# Patient Record
Sex: Male | Born: 1945
Health system: Southern US, Community
[De-identification: ages and names within clinical notes are randomized; demographics above are authoritative.]

## PROBLEM LIST (undated history)

## (undated) DIAGNOSIS — H269 Unspecified cataract: Secondary | ICD-10-CM

## (undated) DIAGNOSIS — E785 Hyperlipidemia, unspecified: Secondary | ICD-10-CM

## (undated) DIAGNOSIS — I10 Essential (primary) hypertension: Secondary | ICD-10-CM

## (undated) DIAGNOSIS — K648 Other hemorrhoids: Secondary | ICD-10-CM

## (undated) DIAGNOSIS — M1990 Unspecified osteoarthritis, unspecified site: Secondary | ICD-10-CM

## (undated) DIAGNOSIS — D126 Benign neoplasm of colon, unspecified: Secondary | ICD-10-CM

## (undated) DIAGNOSIS — E119 Type 2 diabetes mellitus without complications: Secondary | ICD-10-CM

## (undated) DIAGNOSIS — E559 Vitamin D deficiency, unspecified: Secondary | ICD-10-CM

## (undated) DIAGNOSIS — K219 Gastro-esophageal reflux disease without esophagitis: Secondary | ICD-10-CM

## (undated) DIAGNOSIS — Z8673 Personal history of transient ischemic attack (TIA), and cerebral infarction without residual deficits: Secondary | ICD-10-CM

## (undated) HISTORY — PX: UPPER GASTROINTESTINAL ENDOSCOPY: SHX188

## (undated) HISTORY — DX: Other hemorrhoids: K64.8

## (undated) HISTORY — DX: Type 2 diabetes mellitus without complications: E11.9

## (undated) HISTORY — DX: Essential (primary) hypertension: I10

## (undated) HISTORY — DX: Hyperlipidemia, unspecified: E78.5

## (undated) HISTORY — DX: Personal history of transient ischemic attack (TIA), and cerebral infarction without residual deficits: Z86.73

## (undated) HISTORY — PX: CATARACT EXTRACTION, BILATERAL: SHX1313

## (undated) HISTORY — DX: Vitamin D deficiency, unspecified: E55.9

## (undated) HISTORY — DX: Unspecified osteoarthritis, unspecified site: M19.90

## (undated) HISTORY — PX: WISDOM TOOTH EXTRACTION: SHX21

## (undated) HISTORY — DX: Unspecified cataract: H26.9

## (undated) HISTORY — DX: Gastro-esophageal reflux disease without esophagitis: K21.9

## (undated) HISTORY — DX: Benign neoplasm of colon, unspecified: D12.6

## (undated) HISTORY — PX: POLYPECTOMY: SHX149

---

## 1976-08-11 HISTORY — PX: HEMORRHOID SURGERY: SHX153

## 2006-12-18 ENCOUNTER — Ambulatory Visit: Payer: Self-pay | Admitting: Critical Care Medicine

## 2007-01-05 ENCOUNTER — Ambulatory Visit: Payer: Self-pay

## 2007-08-12 DIAGNOSIS — D126 Benign neoplasm of colon, unspecified: Secondary | ICD-10-CM

## 2007-08-12 HISTORY — DX: Benign neoplasm of colon, unspecified: D12.6

## 2008-03-17 ENCOUNTER — Ambulatory Visit: Payer: Self-pay | Admitting: Gastroenterology

## 2008-03-31 ENCOUNTER — Encounter: Payer: Self-pay | Admitting: Gastroenterology

## 2008-03-31 ENCOUNTER — Ambulatory Visit: Payer: Self-pay | Admitting: Gastroenterology

## 2008-04-03 ENCOUNTER — Encounter: Payer: Self-pay | Admitting: Gastroenterology

## 2009-05-07 ENCOUNTER — Ambulatory Visit (HOSPITAL_COMMUNITY): Admission: RE | Admit: 2009-05-07 | Discharge: 2009-05-07 | Payer: Self-pay | Admitting: General Surgery

## 2009-05-07 ENCOUNTER — Encounter (INDEPENDENT_AMBULATORY_CARE_PROVIDER_SITE_OTHER): Payer: Self-pay | Admitting: General Surgery

## 2009-07-25 LAB — HM COLONOSCOPY: HM Colonoscopy: NORMAL

## 2010-02-05 ENCOUNTER — Encounter (INDEPENDENT_AMBULATORY_CARE_PROVIDER_SITE_OTHER): Payer: Self-pay | Admitting: *Deleted

## 2010-07-18 ENCOUNTER — Encounter
Admission: RE | Admit: 2010-07-18 | Discharge: 2010-07-18 | Payer: Self-pay | Source: Home / Self Care | Attending: Family Medicine | Admitting: Family Medicine

## 2010-09-01 ENCOUNTER — Encounter: Payer: Self-pay | Admitting: Critical Care Medicine

## 2010-09-10 NOTE — Letter (Signed)
Summary: Colonoscopy Letter  [facility]  [address] [place], [place] [address]   Phone: [phone]  Fax: [phone]      February 05, 2010 MRN: [id number]   GIOVONI BUNCH [address] [place], [place]  [address]   Dear Mr. Sellen,   According to your medical record, it is time for you to schedule a Colonoscopy. The American Cancer Society recommends this procedure as a method to detect early colon cancer. Patients with a family history of colon cancer, or a personal history of colon polyps or inflammatory bowel disease are at increased risk.  This letter has beeen generated based on the recommendations made at the time of your procedure. If you feel that in your particular situation this may no longer apply, please contact our office.  Please call our office at [phone] to schedule this appointment or to update your records at your earliest convenience.  Thank you for cooperating with [place] to provide you with the very best care possible.   Sincerely,  Judie Petit T. Russella Dar, M.D.  [facility] [phone]

## 2010-11-14 ENCOUNTER — Encounter: Payer: Self-pay | Admitting: Nurse Practitioner

## 2010-11-14 DIAGNOSIS — I10 Essential (primary) hypertension: Secondary | ICD-10-CM | POA: Insufficient documentation

## 2010-11-14 DIAGNOSIS — E785 Hyperlipidemia, unspecified: Secondary | ICD-10-CM | POA: Insufficient documentation

## 2010-11-14 DIAGNOSIS — E1169 Type 2 diabetes mellitus with other specified complication: Secondary | ICD-10-CM | POA: Insufficient documentation

## 2010-11-15 LAB — DIFFERENTIAL
Basophils Absolute: 0 K/uL (ref 0.0–0.1)
Basophils Relative: 1 % (ref 0–1)
Eosinophils Absolute: 0.4 K/uL (ref 0.0–0.7)
Eosinophils Relative: 4 % (ref 0–5)
Lymphocytes Relative: 22 % (ref 12–46)
Lymphs Abs: 2 K/uL (ref 0.7–4.0)
Monocytes Absolute: 0.6 K/uL (ref 0.1–1.0)
Monocytes Relative: 7 % (ref 3–12)
Neutro Abs: 6 K/uL (ref 1.7–7.7)
Neutrophils Relative %: 67 % (ref 43–77)

## 2010-11-15 LAB — COMPREHENSIVE METABOLIC PANEL
Alkaline Phosphatase: 66 U/L (ref 39–117)
BUN: 12 mg/dL (ref 6–23)
GFR calc non Af Amer: >60 mL/min (ref >60)
Glucose, Bld: 125 mg/dL — ABNORMAL HIGH (ref 70–99)
Potassium: 4.1 mEq/L (ref 3.5–5.1)
Total Protein: 7.6 g/dL (ref 6.0–8.3)

## 2010-11-15 LAB — URINALYSIS, ROUTINE W REFLEX MICROSCOPIC
Nitrite: NEGATIVE
Specific Gravity, Urine: 1.01 (ref 1.005–1.030)
Urobilinogen, UA: 0.2 mg/dL (ref 0.0–1.0)

## 2010-11-15 LAB — CBC
HCT: 48.4 % (ref 39.0–52.0)
Hemoglobin: 16.8 g/dL (ref 13.0–17.0)
MCHC: 34.6 g/dL (ref 30.0–36.0)
RDW: 14.1 % (ref 11.5–15.5)

## 2010-12-24 NOTE — Assessment & Plan Note (Signed)
[facility]                             PULMONARY OFFICE NOTE   Jorge, Garcia                        MRN:          [id number]  DATE:12/18/2006                            DOB:          08-10-1946    CHIEF COMPLAINT:  Evaluate abnormal x-ray.   HISTORY OF PRESENT ILLNESS:  This is a 65 year old male on routine  physical found to have abnormal chest x-ray with dilated central  pulmonary arteries. He complains of dyspnea when he bends over and when  he walks uphill. He has had a 15 pound weight gain. There is no cough,  no acid symptoms, and no chest pain. He has had no nasal congestion,  sneezing, or itching. He is short of breath only going uphill. He works  as a Contractor at [employer]. He denies any  lower extremity edema. He has no daytime hypersomnolence or fatigue. He  is short of breath with exertion only, not at rest. There is no cough.  There is no irregular heartbeats, acid reflux, indigestion, or loss of  appetite. The patient denies any sore throat, headaches, nasal  congestion, sneezing, itching, ear ache, anxiety, depression, hand or  foot swelling. The patient is referred for further evaluation.   PAST MEDICAL HISTORY:  History of hypertension, hypercholesterolemia,  otherwise non-contributory. No surgical history is noted.   MEDICATION ALLERGIES:  None.   CURRENT MEDICATIONS:  1. Lotrel 5/20 daily.  2. Hydrochlorothiazide 25 mg daily.  3. Niacin daily.  4. Aspirin 81 mg daily.   SOCIAL HISTORY:  He smoked a pack a day for 40 years. He quit smoking in  2007. He lives at home with himself. 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   FAMILY HISTORY:  Paternal uncles both had cancer.   REVIEW OF SYSTEMS:  Otherwise non-contributory.   PHYSICAL EXAMINATION:  GENERAL:  This is an obese male in no distress.  VITAL SIGNS:  Temperature 97.9, weight 235 pounds, blood pressure  132/84,  pulse 83, saturation 93% on room air.  CHEST:  Clear without evidence of wheeze, rale, or rhonchi.  CARDIAC:  Regular rate and rhythm without S3, normal S1, S2.  ABDOMEN:  Soft and nontender.  EXTREMITIES:  No edema, clubbing, or venous disease.  SKIN:  Clear.  NEUROLOGIC:  Intact.  HEENT:  Showed no jugular venous distension or lymphadenopathy.   LABORATORY DATA:  Spirometry was obtained and it showed normal  spirometry with a FEV1 140% predicted, FVC of 126% predicted. Chest x-  ray was reviewed from [facility] did show  prominent right central pulmonary artery, however CAT scan of the chest  with contrast obtained today with pulmonary embolism protocol revealed  no evidence of pulmonary emboli and did not reveal evidence for  significant pulmonary hypertension.   IMPRESSION:  Prominent pulmonary arteries doubt significant given  negative CAT scan of the chest.   PLAN:  Obtain echocardiogram. Once the results of this are available,  further recommendations would follow.     Charlcie Cradle Delford Field, MD, FCCP  Electronically Signed    PEW/MedQ  DD: 12/20/2006  DT: 12/21/2006  Job #: [id number]   cc:   Gennette Pac

## 2012-04-14 ENCOUNTER — Encounter: Payer: Self-pay | Admitting: Gastroenterology

## 2012-10-26 ENCOUNTER — Encounter: Payer: Self-pay | Admitting: Family Medicine

## 2012-10-26 ENCOUNTER — Telehealth: Payer: Self-pay

## 2012-10-26 NOTE — Telephone Encounter (Signed)
Jorge Garcia needs a note to go back to work

## 2012-10-26 NOTE — Telephone Encounter (Signed)
Ok to give note 

## 2012-10-26 NOTE — Telephone Encounter (Signed)
Pt called and note up front to pick up

## 2012-10-27 ENCOUNTER — Telehealth: Payer: Self-pay | Admitting: Nurse Practitioner

## 2012-10-27 NOTE — Telephone Encounter (Signed)
Needs work note to go back to work without rrestrictions Monday

## 2012-10-28 ENCOUNTER — Encounter: Payer: Self-pay | Admitting: Nurse Practitioner

## 2012-11-18 ENCOUNTER — Ambulatory Visit (INDEPENDENT_AMBULATORY_CARE_PROVIDER_SITE_OTHER): Payer: BC Managed Care – PPO | Admitting: Nurse Practitioner

## 2012-11-18 ENCOUNTER — Encounter: Payer: Self-pay | Admitting: Nurse Practitioner

## 2012-11-18 VITALS — BP 128/78 | HR 61 | Temp 97.6°F | Ht 73.0 in | Wt 230.0 lb

## 2012-11-18 DIAGNOSIS — E559 Vitamin D deficiency, unspecified: Secondary | ICD-10-CM

## 2012-11-18 DIAGNOSIS — E119 Type 2 diabetes mellitus without complications: Secondary | ICD-10-CM

## 2012-11-18 DIAGNOSIS — E785 Hyperlipidemia, unspecified: Secondary | ICD-10-CM

## 2012-11-18 LAB — BASIC METABOLIC PANEL WITH GFR
Calcium: 9.3 mg/dL (ref 8.4–10.5)
Creat: 0.9 mg/dL (ref 0.50–1.35)
GFR, Est African American: >89 mL/min
Sodium: 139 mEq/L (ref 135–145)

## 2012-11-18 LAB — HEPATIC FUNCTION PANEL
Albumin: 4.3 g/dL (ref 3.5–5.2)
Bilirubin, Direct: 0.4 mg/dL — ABNORMAL HIGH (ref 0.0–0.3)
Total Bilirubin: 1.7 mg/dL — ABNORMAL HIGH (ref 0.3–1.2)

## 2012-11-18 LAB — POCT GLYCOSYLATED HEMOGLOBIN (HGB A1C): Hemoglobin A1C: 6.1

## 2012-11-18 NOTE — Progress Notes (Signed)
Subjective:    Patient ID: Jorge Garcia, male    DOB: 1946-06-27, 67 y.o.   MRN: [id number]  Hypertension This is a chronic problem. The current episode started more than 1 year ago. The problem is unchanged. The problem is controlled. Pertinent negatives include no blurred vision, chest pain, headaches, malaise/fatigue, peripheral edema or shortness of breath. There are no associated agents to hypertension. Risk factors for coronary artery disease include diabetes mellitus, dyslipidemia and male gender. Past treatments include ACE inhibitors and calcium channel blockers. The current treatment provides significant improvement. There are no compliance problems.  There is no history of angina.  Hyperlipidemia This is a chronic problem. The current episode started more than 1 year ago. The problem is controlled. Recent lipid tests were reviewed and are high. Exacerbating diseases include diabetes. There are no known factors aggravating his hyperlipidemia. Pertinent negatives include no chest pain, focal weakness, leg pain, myalgias or shortness of breath. Current antihyperlipidemic treatment includes statins. The current treatment provides moderate improvement of lipids. Compliance problems include adherence to diet and adherence to exercise.  Risk factors for coronary artery disease include diabetes mellitus and hypertension.  Diabetes He presents for his follow-up diabetic visit. He has type 2 diabetes mellitus. No MedicAlert identification noted. The initial diagnosis of diabetes was made 1 year ago. His disease course has been stable. Pertinent negatives for hypoglycemia include no headaches. Associated symptoms include foot ulcerations. Pertinent negatives for diabetes include no blurred vision, no chest pain, no foot paresthesias, no polydipsia, no polyphagia, no polyuria and no weakness. Symptoms are stable. Pertinent negatives for diabetic complications include no nephropathy. Risk factors for  coronary artery disease include dyslipidemia, hypertension and male sex. Current diabetic treatment includes oral agent (monotherapy). He is compliant with treatment all of the time. His weight is stable. He is following a diabetic diet. When asked about meal planning, he reported none. He has not had a previous visit with a dietician. He participates in exercise intermittently. There is no change in his home blood glucose trend. His breakfast blood glucose is taken between 8-9 am. His breakfast blood glucose range is generally 110-130 mg/dl. His overall blood glucose range is 110-130 mg/dl. An ACE inhibitor/angiotensin II receptor blocker is being taken. He does not see a podiatrist.Eye exam is current (October, 2013).      Review of Systems  Constitutional: Negative for malaise/fatigue.  Eyes: Negative for blurred vision.  Respiratory: Negative for shortness of breath.   Cardiovascular: Negative for chest pain.  Endocrine: Negative for polydipsia, polyphagia and polyuria.  Musculoskeletal: Negative for myalgias.  Neurological: Negative for focal weakness, weakness and headaches.  All other systems reviewed and are negative.       Objective:   Physical Exam  Constitutional: He is oriented to person, place, and time. He appears well-developed and well-nourished.  HENT:  Head: Normocephalic.  Right Ear: External ear normal.  Left Ear: External ear normal.  Nose: Nose normal.  Mouth/Throat: Oropharynx is clear and moist.  Eyes: EOM are normal. Pupils are equal, round, and reactive to light.  Neck: Normal range of motion. Neck supple. No thyromegaly present.  Cardiovascular: Normal rate, regular rhythm, normal heart sounds and intact distal pulses.   No murmur heard. Pulmonary/Chest: Effort normal and breath sounds normal. He has no wheezes. He has no rales.  Abdominal: Soft. Bowel sounds are normal.  Musculoskeletal: Normal range of motion.  Neurological: He is alert and oriented to  person, place, and time.  (+) 4/4 monofilament  bil  Skin: Skin is warm and dry.  No callus formation bil feet  Psychiatric: He has a normal mood and affect. His behavior is normal. Judgment and thought content normal.  BP 128/78  Pulse 61  Temp(Src) 97.6 F (36.4 C) (Oral)  Ht 6\' 1"  (1.854 m)  Wt 230 lb (104.327 kg)  BMI 30.35 kg/m2 Results for orders placed in visit on 11/18/12  POCT GLYCOSYLATED HEMOGLOBIN (HGB A1C)      Result Value Range   Hemoglobin A1C 6.1             Assessment & Plan:  1.Type II or unspecified type diabetes mellitus without mention of complication, not stated as uncontrolled Coninue low carb diet - POCT glycosylated hemoglobin (Hb A1C)  2. Unspecified vitamin D deficiency  - Vitamin D 25 hydroxy  3. Other and unspecified hyperlipidemia Low fat diet and exercise - BASIC METABOLIC PANEL WITH GFR - Hepatic function panel - NMR Lipoprofile with Lipids  Continue all meds Health Maintenance reviewed Mary-Margaret Daphine Deutscher, FNP

## 2012-11-18 NOTE — Patient Instructions (Signed)
Diets for Diabetes, Food Labeling Look at food labels to help you decide how much of a product you can eat. You will want to check the amount of total carbohydrate in a serving to see how the food fits into your meal plan. In the list of ingredients, the ingredient present in the largest amount by weight must be listed first, followed by the other ingredients in descending order. STANDARD OF IDENTITY Most products have a list of ingredients. However, foods that the Food and Drug Administration (FDA) has given a standard of identity do not need a list of ingredients. A standard of identity means that a food must contain certain ingredients if it is called a particular name. Examples are mayonnaise, peanut butter, ketchup, jelly, and cheese. LABELING TERMS There are many terms found on food labels. Some of these terms have specific definitions. Some terms are regulated by the FDA, and the FDA has clearly specified how they can be used. Others are not regulated or well-defined and can be misleading and confusing. SPECIFICALLY DEFINED TERMS Nutritive Sweetener.  A sweetener that contains calories,such as table sugar or honey. Nonnutritive Sweetener.  A sweetener with few or no calories,such as saccharin, aspartame, sucralose, and cyclamate. LABELING TERMS REGULATED BY THE FDA Free.  The product contains only a tiny or small amount of fat, cholesterol, sodium, sugar, or calories. For example, a "fat-free" product will contain less than 0.5 g of fat per serving. Low.  A food described as "low" in fat, saturated fat, cholesterol, sodium, or calories could be eaten fairly often without exceeding dietary guidelines. For example, "low in fat" means no more than 3 g of fat per serving. Lean.  "Lean" and "extra lean" are U.S. Department of Agriculture (USDA) terms for use on meat and poultry products. "Lean" means the product contains less than 10 g of fat, 4 g of saturated fat, and 95 mg of cholesterol  per serving. "Lean" is not as low in fat as a product labeled "low." Extra Lean.  "Extra lean" means the product contains less than 5 g of fat, 2 g of saturated fat, and 95 mg of cholesterol per serving. While "extra lean" has less fat than "lean," it is still higher in fat than a product labeled "low." Reduced, Less, Fewer.  A diet product that contains 25% less of a nutrient or calories than the regular version. For example, hot dogs might be labeled "25% less fat than our regular hot dogs." Light/Lite.  A diet product that contains  fewer calories or  the fat of the original. For example, "light in sodium" means a product with  the usual sodium. More.  One serving contains at least 10% more of the daily value of a vitamin, mineral, or fiber than usual. Good Source Of.  One serving contains 10% to 19% of the daily value for a particular vitamin, mineral, or fiber. Excellent Source Of.  One serving contains 20% or more of the daily value for a particular nutrient. Other terms used might be "high in" or "rich in." Enriched or Fortified.  The product contains added vitamins, minerals, or protein. Nutrition labeling must be used on enriched or fortified foods. Imitation.  The product has been altered so that it is lower in protein, vitamins, or minerals than the usual food,such as imitation peanut butter. Total Fat.  The number listed is the total of all fat found in a serving of the product. Under total fat, food labels must list saturated fat and   trans fat, which are associated with raising bad cholesterol and an increased risk of heart blood vessel disease. Saturated Fat.  Mainly fats from animal-based sources. Some examples are red meat, cheese, cream, whole milk, and coconut oil. Trans Fat.  Found in some fried snack foods, packaged foods, and fried restaurant foods. It is recommended you eat as close to 0 g of trans fat as possible, since it raises bad cholesterol and lowers  good cholesterol. Polyunsaturated and Monounsaturated Fats.  More healthful fats. These fats are from plant sources. Total Carbohydrate.  The number of carbohydrate grams in a serving of the product. Under total carbohydrate are listed the other carbohydrate sources, such as dietary fiber and sugars. Dietary Fiber.  A carbohydrate from plant sources. Sugars.  Sugars listed on the label contain all naturally occurring sugars as well as added sugars. LABELING TERMS NOT REGULATED BY THE FDA Sugarless.  Table sugar (sucrose) has not been added. However, the manufacturer may use another form of sugar in place of sucrose to sweeten the product. For example, sugar alcohols are used to sweeten foods. Sugar alcohols are a form of sugar but are not table sugar. If a product contains sugar alcohols in place of sucrose, it can still be labeled "sugarless." Low Salt, Salt-Free, Unsalted, No Salt, No Salt Added, Without Added Salt.  Food that is usually processed with salt has been made without salt. However, the food may contain sodium-containing additives, such as preservatives, leavening agents, or flavorings. Natural.  This term has no legal meaning. Organic.  Foods that are certified as organic have been inspected and approved by the USDA to ensure they are produced without pesticides, fertilizers containing synthetic ingredients, bioengineering, or ionizing radiation. Document Released: 07/31/2003 Document Revised: 10/20/2011 Document Reviewed: 02/15/2009 Exit[employer] Patient Information 2013 [employer].  

## 2012-11-19 LAB — NMR LIPOPROFILE WITH LIPIDS
Cholesterol, Total: 110 mg/dL (ref <200)
HDL Size: 8.3 nm — ABNORMAL LOW (ref >=9.2)
LDL (calc): 57 mg/dL (ref <100)
LDL Particle Number: 995 nmol/L (ref <1000)
LP-IR Score: 53 — ABNORMAL HIGH (ref <=45)
Small LDL Particle Number: 683 nmol/L — ABNORMAL HIGH (ref <=527)
Triglycerides: 52 mg/dL (ref <150)
VLDL Size: 46 nm (ref <=46.6)

## 2013-02-18 ENCOUNTER — Encounter: Payer: Self-pay | Admitting: Nurse Practitioner

## 2013-02-18 ENCOUNTER — Ambulatory Visit (INDEPENDENT_AMBULATORY_CARE_PROVIDER_SITE_OTHER): Payer: Medicare Other

## 2013-02-18 ENCOUNTER — Ambulatory Visit (INDEPENDENT_AMBULATORY_CARE_PROVIDER_SITE_OTHER): Payer: Medicare Other | Admitting: Nurse Practitioner

## 2013-02-18 VITALS — BP 137/79 | HR 68 | Temp 97.1°F | Ht 73.0 in | Wt 226.0 lb

## 2013-02-18 DIAGNOSIS — M7730 Calcaneal spur, unspecified foot: Secondary | ICD-10-CM

## 2013-02-18 DIAGNOSIS — M7732 Calcaneal spur, left foot: Secondary | ICD-10-CM

## 2013-02-18 DIAGNOSIS — E119 Type 2 diabetes mellitus without complications: Secondary | ICD-10-CM

## 2013-02-18 DIAGNOSIS — M79609 Pain in unspecified limb: Secondary | ICD-10-CM

## 2013-02-18 DIAGNOSIS — E785 Hyperlipidemia, unspecified: Secondary | ICD-10-CM

## 2013-02-18 DIAGNOSIS — M79672 Pain in left foot: Secondary | ICD-10-CM

## 2013-02-18 DIAGNOSIS — I10 Essential (primary) hypertension: Secondary | ICD-10-CM

## 2013-02-18 LAB — POCT UA - MICROALBUMIN: Microalbumin Ur, POC: NEGATIVE mg/L

## 2013-02-18 LAB — COMPLETE METABOLIC PANEL WITH GFR
Alkaline Phosphatase: 58 U/L (ref 39–117)
CO2: 26 mEq/L (ref 19–32)
Creat: 1.05 mg/dL (ref 0.50–1.35)
GFR, Est African American: 86 mL/min
GFR, Est Non African American: 75 mL/min
Glucose, Bld: 87 mg/dL (ref 70–99)
Sodium: 141 mEq/L (ref 135–145)
Total Bilirubin: 2.4 mg/dL — ABNORMAL HIGH (ref 0.3–1.2)
Total Protein: 6.9 g/dL (ref 6.0–8.3)

## 2013-02-18 LAB — POCT GLYCOSYLATED HEMOGLOBIN (HGB A1C): Hemoglobin A1C: 5.8

## 2013-02-18 MED ORDER — BUPIVACAINE HCL 0.5 % IJ SOLN
50.0000 mL | Freq: Once | INTRAMUSCULAR | Status: AC
  Administered 2013-02-18: 50 mL

## 2013-02-18 MED ORDER — METFORMIN HCL 500 MG PO TABS: 500.0000 mg | ORAL_TABLET | Freq: Two times a day (BID) | ORAL | Status: DC

## 2013-02-18 MED ORDER — METHYLPREDNISOLONE ACETATE 40 MG/ML IJ SUSP
40.0000 mg | Freq: Once | INTRAMUSCULAR | Status: AC
  Administered 2013-02-18: 40 mg via INTRALESIONAL

## 2013-02-18 MED ORDER — ROSUVASTATIN CALCIUM 10 MG PO TABS: 10.0000 mg | ORAL_TABLET | Freq: Every day | ORAL | Status: DC

## 2013-02-18 MED ORDER — HYDROCHLOROTHIAZIDE 25 MG PO TABS: 25.0000 mg | ORAL_TABLET | Freq: Every day | ORAL | Status: DC

## 2013-02-18 NOTE — Patient Instructions (Signed)
Heel Spur A heel spur is a hook of bone that can form on the calcaneus (the heel bone and the largest bone of the foot). Heel spurs are often associated with plantar fasciitis and usually come in people who have had the problem for an extended period of time. The cause of the relationship is unknown. The pain associated with them is thought to be caused by an inflammation (soreness and redness) of the plantar fascia rather than the spur itself. The plantar fascia is a thick fibrous like tissue that runs from the calcaneus (heel bone) to the ball of the foot. This strong, tight tissue helps maintain the arch of your foot. It helps distribute the weight across your foot as you walk or run. Stresses placed on the plantar fascia can be tremendous. When it is inflamed normal activities become painful. Pain is worse in the morning after sleeping. After sleeping the plantar fascia is tight. The first movements stretch the fascia and this causes pain. As the tendon loosens, the pain usually gets better. It often returns with too much standing or walking.  About 70% of patients with plantar fasciitis have a heel spur. About half of people without foot pain also have heel spurs. DIAGNOSIS  The diagnosis of a heel spur is made by X-ray. The X-ray shows a hook of bone protruding from the bottom of the calcaneus at the point where the plantar fascia is attached to the heel bone.  TREATMENT  It is necessary to find out what is causing the stretching of the plantar fascia. If the cause is over-pronation (flat feet), orthotics and proper foot ware may help.  Stretching exercises, losing weight, wearing shoes that have a cushioned heel that absorbs shock, and elevating the heel with the use of a heel cradle, heel cup, or orthotics may all help. Heel cradles and heel cups provide extra comfort and cushion to the heel, and reduce the amount of shock to the sore area. AVOIDING THE PAIN OF PLANTAR FASCIITIS AND HEEL  SPURS  Consult a sports medicine professional before beginning a new exercise program.  Walking programs offer a good workout. There is a lower chance of overuse injuries common to the runners. There is less impact and less jarring of the joints.  Begin all new exercise programs slowly. If problems or pains develop, decrease the amount of time or distance until you are at a comfortable level.  Wear good shoes and replace them regularly.  Stretch your foot and the heel cords at the back of the ankle (Achilles tendons) both before and after exercise.  Run or exercise on even surfaces that are not hard. For example, asphalt is better than pavement.  Do not run barefoot on hard surfaces.  If using a treadmill, vary the incline.  Do not continue to workout if you have foot or joint problems. Seek professional help if they do not improve. HOME CARE INSTRUCTIONS   Avoid activities that cause you pain until you recover.  Use ice or cold packs to the problem or painful areas after working out.  Only take over-the-counter or prescription medicines for pain, discomfort, or fever as directed by your caregiver.  Soft shoe inserts or athletic shoes with air or gel sole cushions may be helpful.  If problems continue or become more severe, consult a sports medicine caregiver. Cortisone is a potent anti-inflammatory medication that may be injected into the painful area. You can discuss this treatment with your caregiver. MAKE SURE YOU:     Understand these instructions.  Will watch your condition.  Will get help right away if you are not doing well or get worse. Document Released: 09/03/2005 Document Revised: 10/20/2011 Document Reviewed: 11/05/2005 Exit[employer] Patient Information 2014 [employer].  

## 2013-02-18 NOTE — Progress Notes (Signed)
Subjective:    Patient ID: Jorge Garcia, male    DOB: 1945/12/25, 67 y.o.   MRN: [id number]  Hypertension This is a chronic problem. The current episode started more than 1 year ago. The problem is unchanged. The problem is controlled. Pertinent negatives include no blurred vision, chest pain, headaches, neck pain, palpitations, peripheral edema, shortness of breath or sweats. There are no associated agents to hypertension. Risk factors for coronary artery disease include diabetes mellitus, dyslipidemia and male gender. Past treatments include ACE inhibitors, calcium channel blockers and diuretics. The current treatment provides significant improvement. Compliance problems include diet and exercise.  There is no history of CAD/MI or CVA.  Hyperlipidemia This is a recurrent problem. The current episode started more than 1 year ago. The problem is controlled. Recent lipid tests were reviewed and are low. Exacerbating diseases include diabetes. He has no history of hypothyroidism, liver disease or obesity. Factors aggravating his hyperlipidemia include thiazides. Pertinent negatives include no chest pain or shortness of breath. Current antihyperlipidemic treatment includes statins. The current treatment provides moderate improvement of lipids. Compliance problems include adherence to diet and adherence to exercise.  Risk factors for coronary artery disease include diabetes mellitus, hypertension and male sex.  Diabetes He presents for his follow-up diabetic visit. He has type 2 diabetes mellitus. No MedicAlert identification noted. The initial diagnosis of diabetes was made 2 years ago. His disease course has been fluctuating. There are no hypoglycemic associated symptoms. Pertinent negatives for hypoglycemia include no headaches or sweats. Pertinent negatives for diabetes include no blurred vision and no chest pain. Symptoms are improving. Pertinent negatives for diabetic complications include no CVA. Risk  factors for coronary artery disease include dyslipidemia and hypertension. When asked about current treatments, none were reported. He is compliant with treatment most of the time. His weight is stable. When asked about meal planning, he reported none. He has not had a previous visit with a dietician. He rarely participates in exercise. There is no change in his home blood glucose trend. His breakfast blood glucose is taken between 8-9 am. His breakfast blood glucose range is generally 110-130 mg/dl. His overall blood glucose range is 110-130 mg/dl. An ACE inhibitor/angiotensin II receptor blocker is not being taken. He does not see a podiatrist.Eye exam is current.    8 C/O left heel pian- has history of heel spur right heel  Review of Systems  HENT: Negative for neck pain.   Eyes: Negative for blurred vision.  Respiratory: Negative for shortness of breath.   Cardiovascular: Negative for chest pain and palpitations.  Neurological: Negative for headaches.  All other systems reviewed and are negative.       Objective:   Physical Exam  Constitutional: He is oriented to person, place, and time. He appears well-developed and well-nourished.  HENT:  Head: Normocephalic.  Right Ear: External ear normal.  Left Ear: External ear normal.  Nose: Nose normal.  Mouth/Throat: Oropharynx is clear and moist.  Eyes: EOM are normal. Pupils are equal, round, and reactive to light.  Neck: Normal range of motion. Neck supple. No thyromegaly present.  Cardiovascular: Normal rate, regular rhythm, normal heart sounds and intact distal pulses.   No murmur heard. Pulmonary/Chest: Effort normal and breath sounds normal. He has no wheezes. He has no rales.  Abdominal: Soft. Bowel sounds are normal.  Musculoskeletal: Normal range of motion.  Neurological: He is alert and oriented to person, place, and time.  Skin: Skin is warm and dry.  Psychiatric: He has  a normal mood and affect. His behavior is normal.  Judgment and thought content normal.      BP 137/79  Pulse 68  Temp(Src) 97.1 F (36.2 C) (Oral)  Ht 6\' 1"  (1.854 m)  Wt 226 lb (102.513 kg)  BMI 29.82 kg/m2    Results for orders placed in visit on 02/18/13  POCT GLYCOSYLATED HEMOGLOBIN (HGB A1C)      Result Value Range   Hemoglobin A1C 5.8      left foot xray- left heel spur-Preliminary reading by Paulene Floor, FNP  [facility]  Procedure: Ethyl Chloride spray  Betadine prep Marcaine 0.5 % plain 1ml with depomedrol 40mg /ml-49ml injectes 20g 1 1/2 needle    Assessment & Plan:   1. Type II or unspecified type diabetes mellitus without mention of complication, not stated as uncontrolled   2. Hypertension   3. Hyperlipidemia   4. Heel pain, left   5.   Heel spur,left Orders Placed This Encounter  Procedures  . DG Foot Complete Left    Standing Status: Future     Number of Occurrences:      Standing Expiration Date: 04/20/2014    Order Specific Question:  Reason for Exam (SYMPTOM  OR DIAGNOSIS REQUIRED)    Answer:  left heel pain    Order Specific Question:  Preferred imaging location?    Answer:  Internal  . COMPLETE METABOLIC PANEL WITH GFR  . NMR Lipoprofile with Lipids  . POCT glycosylated hemoglobin (Hb A1C)   Outpatient Encounter Prescriptions as of 02/18/2013  Medication Sig Dispense Refill  . amLODipine (NORVASC) 10 MG tablet Take 10 mg by mouth daily. 1/2qd      . aspirin EC 81 MG tablet Take 81 mg by mouth daily.      . benazepril (LOTENSIN) 40 MG tablet Take 40 mg by mouth daily. Taking 1/2qd      . cholecalciferol (VITAMIN D) 1000 UNITS tablet Take 1,000 Units by mouth daily.      . fish oil-omega-3 fatty acids 1000 MG capsule Take 1 g by mouth daily.        . hydrochlorothiazide (HYDRODIURIL) 25 MG tablet Take 1 tablet (25 mg total) by mouth daily.  30 tablet  5  . metFORMIN (GLUCOPHAGE) 500 MG tablet Take 1 tablet (500 mg total) by mouth 2 (two) times daily with a meal. Taking 1/2qd  60 tablet  5  . rosuvastatin  (CRESTOR) 10 MG tablet Take 1 tablet (10 mg total) by mouth daily.  30 tablet  5  . [DISCONTINUED] CRESTOR 10 MG tablet       . [DISCONTINUED] hydrochlorothiazide 25 MG tablet Take 25 mg by mouth daily.        . [DISCONTINUED] metFORMIN (GLUCOPHAGE) 500 MG tablet Take 500 mg by mouth 2 (two) times daily with a meal. Taking 1/2qd       No facility-administered encounter medications on file as of 02/18/2013.   Continue all meds Labs pending Follow up in 3 months Heel cushion for shoes Mary-Margaret Daphine Deutscher, FNP

## 2013-02-22 LAB — NMR LIPOPROFILE WITH LIPIDS
HDL Size: 9.1 nm — ABNORMAL LOW (ref >=9.2)
HDL-C: 41 mg/dL (ref >=40)
LDL (calc): 39 mg/dL (ref <100)
LDL Size: 20 nm — ABNORMAL LOW (ref >20.5)
LP-IR Score: 53 — ABNORMAL HIGH (ref <=45)
Large HDL-P: 5.2 umol/L (ref >=4.8)
Triglycerides: 68 mg/dL (ref <150)
VLDL Size: 47.9 nm — ABNORMAL HIGH (ref <=46.6)

## 2013-03-07 ENCOUNTER — Other Ambulatory Visit: Payer: Self-pay | Admitting: Nurse Practitioner

## 2013-04-06 ENCOUNTER — Encounter: Payer: Self-pay | Admitting: *Deleted

## 2013-05-23 ENCOUNTER — Ambulatory Visit: Payer: Medicare Other | Admitting: Nurse Practitioner

## 2013-05-26 ENCOUNTER — Ambulatory Visit: Payer: Medicare Other | Admitting: Nurse Practitioner

## 2013-05-27 ENCOUNTER — Encounter: Payer: Self-pay | Admitting: Nurse Practitioner

## 2013-05-27 ENCOUNTER — Other Ambulatory Visit: Payer: Self-pay | Admitting: Nurse Practitioner

## 2013-05-27 ENCOUNTER — Encounter (INDEPENDENT_AMBULATORY_CARE_PROVIDER_SITE_OTHER): Payer: Self-pay

## 2013-05-27 ENCOUNTER — Ambulatory Visit (INDEPENDENT_AMBULATORY_CARE_PROVIDER_SITE_OTHER): Payer: Medicare Other | Admitting: Nurse Practitioner

## 2013-05-27 VITALS — BP 136/71 | HR 66 | Temp 97.9°F | Ht 73.0 in | Wt 232.0 lb

## 2013-05-27 DIAGNOSIS — Z23 Encounter for immunization: Secondary | ICD-10-CM

## 2013-05-27 DIAGNOSIS — E785 Hyperlipidemia, unspecified: Secondary | ICD-10-CM

## 2013-05-27 DIAGNOSIS — I10 Essential (primary) hypertension: Secondary | ICD-10-CM

## 2013-05-27 DIAGNOSIS — E119 Type 2 diabetes mellitus without complications: Secondary | ICD-10-CM

## 2013-05-27 DIAGNOSIS — Z125 Encounter for screening for malignant neoplasm of prostate: Secondary | ICD-10-CM

## 2013-05-27 LAB — POCT GLYCOSYLATED HEMOGLOBIN (HGB A1C): Hemoglobin A1C: 5.8

## 2013-05-27 MED ORDER — GLUCOSE BLOOD VI STRP: ORAL_STRIP | Status: DC

## 2013-05-27 NOTE — Progress Notes (Signed)
Subjective:    Patient ID: Jorge Garcia, male    DOB: 1946/07/23, 66 y.o.   MRN: [id number]  Hypertension This is a chronic problem. The current episode started more than 1 year ago. The problem has been resolved since onset. The problem is controlled. Pertinent negatives include no blurred vision, chest pain, headaches, neck pain, palpitations, peripheral edema, shortness of breath or sweats. There are no associated agents to hypertension. Risk factors for coronary artery disease include diabetes mellitus, dyslipidemia and male gender. Past treatments include ACE inhibitors, calcium channel blockers and diuretics. The current treatment provides significant improvement. Compliance problems include diet and exercise.  There is no history of CAD/MI or CVA. There is no history of sleep apnea.  Hyperlipidemia This is a recurrent problem. The current episode started more than 1 year ago. The problem is controlled. Recent lipid tests were reviewed and are low. Exacerbating diseases include diabetes. He has no history of hypothyroidism, liver disease or obesity. Factors aggravating his hyperlipidemia include thiazides. Pertinent negatives include no chest pain or shortness of breath. Current antihyperlipidemic treatment includes statins. The current treatment provides moderate improvement of lipids. Compliance problems include adherence to diet and adherence to exercise.  Risk factors for coronary artery disease include diabetes mellitus, hypertension and male sex.  Diabetes He presents for his follow-up diabetic visit. He has type 2 diabetes mellitus. No MedicAlert identification noted. The initial diagnosis of diabetes was made 2 years ago. His disease course has been fluctuating. There are no hypoglycemic associated symptoms. Pertinent negatives for hypoglycemia include no headaches or sweats. Pertinent negatives for diabetes include no blurred vision, no chest pain and no visual change. Symptoms are  improving. There are no diabetic complications. Pertinent negatives for diabetic complications include no CVA. Risk factors for coronary artery disease include dyslipidemia, hypertension, male sex and family history. Current diabetic treatment includes oral agent (monotherapy). He is compliant with treatment most of the time. His weight is stable. He is following a generally healthy diet. When asked about meal planning, he reported none. He has not had a previous visit with a dietician. He rarely participates in exercise. There is no change in his home blood glucose trend. His breakfast blood glucose is taken between 8-9 am. His breakfast blood glucose range is generally 90-110 mg/dl. His overall blood glucose range is 110-130 mg/dl. An ACE inhibitor/angiotensin II receptor blocker is being taken. He does not see a podiatrist.Eye exam is current (Sept 15, 2014).     Review of Systems  Eyes: Negative for blurred vision.  Respiratory: Negative for shortness of breath.   Cardiovascular: Negative for chest pain and palpitations.  Musculoskeletal: Negative for neck pain.  Neurological: Negative for headaches.  All other systems reviewed and are negative.       Objective:   Physical Exam  Constitutional: He is oriented to person, place, and time. He appears well-developed and well-nourished.  HENT:  Head: Normocephalic.  Right Ear: External ear normal.  Left Ear: External ear normal.  Nose: Nose normal.  Mouth/Throat: Oropharynx is clear and moist.  Eyes: EOM are normal. Pupils are equal, round, and reactive to light.  Neck: Normal range of motion. Neck supple. No thyromegaly present.  Cardiovascular: Normal rate, regular rhythm, normal heart sounds and intact distal pulses.   No murmur heard. Pulmonary/Chest: Effort normal and breath sounds normal. He has no wheezes. He has no rales.  Abdominal: Soft. Bowel sounds are normal.  Musculoskeletal: Normal range of motion.  Neurological: He is  alert  and oriented to person, place, and time.  Skin: Skin is warm and dry.  Psychiatric: He has a normal mood and affect. His behavior is normal. Judgment and thought content normal.      BP 136/71  Pulse 66  Temp(Src) 97.9 F (36.6 C) (Oral)  Ht 6\' 1"  (1.854 m)  Wt 232 lb (105.235 kg)  BMI 30.62 kg/m2    Results for orders placed in visit on 05/27/13  POCT GLYCOSYLATED HEMOGLOBIN (HGB A1C)      Result Value Range   Hemoglobin A1C 5.8%         Assessment & Plan:   1. Type II or unspecified type diabetes mellitus without mention of complication, not stated as uncontrolled   2. Hypertension   3. Hyperlipidemia    Orders Placed This Encounter  Procedures  . CMP14+EGFR  . NMR, lipoprofile  . POCT glycosylated hemoglobin (Hb A1C)   PSA level  Continue all meds Labs pending Diet and exercise encouraged Health maintenance reviewed Follow up in 3 months prevnar vaccine  Mary-Margaret Daphine Deutscher, FNP

## 2013-05-27 NOTE — Patient Instructions (Signed)
Health Maintenance, Males A healthy lifestyle and preventative care can promote health and wellness.  Maintain regular health, dental, and eye exams.  Eat a healthy diet. Foods like vegetables, fruits, whole grains, low-fat dairy products, and lean protein foods contain the nutrients you need without too many calories. Decrease your intake of foods high in solid fats, added sugars, and salt. Get information about a proper diet from your caregiver, if necessary.  Regular physical exercise is one of the most important things you can do for your health. Most adults should get at least 150 minutes of moderate-intensity exercise (any activity that increases your heart rate and causes you to sweat) each week. In addition, most adults need muscle-strengthening exercises on 2 or more days a week.   Maintain a healthy weight. The body mass index (BMI) is a screening tool to identify possible weight problems. It provides an estimate of body fat based on height and weight. Your caregiver can help determine your BMI, and can help you achieve or maintain a healthy weight. For adults 20 years and older:  A BMI below 18.5 is considered underweight.  A BMI of 18.5 to 24.9 is normal.  A BMI of 25 to 29.9 is considered overweight.  A BMI of 30 and above is considered obese.  Maintain normal blood lipids and cholesterol by exercising and minimizing your intake of saturated fat. Eat a balanced diet with plenty of fruits and vegetables. Blood tests for lipids and cholesterol should begin at age 20 and be repeated every 5 years. If your lipid or cholesterol levels are high, you are over 50, or you are a high risk for heart disease, you may need your cholesterol levels checked more frequently.Ongoing high lipid and cholesterol levels should be treated with medicines, if diet and exercise are not effective.  If you smoke, find out from your caregiver how to quit. If you do not use tobacco, do not start.  If you  choose to drink alcohol, do not exceed 2 drinks per day. One drink is considered to be 12 ounces (355 mL) of beer, 5 ounces (148 mL) of wine, or 1.5 ounces (44 mL) of liquor.  Avoid use of street drugs. Do not share needles with anyone. Ask for help if you need support or instructions about stopping the use of drugs.  High blood pressure causes heart disease and increases the risk of stroke. Blood pressure should be checked at least every 1 to 2 years. Ongoing high blood pressure should be treated with medicines if weight loss and exercise are not effective.  If you are 45 to 65 years old, ask your caregiver if you should take aspirin to prevent heart disease.  Diabetes screening involves taking a blood sample to check your fasting blood sugar level. This should be done once every 3 years, after age 45, if you are within normal weight and without risk factors for diabetes. Testing should be considered at a younger age or be carried out more frequently if you are overweight and have at least 1 risk factor for diabetes.  Colorectal cancer can be detected and often prevented. Most routine colorectal cancer screening begins at the age of 50 and continues through age 75. However, your caregiver may recommend screening at an earlier age if you have risk factors for colon cancer. On a yearly basis, your caregiver may provide home test kits to check for hidden blood in the stool. Use of a small camera at the end of a tube,   to directly examine the colon (sigmoidoscopy or colonoscopy), can detect the earliest forms of colorectal cancer. Talk to your caregiver about this at age 50, when routine screening begins. Direct examination of the colon should be repeated every 5 to 10 years through age 75, unless early forms of pre-cancerous polyps or small growths are found.  Hepatitis C blood testing is recommended for all people born from 1945 through 1965 and any individual with known risks for hepatitis C.  Healthy  men should no longer receive prostate-specific antigen (PSA) blood tests as part of routine cancer screening. Consult with your caregiver about prostate cancer screening.  Testicular cancer screening is not recommended for adolescents or adult males who have no symptoms. Screening includes self-exam, caregiver exam, and other screening tests. Consult with your caregiver about any symptoms you have or any concerns you have about testicular cancer.  Practice safe sex. Use condoms and avoid high-risk sexual practices to reduce the spread of sexually transmitted infections (STIs).  Use sunscreen with a sun protection factor (SPF) of 30 or greater. Apply sunscreen liberally and repeatedly throughout the day. You should seek shade when your shadow is shorter than you. Protect yourself by wearing long sleeves, pants, a wide-brimmed hat, and sunglasses year round, whenever you are outdoors.  Notify your caregiver of new moles or changes in moles, especially if there is a change in shape or color. Also notify your caregiver if a mole is larger than the size of a pencil eraser.  A one-time screening for abdominal aortic aneurysm (AAA) and surgical repair of large AAAs by sound wave imaging (ultrasonography) is recommended for ages 65 to 75 years who are current or former smokers.  Stay current with your immunizations. Document Released: 01/24/2008 Document Revised: 10/20/2011 Document Reviewed: 12/23/2010 [employer] Patient Information 2014 [employer].  

## 2013-05-27 NOTE — Addendum Note (Signed)
Addended by: Azalee Course on: 05/27/2013 11:32 AM   Modules accepted: Orders

## 2013-05-28 LAB — CMP14+EGFR
ALT: 29 IU/L (ref 0–44)
AST: 36 IU/L (ref 0–40)
Alkaline Phosphatase: 62 IU/L (ref 39–117)
BUN/Creatinine Ratio: 21 (ref 10–22)
CO2: 23 mmol/L (ref 18–29)
Calcium: 9.6 mg/dL (ref 8.6–10.2)
Chloride: 101 mmol/L (ref 97–108)
Creatinine, Ser: 1.01 mg/dL (ref 0.76–1.27)
Globulin, Total: 1.9 g/dL (ref 1.5–4.5)
Potassium: 4.2 mmol/L (ref 3.5–5.2)
Sodium: 142 mmol/L (ref 134–144)

## 2013-05-28 LAB — PSA, TOTAL AND FREE: PSA, Free Pct: 37.8 %

## 2013-05-28 LAB — NMR, LIPOPROFILE
HDL Cholesterol by NMR: 44 mg/dL (ref >=40)
HDL Particle Number: 36.5 umol/L (ref >=30.5)
LDLC SERPL CALC-MCNC: 56 mg/dL (ref <100)
LP-IR Score: 55 — ABNORMAL HIGH (ref <=45)

## 2013-06-15 ENCOUNTER — Encounter: Payer: Self-pay | Admitting: *Deleted

## 2013-06-15 NOTE — Progress Notes (Signed)
Quick Note:  Copy of labs sent to patient ______ 

## 2013-08-03 ENCOUNTER — Telehealth: Payer: Self-pay | Admitting: Nurse Practitioner

## 2013-08-03 NOTE — Telephone Encounter (Signed)
appt scheduled for heel pain

## 2013-08-09 ENCOUNTER — Ambulatory Visit (INDEPENDENT_AMBULATORY_CARE_PROVIDER_SITE_OTHER): Payer: Medicare Other | Admitting: Family Medicine

## 2013-08-09 ENCOUNTER — Encounter: Payer: Self-pay | Admitting: Family Medicine

## 2013-08-09 VITALS — BP 142/73 | HR 74 | Temp 98.4°F | Ht 73.0 in | Wt 241.0 lb

## 2013-08-09 DIAGNOSIS — M79672 Pain in left foot: Secondary | ICD-10-CM

## 2013-08-09 DIAGNOSIS — M79609 Pain in unspecified limb: Secondary | ICD-10-CM

## 2013-08-09 NOTE — Progress Notes (Signed)
   Subjective:    Patient ID: Chinedu Agustin, male    DOB: 09/11/1945, 67 y.o.   MRN: [id number]  HPI This 67 y.o. male presents for evaluation of left heel pain.  He has hx of Heel spurs.   Review of Systems C/o left heel spur   No chest pain, SOB, HA, dizziness, vision change, N/V, diarrhea, constipation, dysuria, urinary urgency or frequency, myalgias, arthralgias or rash.  Objective:   Physical Exam   Vital signs noted  Well developed well nourished male.  HEENT - Head atraumatic Normocephalic Respiratory - Lungs CTA bilateral Cardiac - RRR S1 and S2 without murmur MS - TTP left heel.  Procedure - Left heel medial aspect prepped with Etoh and then 2 cc's of lidocaine and one cc of kenalog injected into left heel and patient tolerates well and reports relief of pain.      Assessment & Plan:  Heel pain, left Injection of left heel accomplished and recommend if not helping refer to orthopedics.  Deatra Canter FNP

## 2013-08-09 NOTE — Patient Instructions (Signed)
Heel Spur A heel spur is a hook of bone that can form on the calcaneus (the heel bone and the largest bone of the foot). Heel spurs are often associated with plantar fasciitis and usually come in people who have had the problem for an extended period of time. The cause of the relationship is unknown. The pain associated with them is thought to be caused by an inflammation (soreness and redness) of the plantar fascia rather than the spur itself. The plantar fascia is a thick fibrous like tissue that runs from the calcaneus (heel bone) to the ball of the foot. This strong, tight tissue helps maintain the arch of your foot. It helps distribute the weight across your foot as you walk or run. Stresses placed on the plantar fascia can be tremendous. When it is inflamed normal activities become painful. Pain is worse in the morning after sleeping. After sleeping the plantar fascia is tight. The first movements stretch the fascia and this causes pain. As the tendon loosens, the pain usually gets better. It often returns with too much standing or walking.  About 70% of patients with plantar fasciitis have a heel spur. About half of people without foot pain also have heel spurs. DIAGNOSIS  The diagnosis of a heel spur is made by X-ray. The X-ray shows a hook of bone protruding from the bottom of the calcaneus at the point where the plantar fascia is attached to the heel bone.  TREATMENT  It is necessary to find out what is causing the stretching of the plantar fascia. If the cause is over-pronation (flat feet), orthotics and proper foot ware may help.  Stretching exercises, losing weight, wearing shoes that have a cushioned heel that absorbs shock, and elevating the heel with the use of a heel cradle, heel cup, or orthotics may all help. Heel cradles and heel cups provide extra comfort and cushion to the heel, and reduce the amount of shock to the sore area. AVOIDING THE PAIN OF PLANTAR FASCIITIS AND HEEL  SPURS  Consult a sports medicine professional before beginning a new exercise program.  Walking programs offer a good workout. There is a lower chance of overuse injuries common to the runners. There is less impact and less jarring of the joints.  Begin all new exercise programs slowly. If problems or pains develop, decrease the amount of time or distance until you are at a comfortable level.  Wear good shoes and replace them regularly.  Stretch your foot and the heel cords at the back of the ankle (Achilles tendons) both before and after exercise.  Run or exercise on even surfaces that are not hard. For example, asphalt is better than pavement.  Do not run barefoot on hard surfaces.  If using a treadmill, vary the incline.  Do not continue to workout if you have foot or joint problems. Seek professional help if they do not improve. HOME CARE INSTRUCTIONS   Avoid activities that cause you pain until you recover.  Use ice or cold packs to the problem or painful areas after working out.  Only take over-the-counter or prescription medicines for pain, discomfort, or fever as directed by your caregiver.  Soft shoe inserts or athletic shoes with air or gel sole cushions may be helpful.  If problems continue or become more severe, consult a sports medicine caregiver. Cortisone is a potent anti-inflammatory medication that may be injected into the painful area. You can discuss this treatment with your caregiver. MAKE SURE YOU:     Understand these instructions.  Will watch your condition.  Will get help right away if you are not doing well or get worse. Document Released: 09/03/2005 Document Revised: 10/20/2011 Document Reviewed: 11/05/2005 Exit[employer] Patient Information 2014 [employer].  

## 2013-08-29 ENCOUNTER — Encounter: Payer: Self-pay | Admitting: Nurse Practitioner

## 2013-08-29 ENCOUNTER — Telehealth: Payer: Self-pay | Admitting: Nurse Practitioner

## 2013-08-29 ENCOUNTER — Ambulatory Visit (INDEPENDENT_AMBULATORY_CARE_PROVIDER_SITE_OTHER): Payer: Medicare HMO | Admitting: Nurse Practitioner

## 2013-08-29 VITALS — BP 147/85 | HR 75 | Temp 97.8°F | Ht 73.0 in | Wt 240.0 lb

## 2013-08-29 DIAGNOSIS — E785 Hyperlipidemia, unspecified: Secondary | ICD-10-CM

## 2013-08-29 DIAGNOSIS — I10 Essential (primary) hypertension: Secondary | ICD-10-CM

## 2013-08-29 DIAGNOSIS — E119 Type 2 diabetes mellitus without complications: Secondary | ICD-10-CM

## 2013-08-29 LAB — POCT GLYCOSYLATED HEMOGLOBIN (HGB A1C): Hemoglobin A1C: 6.1

## 2013-08-29 MED ORDER — ROSUVASTATIN CALCIUM 10 MG PO TABS: 10.0000 mg | ORAL_TABLET | Freq: Every day | ORAL | Status: DC

## 2013-08-29 MED ORDER — METFORMIN HCL 500 MG PO TABS: 500.0000 mg | ORAL_TABLET | Freq: Two times a day (BID) | ORAL | Status: DC

## 2013-08-29 MED ORDER — HYDROCHLOROTHIAZIDE 25 MG PO TABS: 25.0000 mg | ORAL_TABLET | Freq: Every day | ORAL | Status: DC

## 2013-08-29 MED ORDER — AMLODIPINE BESYLATE 10 MG PO TABS: 10.0000 mg | ORAL_TABLET | Freq: Every day | ORAL | Status: DC

## 2013-08-29 MED ORDER — BENAZEPRIL HCL 40 MG PO TABS: 40.0000 mg | ORAL_TABLET | Freq: Every day | ORAL | Status: DC

## 2013-08-29 NOTE — Telephone Encounter (Signed)
Patient takes 1/2 tablet daily of 40 mg

## 2013-08-29 NOTE — Patient Instructions (Signed)
Diabetes and Foot Care Diabetes may cause you to have problems because of poor blood supply (circulation) to your feet and legs. This may cause the skin on your feet to become thinner, break easier, and heal more slowly. Your skin may become dry, and the skin may peel and crack. You may also have nerve damage in your legs and feet causing decreased feeling in them. You may not notice minor injuries to your feet that could lead to infections or more serious problems. Taking care of your feet is one of the most important things you can do for yourself.  HOME CARE INSTRUCTIONS  Wear shoes at all times, even in the house. Do not go barefoot. Bare feet are easily injured.  Check your feet daily for blisters, cuts, and redness. If you cannot see the bottom of your feet, use a mirror or ask someone for help.  Wash your feet with warm water (do not use hot water) and mild soap. Then pat your feet and the areas between your toes until they are completely dry. Do not soak your feet as this can dry your skin.  Apply a moisturizing lotion or petroleum jelly (that does not contain alcohol and is unscented) to the skin on your feet and to dry, brittle toenails. Do not apply lotion between your toes.  Trim your toenails straight across. Do not dig under them or around the cuticle. File the edges of your nails with an emery board or nail file.  Do not cut corns or calluses or try to remove them with medicine.  Wear clean socks or stockings every day. Make sure they are not too tight. Do not wear knee-high stockings since they may decrease blood flow to your legs.  Wear shoes that fit properly and have enough cushioning. To break in new shoes, wear them for just a few hours a day. This prevents you from injuring your feet. Always look in your shoes before you put them on to be sure there are no objects inside.  Do not cross your legs. This may decrease the blood flow to your feet.  If you find a minor scrape,  cut, or break in the skin on your feet, keep it and the skin around it clean and dry. These areas may be cleansed with mild soap and water. Do not cleanse the area with peroxide, alcohol, or iodine.  When you remove an adhesive bandage, be sure not to damage the skin around it.  If you have a wound, look at it several times a day to make sure it is healing.  Do not use heating pads or hot water bottles. They may burn your skin. If you have lost feeling in your feet or legs, you may not know it is happening until it is too late.  Make sure your health care provider performs a complete foot exam at least annually or more often if you have foot problems. Report any cuts, sores, or bruises to your health care provider immediately. SEEK MEDICAL CARE IF:   You have an injury that is not healing.  You have cuts or breaks in the skin.  You have an ingrown nail.  You notice redness on your legs or feet.  You feel burning or tingling in your legs or feet.  You have pain or cramps in your legs and feet.  Your legs or feet are numb.  Your feet always feel cold. SEEK IMMEDIATE MEDICAL CARE IF:   There is increasing redness,   swelling, or pain in or around a wound.  There is a red line that goes up your leg.  Pus is coming from a wound.  You develop a fever or as directed by your health care provider.  You notice a bad smell coming from an ulcer or wound. Document Released: 07/25/2000 Document Revised: 03/30/2013 Document Reviewed: 01/04/2013 [employer] Patient Information 2014 [employer].  

## 2013-08-29 NOTE — Telephone Encounter (Signed)
[facility] aware

## 2013-08-29 NOTE — Progress Notes (Signed)
Subjective:    Patient ID: Jorge Garcia, male    DOB: 20-Jul-1946, 68 y.o.   MRN: [id number]  Patient  Here today for follow-up of chronic medical problems- no changes since last visit.  Hypertension This is a chronic problem. The current episode started more than 1 year ago. The problem has been resolved since onset. The problem is controlled. Pertinent negatives include no blurred vision, chest pain, headaches, neck pain, palpitations, peripheral edema, shortness of breath or sweats. There are no associated agents to hypertension. Risk factors for coronary artery disease include diabetes mellitus, dyslipidemia and male gender. Past treatments include ACE inhibitors, calcium channel blockers and diuretics. The current treatment provides significant improvement. Compliance problems include diet and exercise.  There is no history of CAD/MI or CVA. There is no history of sleep apnea.  Hyperlipidemia This is a recurrent problem. The current episode started more than 1 year ago. The problem is controlled. Recent lipid tests were reviewed and are low. Exacerbating diseases include diabetes. He has no history of hypothyroidism, liver disease or obesity. Factors aggravating his hyperlipidemia include thiazides. Pertinent negatives include no chest pain or shortness of breath. Current antihyperlipidemic treatment includes statins. The current treatment provides moderate improvement of lipids. Compliance problems include adherence to diet and adherence to exercise.  Risk factors for coronary artery disease include diabetes mellitus, hypertension and male sex.  Diabetes He presents for his follow-up diabetic visit. He has type 2 diabetes mellitus. No MedicAlert identification noted. The initial diagnosis of diabetes was made 2 years ago. His disease course has been fluctuating. There are no hypoglycemic associated symptoms. Pertinent negatives for hypoglycemia include no headaches or sweats. Pertinent negatives for  diabetes include no blurred vision, no chest pain and no visual change. Symptoms are improving. There are no diabetic complications. Pertinent negatives for diabetic complications include no CVA. Risk factors for coronary artery disease include dyslipidemia, hypertension, male sex and family history. Current diabetic treatment includes oral agent (monotherapy). He is compliant with treatment most of the time. His weight is stable. He is following a generally healthy diet. When asked about meal planning, he reported none. He has not had a previous visit with a dietician. He rarely participates in exercise. There is no change in his home blood glucose trend. His breakfast blood glucose is taken between 8-9 am. His breakfast blood glucose range is generally 90-110 mg/dl. His overall blood glucose range is 110-130 mg/dl. An ACE inhibitor/angiotensin II receptor blocker is being taken. He does not see a podiatrist.Eye exam is current (Sept 15, 2014).     Review of Systems  Eyes: Negative for blurred vision.  Respiratory: Negative for shortness of breath.   Cardiovascular: Negative for chest pain and palpitations.  Musculoskeletal: Negative for neck pain.  Neurological: Negative for headaches.  All other systems reviewed and are negative.       Objective:   Physical Exam  Constitutional: He is oriented to person, place, and time. He appears well-developed and well-nourished.  HENT:  Head: Normocephalic.  Right Ear: External ear normal.  Left Ear: External ear normal.  Nose: Nose normal.  Mouth/Throat: Oropharynx is clear and moist.  Eyes: EOM are normal. Pupils are equal, round, and reactive to light.  Neck: Normal range of motion. Neck supple. No thyromegaly present.  Cardiovascular: Normal rate, regular rhythm, normal heart sounds and intact distal pulses.   No murmur heard. Pulmonary/Chest: Effort normal and breath sounds normal. He has no wheezes. He has no rales.  Abdominal: Soft.  Bowel  sounds are normal.  Musculoskeletal: Normal range of motion.  Neurological: He is alert and oriented to person, place, and time.  Skin: Skin is warm and dry.  Psychiatric: He has a normal mood and affect. His behavior is normal. Judgment and thought content normal.    BP 147/85  Pulse 75  Temp(Src) 97.8 F (36.6 C) (Oral)  Ht _0  (1.854 m)  Wt 240 lb (108.863 kg)  BMI 31.67 kg/m2     Results for orders placed in visit on 08/29/13  POCT GLYCOSYLATED HEMOGLOBIN (HGB A1C)      Result Value Range   Hemoglobin A1C 6.1           Assessment & Plan:   1. Hyperlipidemia   2. Hypertension   3. Type II or unspecified type diabetes mellitus without mention of complication, not stated as uncontrolled    Orders Placed This Encounter  Procedures  . CMP14+EGFR  . NMR, lipoprofile  . POCT glycosylated hemoglobin (Hb A1C)   Meds ordered this encounter  Medications  . amLODipine (NORVASC) 10 MG tablet    Sig: Take 1 tablet (10 mg total) by mouth daily. 1/2qd    Dispense:  30 tablet    Refill:  5    Order Specific Question:  Supervising Provider    Answer:  Chipper Herb [[id number]]  . rosuvastatin (CRESTOR) 10 MG tablet    Sig: Take 1 tablet (10 mg total) by mouth daily.    Dispense:  30 tablet    Refill:  5    Order Specific Question:  Supervising Provider    Answer:  Chipper Herb [[id number]]  . hydrochlorothiazide (HYDRODIURIL) 25 MG tablet    Sig: Take 1 tablet (25 mg total) by mouth daily.    Dispense:  30 tablet    Refill:  5    Order Specific Question:  Supervising Provider    Answer:  Chipper Herb [[id number]]  . benazepril (LOTENSIN) 40 MG tablet    Sig: Take 1 tablet (40 mg total) by mouth daily. Taking 1/2qd    Dispense:  30 tablet    Refill:  5    Order Specific Question:  Supervising Provider    Answer:  Chipper Herb [[id number]]  . metFORMIN (GLUCOPHAGE) 500 MG tablet    Sig: Take 1 tablet (500 mg total) by mouth 2 (two) times daily with a meal. Taking 1/2qd     Dispense:  60 tablet    Refill:  5    Order Specific Question:  Supervising Provider    Answer:  Chipper Herb [[id number]]   Hemoccult cards given Labs pending Health maintenance reviewed Diet and exercise encouraged Continue all meds Follow up  In 3 months   [place], FNP

## 2013-08-30 LAB — CMP14+EGFR
ALBUMIN: 4.8 g/dL (ref 3.6–4.8)
ALK PHOS: 64 IU/L (ref 39–117)
ALT: 19 IU/L (ref 0–44)
AST: 17 IU/L (ref 0–40)
Albumin/Globulin Ratio: 1.9 (ref 1.1–2.5)
BUN/Creatinine Ratio: 18 (ref 10–22)
BUN: 19 mg/dL (ref 8–27)
CHLORIDE: 102 mmol/L (ref 97–108)
CO2: 24 mmol/L (ref 18–29)
Calcium: 10 mg/dL (ref 8.6–10.2)
Creatinine, Ser: 1.05 mg/dL (ref 0.76–1.27)
GFR calc non Af Amer: 74 mL/min/{1.73_m2} (ref >59)
GFR, EST AFRICAN AMERICAN: 86 mL/min/{1.73_m2} (ref >59)
Globulin, Total: 2.5 g/dL (ref 1.5–4.5)
Glucose: 120 mg/dL — ABNORMAL HIGH (ref 65–99)
POTASSIUM: 5 mmol/L (ref 3.5–5.2)
SODIUM: 142 mmol/L (ref 134–144)
Total Bilirubin: 2.1 mg/dL — ABNORMAL HIGH (ref 0.0–1.2)
Total Protein: 7.3 g/dL (ref 6.0–8.5)

## 2013-08-30 LAB — NMR, LIPOPROFILE
CHOLESTEROL: 112 mg/dL (ref <200)
HDL Cholesterol by NMR: 45 mg/dL (ref >=40)
HDL Particle Number: 32.2 umol/L (ref >=30.5)
LDL PARTICLE NUMBER: 734 nmol/L (ref <1000)
LDL SIZE: 20.1 nm — AB (ref >20.5)
LDLC SERPL CALC-MCNC: 38 mg/dL (ref <100)
LP-IR SCORE: 72 — AB (ref <=45)
Small LDL Particle Number: 461 nmol/L (ref <=527)
Triglycerides by NMR: 146 mg/dL (ref <150)

## 2013-08-31 ENCOUNTER — Other Ambulatory Visit: Payer: Self-pay | Admitting: Nurse Practitioner

## 2013-08-31 ENCOUNTER — Telehealth: Payer: Self-pay | Admitting: *Deleted

## 2013-08-31 NOTE — Telephone Encounter (Signed)
Message copied by Priscille Heidelberg on Wed Aug 31, 2013  8:02 AM ------      Message from: Chevis Pretty      Created: Wed Aug 31, 2013  7:51 AM       All labs look really good- continue current meds and recheck in 3 months ------

## 2013-08-31 NOTE — Telephone Encounter (Signed)
Aware og good lab results.

## 2013-08-31 NOTE — Telephone Encounter (Signed)
Message copied by Shelbie Ammons on Wed Aug 31, 2013  9:06 AM ------      Message from: Chevis Pretty      Created: Wed Aug 31, 2013  7:51 AM       All labs look really good- continue current meds and recheck in 3 months ------

## 2013-09-02 ENCOUNTER — Other Ambulatory Visit: Payer: Medicare HMO

## 2013-09-02 NOTE — Progress Notes (Signed)
Pt came in for labs only 

## 2013-09-03 LAB — FECAL OCCULT BLOOD, IMMUNOCHEMICAL: Fecal Occult Bld: POSITIVE — AB

## 2013-09-05 ENCOUNTER — Telehealth: Payer: Self-pay | Admitting: *Deleted

## 2013-09-05 ENCOUNTER — Other Ambulatory Visit: Payer: Self-pay | Admitting: Nurse Practitioner

## 2013-09-05 DIAGNOSIS — R195 Other fecal abnormalities: Secondary | ICD-10-CM

## 2013-09-05 DIAGNOSIS — Z1211 Encounter for screening for malignant neoplasm of colon: Secondary | ICD-10-CM

## 2013-09-05 NOTE — Telephone Encounter (Signed)
Message copied by Ilean China on Mon Sep 05, 2013 10:45 AM ------      Message from: Chipper Herb      Created: Sat Sep 03, 2013 10:29 AM       The fecal occult blood test is positive for blood.      Please have patient come back and do another FOBT plus get CBC      Patient may need a colonoscopy, he is to due one this year according to the health maintenance record ------

## 2013-09-05 NOTE — Telephone Encounter (Signed)
Patient will return in the next few days for a CBC and to pickup an FOBT. He is due for a colonoscopy with [facility]. Order placed.

## 2013-09-06 ENCOUNTER — Encounter: Payer: Self-pay | Admitting: Gastroenterology

## 2013-09-06 ENCOUNTER — Telehealth: Payer: Self-pay | Admitting: *Deleted

## 2013-09-07 ENCOUNTER — Other Ambulatory Visit: Payer: Self-pay | Admitting: Nurse Practitioner

## 2013-09-07 ENCOUNTER — Other Ambulatory Visit (INDEPENDENT_AMBULATORY_CARE_PROVIDER_SITE_OTHER): Payer: Medicare HMO

## 2013-09-07 DIAGNOSIS — R195 Other fecal abnormalities: Secondary | ICD-10-CM

## 2013-09-07 LAB — POCT CBC
Granulocyte percent: 65.9 %G (ref 37–80)
HCT, POC: 49.4 % (ref 43.5–53.7)
HEMOGLOBIN: 16 g/dL (ref 14.1–18.1)
LYMPH, POC: 3 (ref 0.6–3.4)
MCH: 27.9 pg (ref 27–31.2)
MCHC: 32.3 g/dL (ref 31.8–35.4)
MCV: 86.5 fL (ref 80–97)
MPV: 7.6 fL (ref 0–99.8)
POC Granulocyte: 7.8 — AB (ref 2–6.9)
POC LYMPH PERCENT: 25.6 %L (ref 10–50)
Platelet Count, POC: 261 10*3/uL (ref 142–424)
RBC: 5.7 M/uL (ref 4.69–6.13)
RDW, POC: 13.6 %
WBC: 11.8 10*3/uL — AB (ref 4.6–10.2)

## 2013-09-07 NOTE — Progress Notes (Signed)
Patient came in for labs only.

## 2013-09-09 ENCOUNTER — Other Ambulatory Visit: Payer: Self-pay | Admitting: Nurse Practitioner

## 2013-09-12 ENCOUNTER — Other Ambulatory Visit: Payer: Medicare HMO

## 2013-09-12 DIAGNOSIS — Z1212 Encounter for screening for malignant neoplasm of rectum: Secondary | ICD-10-CM

## 2013-09-14 ENCOUNTER — Other Ambulatory Visit: Payer: Self-pay | Admitting: Nurse Practitioner

## 2013-09-14 LAB — FECAL OCCULT BLOOD, IMMUNOCHEMICAL: Fecal Occult Bld: POSITIVE — AB

## 2013-09-15 NOTE — Telephone Encounter (Signed)
No vm. Patient has second positive stool specimen. GI referral being done.

## 2013-09-15 NOTE — Telephone Encounter (Signed)
Message copied by Shelbie Ammons on Thu Sep 15, 2013  2:54 PM ------      Message from: Chevis Pretty      Created: Wed Sep 14, 2013  4:40 PM       Second positive test already made GI referral- probably needs colonoscopy ------

## 2013-09-28 ENCOUNTER — Encounter: Payer: Self-pay | Admitting: Gastroenterology

## 2013-09-28 ENCOUNTER — Ambulatory Visit (INDEPENDENT_AMBULATORY_CARE_PROVIDER_SITE_OTHER): Payer: Medicare HMO | Admitting: Gastroenterology

## 2013-09-28 VITALS — BP 112/64 | HR 76 | Ht 73.0 in | Wt 236.0 lb

## 2013-09-28 DIAGNOSIS — Z86010 Personal history of colonic polyps: Secondary | ICD-10-CM

## 2013-09-28 DIAGNOSIS — R195 Other fecal abnormalities: Secondary | ICD-10-CM

## 2013-09-28 DIAGNOSIS — R1319 Other dysphagia: Secondary | ICD-10-CM

## 2013-09-28 MED ORDER — PEG-KCL-NACL-NASULF-NA ASC-C 100 G PO SOLR: 1.0000 | Freq: Once | ORAL | Status: DC

## 2013-09-28 NOTE — Progress Notes (Signed)
    History of Present Illness: This is a 68 year old male who was recently found to have Hemoccult positive stool. He has a history of tubulovillous adenomatous colon polyps found in August 2009 and he is overdue for surveillance colonoscopy. He occasionally notes small amounts of bright red blood when wiping after a hard bowel movement. He relates a 2 year history of intermittent solid food dysphagia and occasionally he has self-induced vomiting. Denies weight loss, abdominal pain, constipation, diarrhea, change in stool caliber, melena, nausea, vomiting, reflux symptoms, chest pain.  Review of Systems: Pertinent positive and negative review of systems were noted in the above HPI section. All other review of systems were otherwise negative.  Current Medications, Allergies, Past Medical History, Past Surgical History, Family History and Social History were reviewed in [employer] record.  Physical Exam: General: Well developed , well nourished, no acute distress Head: Normocephalic and atraumatic Eyes:  sclerae anicteric, EOMI Ears: Normal auditory acuity Mouth: No deformity or lesions Neck: Supple, no masses or thyromegaly Lungs: Clear throughout to auscultation Heart: Regular rate and rhythm; no murmurs, rubs or bruits Abdomen: Soft, non tender and non distended. No masses, hepatosplenomegaly or hernias noted. Normal Bowel sounds Rectal: Deferred to colonoscopy Musculoskeletal: Symmetrical with no gross deformities  Skin: No lesions on visible extremities Pulses:  Normal pulses noted Extremities: No clubbing, cyanosis, edema or deformities noted Neurological: Alert oriented x 4, grossly nonfocal Cervical Nodes:  No significant cervical adenopathy Inguinal Nodes: No significant inguinal adenopathy Psychological:  Alert and cooperative. Normal mood and affect  Assessment and Recommendations:  1. Hemoccult positive stool. Rule out colorectal neoplasms and other  disorders. Schedule colonoscopy. The risks, benefits, and alternatives to colonoscopy with possible biopsy and possible polypectomy were discussed with the patient and they consent to proceed.   2. Personal history of TVA. Colonoscopy as above.  3. Dysphagia. R/O esophageal stricture. Schedule EGD. The risks, benefits, and alternatives to endoscopy with possible biopsy and possible dilation were discussed with the patient and they consent to proceed.

## 2013-09-28 NOTE — Patient Instructions (Signed)
You have been scheduled for an endoscopy and colonoscopy with propofol. Please follow the written instructions given to you at your visit today. Please pick up your prep at the pharmacy within the next 1-3 days. If you use inhalers (even only as needed), please bring them with you on the day of your procedure.  Thank you for choosing me and [facility].  Pricilla Riffle. Dagoberto Ligas., MD., [facility]

## 2013-10-31 ENCOUNTER — Other Ambulatory Visit: Payer: Self-pay | Admitting: Nurse Practitioner

## 2013-11-28 ENCOUNTER — Ambulatory Visit (INDEPENDENT_AMBULATORY_CARE_PROVIDER_SITE_OTHER): Payer: Medicare HMO | Admitting: Nurse Practitioner

## 2013-11-28 ENCOUNTER — Encounter: Payer: Self-pay | Admitting: Nurse Practitioner

## 2013-11-28 VITALS — BP 140/79 | HR 68 | Temp 98.1°F | Ht 73.0 in | Wt 235.0 lb

## 2013-11-28 DIAGNOSIS — Z6831 Body mass index (BMI) 31.0-31.9, adult: Secondary | ICD-10-CM

## 2013-11-28 DIAGNOSIS — I10 Essential (primary) hypertension: Secondary | ICD-10-CM

## 2013-11-28 DIAGNOSIS — E785 Hyperlipidemia, unspecified: Secondary | ICD-10-CM

## 2013-11-28 DIAGNOSIS — E119 Type 2 diabetes mellitus without complications: Secondary | ICD-10-CM

## 2013-11-28 DIAGNOSIS — Z713 Dietary counseling and surveillance: Secondary | ICD-10-CM

## 2013-11-28 LAB — POCT GLYCOSYLATED HEMOGLOBIN (HGB A1C): Hemoglobin A1C: 5.9

## 2013-11-28 MED ORDER — GLUCOSE BLOOD VI STRP: ORAL_STRIP | Status: DC

## 2013-11-28 MED ORDER — MICROLET LANCETS MISC: Status: DC

## 2013-11-28 MED ORDER — ROSUVASTATIN CALCIUM 10 MG PO TABS: ORAL_TABLET | ORAL | Status: DC

## 2013-11-28 MED ORDER — HYDROCHLOROTHIAZIDE 25 MG PO TABS: 25.0000 mg | ORAL_TABLET | Freq: Every day | ORAL | Status: DC

## 2013-11-28 NOTE — Progress Notes (Signed)
Subjective:    Patient ID: Jorge Garcia, male    DOB: [birth year], 68 y.o.   MRN: [id number]   Patient  Here today for follow-up of chronic medical problems- no changes since last visit.  Hypertension This is a chronic problem. The current episode started more than 1 year ago. The problem has been resolved since onset. The problem is controlled. Pertinent negatives include no blurred vision, chest pain, headaches, neck pain, palpitations, peripheral edema, shortness of breath or sweats. There are no associated agents to hypertension. Risk factors for coronary artery disease include diabetes mellitus, dyslipidemia and male gender. Past treatments include ACE inhibitors, calcium channel blockers and diuretics. The current treatment provides significant improvement. Compliance problems include diet and exercise.  There is no history of CAD/MI or CVA. There is no history of sleep apnea.  Hyperlipidemia This is a recurrent problem. The current episode started more than 1 year ago. The problem is controlled. Recent lipid tests were reviewed and are low. Exacerbating diseases include diabetes. He has no history of hypothyroidism, liver disease or obesity. Factors aggravating his hyperlipidemia include thiazides. Pertinent negatives include no chest pain or shortness of breath. Current antihyperlipidemic treatment includes statins. The current treatment provides moderate improvement of lipids. Compliance problems include adherence to diet and adherence to exercise.  Risk factors for coronary artery disease include diabetes mellitus, hypertension and male sex.  Diabetes He presents for his follow-up diabetic visit. He has type 2 diabetes mellitus. No MedicAlert identification noted. The initial diagnosis of diabetes was made 2 years ago. His disease course has been fluctuating. There are no hypoglycemic associated symptoms. Pertinent negatives for hypoglycemia include no headaches or sweats. Pertinent negatives  for diabetes include no blurred vision, no chest pain and no visual change. Symptoms are improving. There are no diabetic complications. Pertinent negatives for diabetic complications include no CVA. Risk factors for coronary artery disease include dyslipidemia, hypertension, male sex and family history. Current diabetic treatment includes oral agent (monotherapy). He is compliant with treatment most of the time. His weight is stable. He is following a generally healthy diet. When asked about meal planning, he reported none. He has not had a previous visit with a dietician. He rarely participates in exercise. There is no change in his home blood glucose trend. His breakfast blood glucose is taken between 8-9 am. His breakfast blood glucose range is generally 90-110 mg/dl. His overall blood glucose range is 110-130 mg/dl. An ACE inhibitor/angiotensin II receptor blocker is being taken. He does not see a podiatrist.Eye exam is current (Sept 15, 2014).     Review of Systems  Eyes: Negative for blurred vision.  Respiratory: Negative for shortness of breath.   Cardiovascular: Negative for chest pain and palpitations.  Musculoskeletal: Negative for neck pain.  Neurological: Negative for headaches.  All other systems reviewed and are negative.      Objective:   Physical Exam  Constitutional: He is oriented to person, place, and time. He appears well-developed and well-nourished.  HENT:  Head: Normocephalic.  Right Ear: External ear normal.  Left Ear: External ear normal.  Nose: Nose normal.  Mouth/Throat: Oropharynx is clear and moist.  Eyes: EOM are normal. Pupils are equal, round, and reactive to light.  Neck: Normal range of motion. Neck supple. No thyromegaly present.  Cardiovascular: Normal rate, regular rhythm, normal heart sounds and intact distal pulses.   No murmur heard. Pulmonary/Chest: Effort normal and breath sounds normal. He has no wheezes. He has no rales.  Abdominal: Soft.  Bowel  sounds are normal.  Musculoskeletal: Normal range of motion.  Neurological: He is alert and oriented to person, place, and time.  Skin: Skin is warm and dry.  Psychiatric: He has a normal mood and affect. His behavior is normal. Judgment and thought content normal.    BP 140/79  Pulse 68  Temp(Src) 98.1 F (36.7 C) (Oral)  Ht $R'6\' 1"'qv$  (1.854 m)  Wt 235 lb (106.595 kg)  BMI 31.01 kg/m2     Results for orders placed in visit on 11/28/13  POCT GLYCOSYLATED HEMOGLOBIN (HGB A1C)      Result Value Ref Range   Hemoglobin A1C 5.9           Assessment & Plan:   1. Hyperlipidemia   2. Hypertension   3. Type II or unspecified type diabetes mellitus without mention of complication, not stated as uncontrolled   4. BMI 31.0-31.9,adult   5. Weight loss counseling, encounter for    Orders Placed This Encounter  Procedures  . CMP14+EGFR  . NMR, lipoprofile  . POCT glycosylated hemoglobin (Hb A1C)   Meds ordered this encounter  Medications  . rosuvastatin (CRESTOR) 10 MG tablet    Sig: TAKE 1 TABLET (10 MG TOTAL) BY MOUTH DAILY.    Dispense:  30 tablet    Refill:  5    Order Specific Question:  Supervising Provider    Answer:  Chipper Herb [[id number]]  . glucose blood (BAYER CONTOUR TEST) test strip    Sig: Patient test 1 x per da and prn  Dx 250.02    Dispense:  100 each    Refill:  12    Order Specific Question:  Supervising Provider    Answer:  Chipper Herb [[id number]]  . MICROLET LANCETS MISC    Sig: USE AS DIRECTED TWICE DAILY    Dispense:  100 each    Refill:  5    Use to check blood sugar twice daily. Dx code 8.00    Order Specific Question:  Supervising Provider    Answer:  Chipper Herb [[id number]]  . hydrochlorothiazide (HYDRODIURIL) 25 MG tablet    Sig: Take 1 tablet (25 mg total) by mouth daily.    Dispense:  30 tablet    Refill:  5    Order Specific Question:  Supervising Provider    Answer:  Chipper Herb [[id number]]    Labs pending Health maintenance  reviewed Diet and exercise encouraged Continue all meds Follow up  In 3 months   [place], FNP

## 2013-11-28 NOTE — Patient Instructions (Signed)

## 2013-11-29 LAB — NMR, LIPOPROFILE
CHOLESTEROL: 113 mg/dL (ref <200)
HDL Cholesterol by NMR: 36 mg/dL — ABNORMAL LOW (ref >=40)
HDL Particle Number: 29.8 umol/L — ABNORMAL LOW (ref >=30.5)
LDL PARTICLE NUMBER: 765 nmol/L (ref <1000)
LDL SIZE: 20 nm (ref >20.5)
LDLC SERPL CALC-MCNC: 51 mg/dL (ref <100)
LP-IR SCORE: 56 — AB (ref <=45)
Small LDL Particle Number: 488 nmol/L (ref <=527)
Triglycerides by NMR: 128 mg/dL (ref <150)

## 2013-11-29 LAB — CMP14+EGFR
A/G RATIO: 2.3 (ref 1.1–2.5)
ALBUMIN: 4.5 g/dL (ref 3.6–4.8)
ALT: 21 IU/L (ref 0–44)
AST: 22 IU/L (ref 0–40)
Alkaline Phosphatase: 59 IU/L (ref 39–117)
BILIRUBIN TOTAL: 2.6 mg/dL — AB (ref 0.0–1.2)
BUN/Creatinine Ratio: 14 (ref 10–22)
BUN: 14 mg/dL (ref 8–27)
CO2: 22 mmol/L (ref 18–29)
Calcium: 9.3 mg/dL (ref 8.6–10.2)
Chloride: 102 mmol/L (ref 97–108)
Creatinine, Ser: 1.02 mg/dL (ref 0.76–1.27)
GFR, EST AFRICAN AMERICAN: 89 mL/min/{1.73_m2} (ref >59)
GFR, EST NON AFRICAN AMERICAN: 77 mL/min/{1.73_m2} (ref >59)
GLUCOSE: 108 mg/dL — AB (ref 65–99)
Globulin, Total: 2 g/dL (ref 1.5–4.5)
Potassium: 4.6 mmol/L (ref 3.5–5.2)
Sodium: 141 mmol/L (ref 134–144)
TOTAL PROTEIN: 6.5 g/dL (ref 6.0–8.5)

## 2013-12-01 ENCOUNTER — Encounter: Payer: Self-pay | Admitting: Gastroenterology

## 2013-12-01 ENCOUNTER — Ambulatory Visit (AMBULATORY_SURGERY_CENTER): Payer: Medicare HMO | Admitting: Gastroenterology

## 2013-12-01 VITALS — BP 135/79 | HR 48 | Temp 97.6°F | Resp 17 | Ht 73.0 in | Wt 236.0 lb

## 2013-12-01 DIAGNOSIS — K209 Esophagitis, unspecified without bleeding: Secondary | ICD-10-CM

## 2013-12-01 DIAGNOSIS — R1319 Other dysphagia: Secondary | ICD-10-CM

## 2013-12-01 DIAGNOSIS — K222 Esophageal obstruction: Secondary | ICD-10-CM

## 2013-12-01 DIAGNOSIS — D126 Benign neoplasm of colon, unspecified: Secondary | ICD-10-CM

## 2013-12-01 DIAGNOSIS — K208 Other esophagitis without bleeding: Secondary | ICD-10-CM

## 2013-12-01 DIAGNOSIS — Z86010 Personal history of colonic polyps: Secondary | ICD-10-CM

## 2013-12-01 DIAGNOSIS — R195 Other fecal abnormalities: Secondary | ICD-10-CM

## 2013-12-01 HISTORY — PX: COLONOSCOPY: SHX174

## 2013-12-01 MED ORDER — OMEPRAZOLE 40 MG PO CPDR: DELAYED_RELEASE_CAPSULE | ORAL | Status: DC

## 2013-12-01 MED ORDER — SODIUM CHLORIDE 0.9 % IV SOLN: 500.0000 mL | INTRAVENOUS | Status: DC

## 2013-12-01 NOTE — Progress Notes (Signed)
Report to pacu rn, vss, bbs=clear 

## 2013-12-01 NOTE — Op Note (Addendum)
[place]  [address] [place], [address]   ENDOSCOPY PROCEDURE REPORT  PATIENT: [name], Jorge Garcia  MR#: [id number] BIRTHDATE: 1945/11/06 , 12  yrs. old GENDER: Male ENDOSCOPIST: Ladene Artist, MD, [facility] PROCEDURE DATE:  12/01/2013 PROCEDURE:   EGD with dilatation over guidewire and EGD with biopsy  ASA CLASS:   Class II INDICATIONS:dysphagia. MEDICATIONS: There was residual sedation effect present from prior procedure, MAC sedation, administered by CRNA, and propofol (Diprivan) 150mg  IV TOPICAL ANESTHETIC:   none DESCRIPTION OF PROCEDURE:   After the risks benefits and alternatives of the procedure were thoroughly explained, informed consent was obtained.  The     endoscope was introduced through the mouth  and advanced to the descending duodenum ,      The instrument was slowly withdrawn as the mucosa was carefully examined.  ESOPHAGUS: A single superficial ulcer measuring 10 x 12 mm in size was found in the lower third of the esophagus. It was covered with exudate. Biopsies were taken at edge of the ulcer and at the center of the ulcer.   A stricture was found at the gastroesophageal junction.  The stenosis was traversable with the endoscope.   The esophagus was otherwise normal. STOMACH: The mucosa and folds of the stomach appeared normal. DUODENUM: The duodenal mucosa showed no abnormalities in the bulb and second portion of the duodenum.     Dilation was then performed at the gastroesphageal junction Dilator:Savary over guidewire Size:12, 13, 14 mm  Reststance:minimal Heme:none  COMPLICATIONS: There were no complications.  ENDOSCOPIC IMPRESSION: 1.   Ulcer in the lower third of the esophagus 2.   Stricture at the gastroesophageal junction  RECOMMENDATIONS: 1.  anti-reflux regimen 2.  await pathology results 3.  PPI qam: omeprazole 40 mg po qam, 1 year of refills 4.  post dilation instructions 5.  Call office next 2-3 days to schedule an office  appointment for 4-6 weeks  eSigned:  Ladene Artist, MD, [facility] 12/01/2013 11:25 AM

## 2013-12-01 NOTE — Op Note (Signed)
[place]  [address]. [place], [address]   COLONOSCOPY PROCEDURE REPORT  PATIENT: Jorge Garcia, Jorge Garcia  MR#: [id number] BIRTHDATE: February 26, 1946 , 32  yrs. old GENDER: Male ENDOSCOPIST: Ladene Artist, MD, [facility] PROCEDURE DATE:  12/01/2013 PROCEDURE:   Colonoscopy with biopsy and snare polypectomy First Screening Colonoscopy - Avg.  risk and is 50 yrs.  old or older - No.  Prior Negative Screening - Now for repeat screening. N/A  History of Adenoma - Now for follow-up colonoscopy & has been > or = to 3 yrs.  Yes hx of adenoma.  Has been 3 or more years since last colonoscopy.  Polyps Removed Today? Yes. ASA CLASS:   Class II INDICATIONS:Patient's personal history of adenomatous colon polyps and heme-positive stool. MEDICATIONS: MAC sedation, administered by CRNA and propofol (Diprivan) 230mg  IV DESCRIPTION OF PROCEDURE:   After the risks benefits and alternatives of the procedure were thoroughly explained, informed consent was obtained.  A digital rectal exam revealed no abnormalities of the rectum.   The [id number]  endoscope was introduced through the anus and advanced to the cecum, which was identified by both the appendix and ileocecal valve. No adverse events experienced.   The quality of the prep was good, using MoviPrep  The instrument was then slowly withdrawn as the colon was fully examined.  COLON FINDINGS: A sessile polyp measuring 6 mm in size was found at the cecum.  A polypectomy was performed with a cold snare.  The resection was complete and the polyp tissue was completely retrieved.   A sessile polyp measuring 4 mm in size was found in the rectum.  A polypectomy was performed with cold forceps.  The resection was complete and the polyp tissue was completely retrieved.   Mild diverticulosis was noted in the sigmoid colon. The colon was otherwise normal.  There was no diverticulosis, inflammation, polyps or cancers unless previously  stated. Retroflexed views revealed small internal hemorrhoids. The time to cecum=2 minutes 02 seconds.  Withdrawal time=15 minutes 32 seconds. The scope was withdrawn and the procedure completed. COMPLICATIONS: There were no complications. ENDOSCOPIC IMPRESSION: 1.   Sessile polyp measuring 6 mm at the cecum; polypectomy performed with a cold snare 2.   Sessile polyp measuring 4 mm in the rectum; polypectomy performed with cold forceps 3.   Mild diverticulosis in the sigmoid colon 4.   Small internal hemorrhoids  RECOMMENDATIONS: 1.  Await pathology results 2.  High fiber diet with liberal fluid intake. 3.  Repeat Colonoscopy in 5 years.  eSigned:  Ladene Artist, MD, [facility] 12/01/2013 11:19 AM

## 2013-12-01 NOTE — Patient Instructions (Addendum)
YOU HAD AN ENDOSCOPIC PROCEDURE TODAY AT [facility]: Refer to the procedure report that was given to you for any specific questions about what was found during the examination.  If the procedure report does not answer your questions, please call your gastroenterologist to clarify.  If you requested that your care partner not be given the details of your procedure findings, then the procedure report has been included in a sealed envelope for you to review at your convenience later.  YOU SHOULD EXPECT: Some feelings of bloating in the abdomen. Passage of more gas than usual.  Walking can help get rid of the air that was put into your GI tract during the procedure and reduce the bloating. If you had a lower endoscopy (such as a colonoscopy or flexible sigmoidoscopy) you may notice spotting of blood in your stool or on the toilet paper. If you underwent a bowel prep for your procedure, then you may not have a normal bowel movement for a few days.  DIET: FOLLOW DILATION DIET!- SEE HANDOUT  Drink plenty of fluids but you should avoid alcoholic beverages for 24 hours.  ACTIVITY: Your care partner should take you home directly after the procedure.  You should plan to take it easy, moving slowly for the rest of the day.  You can resume normal activity the day after the procedure however you should NOT DRIVE or use heavy machinery for 24 hours (because of the sedation medicines used during the test).    SYMPTOMS TO REPORT IMMEDIATELY: A gastroenterologist can be reached at any hour.  During normal business hours, 8:30 AM to 5:00 PM Monday through Friday, call [phone].  After hours and on weekends, please call the GI answering service at [phone] who will take a message and have the physician on call contact you.   Following lower endoscopy (colonoscopy or flexible sigmoidoscopy):  Excessive amounts of blood in the stool  Significant tenderness or worsening of abdominal  pains  Swelling of the abdomen that is new, acute  Fever of 100F or higher  Following upper endoscopy (EGD)  Vomiting of blood or coffee ground material  New chest pain or pain under the shoulder blades  Painful or persistently difficult swallowing  New shortness of breath  Fever of 100F or higher  Black, tarry-looking stools  FOLLOW UP: If any biopsies were taken you will be contacted by phone or by letter within the next 1-3 weeks.  Call your gastroenterologist if you have not heard about the biopsies in 3 weeks.  Our staff will call the home number listed on your records the next business day following your procedure to check on you and address any questions or concerns that you may have at that time regarding the information given to you following your procedure. This is a courtesy call and so if there is no answer at the home number and we have not heard from you through the emergency physician on call, we will assume that you have returned to your regular daily activities without incident.  SIGNATURES/CONFIDENTIALITY: You and/or your care partner have signed paperwork which will be entered into your electronic medical record.  These signatures attest to the fact that that the information above on your After Visit Summary has been reviewed and is understood.  Full responsibility of the confidentiality of this discharge information lies with you and/or your care-partner.  Your prescription was sent to your [place]  Await pathology  Please follow dilation diet today  Please read handouts about polyps, diverticulosis, hemorrhoids, high fiber diets, esophagitis and stricture  Please call office in the next 2-3 days to set up an office appointment for 4-6 weeks

## 2013-12-02 ENCOUNTER — Telehealth: Payer: Self-pay | Admitting: *Deleted

## 2013-12-02 NOTE — Telephone Encounter (Signed)
No answer, follow-up call, does not want message left

## 2013-12-08 ENCOUNTER — Encounter: Payer: Self-pay | Admitting: Gastroenterology

## 2014-03-01 ENCOUNTER — Ambulatory Visit (INDEPENDENT_AMBULATORY_CARE_PROVIDER_SITE_OTHER): Payer: Medicare HMO | Admitting: Nurse Practitioner

## 2014-03-01 ENCOUNTER — Telehealth: Payer: Self-pay | Admitting: Nurse Practitioner

## 2014-03-01 ENCOUNTER — Encounter: Payer: Self-pay | Admitting: Nurse Practitioner

## 2014-03-01 VITALS — BP 148/78 | HR 65 | Temp 97.8°F | Ht 73.0 in | Wt 238.0 lb

## 2014-03-01 DIAGNOSIS — K209 Esophagitis, unspecified without bleeding: Secondary | ICD-10-CM

## 2014-03-01 DIAGNOSIS — I10 Essential (primary) hypertension: Secondary | ICD-10-CM

## 2014-03-01 DIAGNOSIS — E119 Type 2 diabetes mellitus without complications: Secondary | ICD-10-CM

## 2014-03-01 DIAGNOSIS — E785 Hyperlipidemia, unspecified: Secondary | ICD-10-CM

## 2014-03-01 LAB — POCT GLYCOSYLATED HEMOGLOBIN (HGB A1C): Hemoglobin A1C: 6.2

## 2014-03-01 MED ORDER — AMLODIPINE BESYLATE 10 MG PO TABS: 10.0000 mg | ORAL_TABLET | Freq: Every day | ORAL | Status: DC

## 2014-03-01 MED ORDER — BENAZEPRIL HCL 40 MG PO TABS: 40.0000 mg | ORAL_TABLET | Freq: Every day | ORAL | Status: DC

## 2014-03-01 MED ORDER — ROSUVASTATIN CALCIUM 10 MG PO TABS: ORAL_TABLET | ORAL | Status: DC

## 2014-03-01 MED ORDER — OMEPRAZOLE 40 MG PO CPDR: DELAYED_RELEASE_CAPSULE | ORAL | Status: DC

## 2014-03-01 MED ORDER — METFORMIN HCL 500 MG PO TABS: 500.0000 mg | ORAL_TABLET | Freq: Two times a day (BID) | ORAL | Status: DC

## 2014-03-01 MED ORDER — VARDENAFIL HCL 20 MG PO TABS: 20.0000 mg | ORAL_TABLET | Freq: Every day | ORAL | Status: DC | PRN

## 2014-03-01 MED ORDER — HYDROCHLOROTHIAZIDE 25 MG PO TABS: 25.0000 mg | ORAL_TABLET | Freq: Every day | ORAL | Status: DC

## 2014-03-01 NOTE — Telephone Encounter (Signed)
rx fixed  

## 2014-03-01 NOTE — Progress Notes (Signed)
Subjective:    Patient ID: Jorge Garcia, male    DOB: [birth year], 68 y.o.   MRN: [id number]   Patient  Here today for follow-up of chronic medical problems- no changes since last visit.  Hypertension This is a chronic problem. The current episode started more than 1 year ago. The problem has been resolved since onset. The problem is controlled. Pertinent negatives include no blurred vision, chest pain, headaches, neck pain, palpitations, peripheral edema, shortness of breath or sweats. There are no associated agents to hypertension. Risk factors for coronary artery disease include diabetes mellitus, dyslipidemia and male gender. Past treatments include ACE inhibitors, calcium channel blockers and diuretics. The current treatment provides significant improvement. Compliance problems include diet and exercise.  There is no history of CAD/MI or CVA. There is no history of sleep apnea.  Hyperlipidemia This is a recurrent problem. The current episode started more than 1 year ago. The problem is controlled. Recent lipid tests were reviewed and are low. Exacerbating diseases include diabetes. He has no history of hypothyroidism, liver disease or obesity. Factors aggravating his hyperlipidemia include thiazides. Pertinent negatives include no chest pain or shortness of breath. Current antihyperlipidemic treatment includes statins. The current treatment provides moderate improvement of lipids. Compliance problems include adherence to diet and adherence to exercise.  Risk factors for coronary artery disease include diabetes mellitus, hypertension and male sex.  Diabetes He presents for his follow-up diabetic visit. He has type 2 diabetes mellitus. No MedicAlert identification noted. The initial diagnosis of diabetes was made 2 years ago. His disease course has been fluctuating. There are no hypoglycemic associated symptoms. Pertinent negatives for hypoglycemia include no headaches or sweats. Pertinent negatives  for diabetes include no blurred vision, no chest pain and no visual change. Symptoms are improving. There are no diabetic complications. Pertinent negatives for diabetic complications include no CVA. Risk factors for coronary artery disease include dyslipidemia, hypertension, male sex and family history. Current diabetic treatment includes oral agent (monotherapy). He is compliant with treatment most of the time. His weight is stable. He is following a generally healthy diet. When asked about meal planning, he reported none. He has not had a previous visit with a dietician. He rarely participates in exercise. There is no change in his home blood glucose trend. His breakfast blood glucose is taken between 8-9 am. His breakfast blood glucose range is generally 90-110 mg/dl. His overall blood glucose range is 110-130 mg/dl. An ACE inhibitor/angiotensin II receptor blocker is being taken. He does not see a podiatrist.Eye exam is current (Sept 15, 2014).     Review of Systems  Eyes: Negative for blurred vision.  Respiratory: Negative for shortness of breath.   Cardiovascular: Negative for chest pain and palpitations.  Musculoskeletal: Negative for neck pain.  Neurological: Negative for headaches.  All other systems reviewed and are negative.      Objective:   Physical Exam  Constitutional: He is oriented to person, place, and time. He appears well-developed and well-nourished.  HENT:  Head: Normocephalic.  Right Ear: External ear normal.  Left Ear: External ear normal.  Nose: Nose normal.  Mouth/Throat: Oropharynx is clear and moist.  Eyes: EOM are normal. Pupils are equal, round, and reactive to light.  Neck: Normal range of motion. Neck supple. No thyromegaly present.  Cardiovascular: Normal rate, regular rhythm, normal heart sounds and intact distal pulses.   No murmur heard. Pulmonary/Chest: Effort normal and breath sounds normal. He has no wheezes. He has no rales.  Abdominal: Soft.  Bowel  sounds are normal.  Musculoskeletal: Normal range of motion.  Neurological: He is alert and oriented to person, place, and time.  Skin: Skin is warm and dry.  Psychiatric: He has a normal mood and affect. His behavior is normal. Judgment and thought content normal.    BP 148/78  Pulse 65  Temp(Src) 97.8 F (36.6 C) (Oral)  Ht _0  (1.854 m)  Wt 238 lb (107.956 kg)  BMI 31.41 kg/m2     Results for orders placed in visit on 03/01/14  POCT GLYCOSYLATED HEMOGLOBIN (HGB A1C)      Result Value Ref Range   Hemoglobin A1C 6.2%           Assessment & Plan:    1. Hyperlipidemia   2. Essential hypertension   3. Type II or unspecified type diabetes mellitus without mention of complication, not stated as uncontrolled   4. Acute esophagitis    Orders Placed This Encounter  Procedures  . CMP14+EGFR  . NMR, lipoprofile  . POCT glycosylated hemoglobin (Hb A1C)   Meds ordered this encounter  Medications  . rosuvastatin (CRESTOR) 10 MG tablet    Sig: TAKE 1 TABLET (10 MG TOTAL) BY MOUTH DAILY.    Dispense:  30 tablet    Refill:  5    Order Specific Question:  Supervising Provider    Answer:  Chipper Herb [[id number]]  . omeprazole (PRILOSEC) 40 MG capsule    Sig: TAKE 1 TABLET 30 MINUTES BEFORE BREAKFAST    Dispense:  30 capsule    Refill:  11    Order Specific Question:  Supervising Provider    Answer:  Chipper Herb [[id number]]  . metFORMIN (GLUCOPHAGE) 500 MG tablet    Sig: Take 1 tablet (500 mg total) by mouth 2 (two) times daily with a meal. Taking 1/2qd    Dispense:  60 tablet    Refill:  5    Order Specific Question:  Supervising Provider    Answer:  Chipper Herb [[id number]]  . hydrochlorothiazide (HYDRODIURIL) 25 MG tablet    Sig: Take 1 tablet (25 mg total) by mouth daily.    Dispense:  30 tablet    Refill:  5    Order Specific Question:  Supervising Provider    Answer:  Chipper Herb [[id number]]  . amLODipine (NORVASC) 10 MG tablet    Sig: Take 1 tablet (10 mg total) by  mouth daily. 1/2qd    Dispense:  30 tablet    Refill:  5    Order Specific Question:  Supervising Provider    Answer:  Chipper Herb [[id number]]  . benazepril (LOTENSIN) 40 MG tablet    Sig: Take 1 tablet (40 mg total) by mouth daily. Taking 1/2qd    Dispense:  30 tablet    Refill:  5    Order Specific Question:  Supervising Provider    Answer:  Chipper Herb Sarg[place] pending Health maintenance reviewed Diet and exercise encouraged Continue all meds Follow up  In 3 months    [place], FNP

## 2014-03-01 NOTE — Addendum Note (Signed)
Addended by: Chevis Pretty on: 03/01/2014 12:54 PM   Modules accepted: Orders

## 2014-03-01 NOTE — Patient Instructions (Signed)

## 2014-03-02 ENCOUNTER — Telehealth: Payer: Self-pay | Admitting: Family Medicine

## 2014-03-02 ENCOUNTER — Encounter: Payer: Self-pay | Admitting: Nurse Practitioner

## 2014-03-02 LAB — CMP14+EGFR
A/G RATIO: 2 (ref 1.1–2.5)
ALK PHOS: 66 IU/L (ref 39–117)
ALT: 27 IU/L (ref 0–44)
AST: 23 IU/L (ref 0–40)
Albumin: 4.7 g/dL (ref 3.6–4.8)
BUN/Creatinine Ratio: 12 (ref 10–22)
BUN: 13 mg/dL (ref 8–27)
CO2: 26 mmol/L (ref 18–29)
CREATININE: 1.06 mg/dL (ref 0.76–1.27)
Calcium: 10 mg/dL (ref 8.6–10.2)
Chloride: 99 mmol/L (ref 97–108)
GFR calc Af Amer: 85 mL/min/{1.73_m2} (ref >59)
GFR, EST NON AFRICAN AMERICAN: 73 mL/min/{1.73_m2} (ref >59)
GLOBULIN, TOTAL: 2.4 g/dL (ref 1.5–4.5)
Glucose: 122 mg/dL — ABNORMAL HIGH (ref 65–99)
Potassium: 4.7 mmol/L (ref 3.5–5.2)
SODIUM: 141 mmol/L (ref 134–144)
Total Bilirubin: 2.6 mg/dL — ABNORMAL HIGH (ref 0.0–1.2)
Total Protein: 7.1 g/dL (ref 6.0–8.5)

## 2014-03-02 LAB — NMR, LIPOPROFILE
Cholesterol: 115 mg/dL (ref 100–199)
HDL Cholesterol by NMR: 37 mg/dL — ABNORMAL LOW (ref >39)
HDL PARTICLE NUMBER: 29.5 umol/L — AB (ref >=30.5)
LDL Particle Number: 702 nmol/L (ref <1000)
LDL Size: 20.1 nm (ref >20.5)
LDLC SERPL CALC-MCNC: 43 mg/dL (ref 0–99)
LP-IR SCORE: 76 — AB (ref <=45)
SMALL LDL PARTICLE NUMBER: 407 nmol/L (ref <=527)
Triglycerides by NMR: 174 mg/dL — ABNORMAL HIGH (ref 0–149)

## 2014-03-02 NOTE — Telephone Encounter (Signed)
Pt aware of lab results 

## 2014-03-02 NOTE — Telephone Encounter (Signed)
Message copied by Waverly Ferrari on Thu Mar 02, 2014  2:15 PM ------      Message from: Chevis Pretty      Created: Thu Mar 02, 2014 12:59 PM       Hgba1c discussed at appointment      Kidney and liver function stable      Cholesterol looks great      Continue current meds- low fat diet and exercise and recheck in 3 months       ------

## 2014-03-17 ENCOUNTER — Other Ambulatory Visit: Payer: Self-pay | Admitting: Nurse Practitioner

## 2014-03-17 DIAGNOSIS — N521 Erectile dysfunction due to diseases classified elsewhere: Principal | ICD-10-CM

## 2014-03-17 DIAGNOSIS — E1169 Type 2 diabetes mellitus with other specified complication: Secondary | ICD-10-CM | POA: Insufficient documentation

## 2014-03-17 MED ORDER — SILDENAFIL CITRATE 100 MG PO TABS: ORAL_TABLET | ORAL | Status: DC

## 2014-05-10 ENCOUNTER — Encounter: Payer: Self-pay | Admitting: Gastroenterology

## 2014-06-06 ENCOUNTER — Other Ambulatory Visit: Payer: Self-pay | Admitting: Nurse Practitioner

## 2014-06-12 ENCOUNTER — Encounter: Payer: Self-pay | Admitting: Nurse Practitioner

## 2014-06-12 ENCOUNTER — Ambulatory Visit (INDEPENDENT_AMBULATORY_CARE_PROVIDER_SITE_OTHER): Payer: Medicare HMO | Admitting: Nurse Practitioner

## 2014-06-12 VITALS — BP 136/83 | HR 73 | Temp 98.4°F | Ht 73.0 in | Wt 234.6 lb

## 2014-06-12 DIAGNOSIS — I10 Essential (primary) hypertension: Secondary | ICD-10-CM

## 2014-06-12 DIAGNOSIS — E785 Hyperlipidemia, unspecified: Secondary | ICD-10-CM

## 2014-06-12 DIAGNOSIS — E119 Type 2 diabetes mellitus without complications: Secondary | ICD-10-CM | POA: Insufficient documentation

## 2014-06-12 LAB — POCT GLYCOSYLATED HEMOGLOBIN (HGB A1C): Hemoglobin A1C: 6.5

## 2014-06-12 NOTE — Progress Notes (Signed)
  Subjective:    Patient ID: Jorge Garcia, male    DOB: 11/15/1945, 68 y.o.   MRN: [id number]   Patient  Here today for follow-up of chronic medical problems- no acute complaint.  Hypertension This is a chronic problem. The current episode started more than 1 year ago. Pertinent negatives include no chest pain, headaches, neck pain, palpitations or shortness of breath. Risk factors for coronary artery disease include diabetes mellitus, dyslipidemia and male gender. Past treatments include calcium channel blockers. The current treatment provides moderate improvement.  Hyperlipidemia This is a chronic problem. The problem is controlled. Exacerbating diseases include diabetes. There are no known factors aggravating his hyperlipidemia. Pertinent negatives include no chest pain or shortness of breath. The current treatment provides moderate improvement of lipids. Risk factors for coronary artery disease include dyslipidemia, diabetes mellitus, male sex and hypertension.  Diabetes He presents for his initial diabetic visit. He has type 2 diabetes mellitus. Pertinent negatives for hypoglycemia include no headaches. Pertinent negatives for diabetes include no chest pain and no visual change. Symptoms are stable. Risk factors for coronary artery disease include male sex, dyslipidemia and diabetes mellitus. Current diabetic treatment includes oral agent (monotherapy). He is compliant with treatment all of the time. His weight is stable. He is following a diabetic diet. There is no change in his home blood glucose trend. His breakfast blood glucose is taken between 5-6 am. His breakfast blood glucose range is generally 90-110 mg/dl. He does not see a podiatrist.Eye exam is current.     Review of Systems  Respiratory: Negative for shortness of breath.   Cardiovascular: Negative for chest pain and palpitations.  Musculoskeletal: Negative for neck pain.  Neurological: Negative for headaches.  All other systems  reviewed and are negative.      Objective:   Physical Exam  Constitutional: He is oriented to person, place, and time. He appears well-developed and well-nourished.  HENT:  Head: Normocephalic.  Eyes: Conjunctivae are normal. Pupils are equal, round, and reactive to light.  Neck: Normal range of motion.  Cardiovascular: Normal rate and regular rhythm.   Pulmonary/Chest: Effort normal and breath sounds normal.  Abdominal: Soft.  Musculoskeletal: Normal range of motion.  Neurological: He is alert and oriented to person, place, and time. He has normal reflexes.  Skin: Skin is warm.  Psychiatric: He has a normal mood and affect. His behavior is normal. Thought content normal.     BP 136/83 mmHg  Pulse 73  Temp(Src) 98.4 F (36.9 C) (Oral)  Ht _0  (1.854 m)  Wt 234 lb 9.6 oz (106.414 kg)  BMI 30.96 kg/m2    Results for orders placed or performed in visit on 06/12/14  POCT glycosylated hemoglobin (Hb A1C)  Result Value Ref Range   Hemoglobin A1C 6.5%          Assessment & Plan:   1. Hyperlipidemia Low fat diet - NMR, lipoprofile  2. Essential hypertension Low salt diet - CMP14+EGFR  3. Type 2 diabetes mellitus without complication Low carb diet  - POCT glycosylated hemoglobin (Hb A1C)  Health maintenance reviewed Lab pending Diet and exercise encouraged Follow up in 3 months    [place], FNP

## 2014-06-12 NOTE — Patient Instructions (Signed)

## 2014-06-13 LAB — CMP14+EGFR
A/G RATIO: 2.2 (ref 1.1–2.5)
ALT: 24 IU/L (ref 0–44)
AST: 19 IU/L (ref 0–40)
Albumin: 4.6 g/dL (ref 3.6–4.8)
Alkaline Phosphatase: 64 IU/L (ref 39–117)
BILIRUBIN TOTAL: 2 mg/dL — AB (ref 0.0–1.2)
BUN/Creatinine Ratio: 9 — ABNORMAL LOW (ref 10–22)
BUN: 10 mg/dL (ref 8–27)
CO2: 26 mmol/L (ref 18–29)
Calcium: 9.5 mg/dL (ref 8.6–10.2)
Chloride: 103 mmol/L (ref 97–108)
Creatinine, Ser: 1.07 mg/dL (ref 0.76–1.27)
GFR calc non Af Amer: 72 mL/min/{1.73_m2} (ref >59)
GFR, EST AFRICAN AMERICAN: 83 mL/min/{1.73_m2} (ref >59)
Globulin, Total: 2.1 g/dL (ref 1.5–4.5)
Glucose: 117 mg/dL — ABNORMAL HIGH (ref 65–99)
POTASSIUM: 4.6 mmol/L (ref 3.5–5.2)
Sodium: 143 mmol/L (ref 134–144)
Total Protein: 6.7 g/dL (ref 6.0–8.5)

## 2014-06-13 LAB — NMR, LIPOPROFILE
Cholesterol: 111 mg/dL (ref 100–199)
HDL Cholesterol by NMR: 39 mg/dL — ABNORMAL LOW (ref >39)
HDL Particle Number: 32.2 umol/L (ref >=30.5)
LDL Particle Number: 694 nmol/L (ref <1000)
LDL SIZE: 20.2 nm (ref >20.5)
LDL-C: 45 mg/dL (ref 0–99)
LP-IR Score: 64 — ABNORMAL HIGH (ref <=45)
SMALL LDL PARTICLE NUMBER: 380 nmol/L (ref <=527)
Triglycerides by NMR: 133 mg/dL (ref 0–149)

## 2014-06-14 ENCOUNTER — Telehealth: Payer: Self-pay | Admitting: *Deleted

## 2014-06-14 NOTE — Telephone Encounter (Signed)
-----   Message from [facility], [place] sent at 06/13/2014 11:26 AM EST ----- Hgba1c discussed at appointment Kidney and liver function stable Cholesterol looks great Continue current meds- low fat diet and exercise and recheck in 3 months

## 2014-07-03 ENCOUNTER — Other Ambulatory Visit: Payer: Self-pay | Admitting: Nurse Practitioner

## 2014-09-09 ENCOUNTER — Other Ambulatory Visit: Payer: Self-pay | Admitting: Nurse Practitioner

## 2014-09-18 ENCOUNTER — Ambulatory Visit (INDEPENDENT_AMBULATORY_CARE_PROVIDER_SITE_OTHER): Payer: Medicare HMO | Admitting: Nurse Practitioner

## 2014-09-18 ENCOUNTER — Encounter: Payer: Self-pay | Admitting: Nurse Practitioner

## 2014-09-18 VITALS — BP 135/77 | HR 69 | Temp 97.0°F | Ht 73.0 in | Wt 236.0 lb

## 2014-09-18 DIAGNOSIS — I10 Essential (primary) hypertension: Secondary | ICD-10-CM | POA: Diagnosis not present

## 2014-09-18 DIAGNOSIS — E785 Hyperlipidemia, unspecified: Secondary | ICD-10-CM | POA: Diagnosis not present

## 2014-09-18 DIAGNOSIS — E119 Type 2 diabetes mellitus without complications: Secondary | ICD-10-CM

## 2014-09-18 LAB — POCT GLYCOSYLATED HEMOGLOBIN (HGB A1C): HEMOGLOBIN A1C: 6.4

## 2014-09-18 MED ORDER — AMLODIPINE BESYLATE 10 MG PO TABS: 10.0000 mg | ORAL_TABLET | Freq: Every day | ORAL | Status: DC

## 2014-09-18 MED ORDER — METFORMIN HCL 500 MG PO TABS: 500.0000 mg | ORAL_TABLET | Freq: Two times a day (BID) | ORAL | Status: DC

## 2014-09-18 MED ORDER — BENAZEPRIL HCL 40 MG PO TABS: 40.0000 mg | ORAL_TABLET | Freq: Every day | ORAL | Status: DC

## 2014-09-18 MED ORDER — ROSUVASTATIN CALCIUM 10 MG PO TABS: ORAL_TABLET | ORAL | Status: DC

## 2014-09-18 NOTE — Patient Instructions (Signed)
Diabetes and Exercise Exercising regularly is important. It is not just about losing weight. It has many health benefits, such as:  Improving your overall fitness, flexibility, and endurance.  Increasing your bone density.  Helping with weight control.  Decreasing your body fat.  Increasing your muscle strength.  Reducing stress and tension.  Improving your overall health. People with diabetes who exercise gain additional benefits because exercise:  Reduces appetite.  Improves the body's use of blood sugar (glucose).  Helps lower or control blood glucose.  Decreases blood pressure.  Helps control blood lipids (such as cholesterol and triglycerides).  Improves the body's use of the hormone insulin by:  Increasing the body's insulin sensitivity.  Reducing the body's insulin needs.  Decreases the risk for heart disease because exercising:  Lowers cholesterol and triglycerides levels.  Increases the levels of good cholesterol (such as high-density lipoproteins [HDL]) in the body.  Lowers blood glucose levels. YOUR ACTIVITY PLAN  Choose an activity that you enjoy and set realistic goals. Your health care provider or diabetes educator can help you make an activity plan that works for you. Exercise regularly as directed by your health care provider. This includes:  Performing resistance training twice a week such as push-ups, sit-ups, lifting weights, or using resistance bands.  Performing 150 minutes of cardio exercises each week such as walking, running, or playing sports.  Staying active and spending no more than 90 minutes at one time being inactive. Even short bursts of exercise are good for you. Three 10-minute sessions spread throughout the day are just as beneficial as a single 30-minute session. Some exercise ideas include:  Taking the dog for a walk.  Taking the stairs instead of the elevator.  Dancing to your favorite song.  Doing an exercise  video.  Doing your favorite exercise with a friend. RECOMMENDATIONS FOR EXERCISING WITH TYPE 1 OR TYPE 2 DIABETES   Check your blood glucose before exercising. If blood glucose levels are greater than 240 mg/dL, check for urine ketones. Do not exercise if ketones are present.  Avoid injecting insulin into areas of the body that are going to be exercised. For example, avoid injecting insulin into:  The arms when playing tennis.  The legs when jogging.  Keep a record of:  Food intake before and after you exercise.  Expected peak times of insulin action.  Blood glucose levels before and after you exercise.  The type and amount of exercise you have done.  Review your records with your health care provider. Your health care provider will help you to develop guidelines for adjusting food intake and insulin amounts before and after exercising.  If you take insulin or oral hypoglycemic agents, watch for signs and symptoms of hypoglycemia. They include:  Dizziness.  Shaking.  Sweating.  Chills.  Confusion.  Drink plenty of water while you exercise to prevent dehydration or heat stroke. Body water is lost during exercise and must be replaced.  Talk to your health care provider before starting an exercise program to make sure it is safe for you. Remember, almost any type of activity is better than none. Document Released: 10/18/2003 Document Revised: 12/12/2013 Document Reviewed: 01/04/2013 ExitCare Patient Information 2015 [employer]. This information is not intended to replace advice given to you by your health care provider. Make sure you discuss any questions you have with your health care provider.  

## 2014-09-18 NOTE — Progress Notes (Signed)
Subjective:    Patient ID: Jorge Garcia, male    DOB: 10-Mar-1946, 69 y.o.   MRN: [id number]   Patient  Here today for follow-up of chronic medical problems- no acute complaint.  Hypertension This is a chronic problem. The current episode started more than 1 year ago. Pertinent negatives include no chest pain, headaches, neck pain, palpitations or shortness of breath. Risk factors for coronary artery disease include diabetes mellitus, dyslipidemia and male gender. Past treatments include calcium channel blockers. The current treatment provides moderate improvement.  Hyperlipidemia This is a chronic problem. The problem is controlled. Exacerbating diseases include diabetes. There are no known factors aggravating his hyperlipidemia. Pertinent negatives include no chest pain or shortness of breath. The current treatment provides moderate improvement of lipids. Risk factors for coronary artery disease include dyslipidemia, diabetes mellitus, male sex and hypertension.  Diabetes He presents for his initial diabetic visit. He has type 2 diabetes mellitus. Pertinent negatives for hypoglycemia include no headaches. Pertinent negatives for diabetes include no chest pain and no visual change. Symptoms are stable. Risk factors for coronary artery disease include male sex, dyslipidemia and diabetes mellitus. Current diabetic treatment includes oral agent (monotherapy). He is compliant with treatment all of the time. His weight is stable. He is following a diabetic diet. There is no change in his home blood glucose trend. His breakfast blood glucose is taken between 5-6 am. His breakfast blood glucose range is generally 90-110 mg/dl. He does not see a podiatrist.Eye exam is current.     Review of Systems  Constitutional: Negative.   HENT: Negative.   Respiratory: Negative for shortness of breath.   Cardiovascular: Negative for chest pain and palpitations.  Genitourinary: Negative.   Musculoskeletal: Negative  for neck pain.  Neurological: Negative for headaches.  Psychiatric/Behavioral: Negative.   All other systems reviewed and are negative.      Objective:   Physical Exam  Constitutional: He is oriented to person, place, and time. He appears well-developed and well-nourished.  HENT:  Head: Normocephalic.  Eyes: Conjunctivae are normal. Pupils are equal, round, and reactive to light.  Neck: Normal range of motion.  Cardiovascular: Normal rate and regular rhythm.   Pulmonary/Chest: Effort normal and breath sounds normal.  Abdominal: Soft.  Musculoskeletal: Normal range of motion.  Neurological: He is alert and oriented to person, place, and time. He has normal reflexes.  Skin: Skin is warm.  Psychiatric: He has a normal mood and affect. His behavior is normal. Thought content normal.    BP 135/77 mmHg  Pulse 69  Temp(Src) 97 F (36.1 C) (Oral)  Ht '6\' 1"'  (1.854 m)  Wt 236 lb (107.049 kg)  BMI 31.14 kg/m2  Results for orders placed or performed in visit on 09/18/14  POCT glycosylated hemoglobin (Hb A1C)  Result Value Ref Range   Hemoglobin A1C 6.4%              Assessment & Plan:   1. Hyperlipidemia Low fat diet - NMR, lipoprofile - rosuvastatin (CRESTOR) 10 MG tablet; TAKE 1 TABLET (10 MG TOTAL) BY MOUTH DAILY.  Dispense: 30 tablet; Refill: 5  2. Essential hypertension Low salt diet - CMP14+EGFR - amLODipine (NORVASC) 10 MG tablet; Take 1 tablet (10 mg total) by mouth daily. 1/2qd  Dispense: 30 tablet; Refill: 5 - benazepril (LOTENSIN) 40 MG tablet; Take 1 tablet (40 mg total) by mouth daily. Taking 1/2qd  Dispense: 30 tablet; Refill: 5  3. Type 2 diabetes mellitus without complication Low carb diet,  Continue to exercise - POCT glycosylated hemoglobin (Hb A1C) - metFORMIN (GLUCOPHAGE) 500 MG tablet; Take 1 tablet (500 mg total) by mouth 2 (two) times daily with a meal.  Dispense: 60 tablet; Refill: 5    Labs pending Health maintenance reviewed Diet and  exercise encouraged Continue all meds Follow up  In 3 month   [place], FNP

## 2014-09-19 LAB — CMP14+EGFR
ALT: 28 IU/L (ref 0–44)
AST: 23 IU/L (ref 0–40)
Albumin/Globulin Ratio: 1.9 (ref 1.1–2.5)
Albumin: 4.5 g/dL (ref 3.6–4.8)
Alkaline Phosphatase: 59 IU/L (ref 39–117)
BUN/Creatinine Ratio: 13 (ref 10–22)
BUN: 15 mg/dL (ref 8–27)
Bilirubin Total: 1.9 mg/dL — ABNORMAL HIGH (ref 0.0–1.2)
CO2: 25 mmol/L (ref 18–29)
Calcium: 9.7 mg/dL (ref 8.6–10.2)
Chloride: 102 mmol/L (ref 97–108)
Creatinine, Ser: 1.15 mg/dL (ref 0.76–1.27)
GFR calc Af Amer: 76 mL/min/1.73
GFR calc non Af Amer: 66 mL/min/1.73
Globulin, Total: 2.4 g/dL (ref 1.5–4.5)
Glucose: 127 mg/dL — ABNORMAL HIGH (ref 65–99)
Potassium: 4.7 mmol/L (ref 3.5–5.2)
Sodium: 142 mmol/L (ref 134–144)
Total Protein: 6.9 g/dL (ref 6.0–8.5)

## 2014-09-19 LAB — NMR, LIPOPROFILE
Cholesterol: 125 mg/dL (ref 100–199)
HDL Cholesterol by NMR: 40 mg/dL
HDL Particle Number: 33 umol/L
LDL Particle Number: 755 nmol/L
LDL Size: 19.7 nm
LDL-C: 57 mg/dL (ref 0–99)
LP-IR Score: 60 — ABNORMAL HIGH
Small LDL Particle Number: 616 nmol/L — ABNORMAL HIGH
Triglycerides by NMR: 138 mg/dL (ref 0–149)

## 2014-09-20 ENCOUNTER — Other Ambulatory Visit: Payer: Self-pay | Admitting: Nurse Practitioner

## 2014-09-20 ENCOUNTER — Telehealth: Payer: Self-pay | Admitting: *Deleted

## 2014-09-20 NOTE — Telephone Encounter (Signed)
-----   Message from [facility], [place] sent at 09/19/2014 11:45 AM EST ----- Hgba1c discussed at appointment Kidney and liver function stable Cholesterol looks great Continue current meds- low fat diet and exercise and recheck in 3 months

## 2014-09-20 NOTE — Telephone Encounter (Signed)
Patient notified of lab results

## 2014-10-19 ENCOUNTER — Other Ambulatory Visit: Payer: Self-pay | Admitting: Nurse Practitioner

## 2014-11-28 ENCOUNTER — Ambulatory Visit (INDEPENDENT_AMBULATORY_CARE_PROVIDER_SITE_OTHER): Payer: Commercial Managed Care - HMO | Admitting: Nurse Practitioner

## 2014-11-28 ENCOUNTER — Encounter: Payer: Self-pay | Admitting: Nurse Practitioner

## 2014-11-28 VITALS — BP 147/80 | HR 75 | Temp 97.2°F | Ht 73.0 in | Wt 240.0 lb

## 2014-11-28 DIAGNOSIS — L0100 Impetigo, unspecified: Secondary | ICD-10-CM | POA: Diagnosis not present

## 2014-11-28 MED ORDER — CEPHALEXIN 500 MG PO CAPS: 500.0000 mg | ORAL_CAPSULE | Freq: Three times a day (TID) | ORAL | Status: DC

## 2014-11-28 MED ORDER — MUPIROCIN 2 % EX OINT: 1.0000 "application " | TOPICAL_OINTMENT | Freq: Two times a day (BID) | CUTANEOUS | Status: DC

## 2014-11-28 NOTE — Progress Notes (Signed)
  Subjective:     Jorge Garcia is a 69 y.o. male who presents for evaluation of a rash involving the leg and Bilaterally and on belly button. . Rash started 1 month ago. Lesions are erythematous with with blisters, and blistering in texture. Rash has not changed over time. Rash is pruritic. Associated symptoms: none. Patient denies: arthralgia, fever, headache, irritability and myalgia. Patient has not had contacts with similar rash. Patient has not had new exposures (soaps, lotions, laundry detergents, foods, medications, plants, insects or animals). He has applied hydrocortisone cream and Vaseline to the rash without relief.    The following portions of the patient's history were reviewed and updated as appropriate: allergies, current medications, past family history, past medical history, past social history, past surgical history and problem list.  Review of Systems Pertinent items are noted in HPI.    Objective:    BP 147/80 mmHg  Pulse 75  Temp(Src) 97.2 F (36.2 C) (Oral)  Ht 6\' 1"  (1.854 m)  Wt 240 lb (108.863 kg)  BMI 31.67 kg/m2 General:  alert, cooperative and appears stated age  Skin:  erythematous maculopapurlar moist rash with honey comb pustules on left lower leg. SiImilar rash that appers to have dried on mid abdominal wall and posterior right shin.      Assessment:    impetigo    Plan:  1. Impetigo Avoid scratching or picking RTO prn - mupirocin ointment (BACTROBAN) 2 %; Place 1 application into the nose 2 (two) times daily.  Dispense: 22 g; Refill: 0 - cephALEXin (KEFLEX) 500 MG capsule; Take 1 capsule (500 mg total) by mouth 3 (three) times daily.  Dispense: 30 capsule; Refill: 0  Mary-Margaret Hassell Done, FNP

## 2014-12-22 ENCOUNTER — Ambulatory Visit (INDEPENDENT_AMBULATORY_CARE_PROVIDER_SITE_OTHER): Payer: Commercial Managed Care - HMO | Admitting: Nurse Practitioner

## 2014-12-22 ENCOUNTER — Encounter: Payer: Self-pay | Admitting: Nurse Practitioner

## 2014-12-22 VITALS — BP 135/83 | HR 65 | Temp 97.6°F | Ht 73.0 in | Wt 238.0 lb

## 2014-12-22 DIAGNOSIS — Z23 Encounter for immunization: Secondary | ICD-10-CM | POA: Diagnosis not present

## 2014-12-22 DIAGNOSIS — K219 Gastro-esophageal reflux disease without esophagitis: Secondary | ICD-10-CM | POA: Diagnosis not present

## 2014-12-22 DIAGNOSIS — E1169 Type 2 diabetes mellitus with other specified complication: Secondary | ICD-10-CM

## 2014-12-22 DIAGNOSIS — I10 Essential (primary) hypertension: Secondary | ICD-10-CM | POA: Diagnosis not present

## 2014-12-22 DIAGNOSIS — N521 Erectile dysfunction due to diseases classified elsewhere: Secondary | ICD-10-CM

## 2014-12-22 DIAGNOSIS — E119 Type 2 diabetes mellitus without complications: Secondary | ICD-10-CM

## 2014-12-22 DIAGNOSIS — R3915 Urgency of urination: Secondary | ICD-10-CM

## 2014-12-22 DIAGNOSIS — E785 Hyperlipidemia, unspecified: Secondary | ICD-10-CM | POA: Diagnosis not present

## 2014-12-22 LAB — POCT GLYCOSYLATED HEMOGLOBIN (HGB A1C): Hemoglobin A1C: 6.4

## 2014-12-22 MED ORDER — HYDROCHLOROTHIAZIDE 25 MG PO TABS: 25.0000 mg | ORAL_TABLET | Freq: Every day | ORAL | Status: DC

## 2014-12-22 MED ORDER — ROSUVASTATIN CALCIUM 10 MG PO TABS: ORAL_TABLET | ORAL | Status: DC

## 2014-12-22 MED ORDER — METFORMIN HCL 500 MG PO TABS: 500.0000 mg | ORAL_TABLET | Freq: Two times a day (BID) | ORAL | Status: DC

## 2014-12-22 MED ORDER — AMLODIPINE BESYLATE 10 MG PO TABS: 10.0000 mg | ORAL_TABLET | Freq: Every day | ORAL | Status: DC

## 2014-12-22 MED ORDER — BENAZEPRIL HCL 40 MG PO TABS: 40.0000 mg | ORAL_TABLET | Freq: Every day | ORAL | Status: DC

## 2014-12-22 NOTE — Patient Instructions (Signed)
Exercise to Stay Healthy Exercise helps you become and stay healthy. EXERCISE IDEAS AND TIPS Choose exercises that:  You enjoy.  Fit into your day. You do not need to exercise really hard to be healthy. You can do exercises at a slow or medium level and stay healthy. You can:  Stretch before and after working out.  Try yoga, Pilates, or tai chi.  Lift weights.  Walk fast, swim, jog, run, climb stairs, bicycle, dance, or rollerskate.  Take aerobic classes. Exercises that burn about 150 calories:  Running 1  miles in 15 minutes.  Playing volleyball for 45 to 60 minutes.  Washing and waxing a car for 45 to 60 minutes.  Playing touch football for 45 minutes.  Walking 1  miles in 35 minutes.  Pushing a stroller 1  miles in 30 minutes.  Playing basketball for 30 minutes.  Raking leaves for 30 minutes.  Bicycling 5 miles in 30 minutes.  Walking 2 miles in 30 minutes.  Dancing for 30 minutes.  Shoveling snow for 15 minutes.  Swimming laps for 20 minutes.  Walking up stairs for 15 minutes.  Bicycling 4 miles in 15 minutes.  Gardening for 30 to 45 minutes.  Jumping rope for 15 minutes.  Washing windows or floors for 45 to 60 minutes. Document Released: 08/30/2010 Document Revised: 10/20/2011 Document Reviewed: 08/30/2010 ExitCare Patient Information 2015 [employer]. This information is not intended to replace advice given to you by your health care provider. Make sure you discuss any questions you have with your health care provider.  

## 2014-12-22 NOTE — Addendum Note (Signed)
Addended by: Rolena Infante on: 12/22/2014 11:24 AM   Modules accepted: Orders

## 2014-12-22 NOTE — Progress Notes (Signed)
Subjective:    Patient ID: Jorge Garcia, male    DOB: [birth year], 69 y.o.   MRN: [id number]   Patient  Here today for follow-up of chronic medical problems- only complaint today is urinary frequency and urgency- nocturia- denies dysuria  Hypertension This is a chronic problem. The current episode started more than 1 year ago. Pertinent negatives include no chest pain, headaches, neck pain, palpitations or shortness of breath. Risk factors for coronary artery disease include diabetes mellitus, dyslipidemia and male gender. Past treatments include calcium channel blockers. The current treatment provides moderate improvement. There is no history of CAD/MI, CVA or retinopathy.  Hyperlipidemia This is a chronic problem. The problem is controlled. Exacerbating diseases include diabetes. There are no known factors aggravating his hyperlipidemia. Pertinent negatives include no chest pain or shortness of breath. Current antihyperlipidemic treatment includes statins. The current treatment provides moderate improvement of lipids. Risk factors for coronary artery disease include dyslipidemia, diabetes mellitus, male sex and hypertension.  Diabetes He presents for his initial diabetic visit. He has type 2 diabetes mellitus. Pertinent negatives for hypoglycemia include no headaches. Pertinent negatives for diabetes include no chest pain and no visual change. Symptoms are stable. Pertinent negatives for diabetic complications include no CVA or retinopathy. Risk factors for coronary artery disease include male sex, dyslipidemia and diabetes mellitus. Current diabetic treatment includes oral agent (monotherapy). He is compliant with treatment all of the time. His weight is stable. He is following a diabetic diet. There is no change in his home blood glucose trend. His breakfast blood glucose is taken between 5-6 am. His breakfast blood glucose range is generally 90-110 mg/dl. He does not see a podiatrist.Eye exam is  current.  Erectile dysfunction secondary to diabetes viagra works well when he takes GERD Omeprazole works well to keep symptoms under control   Review of Systems  Constitutional: Negative.   HENT: Negative.   Respiratory: Negative for shortness of breath.   Cardiovascular: Negative for chest pain and palpitations.  Genitourinary: Negative.   Musculoskeletal: Negative for neck pain.  Neurological: Negative for headaches.  Psychiatric/Behavioral: Negative.   All other systems reviewed and are negative.      Objective:   Physical Exam  Constitutional: He is oriented to person, place, and time. He appears well-developed and well-nourished.  HENT:  Head: Normocephalic.  Eyes: Conjunctivae are normal. Pupils are equal, round, and reactive to light.  Neck: Normal range of motion.  Cardiovascular: Normal rate and regular rhythm.   Pulmonary/Chest: Effort normal and breath sounds normal.  Abdominal: Soft.  Musculoskeletal: Normal range of motion.  Neurological: He is alert and oriented to person, place, and time. He has normal reflexes.  Skin: Skin is warm.  Psychiatric: He has a normal mood and affect. His behavior is normal. Thought content normal.    BP 135/83 mmHg  Pulse 65  Temp(Src) 97.6 F (36.4 C) (Oral)  Ht '6\' 1"'  (1.854 m)  Wt 238 lb (107.956 kg)  BMI 31.41 kg/m2  Results for orders placed or performed in visit on 12/22/14  POCT glycosylated hemoglobin (Hb A1C)  Result Value Ref Range   Hemoglobin A1C 6.4              Assessment & Plan:    1. Hyperlipidemia Low fta diet - NMR, lipoprofile - rosuvastatin (CRESTOR) 10 MG tablet; TAKE 1 TABLET (10 MG TOTAL) BY MOUTH DAILY.  Dispense: 30 tablet; Refill: 5  2. Essential hypertension Do not add salt to diet - CMP14+EGFR - benazepril (  LOTENSIN) 40 MG tablet; Take 1 tablet (40 mg total) by mouth daily. Taking 1/2qd  Dispense: 30 tablet; Refill: 5 - amLODipine (NORVASC) 10 MG tablet; Take 1 tablet (10 mg  total) by mouth daily. 1/2qd  Dispense: 30 tablet; Refill: 5 - hydrochlorothiazide (HYDRODIURIL) 25 MG tablet; Take 1 tablet (25 mg total) by mouth daily.  Dispense: 90 tablet; Refill: 1  3. Type 2 diabetes mellitus without complication Watch carbs in diet - POCT glycosylated hemoglobin (Hb A1C) - metFORMIN (GLUCOPHAGE) 500 MG tablet; Take 1 tablet (500 mg total) by mouth 2 (two) times daily with a meal.  Dispense: 60 tablet; Refill: 5  4. Erectile dysfunction associated with type 2 diabetes mellitus  5. Gastroesophageal reflux disease without esophagitis Avoid spicy foods Do not eat 2 hours prior to bedtime   6. Urinary urgency - Ambulatory referral to Urology   Pneumonia vaccine 23 today Labs pending Health maintenance reviewed Diet and exercise encouraged Continue all meds Follow up  In 3 months   [place], FNP

## 2014-12-23 LAB — CMP14+EGFR
ALBUMIN: 4.5 g/dL (ref 3.6–4.8)
ALK PHOS: 60 IU/L (ref 39–117)
ALT: 19 IU/L (ref 0–44)
AST: 16 IU/L (ref 0–40)
Albumin/Globulin Ratio: 1.8 (ref 1.1–2.5)
BUN/Creatinine Ratio: 10 (ref 10–22)
BUN: 10 mg/dL (ref 8–27)
Bilirubin Total: 1.7 mg/dL — ABNORMAL HIGH (ref 0.0–1.2)
CALCIUM: 9.8 mg/dL (ref 8.6–10.2)
CO2: 26 mmol/L (ref 18–29)
CREATININE: 1.05 mg/dL (ref 0.76–1.27)
Chloride: 102 mmol/L (ref 97–108)
GFR calc non Af Amer: 74 mL/min/{1.73_m2} (ref >59)
GFR, EST AFRICAN AMERICAN: 85 mL/min/{1.73_m2} (ref >59)
Globulin, Total: 2.5 g/dL (ref 1.5–4.5)
Glucose: 126 mg/dL — ABNORMAL HIGH (ref 65–99)
Potassium: 4.7 mmol/L (ref 3.5–5.2)
SODIUM: 144 mmol/L (ref 134–144)
TOTAL PROTEIN: 7 g/dL (ref 6.0–8.5)

## 2014-12-23 LAB — NMR, LIPOPROFILE
Cholesterol: 119 mg/dL (ref 100–199)
HDL CHOLESTEROL BY NMR: 36 mg/dL — AB (ref >39)
HDL PARTICLE NUMBER: 30.9 umol/L (ref >=30.5)
LDL PARTICLE NUMBER: 871 nmol/L (ref <1000)
LDL Size: 20.1 nm (ref >20.5)
LDL-C: 49 mg/dL (ref 0–99)
LP-IR SCORE: 71 — AB (ref <=45)
Small LDL Particle Number: 521 nmol/L (ref <=527)
TRIGLYCERIDES BY NMR: 168 mg/dL — AB (ref 0–149)

## 2015-03-06 ENCOUNTER — Ambulatory Visit (INDEPENDENT_AMBULATORY_CARE_PROVIDER_SITE_OTHER): Payer: Commercial Managed Care - HMO | Admitting: Urology

## 2015-03-06 ENCOUNTER — Other Ambulatory Visit: Payer: Self-pay | Admitting: Nurse Practitioner

## 2015-03-06 DIAGNOSIS — N529 Male erectile dysfunction, unspecified: Secondary | ICD-10-CM | POA: Diagnosis not present

## 2015-03-06 DIAGNOSIS — R350 Frequency of micturition: Secondary | ICD-10-CM | POA: Diagnosis not present

## 2015-03-06 DIAGNOSIS — R3915 Urgency of urination: Secondary | ICD-10-CM

## 2015-03-15 ENCOUNTER — Other Ambulatory Visit: Payer: Self-pay | Admitting: Nurse Practitioner

## 2015-04-02 ENCOUNTER — Ambulatory Visit (INDEPENDENT_AMBULATORY_CARE_PROVIDER_SITE_OTHER): Payer: Commercial Managed Care - HMO | Admitting: Nurse Practitioner

## 2015-04-02 ENCOUNTER — Ambulatory Visit (INDEPENDENT_AMBULATORY_CARE_PROVIDER_SITE_OTHER): Payer: Commercial Managed Care - HMO

## 2015-04-02 ENCOUNTER — Encounter: Payer: Self-pay | Admitting: Nurse Practitioner

## 2015-04-02 VITALS — BP 133/74 | HR 65 | Temp 97.4°F | Ht 73.0 in | Wt 237.4 lb

## 2015-04-02 DIAGNOSIS — K219 Gastro-esophageal reflux disease without esophagitis: Secondary | ICD-10-CM | POA: Diagnosis not present

## 2015-04-02 DIAGNOSIS — Z720 Tobacco use: Secondary | ICD-10-CM | POA: Diagnosis not present

## 2015-04-02 DIAGNOSIS — I10 Essential (primary) hypertension: Secondary | ICD-10-CM | POA: Diagnosis not present

## 2015-04-02 DIAGNOSIS — Z87891 Personal history of nicotine dependence: Secondary | ICD-10-CM

## 2015-04-02 DIAGNOSIS — E119 Type 2 diabetes mellitus without complications: Secondary | ICD-10-CM

## 2015-04-02 DIAGNOSIS — E785 Hyperlipidemia, unspecified: Secondary | ICD-10-CM | POA: Diagnosis not present

## 2015-04-02 LAB — POCT GLYCOSYLATED HEMOGLOBIN (HGB A1C): Hemoglobin A1C: 6.8

## 2015-04-02 MED ORDER — AMLODIPINE BESYLATE 10 MG PO TABS: 10.0000 mg | ORAL_TABLET | Freq: Every day | ORAL | Status: DC

## 2015-04-02 MED ORDER — HYDROCHLOROTHIAZIDE 25 MG PO TABS: 25.0000 mg | ORAL_TABLET | Freq: Every day | ORAL | Status: DC

## 2015-04-02 MED ORDER — METFORMIN HCL 500 MG PO TABS: 500.0000 mg | ORAL_TABLET | Freq: Two times a day (BID) | ORAL | Status: DC

## 2015-04-02 MED ORDER — BENAZEPRIL HCL 40 MG PO TABS: 40.0000 mg | ORAL_TABLET | Freq: Every day | ORAL | Status: DC

## 2015-04-02 MED ORDER — OMEPRAZOLE 40 MG PO CPDR: DELAYED_RELEASE_CAPSULE | ORAL | Status: DC

## 2015-04-02 MED ORDER — ROSUVASTATIN CALCIUM 10 MG PO TABS: ORAL_TABLET | ORAL | Status: DC

## 2015-04-02 NOTE — Patient Instructions (Signed)

## 2015-04-02 NOTE — Progress Notes (Signed)
Subjective:    Patient ID: Jorge Garcia, male    DOB: 07/18/1946, 69 y.o.   MRN: [id number]   Patient  Here today for follow-up of chronic medical problems- only complaint today is urinary frequency and urgency- nocturia- denies dysuria  Hypertension This is a chronic problem. The current episode started more than 1 year ago. Pertinent negatives include no chest pain, headaches, neck pain, palpitations or shortness of breath. Risk factors for coronary artery disease include diabetes mellitus, dyslipidemia and male gender. Past treatments include calcium channel blockers. The current treatment provides moderate improvement. There is no history of CAD/MI, CVA or retinopathy.  Hyperlipidemia This is a chronic problem. The problem is controlled. Exacerbating diseases include diabetes. There are no known factors aggravating his hyperlipidemia. Pertinent negatives include no chest pain or shortness of breath. Current antihyperlipidemic treatment includes statins. The current treatment provides moderate improvement of lipids. Risk factors for coronary artery disease include dyslipidemia, diabetes mellitus, male sex and hypertension.  Diabetes He presents for his initial diabetic visit. He has type 2 diabetes mellitus. Pertinent negatives for hypoglycemia include no headaches. Pertinent negatives for diabetes include no chest pain and no visual change. Symptoms are stable. Pertinent negatives for diabetic complications include no CVA or retinopathy. Risk factors for coronary artery disease include male sex, dyslipidemia and diabetes mellitus. Current diabetic treatment includes oral agent (monotherapy). He is compliant with treatment all of the time. His weight is stable. He is following a diabetic diet. There is no change in his home blood glucose trend. His breakfast blood glucose is taken between 5-6 am. His breakfast blood glucose range is generally 90-110 mg/dl. He does not see a podiatrist.Eye exam is  current.  Erectile dysfunction secondary to diabetes viagra works well when he takes GERD Omeprazole works well to keep symptoms under control   Review of Systems  Constitutional: Negative.   HENT: Negative.   Respiratory: Negative for shortness of breath.   Cardiovascular: Negative for chest pain and palpitations.  Genitourinary: Negative.   Musculoskeletal: Negative for neck pain.  Neurological: Negative for headaches.  Psychiatric/Behavioral: Negative.   All other systems reviewed and are negative.      Objective:   Physical Exam  Constitutional: He is oriented to person, place, and time. He appears well-developed and well-nourished.  HENT:  Head: Normocephalic.  Eyes: Conjunctivae are normal. Pupils are equal, round, and reactive to light.  Neck: Normal range of motion.  Cardiovascular: Normal rate and regular rhythm.   Pulmonary/Chest: Effort normal and breath sounds normal.  Abdominal: Soft.  Musculoskeletal: Normal range of motion.  Neurological: He is alert and oriented to person, place, and time. He has normal reflexes.  Skin: Skin is warm.  Psychiatric: He has a normal mood and affect. His behavior is normal. Thought content normal.    BP 133/74 mmHg  Pulse 65  Temp(Src) 97.4 F (36.3 C) (Oral)  Ht '6\' 1"'  (1.854 m)  Wt 237 lb 6.4 oz (107.684 kg)  BMI 31.33 kg/m2  Results for orders placed or performed in visit on 04/02/15  POCT glycosylated hemoglobin (Hb A1C)  Result Value Ref Range   Hemoglobin A1C 6.8    Chest x ray- mild bronchitic changes-Preliminary reading by Ronnald Collum, FNP  [facility]   EKG-sinus bradycardia-Mary-Margaret Hassell Done, FNP      Assessment & Plan:    1. Hyperlipidemia Low fat diet - Lipid panel - rosuvastatin (CRESTOR) 10 MG tablet; TAKE 1 TABLET (10 MG TOTAL) BY MOUTH DAILY.  Dispense: 30 tablet;  Refill: 5  2. Essential hypertension Do not add salt to diet - CMP14+EGFR - benazepril (LOTENSIN) 40 MG tablet; Take 1 tablet (40 mg  total) by mouth daily. Taking 1/2qd  Dispense: 30 tablet; Refill: 5 - amLODipine (NORVASC) 10 MG tablet; Take 1 tablet (10 mg total) by mouth daily. 1/2qd  Dispense: 30 tablet; Refill: 5 - hydrochlorothiazide (HYDRODIURIL) 25 MG tablet; Take 1 tablet (25 mg total) by mouth daily.  Dispense: 90 tablet; Refill: 1 - EKG 12-Lead  3. Type 2 diabetes mellitus without complication Continue to watch carbe - POCT glycosylated hemoglobin (Hb A1C) - metFORMIN (GLUCOPHAGE) 500 MG tablet; Take 1 tablet (500 mg total) by mouth 2 (two) times daily with a meal.  Dispense: 60 tablet; Refill: 5  4. Gastroesophageal reflux disease without esophagitis Avoid spicy foods Do not eat 2 hours prior to bedtime - omeprazole (PRILOSEC) 40 MG capsule; TAKE ONE CAPSULE BY MOUTH ONCE DAILY BEFORE BREAKFAST  Dispense: 30 capsule; Refill: 2  5. Smoking hx - DG Chest 2 View; Future    Labs pending Health maintenance reviewed Diet and exercise encouraged Continue all meds Follow up  In 3 months   [place], FNP

## 2015-04-03 LAB — LIPID PANEL
Chol/HDL Ratio: 2.8 ratio units (ref 0.0–5.0)
Cholesterol, Total: 105 mg/dL (ref 100–199)
HDL: 38 mg/dL — ABNORMAL LOW (ref >39)
LDL Calculated: 36 mg/dL (ref 0–99)
Triglycerides: 156 mg/dL — ABNORMAL HIGH (ref 0–149)
VLDL Cholesterol Cal: 31 mg/dL (ref 5–40)

## 2015-04-03 LAB — CMP14+EGFR
ALBUMIN: 4.4 g/dL (ref 3.6–4.8)
ALK PHOS: 58 IU/L (ref 39–117)
ALT: 23 IU/L (ref 0–44)
AST: 18 IU/L (ref 0–40)
Albumin/Globulin Ratio: 2 (ref 1.1–2.5)
BUN / CREAT RATIO: 13 (ref 10–22)
BUN: 13 mg/dL (ref 8–27)
Bilirubin Total: 2.3 mg/dL — ABNORMAL HIGH (ref 0.0–1.2)
CHLORIDE: 99 mmol/L (ref 97–108)
CO2: 27 mmol/L (ref 18–29)
Calcium: 9.5 mg/dL (ref 8.6–10.2)
Creatinine, Ser: 1 mg/dL (ref 0.76–1.27)
GFR calc Af Amer: 90 mL/min/{1.73_m2} (ref >59)
GFR calc non Af Amer: 78 mL/min/{1.73_m2} (ref >59)
GLOBULIN, TOTAL: 2.2 g/dL (ref 1.5–4.5)
GLUCOSE: 121 mg/dL — AB (ref 65–99)
Potassium: 4.1 mmol/L (ref 3.5–5.2)
SODIUM: 142 mmol/L (ref 134–144)
Total Protein: 6.6 g/dL (ref 6.0–8.5)

## 2015-04-13 ENCOUNTER — Telehealth: Payer: Self-pay | Admitting: Nurse Practitioner

## 2015-04-13 ENCOUNTER — Encounter: Payer: Self-pay | Admitting: Family Medicine

## 2015-04-13 ENCOUNTER — Ambulatory Visit (INDEPENDENT_AMBULATORY_CARE_PROVIDER_SITE_OTHER): Payer: Commercial Managed Care - HMO | Admitting: Family Medicine

## 2015-04-13 DIAGNOSIS — S81811A Laceration without foreign body, right lower leg, initial encounter: Secondary | ICD-10-CM

## 2015-04-13 DIAGNOSIS — L089 Local infection of the skin and subcutaneous tissue, unspecified: Secondary | ICD-10-CM | POA: Diagnosis not present

## 2015-04-13 MED ORDER — MUPIROCIN 2 % EX OINT: TOPICAL_OINTMENT | CUTANEOUS | Status: DC

## 2015-04-13 NOTE — Progress Notes (Signed)
Subjective:  Patient ID: Jorge Garcia, male    DOB: [birth year]  Age: 69 y.o. MRN: [id number]  CC: Open Wound   HPI Jorge Garcia presents for lesion seems to have gotten worse. He had a sore there that seemed to be healing he doesn't know how it happened. But several days ago it seemed to spread and now it will not heal. He denies redness swelling pain and pus.  History Jorge Garcia has a past medical history of Diabetes mellitus without complication; Hypertension; Hyperlipidemia; History of CVA (cerebrovascular accident); Vitamin D deficiency; Internal hemorrhoids; and Tubulovillous adenoma of colon (2009).   He has past surgical history that includes Hemorrhoid surgery (1978).   His family history includes Diabetes in his father and mother; Hypertension in his father and mother.He reports that he quit smoking about 9 years ago. He has never used smokeless tobacco. He reports that he does not drink alcohol or use illicit drugs.  Outpatient Prescriptions Prior to Visit  Medication Sig Dispense Refill  . amLODipine (NORVASC) 10 MG tablet Take 1 tablet (10 mg total) by mouth daily. 1/2qd 30 tablet 5  . aspirin EC 81 MG tablet Take 81 mg by mouth daily.    . benazepril (LOTENSIN) 40 MG tablet Take 1 tablet (40 mg total) by mouth daily. Taking 1/2qd 30 tablet 5  . cholecalciferol (VITAMIN D) 1000 UNITS tablet Take 1,000 Units by mouth daily.    . fish oil-omega-3 fatty acids 1000 MG capsule Take 1 g by mouth daily.      Marland Kitchen glucose blood (BAYER CONTOUR TEST) test strip Use to check blood sugar qd. Dx E11.9 100 each 4  . hydrochlorothiazide (HYDRODIURIL) 25 MG tablet Take 1 tablet (25 mg total) by mouth daily. 90 tablet 1  . metFORMIN (GLUCOPHAGE) 500 MG tablet Take 1 tablet (500 mg total) by mouth 2 (two) times daily with a meal. 60 tablet 5  . MICROLET LANCETS MISC USE TO CHECK BLOOD SUGAR TWICE A DAY AS INSTRUCTED 100 each 1  . omeprazole (PRILOSEC) 40 MG capsule TAKE ONE CAPSULE BY MOUTH ONCE  DAILY BEFORE BREAKFAST 30 capsule 2  . rosuvastatin (CRESTOR) 10 MG tablet TAKE 1 TABLET (10 MG TOTAL) BY MOUTH DAILY. 30 tablet 5  . sildenafil (VIAGRA) 100 MG tablet 1/2-1 po Prn (Patient not taking: Reported on 04/13/2015) 9 tablet 11   No facility-administered medications prior to visit.    ROS Review of Systems  Constitutional: Negative for fever, chills, activity change and appetite change.  HENT: Negative for congestion, dental problem, ear pain, hearing loss and nosebleeds.   Respiratory: Negative for chest tightness and shortness of breath.   Cardiovascular: Negative for chest pain and palpitations.  Skin: Positive for wound. Negative for color change, pallor and rash.    Objective:  BP 142/78 mmHg  Pulse 86  Temp(Src) 97.4 F (36.3 C) (Oral)  Wt 232 lb (105.235 kg)  BP Readings from Last 3 Encounters:  04/13/15 142/78  04/02/15 133/74  12/22/14 135/83    Wt Readings from Last 3 Encounters:  04/13/15 232 lb (105.235 kg)  04/02/15 237 lb 6.4 oz (107.684 kg)  12/22/14 238 lb (107.956 kg)     Physical Exam  Constitutional: He appears well-developed and well-nourished. No distress.  HENT:  Head: Atraumatic.  Eyes: Pupils are equal, round, and reactive to light.  Cardiovascular: Normal rate, regular rhythm and intact distal pulses.   No murmur heard. Pulmonary/Chest: Breath sounds normal. No respiratory distress.  Neurological: He is alert.  Skin:  There is a 1 cm area of denuded epithelium just below a 2 mm papule located on the anterior lower right leg. It is not swollen. It is not erythematous. It is mildly tender to touch. There is no exudate. It is weeping slight amounts of serous fluid.    Lab Results  Component Value Date   HGBA1C 6.8 04/02/2015   HGBA1C 6.4 12/22/2014   HGBA1C 6.4 09/18/2014    Lab Results  Component Value Date   WBC 11.8* 09/07/2013   HGB 16.0 09/07/2013   HCT 49.4 09/07/2013   PLT 257 05/01/2009   GLUCOSE 121* 04/02/2015    CHOL 105 04/02/2015   TRIG 156* 04/02/2015   HDL 38* 04/02/2015   LDLCALC 36 04/02/2015   ALT 23 04/02/2015   AST 18 04/02/2015   NA 142 04/02/2015   K 4.1 04/02/2015   CL 99 04/02/2015   CREATININE 1.00 04/02/2015   BUN 13 04/02/2015   CO2 27 04/02/2015   PSA 1.8 05/27/2013   HGBA1C 6.8 04/02/2015    Ct Chest W Contrast  07/18/2010   Clinical Data: Pulmonary nodules seen on chest x-ray   CT CHEST WITH CONTRAST   Technique:  Multidetector CT imaging of the chest was performed following the standard protocol during bolus administration of intravenous contrast.   Contrast: 75 ml Omnipaque 300 IV.   Comparison: Report from [place] study 07/02/2010   Findings: No suspicious pulmonary nodules within the lungs. Minimal vague ground-glass densities noted in the lingula, likely scarring.  This could be related to the density seen on prior chest x-ray.  No pleural effusions.   There is tortuosity of the thoracic aorta which is normal caliber. Heart is upper limits normal in size. No mediastinal, hilar, or axillary adenopathy.  Visualized thyroid and chest wall soft tissues unremarkable. Imaging into the upper abdomen shows no acute findings.  Small gallstones suspected within the gallbladder.   Degenerative changes are seen in the thoracic spine.  No acute bony abnormality.   IMPRESSION: Minimal ground-glass density noted in the lingula, most likely scarring.  This may be related to the density seen on prior chest x- ray.  No suspicious pulmonary nodules.  Provider: Cleda Mccreedy   Assessment & Plan:   Jorge Garcia was seen today for open wound.  Diagnoses and all orders for this visit:  Skin tear of lower leg without complication, right, initial encounter -     mupirocin ointment (BACTROBAN) 2 %; Apply BID to affected area -     Aerobic culture   I have discontinued Jorge Garcia sildenafil. I am also having him start on mupirocin ointment. Additionally, I am having him maintain his fish  oil-omega-3 fatty acids, aspirin EC, cholecalciferol, glucose blood, MICROLET LANCETS, benazepril, metFORMIN, amLODipine, rosuvastatin, omeprazole, and hydrochlorothiazide.  Meds ordered this encounter  Medications  . mupirocin ointment (BACTROBAN) 2 %    Sig: Apply BID to affected area    Dispense:  30 g    Refill:  0     Follow-up: No Follow-up on file.  Claretta Fraise, M.D.

## 2015-04-17 ENCOUNTER — Other Ambulatory Visit: Payer: Self-pay | Admitting: Nurse Practitioner

## 2015-04-17 DIAGNOSIS — I10 Essential (primary) hypertension: Secondary | ICD-10-CM

## 2015-04-19 LAB — AEROBIC CULTURE

## 2015-05-04 ENCOUNTER — Ambulatory Visit (INDEPENDENT_AMBULATORY_CARE_PROVIDER_SITE_OTHER): Payer: Commercial Managed Care - HMO | Admitting: Nurse Practitioner

## 2015-05-04 ENCOUNTER — Encounter: Payer: Self-pay | Admitting: Nurse Practitioner

## 2015-05-04 VITALS — BP 138/72 | HR 92 | Temp 96.9°F | Ht 73.0 in | Wt 234.0 lb

## 2015-05-04 DIAGNOSIS — R238 Other skin changes: Secondary | ICD-10-CM | POA: Diagnosis not present

## 2015-05-04 MED ORDER — SULFAMETHOXAZOLE-TRIMETHOPRIM 800-160 MG PO TABS: 1.0000 | ORAL_TABLET | Freq: Two times a day (BID) | ORAL | Status: DC

## 2015-05-04 NOTE — Patient Instructions (Signed)
    Blisters Blisters are fluid-filled sacs that form within the skin. Common causes of blistering are friction, burns, and exposure to irritating chemicals. The fluid in the blister protects the underlying damaged skin. Most of the time it is not recommended that you open blisters. When a blister is opened, there is an increased chance for infection. Usually, a blister will open on its own. They then dry up and peel off within 10 days. If the blister is tense and uncomfortable (painful) the fluid may be drained. If it is drained the roof of the blister should be left intact. The draining should only be done by a medical professional under aseptic conditions. Poorly fitting shoes and boots can cause blisters by being too tight or too loose. Wearing extra socks or using tape, bandages, or pads over the blister-prone area helps prevent the problem by reducing friction. Blisters heal more slowly if you have diabetes or if you have problems with your circulation. You need to be careful about medical follow-up to prevent infection. HOME CARE INSTRUCTIONS  Protect areas where blisters have formed until the skin is healed. Use a special bandage with a hole cut in the middle around the blister. This reduces pressure and friction. When the blister breaks, trim off the loose skin and keep the area clean by washing it with soap daily. Soaking the blister or broken-open blister with diluted vinegar twice daily for 15 minutes will dry it up and speed the healing. Use 3 tablespoons of white vinegar per quart of water (45 mL white vinegar per liter of water). An antibiotic ointment and a bandage can be used to cover the area after soaking.  SEEK MEDICAL CARE IF:   You develop increased redness, pain, swelling, or drainage in the blistered area.  You develop a pus-like discharge from the blistered area, chills, or a fever. MAKE SURE YOU:   Understand these instructions.  Will watch your condition.  Will get help  right away if you are not doing well or get worse. Document Released: 09/04/2004 Document Revised: 10/20/2011 Document Reviewed: 08/02/2008 ExitCare Patient Information 2015 [employer]. This information is not intended to replace advice given to you by your health care Jorge Garcia. Make sure you discuss any questions you have with your health care Jorge Garcia.  

## 2015-05-04 NOTE — Progress Notes (Signed)
   Subjective:    Patient ID: Sharvil Hoey, male    DOB: 09-12-1945, 69 y.o.   MRN: [id number]  HPI Patient comes in today c/ovesicular lesion left lower leg- noticed it about 3 days ago- had similar thing appearing in same place several months ago- denies getting bitten by anything.    Review of Systems  Constitutional: Negative.   HENT: Negative.   Respiratory: Negative.   Cardiovascular: Negative.   Genitourinary: Negative.   Neurological: Negative.   Psychiatric/Behavioral: Negative.   All other systems reviewed and are negative.      Objective:   Physical Exam  Constitutional: He is oriented to person, place, and time. He appears well-developed and well-nourished.  Cardiovascular: Normal rate, regular rhythm and normal heart sounds.   Pulmonary/Chest: Effort normal and breath sounds normal.  Neurological: He is alert and oriented to person, place, and time.  Skin: Skin is warm.  3cm vesicular lesion left lower leg- vesicle popped and dead skin removed.    BP 138/72 mmHg  Pulse 92  Temp(Src) 96.9 F (36.1 C) (Oral)  Ht 6\' 1"  (1.854 m)  Wt 234 lb (106.142 kg)  BMI 30.88 kg/m2       Assessment & Plan:     ICD-9-CM ICD-10-CM   1. Vesicular lesion 709.8 R23.8    Meds ordered this encounter  Medications  . sulfamethoxazole-trimethoprim (BACTRIM DS) 800-160 MG per tablet    Sig: Take 1 tablet by mouth 2 (two) times daily.    Dispense:  14 tablet    Refill:  0    Order Specific Question:  Supervising Provider    Answer:  Joycelyn Man   Clean with antibacterial soap BID Keep clean and dry RTO prn  Mary-Margaret Hassell Done, FNP

## 2015-07-19 ENCOUNTER — Ambulatory Visit (INDEPENDENT_AMBULATORY_CARE_PROVIDER_SITE_OTHER): Payer: Commercial Managed Care - HMO | Admitting: Nurse Practitioner

## 2015-07-19 ENCOUNTER — Encounter: Payer: Self-pay | Admitting: Nurse Practitioner

## 2015-07-19 VITALS — BP 128/82 | HR 65 | Temp 97.1°F | Ht 73.0 in | Wt 236.0 lb

## 2015-07-19 DIAGNOSIS — I10 Essential (primary) hypertension: Secondary | ICD-10-CM

## 2015-07-19 DIAGNOSIS — Z1212 Encounter for screening for malignant neoplasm of rectum: Secondary | ICD-10-CM | POA: Diagnosis not present

## 2015-07-19 DIAGNOSIS — K219 Gastro-esophageal reflux disease without esophagitis: Secondary | ICD-10-CM | POA: Diagnosis not present

## 2015-07-19 DIAGNOSIS — E785 Hyperlipidemia, unspecified: Secondary | ICD-10-CM | POA: Diagnosis not present

## 2015-07-19 DIAGNOSIS — Z6829 Body mass index (BMI) 29.0-29.9, adult: Secondary | ICD-10-CM | POA: Insufficient documentation

## 2015-07-19 DIAGNOSIS — Z6831 Body mass index (BMI) 31.0-31.9, adult: Secondary | ICD-10-CM | POA: Diagnosis not present

## 2015-07-19 DIAGNOSIS — E119 Type 2 diabetes mellitus without complications: Secondary | ICD-10-CM | POA: Diagnosis not present

## 2015-07-19 LAB — POCT GLYCOSYLATED HEMOGLOBIN (HGB A1C): Hemoglobin A1C: 6.5

## 2015-07-19 MED ORDER — AMLODIPINE BESYLATE 10 MG PO TABS: 10.0000 mg | ORAL_TABLET | Freq: Every day | ORAL | Status: DC

## 2015-07-19 MED ORDER — BENAZEPRIL HCL 40 MG PO TABS: 40.0000 mg | ORAL_TABLET | Freq: Every day | ORAL | Status: DC

## 2015-07-19 MED ORDER — ROSUVASTATIN CALCIUM 10 MG PO TABS: ORAL_TABLET | ORAL | Status: DC

## 2015-07-19 MED ORDER — OMEPRAZOLE 40 MG PO CPDR: DELAYED_RELEASE_CAPSULE | ORAL | Status: DC

## 2015-07-19 MED ORDER — HYDROCHLOROTHIAZIDE 25 MG PO TABS: 25.0000 mg | ORAL_TABLET | Freq: Every day | ORAL | Status: DC

## 2015-07-19 NOTE — Patient Instructions (Signed)
Diabetes and Foot Care Diabetes may cause you to have problems because of poor blood supply (circulation) to your feet and legs. This may cause the skin on your feet to become thinner, break easier, and heal more slowly. Your skin may become dry, and the skin may peel and crack. You may also have nerve damage in your legs and feet causing decreased feeling in them. You may not notice minor injuries to your feet that could lead to infections or more serious problems. Taking care of your feet is one of the most important things you can do for yourself.  HOME CARE INSTRUCTIONS  Wear shoes at all times, even in the house. Do not go barefoot. Bare feet are easily injured.  Check your feet daily for blisters, cuts, and redness. If you cannot see the bottom of your feet, use a mirror or ask someone for help.  Wash your feet with warm water (do not use hot water) and mild soap. Then pat your feet and the areas between your toes until they are completely dry. Do not soak your feet as this can dry your skin.  Apply a moisturizing lotion or petroleum jelly (that does not contain alcohol and is unscented) to the skin on your feet and to dry, brittle toenails. Do not apply lotion between your toes.  Trim your toenails straight across. Do not dig under them or around the cuticle. File the edges of your nails with an emery board or nail file.  Do not cut corns or calluses or try to remove them with medicine.  Wear clean socks or stockings every day. Make sure they are not too tight. Do not wear knee-high stockings since they may decrease blood flow to your legs.  Wear shoes that fit properly and have enough cushioning. To break in new shoes, wear them for just a few hours a day. This prevents you from injuring your feet. Always look in your shoes before you put them on to be sure there are no objects inside.  Do not cross your legs. This may decrease the blood flow to your feet.  If you find a minor scrape,  cut, or break in the skin on your feet, keep it and the skin around it clean and dry. These areas may be cleansed with mild soap and water. Do not cleanse the area with peroxide, alcohol, or iodine.  When you remove an adhesive bandage, be sure not to damage the skin around it.  If you have a wound, look at it several times a day to make sure it is healing.  Do not use heating pads or hot water bottles. They may burn your skin. If you have lost feeling in your feet or legs, you may not know it is happening until it is too late.  Make sure your health care provider performs a complete foot exam at least annually or more often if you have foot problems. Report any cuts, sores, or bruises to your health care provider immediately. SEEK MEDICAL CARE IF:   You have an injury that is not healing.  You have cuts or breaks in the skin.  You have an ingrown nail.  You notice redness on your legs or feet.  You feel burning or tingling in your legs or feet.  You have pain or cramps in your legs and feet.  Your legs or feet are numb.  Your feet always feel cold. SEEK IMMEDIATE MEDICAL CARE IF:   There is increasing redness,   swelling, or pain in or around a wound.  There is a red line that goes up your leg.  Pus is coming from a wound.  You develop a fever or as directed by your health care provider.  You notice a bad smell coming from an ulcer or wound.   This information is not intended to replace advice given to you by your health care provider. Make sure you discuss any questions you have with your health care provider.   Document Released: 07/25/2000 Document Revised: 03/30/2013 Document Reviewed: 01/04/2013 Elsevier Interactive Patient Education 2016 [employer]  

## 2015-07-19 NOTE — Addendum Note (Signed)
Addended by: Chevis Pretty on: 07/19/2015 02:01 PM   Modules accepted: Orders

## 2015-07-19 NOTE — Progress Notes (Signed)
Subjective:    Patient ID: Jorge Garcia, male    DOB: Aug 07, 1946, 69 y.o.   MRN: [id number]   Patient  Here today for follow-up of chronic medical problems-   Hypertension This is a chronic problem. The current episode started more than 1 year ago. Pertinent negatives include no chest pain, headaches, neck pain, palpitations or shortness of breath. Risk factors for coronary artery disease include diabetes mellitus, dyslipidemia and male gender. Past treatments include calcium channel blockers. The current treatment provides moderate improvement. There is no history of CAD/MI, CVA or retinopathy.  Hyperlipidemia This is a chronic problem. The problem is controlled. Exacerbating diseases include diabetes. There are no known factors aggravating his hyperlipidemia. Pertinent negatives include no chest pain or shortness of breath. Current antihyperlipidemic treatment includes statins. The current treatment provides moderate improvement of lipids. Risk factors for coronary artery disease include dyslipidemia, diabetes mellitus, male sex and hypertension.  Diabetes He presents for his initial diabetic visit. He has type 2 diabetes mellitus. Pertinent negatives for hypoglycemia include no headaches. Pertinent negatives for diabetes include no chest pain and no visual change. Symptoms are stable. Pertinent negatives for diabetic complications include no CVA or retinopathy. Risk factors for coronary artery disease include male sex, dyslipidemia and diabetes mellitus. Current diabetic treatment includes oral agent (monotherapy). He is compliant with treatment all of the time. His weight is stable. He is following a diabetic diet. There is no change in his home blood glucose trend. His breakfast blood glucose is taken between 5-6 am. His breakfast blood glucose range is generally 90-110 mg/dl. He does not see a podiatrist.Eye exam is current.  Erectile dysfunction secondary to diabetes viagra works well when he  takes GERD Omeprazole works well to keep symptoms under control   Review of Systems  Constitutional: Negative.   HENT: Negative.   Respiratory: Negative for shortness of breath.   Cardiovascular: Negative for chest pain and palpitations.  Genitourinary: Negative.   Musculoskeletal: Negative for neck pain.  Neurological: Negative for headaches.  Psychiatric/Behavioral: Negative.   All other systems reviewed and are negative.      Objective:   Physical Exam  Constitutional: He is oriented to person, place, and time. He appears well-developed and well-nourished.  HENT:  Head: Normocephalic.  Eyes: Conjunctivae are normal. Pupils are equal, round, and reactive to light.  Neck: Normal range of motion.  Cardiovascular: Normal rate and regular rhythm.   Pulmonary/Chest: Effort normal and breath sounds normal.  Abdominal: Soft.  Musculoskeletal: Normal range of motion.  Neurological: He is alert and oriented to person, place, and time. He has normal reflexes.  Skin: Skin is warm.  Psychiatric: He has a normal mood and affect. His behavior is normal. Thought content normal.   BP 128/82 mmHg  Pulse 65  Temp(Src) 97.1 F (36.2 C) (Oral)  Ht '6\' 1"'  (1.854 m)  Wt 236 lb (107.049 kg)  BMI 31.14 kg/m2   Results for orders placed or performed in visit on 07/19/15  POCT glycosylated hemoglobin (Hb A1C)  Result Value Ref Range   Hemoglobin A1C 6.5           Assessment & Plan:   1. Hyperlipidemia Low fat diet - Lipid panel - rosuvastatin (CRESTOR) 10 MG tablet; TAKE 1 TABLET (10 MG TOTAL) BY MOUTH DAILY.  Dispense: 30 tablet; Refill: 5  2. Essential hypertension Do  Not add salt to diet - CMP14+EGFR - hydrochlorothiazide (HYDRODIURIL) 25 MG tablet; Take 1 tablet (25 mg total) by mouth daily.  Dispense: 90 tablet; Refill: 1 - benazepril (LOTENSIN) 40 MG tablet; Take 1 tablet (40 mg total) by mouth daily. Taking 1/2qd  Dispense: 30 tablet; Refill: 5 - amLODipine (NORVASC) 10  MG tablet; Take 1 tablet (10 mg total) by mouth daily. 1/2qd  Dispense: 30 tablet; Refill: 5  3. Type 2 diabetes mellitus without complication, without long-term current use of insulin (HCC) Continue carb counting - POCT glycosylated hemoglobin (Hb A1C) - Microalbumin / creatinine urine ratio  4. BMI 31.0-31.9,adult Discussed diet and exercise for person with BMI >25 Will recheck weight in 3-6 months   5. Gastroesophageal reflux disease without esophagitis Avoid spicy foods Do not eat 2 hours prior to bedtime - omeprazole (PRILOSEC) 40 MG capsule; TAKE ONE CAPSULE BY MOUTH ONCE DAILY BEFORE BREAKFAST  Dispense: 30 capsule; Refill: 2    Labs pending Health maintenance reviewed Diet and exercise encouraged Continue all meds Follow up  In 3 months   [place], FNP

## 2015-07-19 NOTE — Addendum Note (Signed)
Addended by: Earlene Plater on: 07/19/2015 02:40 PM   Modules accepted: Orders

## 2015-07-20 LAB — CMP14+EGFR
A/G RATIO: 2.1 (ref 1.1–2.5)
ALT: 22 IU/L (ref 0–44)
AST: 19 IU/L (ref 0–40)
Albumin: 4.7 g/dL (ref 3.6–4.8)
Alkaline Phosphatase: 57 IU/L (ref 39–117)
BILIRUBIN TOTAL: 1.5 mg/dL — AB (ref 0.0–1.2)
BUN/Creatinine Ratio: 15 (ref 10–22)
BUN: 14 mg/dL (ref 8–27)
CALCIUM: 9.6 mg/dL (ref 8.6–10.2)
CHLORIDE: 100 mmol/L (ref 97–106)
CO2: 26 mmol/L (ref 18–29)
Creatinine, Ser: 0.93 mg/dL (ref 0.76–1.27)
GFR, EST AFRICAN AMERICAN: 98 mL/min/{1.73_m2} (ref >59)
GFR, EST NON AFRICAN AMERICAN: 85 mL/min/{1.73_m2} (ref >59)
GLOBULIN, TOTAL: 2.2 g/dL (ref 1.5–4.5)
Glucose: 123 mg/dL — ABNORMAL HIGH (ref 65–99)
POTASSIUM: 4 mmol/L (ref 3.5–5.2)
SODIUM: 142 mmol/L (ref 136–144)
TOTAL PROTEIN: 6.9 g/dL (ref 6.0–8.5)

## 2015-07-20 LAB — LIPID PANEL
CHOLESTEROL TOTAL: 114 mg/dL (ref 100–199)
Chol/HDL Ratio: 2.9 ratio units (ref 0.0–5.0)
HDL: 39 mg/dL — ABNORMAL LOW (ref >39)
LDL CALC: 53 mg/dL (ref 0–99)
Triglycerides: 108 mg/dL (ref 0–149)
VLDL Cholesterol Cal: 22 mg/dL (ref 5–40)

## 2015-07-20 LAB — MICROALBUMIN / CREATININE URINE RATIO
Creatinine, Urine: 297.9 mg/dL
MICROALB/CREAT RATIO: 7.3 mg/g{creat} (ref 0.0–30.0)
Microalbumin, Urine: 21.8 ug/mL

## 2015-07-21 LAB — FECAL OCCULT BLOOD, IMMUNOCHEMICAL: Fecal Occult Bld: NEGATIVE

## 2015-07-24 ENCOUNTER — Other Ambulatory Visit: Payer: Self-pay | Admitting: *Deleted

## 2015-07-24 DIAGNOSIS — I10 Essential (primary) hypertension: Secondary | ICD-10-CM

## 2015-07-24 MED ORDER — AMLODIPINE BESYLATE 10 MG PO TABS: 10.0000 mg | ORAL_TABLET | Freq: Every day | ORAL | Status: DC

## 2015-07-24 MED ORDER — BENAZEPRIL HCL 40 MG PO TABS: 40.0000 mg | ORAL_TABLET | Freq: Every day | ORAL | Status: DC

## 2015-09-06 ENCOUNTER — Other Ambulatory Visit: Payer: Self-pay | Admitting: Nurse Practitioner

## 2015-09-06 DIAGNOSIS — L989 Disorder of the skin and subcutaneous tissue, unspecified: Secondary | ICD-10-CM

## 2015-09-07 ENCOUNTER — Other Ambulatory Visit: Payer: Self-pay | Admitting: Nurse Practitioner

## 2015-09-21 ENCOUNTER — Other Ambulatory Visit: Payer: Self-pay | Admitting: Nurse Practitioner

## 2015-09-21 MED ORDER — GLUCOSE BLOOD VI STRP: ORAL_STRIP | Status: DC

## 2015-09-21 NOTE — Telephone Encounter (Signed)
done

## 2015-10-08 DIAGNOSIS — L259 Unspecified contact dermatitis, unspecified cause: Secondary | ICD-10-CM | POA: Diagnosis not present

## 2015-10-08 DIAGNOSIS — L309 Dermatitis, unspecified: Secondary | ICD-10-CM | POA: Diagnosis not present

## 2015-10-18 ENCOUNTER — Other Ambulatory Visit: Payer: Self-pay

## 2015-10-18 DIAGNOSIS — E119 Type 2 diabetes mellitus without complications: Secondary | ICD-10-CM

## 2015-10-26 ENCOUNTER — Other Ambulatory Visit: Payer: Self-pay

## 2015-10-26 MED ORDER — BLOOD GLUCOSE MONITOR KIT: PACK | Status: DC

## 2015-10-30 ENCOUNTER — Encounter: Payer: Self-pay | Admitting: Nurse Practitioner

## 2015-10-30 ENCOUNTER — Ambulatory Visit (INDEPENDENT_AMBULATORY_CARE_PROVIDER_SITE_OTHER): Payer: Commercial Managed Care - HMO | Admitting: Nurse Practitioner

## 2015-10-30 VITALS — BP 131/79 | HR 69 | Temp 97.2°F | Ht 73.0 in | Wt 237.0 lb

## 2015-10-30 DIAGNOSIS — E119 Type 2 diabetes mellitus without complications: Secondary | ICD-10-CM | POA: Diagnosis not present

## 2015-10-30 DIAGNOSIS — K219 Gastro-esophageal reflux disease without esophagitis: Secondary | ICD-10-CM | POA: Diagnosis not present

## 2015-10-30 DIAGNOSIS — E785 Hyperlipidemia, unspecified: Secondary | ICD-10-CM | POA: Diagnosis not present

## 2015-10-30 DIAGNOSIS — I10 Essential (primary) hypertension: Secondary | ICD-10-CM | POA: Diagnosis not present

## 2015-10-30 DIAGNOSIS — Z1159 Encounter for screening for other viral diseases: Secondary | ICD-10-CM

## 2015-10-30 LAB — BAYER DCA HB A1C WAIVED: HB A1C: 6.6 % (ref <7.0)

## 2015-10-30 MED ORDER — METFORMIN HCL 500 MG PO TABS: 500.0000 mg | ORAL_TABLET | Freq: Two times a day (BID) | ORAL | Status: DC

## 2015-10-30 MED ORDER — OMEPRAZOLE 40 MG PO CPDR: DELAYED_RELEASE_CAPSULE | ORAL | Status: DC

## 2015-10-30 NOTE — Progress Notes (Signed)
Subjective:    Patient ID: Jorge Garcia, male    DOB: 1946-04-21, 70 y.o.   MRN: [id number]    Patient here today for follow up of chronic medical problems.  Outpatient Encounter Prescriptions as of 10/30/2015  Medication Sig  . amLODipine (NORVASC) 10 MG tablet Take 1 tablet (10 mg total) by mouth daily.  Marland Kitchen aspirin EC 81 MG tablet Take 81 mg by mouth daily.  . benazepril (LOTENSIN) 40 MG tablet Take 1 tablet (40 mg total) by mouth daily.  . blood glucose meter kit and supplies KIT Use up to four times daily as directed. (FOR ICD-10 E11.9)  . cholecalciferol (VITAMIN D) 1000 UNITS tablet Take 1,000 Units by mouth daily.  . fish oil-omega-3 fatty acids 1000 MG capsule Take 1 g by mouth daily.    Marland Kitchen glucose blood (BAYER CONTOUR TEST) test strip Use to check blood sugar qd. Dx E11.9  . hydrochlorothiazide (HYDRODIURIL) 25 MG tablet Take 1 tablet (25 mg total) by mouth daily.  . metFORMIN (GLUCOPHAGE) 500 MG tablet Take 1 tablet (500 mg total) by mouth 2 (two) times daily with a meal.  . MICROLET LANCETS MISC USE TO CHECK BLOOD SUGAR TWICE A DAY AS INSTRUCTED  . mupirocin ointment (BACTROBAN) 2 % Apply BID to affected area  . omeprazole (PRILOSEC) 40 MG capsule TAKE ONE CAPSULE BY MOUTH ONCE DAILY BEFORE BREAKFAST  . rosuvastatin (CRESTOR) 10 MG tablet TAKE 1 TABLET (10 MG TOTAL) BY MOUTH DAILY.   No facility-administered encounter medications on file as of 10/30/2015.      Hypertension This is a chronic problem. The current episode started more than 1 year ago. Pertinent negatives include no chest pain, headaches, neck pain, palpitations or shortness of breath. Risk factors for coronary artery disease include diabetes mellitus, dyslipidemia and male gender. Past treatments include calcium channel blockers. The current treatment provides moderate improvement. There is no history of CAD/MI, CVA or retinopathy.  Hyperlipidemia This is a chronic problem. The problem is controlled. Exacerbating  diseases include diabetes. There are no known factors aggravating his hyperlipidemia. Pertinent negatives include no chest pain or shortness of breath. Current antihyperlipidemic treatment includes statins. The current treatment provides moderate improvement of lipids. Risk factors for coronary artery disease include dyslipidemia, diabetes mellitus, male sex and hypertension.  Diabetes He presents for his initial diabetic visit. He has type 2 diabetes mellitus. Pertinent negatives for hypoglycemia include no headaches. Pertinent negatives for diabetes include no chest pain and no visual change. Symptoms are stable. Pertinent negatives for diabetic complications include no CVA or retinopathy. Risk factors for coronary artery disease include male sex, dyslipidemia and diabetes mellitus. Current diabetic treatment includes oral agent (monotherapy). He is compliant with treatment all of the time. His weight is stable. He is following a diabetic diet. There is no change in his home blood glucose trend. His breakfast blood glucose is taken between 5-6 am. His breakfast blood glucose range is generally 90-110 mg/dl. He does not see a podiatrist.Eye exam is current.  Erectile dysfunction secondary to diabetes viagra works well when he takes GERD Omeprazole works well to keep symptoms under control   Review of Systems  Constitutional: Negative.   HENT: Negative.   Respiratory: Negative for shortness of breath.   Cardiovascular: Negative for chest pain and palpitations.  Genitourinary: Negative.   Musculoskeletal: Negative for neck pain.  Neurological: Negative for headaches.  Psychiatric/Behavioral: Negative.   All other systems reviewed and are negative.      Objective:  Physical Exam  Constitutional: He is oriented to person, place, and time. He appears well-developed and well-nourished.  HENT:  Head: Normocephalic.  Eyes: Conjunctivae are normal. Pupils are equal, round, and reactive to light.   Neck: Normal range of motion.  Cardiovascular: Normal rate and regular rhythm.   Pulmonary/Chest: Effort normal and breath sounds normal.  Abdominal: Soft.  Musculoskeletal: Normal range of motion.  Neurological: He is alert and oriented to person, place, and time. He has normal reflexes.  Skin: Skin is warm.  Psychiatric: He has a normal mood and affect. His behavior is normal. Thought content normal.   BP 131/79 mmHg  Pulse 69  Temp(Src) 97.2 F (36.2 C) (Oral)  Ht 6' 1" (1.854 m)  Wt 237 lb (107.502 kg)  BMI 31.27 kg/m2  HGBA!C 6.6%     Assessment & Plan:   1. Hyperlipidemia Low fat diet - Lipid panel  2. Essential hypertension Do not add salt to diet - CMP14+EGFR  3. Type 2 diabetes mellitus without complication, without long-term current use of insulin (HCC) Continue carb counting - Bayer DCA Hb A1c Waived - metFORMIN (GLUCOPHAGE) 500 MG tablet; Take 1 tablet (500 mg total) by mouth 2 (two) times daily with a meal.  Dispense: 60 tablet; Refill: 5  4. Gastroesophageal reflux disease without esophagitis Avoid spicy foods Do not eat 2 hours prior to bedtime - omeprazole (PRILOSEC) 40 MG capsule; TAKE ONE CAPSULE BY MOUTH ONCE DAILY BEFORE BREAKFAST  Dispense: 30 capsule; Refill: 2  5. Need for hepatitis C screening test - Hepatitis C antibody    Labs pending Health maintenance reviewed Diet and exercise encouraged Continue all meds Follow up  In 3 months   [place], FNP

## 2015-10-30 NOTE — Patient Instructions (Signed)

## 2015-10-31 LAB — CMP14+EGFR
A/G RATIO: 1.9 (ref 1.2–2.2)
ALK PHOS: 58 IU/L (ref 39–117)
ALT: 25 IU/L (ref 0–44)
AST: 21 IU/L (ref 0–40)
Albumin: 4.5 g/dL (ref 3.6–4.8)
BUN / CREAT RATIO: 13 (ref 10–22)
BUN: 13 mg/dL (ref 8–27)
Bilirubin Total: 1.7 mg/dL — ABNORMAL HIGH (ref 0.0–1.2)
CALCIUM: 9.4 mg/dL (ref 8.6–10.2)
CO2: 26 mmol/L (ref 18–29)
Chloride: 101 mmol/L (ref 96–106)
Creatinine, Ser: 1.02 mg/dL (ref 0.76–1.27)
GFR calc Af Amer: 88 mL/min/{1.73_m2} (ref >59)
GFR, EST NON AFRICAN AMERICAN: 76 mL/min/{1.73_m2} (ref >59)
GLOBULIN, TOTAL: 2.4 g/dL (ref 1.5–4.5)
Glucose: 143 mg/dL — ABNORMAL HIGH (ref 65–99)
POTASSIUM: 5 mmol/L (ref 3.5–5.2)
SODIUM: 145 mmol/L — AB (ref 134–144)
Total Protein: 6.9 g/dL (ref 6.0–8.5)

## 2015-10-31 LAB — LIPID PANEL
Chol/HDL Ratio: 2.9 ratio units (ref 0.0–5.0)
Cholesterol, Total: 116 mg/dL (ref 100–199)
HDL: 40 mg/dL (ref >39)
LDL Calculated: 52 mg/dL (ref 0–99)
Triglycerides: 120 mg/dL (ref 0–149)
VLDL Cholesterol Cal: 24 mg/dL (ref 5–40)

## 2015-10-31 LAB — HEPATITIS C ANTIBODY: HCV Ab: <0.1 s/co ratio (ref 0.0–0.9)

## 2015-11-05 DIAGNOSIS — L309 Dermatitis, unspecified: Secondary | ICD-10-CM | POA: Diagnosis not present

## 2015-12-18 ENCOUNTER — Telehealth: Payer: Self-pay | Admitting: Nurse Practitioner

## 2015-12-18 NOTE — Telephone Encounter (Signed)
Spoke with patient.

## 2016-01-14 ENCOUNTER — Other Ambulatory Visit: Payer: Self-pay | Admitting: Nurse Practitioner

## 2016-01-15 IMAGING — CR DG CHEST 2V
2 series · 2 of 2 positions shown · non-contrast
Comparison: CT scan of the chest July 18, 2010

CLINICAL DATA: Routine physical examination, former smoker, known
hypertension, diabetes, and hyperlipidemia.

EXAM:
CHEST  2 VIEW

[view not recorded (1 of 2)]
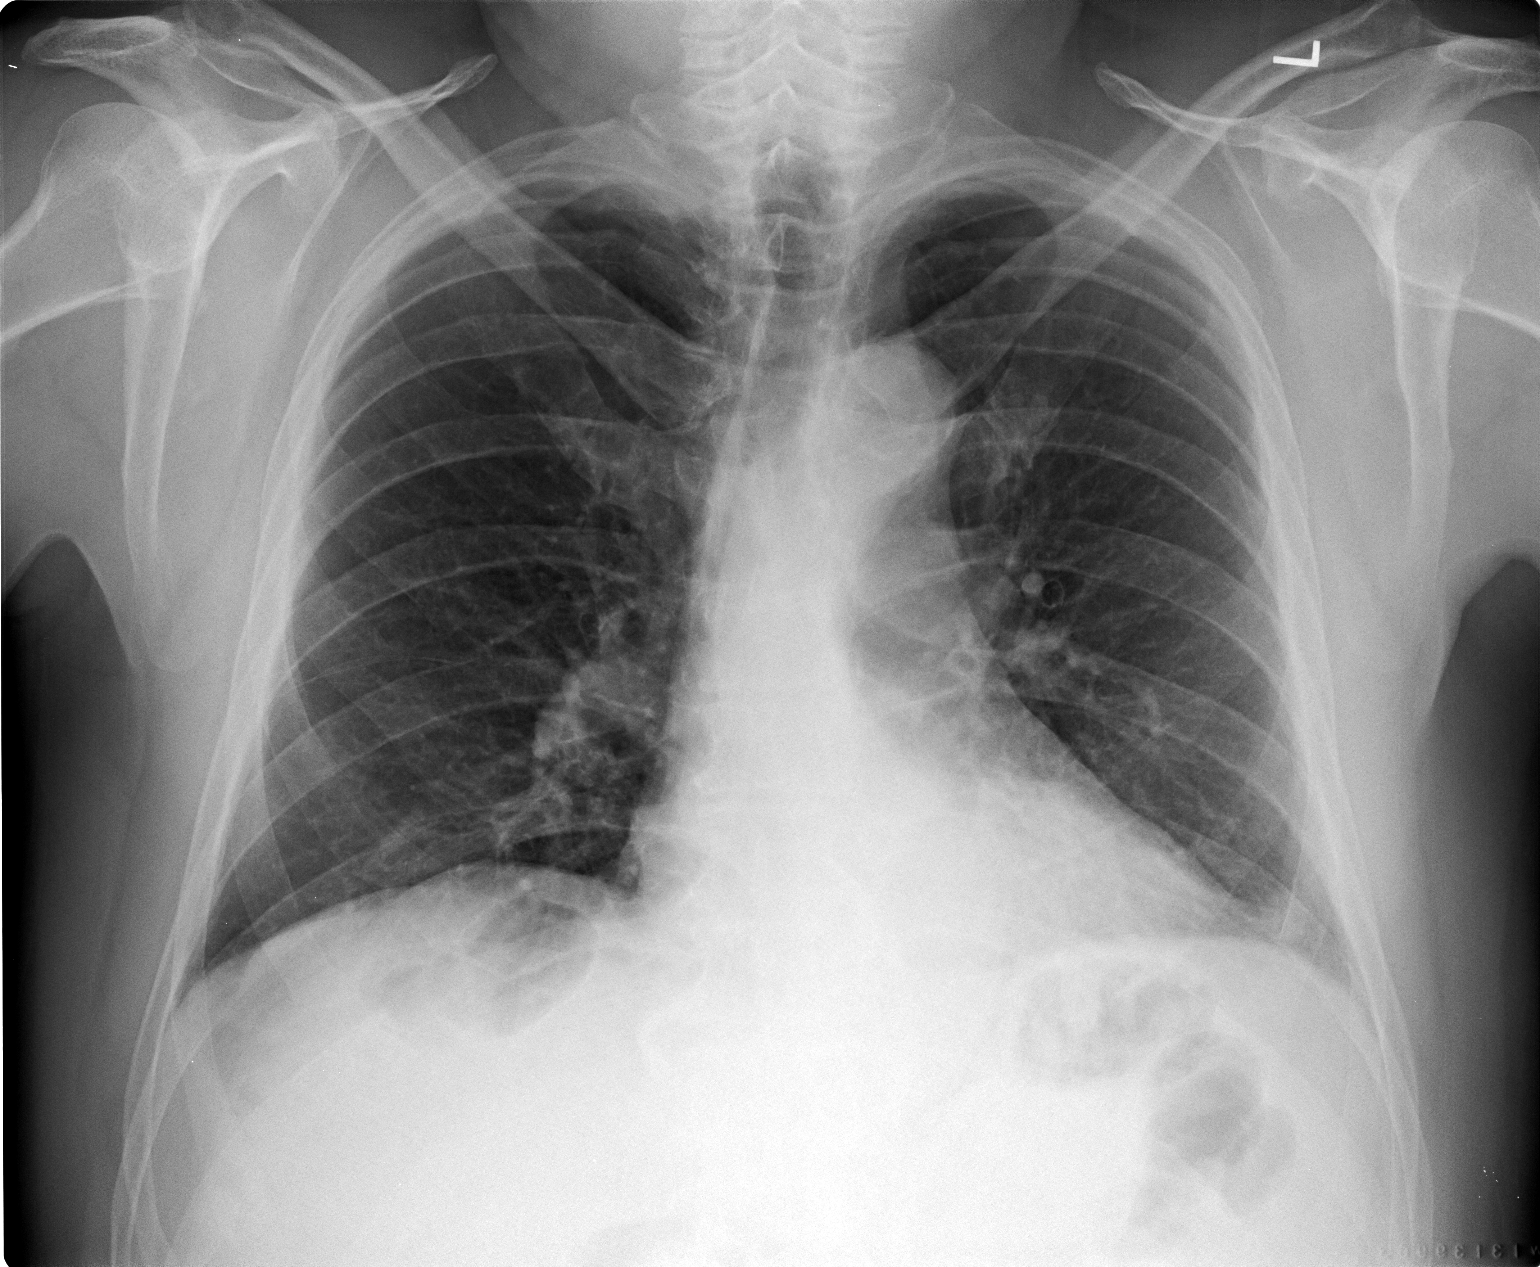

[view not recorded (2 of 2)]
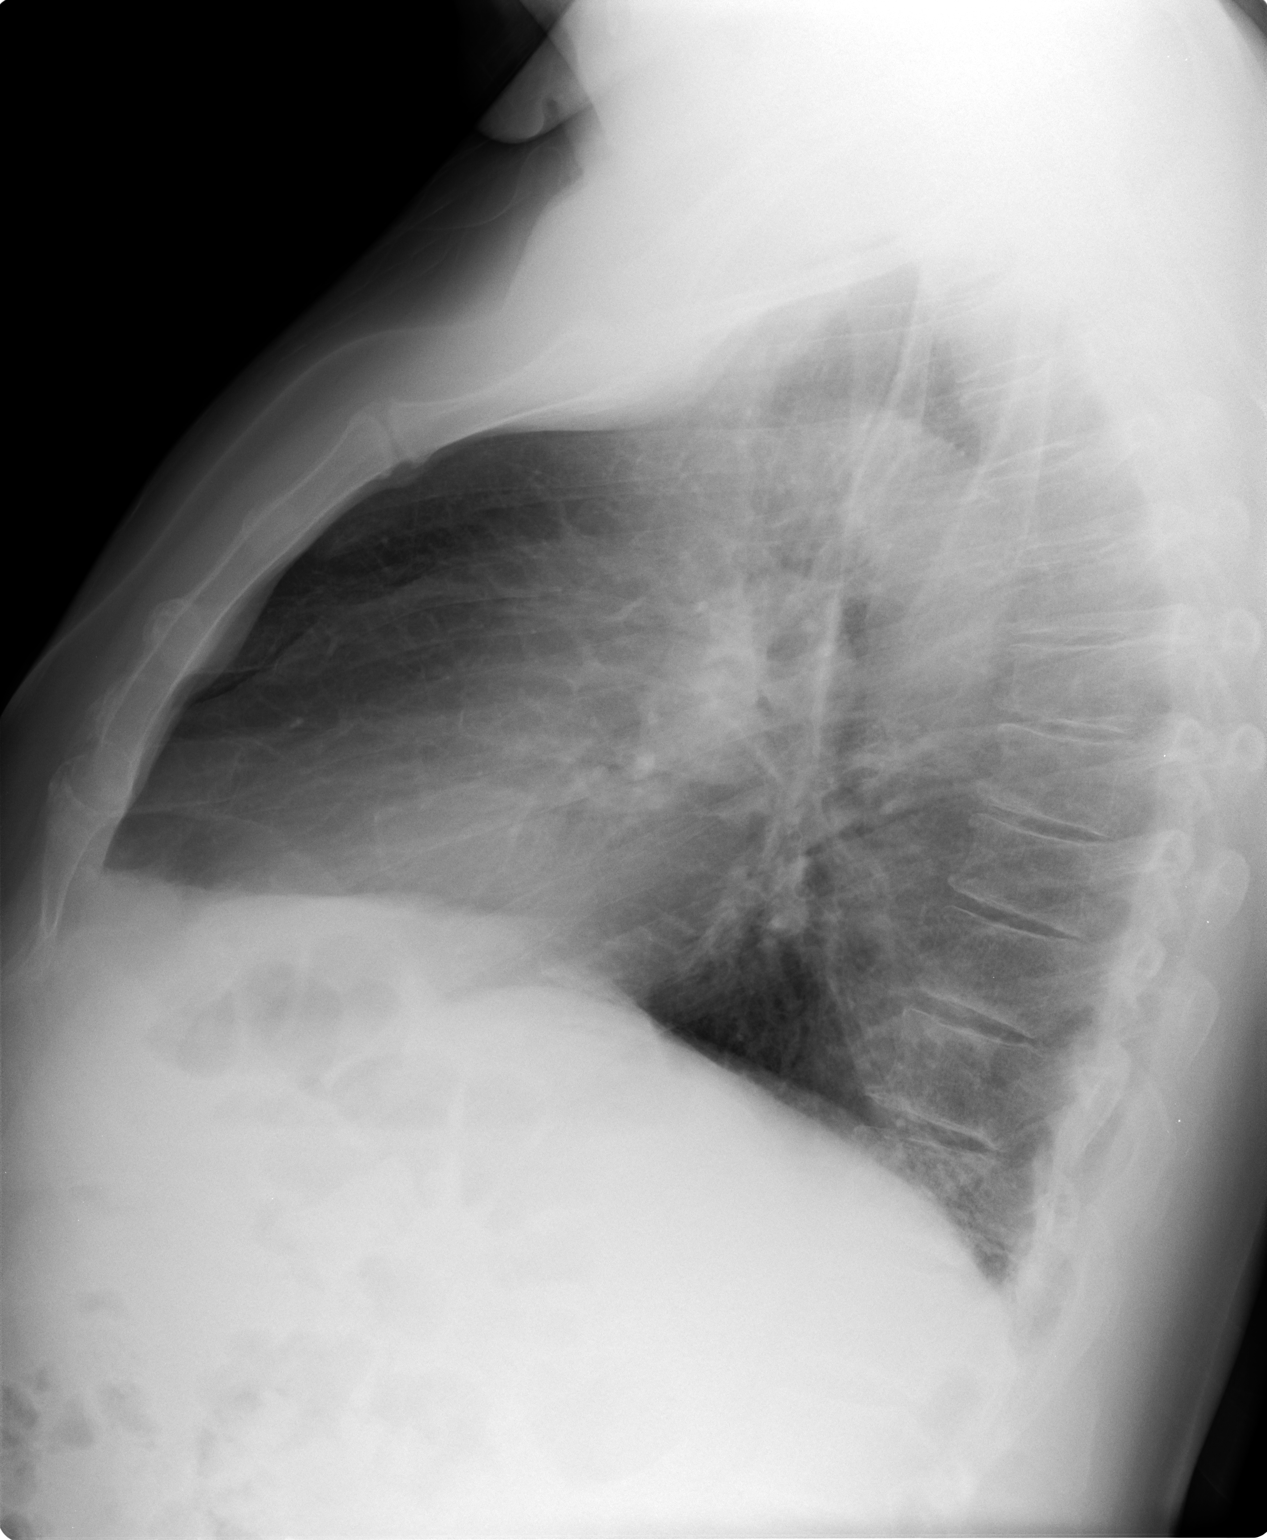

[2 of 2 positions shown; findings below may reference images not displayed]

FINDINGS: The lungs are adequately inflated. There is no focal infiltrate.
There is subtle density that projects just lateral to the cardiac
apex on the frontal view but not visualized on the lateral view. The
heart and pulmonary vascularity are normal. The mediastinum is
normal in width. There is hazy increased density in the retrocardiac
region overlying the lower thoracic spine which likely lies in the
left lower lobe. The bony thorax exhibits no acute abnormalities.
IMPRESSION: 1. Subtle nodularity lateral to the cardiac apex may reflect a
nipple shadow or may reflect an area of atelectasis -fibrosis in the
lingula mentioned on the previous CT scan. Repeat PA and lateral
chest radiographs with deep inspiration and using nipple markers
would be useful.
2. Patchy density likely in the left lower lobe posteriorly which
may reflect fibrosis or infiltrate.

## 2016-02-08 ENCOUNTER — Ambulatory Visit (INDEPENDENT_AMBULATORY_CARE_PROVIDER_SITE_OTHER): Payer: Commercial Managed Care - HMO | Admitting: Nurse Practitioner

## 2016-02-08 ENCOUNTER — Encounter: Payer: Self-pay | Admitting: Nurse Practitioner

## 2016-02-08 VITALS — BP 141/89 | HR 67 | Temp 97.1°F | Ht 73.0 in | Wt 239.8 lb

## 2016-02-08 DIAGNOSIS — Z6831 Body mass index (BMI) 31.0-31.9, adult: Secondary | ICD-10-CM | POA: Diagnosis not present

## 2016-02-08 DIAGNOSIS — E785 Hyperlipidemia, unspecified: Secondary | ICD-10-CM

## 2016-02-08 DIAGNOSIS — E119 Type 2 diabetes mellitus without complications: Secondary | ICD-10-CM | POA: Diagnosis not present

## 2016-02-08 DIAGNOSIS — N521 Erectile dysfunction due to diseases classified elsewhere: Secondary | ICD-10-CM

## 2016-02-08 DIAGNOSIS — I10 Essential (primary) hypertension: Secondary | ICD-10-CM | POA: Diagnosis not present

## 2016-02-08 DIAGNOSIS — E1169 Type 2 diabetes mellitus with other specified complication: Secondary | ICD-10-CM | POA: Diagnosis not present

## 2016-02-08 DIAGNOSIS — K219 Gastro-esophageal reflux disease without esophagitis: Secondary | ICD-10-CM

## 2016-02-08 LAB — BAYER DCA HB A1C WAIVED: HB A1C (BAYER DCA - WAIVED): 6.5 % (ref <7.0)

## 2016-02-08 MED ORDER — ROSUVASTATIN CALCIUM 10 MG PO TABS: ORAL_TABLET | ORAL | Status: DC

## 2016-02-08 MED ORDER — OMEPRAZOLE 40 MG PO CPDR: DELAYED_RELEASE_CAPSULE | ORAL | Status: DC

## 2016-02-08 MED ORDER — METFORMIN HCL 500 MG PO TABS: 500.0000 mg | ORAL_TABLET | Freq: Two times a day (BID) | ORAL | Status: DC

## 2016-02-08 MED ORDER — HYDROCHLOROTHIAZIDE 25 MG PO TABS: 25.0000 mg | ORAL_TABLET | Freq: Every day | ORAL | Status: DC

## 2016-02-08 MED ORDER — BENAZEPRIL HCL 40 MG PO TABS: 40.0000 mg | ORAL_TABLET | Freq: Every day | ORAL | Status: DC

## 2016-02-08 MED ORDER — AMLODIPINE BESYLATE 10 MG PO TABS: 10.0000 mg | ORAL_TABLET | Freq: Every day | ORAL | Status: DC

## 2016-02-08 NOTE — Progress Notes (Signed)
Subjective:    Patient ID: Jorge Garcia, male    DOB: 01-Feb-1946, 70 y.o.   MRN: [id number]    Patient here today for follow up of chronic medical problems.  Outpatient Encounter Prescriptions as of 02/08/2016  Medication Sig  . ACCU-CHEK AVIVA PLUS test strip USE ONE STRIP TO CHECK GLUCOSE 4 TIMES DAILY AS DIRECTED  . ACCU-CHEK SOFTCLIX LANCETS lancets USE ONE TO CHECK GLUCOSE 4 TIMES DAILY AS DIRECTED  . amLODipine (NORVASC) 10 MG tablet Take 1 tablet (10 mg total) by mouth daily.  Marland Kitchen aspirin EC 81 MG tablet Take 81 mg by mouth daily.  . benazepril (LOTENSIN) 40 MG tablet Take 1 tablet (40 mg total) by mouth daily.  . blood glucose meter kit and supplies KIT Use up to four times daily as directed. (FOR ICD-10 E11.9)  . cholecalciferol (VITAMIN D) 1000 UNITS tablet Take 1,000 Units by mouth daily.  . fish oil-omega-3 fatty acids 1000 MG capsule Take 1 g by mouth daily.    . hydrochlorothiazide (HYDRODIURIL) 25 MG tablet Take 1 tablet (25 mg total) by mouth daily.  . metFORMIN (GLUCOPHAGE) 500 MG tablet Take 1 tablet (500 mg total) by mouth 2 (two) times daily with a meal.  . mupirocin ointment (BACTROBAN) 2 % Apply BID to affected area  . omeprazole (PRILOSEC) 40 MG capsule TAKE ONE CAPSULE BY MOUTH ONCE DAILY BEFORE BREAKFAST  . rosuvastatin (CRESTOR) 10 MG tablet TAKE 1 TABLET (10 MG TOTAL) BY MOUTH DAILY.   No facility-administered encounter medications on file as of 02/08/2016.      Diabetes He presents for his initial diabetic visit. He has type 2 diabetes mellitus. Pertinent negatives for hypoglycemia include no headaches. Pertinent negatives for diabetes include no chest pain and no visual change. Symptoms are stable. Pertinent negatives for diabetic complications include no CVA or retinopathy. Risk factors for coronary artery disease include male sex, dyslipidemia and diabetes mellitus. Current diabetic treatment includes oral agent (monotherapy). He is compliant with treatment all  of the time. His weight is stable. He is following a diabetic diet. There is no change in his home blood glucose trend. His breakfast blood glucose is taken between 5-6 am. His breakfast blood glucose range is generally 90-110 mg/dl. He does not see a podiatrist.Eye exam is current.  Hyperlipidemia This is a chronic problem. The problem is controlled. Exacerbating diseases include diabetes. There are no known factors aggravating his hyperlipidemia. Pertinent negatives include no chest pain or shortness of breath. Current antihyperlipidemic treatment includes statins. The current treatment provides moderate improvement of lipids. Risk factors for coronary artery disease include dyslipidemia, diabetes mellitus, male sex and hypertension.  Hypertension This is a chronic problem. The current episode started more than 1 year ago. Pertinent negatives include no chest pain, headaches, neck pain, palpitations or shortness of breath. Risk factors for coronary artery disease include diabetes mellitus, dyslipidemia and male gender. Past treatments include calcium channel blockers. The current treatment provides moderate improvement. There is no history of CAD/MI, CVA or retinopathy.  Erectile dysfunction secondary to diabetes viagra works well when he takes GERD Omeprazole works well to keep symptoms under control   Review of Systems  Constitutional: Negative.   HENT: Negative.   Respiratory: Negative for shortness of breath.   Cardiovascular: Negative for chest pain and palpitations.  Genitourinary: Negative.   Musculoskeletal: Negative for neck pain.  Neurological: Negative for headaches.  Psychiatric/Behavioral: Negative.   All other systems reviewed and are negative.  Objective:   Physical Exam  Constitutional: He is oriented to person, place, and time. He appears well-developed and well-nourished.  HENT:  Head: Normocephalic.  Eyes: Conjunctivae are normal. Pupils are equal, round, and  reactive to light.  Neck: Normal range of motion.  Cardiovascular: Normal rate and regular rhythm.   Pulmonary/Chest: Effort normal and breath sounds normal.  Abdominal: Soft.  Musculoskeletal: Normal range of motion.  Neurological: He is alert and oriented to person, place, and time. He has normal reflexes.  Skin: Skin is warm.  Psychiatric: He has a normal mood and affect. His behavior is normal. Thought content normal.   BP 141/89 mmHg  Pulse 67  Temp(Src) 97.1 F (36.2 C) (Oral)  Ht _0  (1.854 m)  Wt 239 lb 12.8 oz (108.773 kg)  BMI 31.64 kg/m2  HGBA!C 6.5%     Assessment & Plan:   1. Type 2 diabetes mellitus without complication, without long-term current use of insulin (HCC) Continue to watch carbs in diet - Bayer DCA Hb A1c Waived - metFORMIN (GLUCOPHAGE) 500 MG tablet; Take 1 tablet (500 mg total) by mouth 2 (two) times daily with a meal.  Dispense: 180 tablet; Refill: 1  2. Hyperlipidemia Low fat diet - Lipid panel - rosuvastatin (CRESTOR) 10 MG tablet; TAKE 1 TABLET (10 MG TOTAL) BY MOUTH DAILY.  Dispense: 90 tablet; Refill: 1  3. Essential hypertension Do not add salt o diet - CMP14+EGFR - amLODipine (NORVASC) 10 MG tablet; Take 1 tablet (10 mg total) by mouth daily.  Dispense: 90 tablet; Refill: 1 - hydrochlorothiazide (HYDRODIURIL) 25 MG tablet; Take 1 tablet (25 mg total) by mouth daily.  Dispense: 90 tablet; Refill: 1 - benazepril (LOTENSIN) 40 MG tablet; Take 1 tablet (40 mg total) by mouth daily.  Dispense: 90 tablet; Refill: 1  4. Gastroesophageal reflux disease without esophagitis Avoid spicy foods Do not eat 2 hours prior to bedtime - omeprazole (PRILOSEC) 40 MG capsule; TAKE ONE CAPSULE BY MOUTH ONCE DAILY BEFORE BREAKFAST  Dispense: 90 capsule; Refill: 1  5. Erectile dysfunction associated with type 2 diabetes mellitus ([facility])  6. BMI 31.0-31.9,adult Discussed diet and exercise for person with BMI >25 Will recheck weight in 3-6  months     Labs pending Health maintenance reviewed Diet and exercise encouraged Continue all meds Follow up  In 3 month   [place], FNP

## 2016-02-09 LAB — CMP14+EGFR
ALBUMIN: 4.8 g/dL (ref 3.6–4.8)
ALK PHOS: 72 IU/L (ref 39–117)
ALT: 26 IU/L (ref 0–44)
AST: 27 IU/L (ref 0–40)
Albumin/Globulin Ratio: 1.9 (ref 1.2–2.2)
BUN / CREAT RATIO: 13 (ref 10–24)
BUN: 13 mg/dL (ref 8–27)
Bilirubin Total: 3.2 mg/dL — ABNORMAL HIGH (ref 0.0–1.2)
CO2: 26 mmol/L (ref 18–29)
Calcium: 9.8 mg/dL (ref 8.6–10.2)
Chloride: 101 mmol/L (ref 96–106)
Creatinine, Ser: 1.04 mg/dL (ref 0.76–1.27)
GFR calc non Af Amer: 74 mL/min/{1.73_m2} (ref >59)
GFR, EST AFRICAN AMERICAN: 85 mL/min/{1.73_m2} (ref >59)
GLUCOSE: 123 mg/dL — AB (ref 65–99)
Globulin, Total: 2.5 g/dL (ref 1.5–4.5)
Potassium: 4.2 mmol/L (ref 3.5–5.2)
Sodium: 145 mmol/L — ABNORMAL HIGH (ref 134–144)
Total Protein: 7.3 g/dL (ref 6.0–8.5)

## 2016-02-09 LAB — LIPID PANEL
CHOLESTEROL TOTAL: 111 mg/dL (ref 100–199)
Chol/HDL Ratio: 3.4 ratio units (ref 0.0–5.0)
HDL: 33 mg/dL — AB (ref >39)
LDL CALC: 40 mg/dL (ref 0–99)
Triglycerides: 191 mg/dL — ABNORMAL HIGH (ref 0–149)
VLDL CHOLESTEROL CAL: 38 mg/dL (ref 5–40)

## 2016-02-12 LAB — HEPATITIS PANEL, ACUTE
HEP B C IGM: NEGATIVE
Hep A IgM: NEGATIVE
Hep C Virus Ab: <0.1 s/co ratio (ref 0.0–0.9)
Hepatitis B Surface Ag: NEGATIVE

## 2016-02-12 LAB — SPECIMEN STATUS REPORT

## 2016-02-27 DIAGNOSIS — S62634A Displaced fracture of distal phalanx of right ring finger, initial encounter for closed fracture: Secondary | ICD-10-CM | POA: Diagnosis not present

## 2016-02-27 DIAGNOSIS — S6991XA Unspecified injury of right wrist, hand and finger(s), initial encounter: Secondary | ICD-10-CM | POA: Diagnosis not present

## 2016-02-28 ENCOUNTER — Telehealth: Payer: Self-pay | Admitting: Nurse Practitioner

## 2016-02-28 ENCOUNTER — Other Ambulatory Visit: Payer: Self-pay | Admitting: Nurse Practitioner

## 2016-02-28 DIAGNOSIS — S62634A Displaced fracture of distal phalanx of right ring finger, initial encounter for closed fracture: Secondary | ICD-10-CM

## 2016-03-03 DIAGNOSIS — M79644 Pain in right finger(s): Secondary | ICD-10-CM | POA: Diagnosis not present

## 2016-03-03 DIAGNOSIS — S62634A Displaced fracture of distal phalanx of right ring finger, initial encounter for closed fracture: Secondary | ICD-10-CM | POA: Diagnosis not present

## 2016-03-18 DIAGNOSIS — M79644 Pain in right finger(s): Secondary | ICD-10-CM | POA: Diagnosis not present

## 2016-03-18 DIAGNOSIS — S62634A Displaced fracture of distal phalanx of right ring finger, initial encounter for closed fracture: Secondary | ICD-10-CM | POA: Diagnosis not present

## 2016-03-25 DIAGNOSIS — S62634D Displaced fracture of distal phalanx of right ring finger, subsequent encounter for fracture with routine healing: Secondary | ICD-10-CM | POA: Diagnosis not present

## 2016-04-09 DIAGNOSIS — S62634D Displaced fracture of distal phalanx of right ring finger, subsequent encounter for fracture with routine healing: Secondary | ICD-10-CM | POA: Diagnosis not present

## 2016-04-30 DIAGNOSIS — M79644 Pain in right finger(s): Secondary | ICD-10-CM | POA: Diagnosis not present

## 2016-04-30 DIAGNOSIS — S62634D Displaced fracture of distal phalanx of right ring finger, subsequent encounter for fracture with routine healing: Secondary | ICD-10-CM | POA: Diagnosis not present

## 2016-05-12 LAB — HM DIABETES EYE EXAM

## 2016-05-16 ENCOUNTER — Ambulatory Visit (INDEPENDENT_AMBULATORY_CARE_PROVIDER_SITE_OTHER): Payer: Commercial Managed Care - HMO | Admitting: Nurse Practitioner

## 2016-05-16 ENCOUNTER — Encounter: Payer: Self-pay | Admitting: Nurse Practitioner

## 2016-05-16 VITALS — BP 130/79 | HR 68 | Temp 97.5°F | Ht 73.0 in | Wt 235.0 lb

## 2016-05-16 DIAGNOSIS — I10 Essential (primary) hypertension: Secondary | ICD-10-CM

## 2016-05-16 DIAGNOSIS — E782 Mixed hyperlipidemia: Secondary | ICD-10-CM

## 2016-05-16 DIAGNOSIS — E119 Type 2 diabetes mellitus without complications: Secondary | ICD-10-CM | POA: Diagnosis not present

## 2016-05-16 DIAGNOSIS — K219 Gastro-esophageal reflux disease without esophagitis: Secondary | ICD-10-CM | POA: Diagnosis not present

## 2016-05-16 DIAGNOSIS — Z23 Encounter for immunization: Secondary | ICD-10-CM | POA: Diagnosis not present

## 2016-05-16 DIAGNOSIS — E785 Hyperlipidemia, unspecified: Secondary | ICD-10-CM

## 2016-05-16 LAB — BAYER DCA HB A1C WAIVED: HB A1C (BAYER DCA - WAIVED): 6.8 % (ref <7.0)

## 2016-05-16 MED ORDER — OMEPRAZOLE 40 MG PO CPDR: DELAYED_RELEASE_CAPSULE | ORAL | 1 refills | Status: DC

## 2016-05-16 MED ORDER — METFORMIN HCL 500 MG PO TABS: 500.0000 mg | ORAL_TABLET | Freq: Two times a day (BID) | ORAL | 1 refills | Status: DC

## 2016-05-16 MED ORDER — BENAZEPRIL HCL 40 MG PO TABS: 40.0000 mg | ORAL_TABLET | Freq: Every day | ORAL | 1 refills | Status: DC

## 2016-05-16 MED ORDER — AMLODIPINE BESYLATE 10 MG PO TABS: 10.0000 mg | ORAL_TABLET | Freq: Every day | ORAL | 1 refills | Status: DC

## 2016-05-16 MED ORDER — HYDROCHLOROTHIAZIDE 25 MG PO TABS: 25.0000 mg | ORAL_TABLET | Freq: Every day | ORAL | 1 refills | Status: DC

## 2016-05-16 MED ORDER — ROSUVASTATIN CALCIUM 10 MG PO TABS: ORAL_TABLET | ORAL | 1 refills | Status: DC

## 2016-05-16 NOTE — Patient Instructions (Signed)
Diabetes and Foot Care Diabetes may cause you to have problems because of poor blood supply (circulation) to your feet and legs. This may cause the skin on your feet to become thinner, break easier, and heal more slowly. Your skin may become dry, and the skin may peel and crack. You may also have nerve damage in your legs and feet causing decreased feeling in them. You may not notice minor injuries to your feet that could lead to infections or more serious problems. Taking care of your feet is one of the most important things you can do for yourself.  HOME CARE INSTRUCTIONS  Wear shoes at all times, even in the house. Do not go barefoot. Bare feet are easily injured.  Check your feet daily for blisters, cuts, and redness. If you cannot see the bottom of your feet, use a mirror or ask someone for help.  Wash your feet with warm water (do not use hot water) and mild soap. Then pat your feet and the areas between your toes until they are completely dry. Do not soak your feet as this can dry your skin.  Apply a moisturizing lotion or petroleum jelly (that does not contain alcohol and is unscented) to the skin on your feet and to dry, brittle toenails. Do not apply lotion between your toes.  Trim your toenails straight across. Do not dig under them or around the cuticle. File the edges of your nails with an emery board or nail file.  Do not cut corns or calluses or try to remove them with medicine.  Wear clean socks or stockings every day. Make sure they are not too tight. Do not wear knee-high stockings since they may decrease blood flow to your legs.  Wear shoes that fit properly and have enough cushioning. To break in new shoes, wear them for just a few hours a day. This prevents you from injuring your feet. Always look in your shoes before you put them on to be sure there are no objects inside.  Do not cross your legs. This may decrease the blood flow to your feet.  If you find a minor scrape,  cut, or break in the skin on your feet, keep it and the skin around it clean and dry. These areas may be cleansed with mild soap and water. Do not cleanse the area with peroxide, alcohol, or iodine.  When you remove an adhesive bandage, be sure not to damage the skin around it.  If you have a wound, look at it several times a day to make sure it is healing.  Do not use heating pads or hot water bottles. They may burn your skin. If you have lost feeling in your feet or legs, you may not know it is happening until it is too late.  Make sure your health care provider performs a complete foot exam at least annually or more often if you have foot problems. Report any cuts, sores, or bruises to your health care provider immediately. SEEK MEDICAL CARE IF:   You have an injury that is not healing.  You have cuts or breaks in the skin.  You have an ingrown nail.  You notice redness on your legs or feet.  You feel burning or tingling in your legs or feet.  You have pain or cramps in your legs and feet.  Your legs or feet are numb.  Your feet always feel cold. SEEK IMMEDIATE MEDICAL CARE IF:   There is increasing redness,   swelling, or pain in or around a wound.  There is a red line that goes up your leg.  Pus is coming from a wound.  You develop a fever or as directed by your health care provider.  You notice a bad smell coming from an ulcer or wound.   This information is not intended to replace advice given to you by your health care provider. Make sure you discuss any questions you have with your health care provider.   Document Released: 07/25/2000 Document Revised: 03/30/2013 Document Reviewed: 01/04/2013 Elsevier Interactive Patient Education 2016 [employer]  

## 2016-05-16 NOTE — Progress Notes (Signed)
Subjective:    Patient ID: Jorge Garcia, male    DOB: [birth year], 70 y.o.   MRN: [id number]    Patient here today for follow up of chronic medical problems. Denies any changes since last visit. No complaints today.  Outpatient Encounter Prescriptions as of 05/16/2016  Medication Sig  . ACCU-CHEK AVIVA PLUS test strip USE ONE STRIP TO CHECK GLUCOSE 4 TIMES DAILY AS DIRECTED  . ACCU-CHEK SOFTCLIX LANCETS lancets USE ONE TO CHECK GLUCOSE 4 TIMES DAILY AS DIRECTED  . amLODipine (NORVASC) 10 MG tablet Take 1 tablet (10 mg total) by mouth daily.  Marland Kitchen aspirin EC 81 MG tablet Take 81 mg by mouth daily.  . benazepril (LOTENSIN) 40 MG tablet Take 1 tablet (40 mg total) by mouth daily.  . blood glucose meter kit and supplies KIT Use up to four times daily as directed. (FOR ICD-10 E11.9)  . cholecalciferol (VITAMIN D) 1000 UNITS tablet Take 1,000 Units by mouth daily.  . fish oil-omega-3 fatty acids 1000 MG capsule Take 1 g by mouth daily.    . hydrochlorothiazide (HYDRODIURIL) 25 MG tablet Take 1 tablet (25 mg total) by mouth daily.  . metFORMIN (GLUCOPHAGE) 500 MG tablet Take 1 tablet (500 mg total) by mouth 2 (two) times daily with a meal.  . mupirocin ointment (BACTROBAN) 2 % Apply BID to affected area  . omeprazole (PRILOSEC) 40 MG capsule TAKE ONE CAPSULE BY MOUTH ONCE DAILY BEFORE BREAKFAST  . rosuvastatin (CRESTOR) 10 MG tablet TAKE 1 TABLET (10 MG TOTAL) BY MOUTH DAILY.   No facility-administered encounter medications on file as of 05/16/2016.       Diabetes  He presents for his initial diabetic visit. He has type 2 diabetes mellitus. Pertinent negatives for hypoglycemia include no headaches. Pertinent negatives for diabetes include no chest pain and no visual change. Symptoms are stable. Pertinent negatives for diabetic complications include no CVA or retinopathy. Risk factors for coronary artery disease include male sex, dyslipidemia and diabetes mellitus. Current diabetic treatment includes  oral agent (monotherapy). He is compliant with treatment all of the time. His weight is stable. He is following a diabetic diet. There is no change in his home blood glucose trend. His breakfast blood glucose is taken between 5-6 am. His breakfast blood glucose range is generally 90-110 mg/dl. He does not see a podiatrist.Eye exam is current.  Hyperlipidemia  This is a chronic problem. The problem is controlled. Exacerbating diseases include diabetes. There are no known factors aggravating his hyperlipidemia. Pertinent negatives include no chest pain or shortness of breath. Current antihyperlipidemic treatment includes statins. The current treatment provides moderate improvement of lipids. Risk factors for coronary artery disease include dyslipidemia, diabetes mellitus, male sex and hypertension.  Hypertension  This is a chronic problem. The current episode started more than 1 year ago. Pertinent negatives include no chest pain, headaches, neck pain, palpitations or shortness of breath. Risk factors for coronary artery disease include diabetes mellitus, dyslipidemia and male gender. Past treatments include calcium channel blockers. The current treatment provides moderate improvement. There is no history of CAD/MI, CVA or retinopathy.  Erectile dysfunction secondary to diabetes viagra works well when he takes GERD Omeprazole works well to keep symptoms under control   Review of Systems  Constitutional: Negative.   HENT: Negative.   Respiratory: Negative for shortness of breath.   Cardiovascular: Negative for chest pain and palpitations.  Genitourinary: Negative.   Musculoskeletal: Negative for neck pain.  Neurological: Negative for headaches.  Psychiatric/Behavioral: Negative.  All other systems reviewed and are negative.      Objective:   Physical Exam  Constitutional: He is oriented to person, place, and time. He appears well-developed and well-nourished.  HENT:  Head: Normocephalic.   Eyes: Conjunctivae are normal. Pupils are equal, round, and reactive to light.  Neck: Normal range of motion.  Cardiovascular: Normal rate and regular rhythm.   Pulmonary/Chest: Effort normal and breath sounds normal.  Abdominal: Soft.  Musculoskeletal: Normal range of motion.  Neurological: He is alert and oriented to person, place, and time. He has normal reflexes.  Skin: Skin is warm.  Psychiatric: He has a normal mood and affect. His behavior is normal. Thought content normal.   BP 130/79   Pulse 68   Temp 97.5 F (36.4 C) (Oral)   Ht _0  (1.854 m)   Wt 235 lb (106.6 kg)   BMI 31.00 kg/m   HGBA!C 6.8% up from 6.5% at last visit     Assessment & Plan:   1. Type 2 diabetes mellitus without complication, without long-term current use of insulin (HCC) Continue to watch carbs in diet - Bayer DCA Hb A1c Waived - metFORMIN (GLUCOPHAGE) 500 MG tablet; Take 1 tablet (500 mg total) by mouth 2 (two) times daily with a meal.  Dispense: 180 tablet; Refill: 1  2. Hyperlipidemia, unspecified hyperlipidemia type Low fat diet - Lipid panel- rosuvastatin (CRESTOR) 10 MG tablet; TAKE 1 TABLET (10 MG TOTAL) BY MOUTH DAILY.  Dispense: 90 tablet; Refill: 1  3. Essential hypertension Do not add salt to diet - CMP14+EGFR - benazepril (LOTENSIN) 40 MG tablet; Take 1 tablet (40 mg total) by mouth daily.  Dispense: 90 tablet; Refill: 1 - hydrochlorothiazide (HYDRODIURIL) 25 MG tablet; Take 1 tablet (25 mg total) by mouth daily.  Dispense: 90 tablet; Refill: 1 - amLODipine (NORVASC) 10 MG tablet; Take 1 tablet (10 mg total) by mouth daily.  Dispense: 90 tablet; Refill: 1  4. Gastroesophageal reflux disease without esophagitis Avoid spicy foods Do not eat 2 hours prior to bedtime - omeprazole (PRILOSEC) 40 MG capsule; TAKE ONE CAPSULE BY MOUTH ONCE DAILY BEFORE BREAKFAST  Dispense: 90 capsule; Refill: 1    Labs pending Health maintenance reviewed Diet and exercise encouraged Continue all  meds Follow up  In 3 month   [place], FNP

## 2016-05-17 LAB — CMP14+EGFR
A/G RATIO: 2 (ref 1.2–2.2)
ALBUMIN: 4.8 g/dL (ref 3.6–4.8)
ALK PHOS: 65 IU/L (ref 39–117)
ALT: 24 IU/L (ref 0–44)
AST: 20 IU/L (ref 0–40)
BUN / CREAT RATIO: 13 (ref 10–24)
BUN: 14 mg/dL (ref 8–27)
Bilirubin Total: 1.9 mg/dL — ABNORMAL HIGH (ref 0.0–1.2)
CO2: 27 mmol/L (ref 18–29)
CREATININE: 1.06 mg/dL (ref 0.76–1.27)
Calcium: 10 mg/dL (ref 8.6–10.2)
Chloride: 99 mmol/L (ref 96–106)
GFR calc Af Amer: 83 mL/min/{1.73_m2} (ref >59)
GFR, EST NON AFRICAN AMERICAN: 72 mL/min/{1.73_m2} (ref >59)
GLOBULIN, TOTAL: 2.4 g/dL (ref 1.5–4.5)
Glucose: 131 mg/dL — ABNORMAL HIGH (ref 65–99)
POTASSIUM: 4.9 mmol/L (ref 3.5–5.2)
SODIUM: 141 mmol/L (ref 134–144)
Total Protein: 7.2 g/dL (ref 6.0–8.5)

## 2016-05-17 LAB — LIPID PANEL
CHOLESTEROL TOTAL: 123 mg/dL (ref 100–199)
Chol/HDL Ratio: 3.3 ratio units (ref 0.0–5.0)
HDL: 37 mg/dL — AB (ref >39)
LDL Calculated: 58 mg/dL (ref 0–99)
Triglycerides: 142 mg/dL (ref 0–149)
VLDL CHOLESTEROL CAL: 28 mg/dL (ref 5–40)

## 2016-05-19 DIAGNOSIS — E119 Type 2 diabetes mellitus without complications: Secondary | ICD-10-CM | POA: Diagnosis not present

## 2016-05-19 DIAGNOSIS — Z23 Encounter for immunization: Secondary | ICD-10-CM | POA: Diagnosis not present

## 2016-05-19 DIAGNOSIS — K219 Gastro-esophageal reflux disease without esophagitis: Secondary | ICD-10-CM | POA: Diagnosis not present

## 2016-05-19 DIAGNOSIS — E782 Mixed hyperlipidemia: Secondary | ICD-10-CM | POA: Diagnosis not present

## 2016-05-19 DIAGNOSIS — E785 Hyperlipidemia, unspecified: Secondary | ICD-10-CM | POA: Diagnosis not present

## 2016-05-19 DIAGNOSIS — I10 Essential (primary) hypertension: Secondary | ICD-10-CM | POA: Diagnosis not present

## 2016-07-15 ENCOUNTER — Other Ambulatory Visit: Payer: Self-pay | Admitting: Nurse Practitioner

## 2016-08-19 ENCOUNTER — Encounter: Payer: Self-pay | Admitting: Nurse Practitioner

## 2016-08-19 ENCOUNTER — Ambulatory Visit (INDEPENDENT_AMBULATORY_CARE_PROVIDER_SITE_OTHER): Payer: Medicare HMO | Admitting: Nurse Practitioner

## 2016-08-19 VITALS — BP 139/85 | HR 72 | Temp 97.3°F | Ht 73.0 in | Wt 237.0 lb

## 2016-08-19 DIAGNOSIS — Z1211 Encounter for screening for malignant neoplasm of colon: Secondary | ICD-10-CM

## 2016-08-19 DIAGNOSIS — E1169 Type 2 diabetes mellitus with other specified complication: Secondary | ICD-10-CM

## 2016-08-19 DIAGNOSIS — E785 Hyperlipidemia, unspecified: Secondary | ICD-10-CM | POA: Diagnosis not present

## 2016-08-19 DIAGNOSIS — E119 Type 2 diabetes mellitus without complications: Secondary | ICD-10-CM | POA: Diagnosis not present

## 2016-08-19 DIAGNOSIS — K219 Gastro-esophageal reflux disease without esophagitis: Secondary | ICD-10-CM

## 2016-08-19 DIAGNOSIS — Z6831 Body mass index (BMI) 31.0-31.9, adult: Secondary | ICD-10-CM

## 2016-08-19 DIAGNOSIS — I10 Essential (primary) hypertension: Secondary | ICD-10-CM | POA: Diagnosis not present

## 2016-08-19 DIAGNOSIS — Z1212 Encounter for screening for malignant neoplasm of rectum: Secondary | ICD-10-CM | POA: Diagnosis not present

## 2016-08-19 DIAGNOSIS — N521 Erectile dysfunction due to diseases classified elsewhere: Secondary | ICD-10-CM | POA: Diagnosis not present

## 2016-08-19 DIAGNOSIS — Z125 Encounter for screening for malignant neoplasm of prostate: Secondary | ICD-10-CM | POA: Diagnosis not present

## 2016-08-19 LAB — BAYER DCA HB A1C WAIVED: HB A1C (BAYER DCA - WAIVED): 6.7 % (ref <7.0)

## 2016-08-19 NOTE — Patient Instructions (Signed)
DASH Eating Plan DASH stands for "Dietary Approaches to Stop Hypertension." The DASH eating plan is a healthy eating plan that has been shown to reduce high blood pressure (hypertension). Additional health benefits may include reducing the risk of type 2 diabetes mellitus, heart disease, and stroke. The DASH eating plan may also help with weight loss. What do I need to know about the DASH eating plan? For the DASH eating plan, you will follow these general guidelines:  Choose foods with less than 150 milligrams of sodium per serving (as listed on the food label).  Use salt-free seasonings or herbs instead of table salt or sea salt.  Check with your health care provider or pharmacist before using salt substitutes.  Eat lower-sodium products. These are often labeled as "low-sodium" or "no salt added."  Eat fresh foods. Avoid eating a lot of canned foods.  Eat more vegetables, fruits, and low-fat dairy products.  Choose whole grains. Look for the word "whole" as the first word in the ingredient list.  Choose fish and skinless chicken or turkey more often than red meat. Limit fish, poultry, and meat to 6 oz (170 g) each day.  Limit sweets, desserts, sugars, and sugary drinks.  Choose heart-healthy fats.  Eat more home-cooked food and less restaurant, buffet, and fast food.  Limit fried foods.  Do not fry foods. Cook foods using methods such as baking, boiling, grilling, and broiling instead.  When eating at a restaurant, ask that your food be prepared with less salt, or no salt if possible. What foods can I eat? Seek help from a dietitian for individual calorie needs. Grains  Whole grain or whole wheat bread. Brown rice. Whole grain or whole wheat pasta. Quinoa, bulgur, and whole grain cereals. Low-sodium cereals. Corn or whole wheat flour tortillas. Whole grain cornbread. Whole grain crackers. Low-sodium crackers. Vegetables  Fresh or frozen vegetables (raw, steamed, roasted, or  grilled). Low-sodium or reduced-sodium tomato and vegetable juices. Low-sodium or reduced-sodium tomato sauce and paste. Low-sodium or reduced-sodium canned vegetables. Fruits  All fresh, canned (in natural juice), or frozen fruits. Meat and Other Protein Products  Ground beef (85% or leaner), grass-fed beef, or beef trimmed of fat. Skinless chicken or turkey. Ground chicken or turkey. Pork trimmed of fat. All fish and seafood. Eggs. Dried beans, peas, or lentils. Unsalted nuts and seeds. Unsalted canned beans. Dairy  Low-fat dairy products, such as skim or 1% milk, 2% or reduced-fat cheeses, low-fat ricotta or cottage cheese, or plain low-fat yogurt. Low-sodium or reduced-sodium cheeses. Fats and Oils  Tub margarines without trans fats. Light or reduced-fat mayonnaise and salad dressings (reduced sodium). Avocado. Safflower, olive, or canola oils. Natural peanut or almond butter. Other  Unsalted popcorn and pretzels. The items listed above may not be a complete list of recommended foods or beverages. Contact your dietitian for more options.  What foods are not recommended? Grains  White bread. White pasta. White rice. Refined cornbread. Bagels and croissants. Crackers that contain trans fat. Vegetables  Creamed or fried vegetables. Vegetables in a cheese sauce. Regular canned vegetables. Regular canned tomato sauce and paste. Regular tomato and vegetable juices. Fruits  Canned fruit in light or heavy syrup. Fruit juice. Meat and Other Protein Products  Fatty cuts of meat. Ribs, chicken wings, bacon, sausage, bologna, salami, chitterlings, fatback, hot dogs, bratwurst, and packaged luncheon meats. Salted nuts and seeds. Canned beans with salt. Dairy  Whole or 2% milk, cream, half-and-half, and cream cheese. Whole-fat or sweetened yogurt. Full-fat cheeses   or blue cheese. Nondairy creamers and whipped toppings. Processed cheese, cheese spreads, or cheese curds. Condiments  Onion and garlic  salt, seasoned salt, table salt, and sea salt. Canned and packaged gravies. Worcestershire sauce. Tartar sauce. Barbecue sauce. Teriyaki sauce. Soy sauce, including reduced sodium. Steak sauce. Fish sauce. Oyster sauce. Cocktail sauce. Horseradish. Ketchup and mustard. Meat flavorings and tenderizers. Bouillon cubes. Hot sauce. Tabasco sauce. Marinades. Taco seasonings. Relishes. Fats and Oils  Butter, stick margarine, lard, shortening, ghee, and bacon fat. Coconut, palm kernel, or palm oils. Regular salad dressings. Other  Pickles and olives. Salted popcorn and pretzels. The items listed above may not be a complete list of foods and beverages to avoid. Contact your dietitian for more information.  Where can I find more information? National Heart, Lung, and Blood Institute: www.nhlbi.nih.gov/health/health-topics/topics/dash/ This information is not intended to replace advice given to you by your health care provider. Make sure you discuss any questions you have with your health care provider. Document Released: 07/17/2011 Document Revised: 01/03/2016 Document Reviewed: 06/01/2013 Elsevier Interactive Patient Education  2017 Elsevier Inc.  

## 2016-08-19 NOTE — Progress Notes (Signed)
Subjective:    Patient ID: Jorge Garcia, male    DOB: May 03, 1946, 71 y.o.   MRN: [id number]    Patient here today for follow up of chronic medical problems. Denies any changes since last visit. No complaints today.  Outpatient Encounter Prescriptions as of 08/19/2016  Medication Sig  . ACCU-CHEK AVIVA PLUS test strip USE ONE STRIP TO CHECK GLUCOSE 4 TIMES DAILY  . ACCU-CHEK SOFTCLIX LANCETS lancets USE TO CHECK GLUCOSE 4 TIMES DAILY  . amLODipine (NORVASC) 10 MG tablet Take 1 tablet (10 mg total) by mouth daily.  Marland Kitchen aspirin EC 81 MG tablet Take 81 mg by mouth daily.  . benazepril (LOTENSIN) 40 MG tablet Take 1 tablet (40 mg total) by mouth daily.  . blood glucose meter kit and supplies KIT Use up to four times daily as directed. (FOR ICD-10 E11.9)  . cholecalciferol (VITAMIN D) 1000 UNITS tablet Take 1,000 Units by mouth daily.  . fish oil-omega-3 fatty acids 1000 MG capsule Take 1 g by mouth daily.    . hydrochlorothiazide (HYDRODIURIL) 25 MG tablet Take 1 tablet (25 mg total) by mouth daily.  . metFORMIN (GLUCOPHAGE) 500 MG tablet Take 1 tablet (500 mg total) by mouth 2 (two) times daily with a meal.  . mupirocin ointment (BACTROBAN) 2 % Apply BID to affected area  . omeprazole (PRILOSEC) 40 MG capsule TAKE ONE CAPSULE BY MOUTH ONCE DAILY BEFORE BREAKFAST  . rosuvastatin (CRESTOR) 10 MG tablet TAKE 1 TABLET (10 MG TOTAL) BY MOUTH DAILY.   No facility-administered encounter medications on file as of 08/19/2016.       Diabetes  He presents for his initial diabetic visit. He has type 2 diabetes mellitus. Pertinent negatives for hypoglycemia include no headaches. Pertinent negatives for diabetes include no chest pain and no visual change. Symptoms are stable. Pertinent negatives for diabetic complications include no CVA or retinopathy. Risk factors for coronary artery disease include male sex, dyslipidemia and diabetes mellitus. Current diabetic treatment includes oral agent (monotherapy). He  is compliant with treatment all of the time. His weight is stable. He is following a diabetic diet. There is no change in his home blood glucose trend. His breakfast blood glucose is taken between 5-6 am. His breakfast blood glucose range is generally 130-140 mg/dl. His highest blood glucose is 180-200 mg/dl. His overall blood glucose range is 130-140 mg/dl. He does not see a podiatrist.Eye exam is current.  Hyperlipidemia  This is a chronic problem. The problem is controlled. Exacerbating diseases include diabetes. There are no known factors aggravating his hyperlipidemia. Pertinent negatives include no chest pain or shortness of breath. Current antihyperlipidemic treatment includes statins. The current treatment provides moderate improvement of lipids. Risk factors for coronary artery disease include dyslipidemia, diabetes mellitus, male sex and hypertension.  Hypertension  This is a chronic problem. The current episode started more than 1 year ago. Pertinent negatives include no chest pain, headaches, neck pain, palpitations or shortness of breath. Risk factors for coronary artery disease include diabetes mellitus, dyslipidemia and male gender. Past treatments include calcium channel blockers. The current treatment provides moderate improvement. There is no history of CAD/MI, CVA or retinopathy.  Erectile dysfunction secondary to diabetes viagra works well when he takes GERD Omeprazole works well to keep symptoms under control   Review of Systems  Constitutional: Negative.   HENT: Negative.   Respiratory: Negative for shortness of breath.   Cardiovascular: Negative for chest pain and palpitations.  Genitourinary: Negative.   Musculoskeletal: Negative for neck  pain.  Neurological: Negative for headaches.  Psychiatric/Behavioral: Negative.   All other systems reviewed and are negative.      Objective:   Physical Exam  Constitutional: He is oriented to person, place, and time. He appears  well-developed and well-nourished.  HENT:  Head: Normocephalic.  Eyes: Conjunctivae are normal. Pupils are equal, round, and reactive to light.  Neck: Normal range of motion.  Cardiovascular: Normal rate and regular rhythm.   Pulmonary/Chest: Effort normal and breath sounds normal.  Abdominal: Soft.  Musculoskeletal: Normal range of motion.  Neurological: He is alert and oriented to person, place, and time. He has normal reflexes.  Skin: Skin is warm.  Psychiatric: He has a normal mood and affect. His behavior is normal. Thought content normal.   BP 139/85   Pulse 72   Temp 97.3 F (36.3 C) (Oral)   Ht '6\' 1"'  (1.854 m)   Wt 237 lb (107.5 kg)   BMI 31.27 kg/m    HGBA1C- 6.7% down from 6.8%     Assessment & Plan:   1. Type 2 diabetes mellitus without complication, without long-term current use of insulin (HCC) Continue to watch carbs in diet - Bayer DCA Hb A1c Waived - Microalbumin / creatinine urine ratio  2. Hyperlipidemia, unspecified hyperlipidemia type Low fat diet - Lipid panel  3. Essential hypertension Low sodium diet - CMP14+EGFR  4. Encounter for colorectal cancer screening  - Fecal occult blood, imunochemical; Future  5. Prostate cancer screening - PSA, total and free  6. Gastroesophageal reflux disease without esophagitis Avoid spicy foods Do not eat 2 hours prior to bedtime   7. BMI 31.0-31.9,adult Discussed diet and exercise for person with BMI >25 Will recheck weight in 3-6 months  8. Erectile dysfunction associated with type 2 diabetes mellitus [facility]) Patient doesn't want anything right now    Labs pending Health maintenance reviewed Diet and exercise encouraged Continue all meds Follow up  In 3 months   [place], FNP

## 2016-08-20 ENCOUNTER — Other Ambulatory Visit: Payer: Medicare HMO

## 2016-08-20 DIAGNOSIS — Z1212 Encounter for screening for malignant neoplasm of rectum: Principal | ICD-10-CM

## 2016-08-20 DIAGNOSIS — Z1211 Encounter for screening for malignant neoplasm of colon: Secondary | ICD-10-CM

## 2016-08-20 LAB — CMP14+EGFR
ALBUMIN: 4.2 g/dL (ref 3.6–4.8)
ALK PHOS: 60 IU/L (ref 39–117)
ALT: 25 IU/L (ref 0–44)
AST: 21 IU/L (ref 0–40)
Albumin/Globulin Ratio: 1.6 (ref 1.2–2.2)
BILIRUBIN TOTAL: 2.3 mg/dL — AB (ref 0.0–1.2)
BUN / CREAT RATIO: 11 (ref 10–24)
BUN: 11 mg/dL (ref 8–27)
CHLORIDE: 101 mmol/L (ref 96–106)
CO2: 26 mmol/L (ref 18–29)
Calcium: 9.6 mg/dL (ref 8.6–10.2)
Creatinine, Ser: 1.04 mg/dL (ref 0.76–1.27)
GFR calc Af Amer: 85 mL/min/{1.73_m2} (ref >59)
GFR calc non Af Amer: 73 mL/min/{1.73_m2} (ref >59)
GLOBULIN, TOTAL: 2.6 g/dL (ref 1.5–4.5)
GLUCOSE: 125 mg/dL — AB (ref 65–99)
Potassium: 4.4 mmol/L (ref 3.5–5.2)
SODIUM: 142 mmol/L (ref 134–144)
Total Protein: 6.8 g/dL (ref 6.0–8.5)

## 2016-08-20 LAB — LIPID PANEL
CHOLESTEROL TOTAL: 117 mg/dL (ref 100–199)
Chol/HDL Ratio: 3.4 ratio units (ref 0.0–5.0)
HDL: 34 mg/dL — ABNORMAL LOW (ref >39)
LDL Calculated: 47 mg/dL (ref 0–99)
TRIGLYCERIDES: 178 mg/dL — AB (ref 0–149)
VLDL Cholesterol Cal: 36 mg/dL (ref 5–40)

## 2016-08-20 LAB — MICROALBUMIN / CREATININE URINE RATIO
Creatinine, Urine: 200.8 mg/dL
MICROALBUM., U, RANDOM: 13.6 ug/mL
Microalb/Creat Ratio: 6.8 mg/g creat (ref 0.0–30.0)

## 2016-08-20 LAB — PSA, TOTAL AND FREE
PSA FREE: 0.57 ng/mL
PSA, Free Pct: 40.7 %
Prostate Specific Ag, Serum: 1.4 ng/mL (ref 0.0–4.0)

## 2016-08-21 LAB — FECAL OCCULT BLOOD, IMMUNOCHEMICAL: FECAL OCCULT BLD: NEGATIVE

## 2016-12-03 ENCOUNTER — Encounter: Payer: Self-pay | Admitting: Nurse Practitioner

## 2016-12-03 ENCOUNTER — Ambulatory Visit (INDEPENDENT_AMBULATORY_CARE_PROVIDER_SITE_OTHER): Payer: Medicare HMO | Admitting: Nurse Practitioner

## 2016-12-03 VITALS — BP 121/72 | HR 80 | Temp 97.3°F | Ht 73.0 in | Wt 234.0 lb

## 2016-12-03 DIAGNOSIS — Z6831 Body mass index (BMI) 31.0-31.9, adult: Secondary | ICD-10-CM | POA: Diagnosis not present

## 2016-12-03 DIAGNOSIS — E782 Mixed hyperlipidemia: Secondary | ICD-10-CM

## 2016-12-03 DIAGNOSIS — E119 Type 2 diabetes mellitus without complications: Secondary | ICD-10-CM | POA: Diagnosis not present

## 2016-12-03 DIAGNOSIS — N521 Erectile dysfunction due to diseases classified elsewhere: Secondary | ICD-10-CM | POA: Diagnosis not present

## 2016-12-03 DIAGNOSIS — K219 Gastro-esophageal reflux disease without esophagitis: Secondary | ICD-10-CM

## 2016-12-03 DIAGNOSIS — E1169 Type 2 diabetes mellitus with other specified complication: Secondary | ICD-10-CM

## 2016-12-03 DIAGNOSIS — I10 Essential (primary) hypertension: Secondary | ICD-10-CM

## 2016-12-03 LAB — BAYER DCA HB A1C WAIVED: HB A1C: 7 % — AB (ref <7.0)

## 2016-12-03 MED ORDER — HYDROCHLOROTHIAZIDE 25 MG PO TABS: 25.0000 mg | ORAL_TABLET | Freq: Every day | ORAL | 1 refills | Status: DC

## 2016-12-03 MED ORDER — METFORMIN HCL 500 MG PO TABS: 500.0000 mg | ORAL_TABLET | Freq: Two times a day (BID) | ORAL | 1 refills | Status: DC

## 2016-12-03 MED ORDER — OMEPRAZOLE 40 MG PO CPDR: DELAYED_RELEASE_CAPSULE | ORAL | 1 refills | Status: DC

## 2016-12-03 MED ORDER — ROSUVASTATIN CALCIUM 10 MG PO TABS: ORAL_TABLET | ORAL | 1 refills | Status: DC

## 2016-12-03 MED ORDER — AMLODIPINE BESYLATE 10 MG PO TABS: 10.0000 mg | ORAL_TABLET | Freq: Every day | ORAL | 1 refills | Status: DC

## 2016-12-03 MED ORDER — BENAZEPRIL HCL 40 MG PO TABS: 40.0000 mg | ORAL_TABLET | Freq: Every day | ORAL | 1 refills | Status: DC

## 2016-12-03 NOTE — Patient Instructions (Signed)
Bone Health Bones protect organs, store calcium, and anchor muscles. Good health habits, such as eating nutritious foods and exercising regularly, are important for maintaining healthy bones. They can also help to prevent a condition that causes bones to lose density and become weak and brittle (osteoporosis). Why is bone mass important? Bone mass refers to the amount of bone tissue that you have. The higher your bone mass, the stronger your bones. An important step toward having healthy bones throughout life is to have strong and dense bones during childhood. A young adult who has a high bone mass is more likely to have a high bone mass later in life. Bone mass at its greatest it is called peak bone mass. A large decline in bone mass occurs in older adults. In women, it occurs about the time of menopause. During this time, it is important to practice good health habits, because if more bone is lost than what is replaced, the bones will become less healthy and more likely to break (fracture). If you find that you have a low bone mass, you may be able to prevent osteoporosis or further bone loss by changing your diet and lifestyle. How can I find out if my bone mass is low? Bone mass can be measured with an X-ray test that is called a bone mineral density (BMD) test. This test is recommended for all women who are age 65 or older. It may also be recommended for men who are age 70 or older, or for people who are more likely to develop osteoporosis due to:  Having bones that break easily.  Having a long-term disease that weakens bones, such as kidney disease or rheumatoid arthritis.  Having menopause earlier than normal.  Taking medicine that weakens bones, such as steroids, thyroid hormones, or hormone treatment for breast cancer or prostate cancer.  Smoking.  Drinking three or more alcoholic drinks each day. What are the nutritional recommendations for healthy bones? To have healthy bones, you need  to get enough of the right minerals and vitamins. Most nutrition experts recommend getting these nutrients from the foods that you eat. Nutritional recommendations vary from person to person. Ask your health care provider what is healthy for you. Here are some general guidelines. Calcium Recommendations  Calcium is the most important (essential) mineral for bone health. Most people can get enough calcium from their diet, but supplements may be recommended for people who are at risk for osteoporosis. Good sources of calcium include:  Dairy products, such as low-fat or nonfat milk, cheese, and yogurt.  Dark green leafy vegetables, such as bok choy and broccoli.  Calcium-fortified foods, such as orange juice, cereal, bread, soy beverages, and tofu products.  Nuts, such as almonds. Follow these recommended amounts for daily calcium intake:  Children, age 1?3: 700 mg.  Children, age 4?8: 1,000 mg.  Children, age 9?13: 1,300 mg.  Teens, age 14?18: 1,300 mg.  Adults, age 19?50: 1,000 mg.  Adults, age 51?70:  Men: 1,000 mg.  Women: 1,200 mg.  Adults, age 71 or older: 1,200 mg.  Pregnant and breastfeeding females:  Teens: 1,300 mg.  Adults: 1,000 mg. Vitamin D Recommendations  Vitamin D is the most essential vitamin for bone health. It helps the body to absorb calcium. Sunlight stimulates the skin to make vitamin D, so be sure to get enough sunlight. If you live in a cold climate or you do not get outside often, your health care provider may recommend that you take vitamin D   supplements. Good sources of vitamin D in your diet include:  Egg yolks.  Saltwater fish.  Milk and cereal fortified with vitamin D. Follow these recommended amounts for daily vitamin D intake:  Children and teens, age 1?18: 600 international units.  Adults, age 50 or younger: 400-800 international units.  Adults, age 51 or older: 800-1,000 international units. Other Nutrients  Other nutrients for bone  health include:  Phosphorus. This mineral is found in meat, poultry, dairy foods, nuts, and legumes. The recommended daily intake for adult men and adult women is 700 mg.  Magnesium. This mineral is found in seeds, nuts, dark green vegetables, and legumes. The recommended daily intake for adult men is 400?420 mg. For adult women, it is 310?320 mg.  Vitamin K. This vitamin is found in green leafy vegetables. The recommended daily intake is 120 mg for adult men and 90 mg for adult women. What type of physical activity is best for building and maintaining healthy bones? Weight-bearing and strength-building activities are important for building and maintaining peak bone mass. Weight-bearing activities cause muscles and bones to work against gravity. Strength-building activities increases muscle strength that supports bones. Weight-bearing and muscle-building activities include:  Walking and hiking.  Jogging and running.  Dancing.  Gym exercises.  Lifting weights.  Tennis and racquetball.  Climbing stairs.  Aerobics. Adults should get at least 30 minutes of moderate physical activity on most days. Children should get at least 60 minutes of moderate physical activity on most days. Ask your health care provide what type of exercise is best for you. Where can I find more information? For more information, check out the following websites:  National Osteoporosis Foundation: http://nof.org/learn/basics  National Institutes of Health: http://www.niams.nih.gov/Health_Info/Bone/Bone_Health/bone_health_for_life.asp This information is not intended to replace advice given to you by your health care provider. Make sure you discuss any questions you have with your health care provider. Document Released: 10/18/2003 Document Revised: 02/15/2016 Document Reviewed: 08/02/2014 Elsevier Interactive Patient Education  2017 [employer]vier Inc.  

## 2016-12-03 NOTE — Progress Notes (Signed)
Subjective:    Patient ID: Jorge Garcia, male    DOB: [birth year], 71 y.o.   MRN: [id number]  HPI   Jorge Garcia is here today for follow up of chronic medical problem.  Outpatient Encounter Prescriptions as of 12/03/2016  Medication Sig  . ACCU-CHEK AVIVA PLUS test strip USE ONE STRIP TO CHECK GLUCOSE 4 TIMES DAILY  . ACCU-CHEK SOFTCLIX LANCETS lancets USE TO CHECK GLUCOSE 4 TIMES DAILY  . amLODipine (NORVASC) 10 MG tablet Take 1 tablet (10 mg total) by mouth daily.  Marland Kitchen aspirin EC 81 MG tablet Take 81 mg by mouth daily.  . benazepril (LOTENSIN) 40 MG tablet Take 1 tablet (40 mg total) by mouth daily.  . blood glucose meter kit and supplies KIT Use up to four times daily as directed. (FOR ICD-10 E11.9)  . cholecalciferol (VITAMIN D) 1000 UNITS tablet Take 1,000 Units by mouth daily.  . fish oil-omega-3 fatty acids 1000 MG capsule Take 1 g by mouth daily.    . hydrochlorothiazide (HYDRODIURIL) 25 MG tablet Take 1 tablet (25 mg total) by mouth daily.  . metFORMIN (GLUCOPHAGE) 500 MG tablet Take 1 tablet (500 mg total) by mouth 2 (two) times daily with a meal.  . mupirocin ointment (BACTROBAN) 2 % Apply BID to affected area  . omeprazole (PRILOSEC) 40 MG capsule TAKE ONE CAPSULE BY MOUTH ONCE DAILY BEFORE BREAKFAST  . rosuvastatin (CRESTOR) 10 MG tablet TAKE 1 TABLET (10 MG TOTAL) BY MOUTH DAILY.   No facility-administered encounter medications on file as of 12/03/2016.     1. Essential hypertension  No c/o chest pain, SOB or Headache- checks blood pressures on occasio which says  Is usually ok.  2. Gastroesophageal reflux disease without esophagitis  Omeprazole keeps symptoms under control  3. Type 2 diabetes mellitus without complication, without long-term current use of insulin (HCC) last HGBA1C was 6.7. Does not check fasting blood sugars everyday but when she does check it is usually 120's. Denies in low blood sugars  4. BMI 31.0-31.9,adult  No recent weight gain or weight loss  5.  MIxed Hyperlipidemia Tries to watch diet  6. Erectile dysfunction associated with type 2 diabetes mellitus ([place])  Currently not taking anything       New complaints: None today    Review of Systems  Constitutional: Negative for diaphoresis.  Eyes: Negative for pain.  Respiratory: Negative for shortness of breath.   Cardiovascular: Negative for chest pain, palpitations and leg swelling.  Gastrointestinal: Negative for abdominal pain.  Endocrine: Negative for polydipsia.  Skin: Negative for rash.  Neurological: Negative for dizziness, weakness and headaches.  Hematological: Does not bruise/bleed easily.       Objective:   Physical Exam  Constitutional: He is oriented to person, place, and time. He appears well-developed and well-nourished.  HENT:  Head: Normocephalic.  Right Ear: External ear normal.  Left Ear: External ear normal.  Nose: Nose normal.  Mouth/Throat: Oropharynx is clear and moist.  Eyes: EOM are normal. Pupils are equal, round, and reactive to light.  Neck: Normal range of motion. Neck supple. No JVD present. No thyromegaly present.  Cardiovascular: Normal rate, regular rhythm, normal heart sounds and intact distal pulses.  Exam reveals no gallop and no friction rub.   No murmur heard. Pulmonary/Chest: Effort normal and breath sounds normal. No respiratory distress. He has no wheezes. He has no rales. He exhibits no tenderness.  Abdominal: Soft. Bowel sounds are normal. He exhibits no mass. There is no tenderness.  Musculoskeletal: Normal range of motion. He exhibits no edema.  Lymphadenopathy:    He has no cervical adenopathy.  Neurological: He is alert and oriented to person, place, and time. No cranial nerve deficit.  Skin: Skin is warm and dry.  Psychiatric: He has a normal mood and affect. His behavior is normal. Judgment and thought content normal.    BP 121/72   Pulse 80   Temp 97.3 F (36.3 C) (Oral)   Ht 6' 1" (1.854 m)   Wt 234 lb (106.1  kg)   BMI 30.87 kg/m   HGBA1c 7.0% today      Assessment & Plan:  1. Essential hypertension Low sodium diet - benazepril (LOTENSIN) 40 MG tablet; Take 1 tablet (40 mg total) by mouth daily.  Dispense: 90 tablet; Refill: 1 - hydrochlorothiazide (HYDRODIURIL) 25 MG tablet; Take 1 tablet (25 mg total) by mouth daily.  Dispense: 90 tablet; Refill: 1 - amLODipine (NORVASC) 10 MG tablet; Take 1 tablet (10 mg total) by mouth daily.  Dispense: 90 tablet; Refill: 1 - CMP14+EGFR - CMP14+EGFR  2. Gastroesophageal reflux disease without esophagitis Avoid spicy foods Do not eat 2 hours prior to bedtime - omeprazole (PRILOSEC) 40 MG capsule; TAKE ONE CAPSULE BY MOUTH ONCE DAILY BEFORE BREAKFAST  Dispense: 90 capsule; Refill: 1  3. Type 2 diabetes mellitus without complication, without long-term current use of insulin (HCC) Continue  To watch carbs in diet - metFORMIN (GLUCOPHAGE) 500 MG tablet; Take 1 tablet (500 mg total) by mouth 2 (two) times daily with a meal.  Dispense: 180 tablet; Refill: 1 - Bayer DCA Hb A1c Waived - Bayer DCA Hb A1c Waived  4. BMI 31.0-31.9,adult Discussed diet and exercise for person with BMI >25 Will recheck weight in 3-6 months  5. Erectile dysfunction associated with type 2 diabetes mellitus ([place]) Patient will let me know if he needs anything  6. Mixed hyperlipidemia Continue to stay active - rosuvastatin (CRESTOR) 10 MG tablet; TAKE 1 TABLET (10 MG TOTAL) BY MOUTH DAILY.  Dispense: 90 tablet; Refill: 1 - Lipid panel - Lipid panel    Labs pending Health maintenance reviewed Diet and exercise encouraged Continue all meds Follow up  In 3 months   [place], FNP

## 2016-12-04 LAB — CMP14+EGFR
ALK PHOS: 58 IU/L (ref 39–117)
ALT: 24 IU/L (ref 0–44)
AST: 21 IU/L (ref 0–40)
Albumin/Globulin Ratio: 1.8 (ref 1.2–2.2)
Albumin: 4.5 g/dL (ref 3.6–4.8)
BILIRUBIN TOTAL: 2.7 mg/dL — AB (ref 0.0–1.2)
BUN / CREAT RATIO: 16 (ref 10–24)
BUN: 16 mg/dL (ref 8–27)
CO2: 25 mmol/L (ref 18–29)
Calcium: 9.7 mg/dL (ref 8.6–10.2)
Chloride: 99 mmol/L (ref 96–106)
Creatinine, Ser: 1 mg/dL (ref 0.76–1.27)
GFR calc non Af Amer: 77 mL/min/{1.73_m2} (ref >59)
GFR, EST AFRICAN AMERICAN: 89 mL/min/{1.73_m2} (ref >59)
GLUCOSE: 146 mg/dL — AB (ref 65–99)
Globulin, Total: 2.5 g/dL (ref 1.5–4.5)
Potassium: 4 mmol/L (ref 3.5–5.2)
Sodium: 141 mmol/L (ref 134–144)
Total Protein: 7 g/dL (ref 6.0–8.5)

## 2016-12-04 LAB — LIPID PANEL
CHOL/HDL RATIO: 3.3 ratio (ref 0.0–5.0)
Cholesterol, Total: 102 mg/dL (ref 100–199)
HDL: 31 mg/dL — ABNORMAL LOW (ref >39)
LDL CALC: 29 mg/dL (ref 0–99)
Triglycerides: 209 mg/dL — ABNORMAL HIGH (ref 0–149)
VLDL CHOLESTEROL CAL: 42 mg/dL — AB (ref 5–40)

## 2017-01-14 ENCOUNTER — Telehealth: Payer: Self-pay | Admitting: Nurse Practitioner

## 2017-01-14 NOTE — Telephone Encounter (Signed)
SPOKE TO PT

## 2017-01-26 ENCOUNTER — Encounter (INDEPENDENT_AMBULATORY_CARE_PROVIDER_SITE_OTHER): Payer: Self-pay

## 2017-01-26 ENCOUNTER — Ambulatory Visit (INDEPENDENT_AMBULATORY_CARE_PROVIDER_SITE_OTHER): Payer: Medicare HMO | Admitting: *Deleted

## 2017-01-26 DIAGNOSIS — Z0000 Encounter for general adult medical examination without abnormal findings: Secondary | ICD-10-CM | POA: Diagnosis not present

## 2017-01-26 NOTE — Patient Instructions (Addendum)
Thank you for seeing Jorge Garcia for your Annual Wellness Visit today!  Please keep your follow up appointment with Chevis Pretty, FNP on 03/05/17 at 8 am  Continue with walking 3 times per week for 30-45 minutes for exercise.  This summer make sure to walk early in the morning or later in the evenings when it is cooler  Please include mostly lean proteins, whole grains, vegetables, and fruits in your diet  Diabetes Mellitus and Food It is important for you to manage your blood sugar (glucose) level. Your blood glucose level can be greatly affected by what you eat. Eating healthier foods in the appropriate amounts throughout the day at about the same time each day will help you control your blood glucose level. It can also help slow or prevent worsening of your diabetes mellitus. Healthy eating may even help you improve the level of your blood pressure and reach or maintain a healthy weight. General recommendations for healthful eating and cooking habits include:  Eating meals and snacks regularly. Avoid going long periods of time without eating to lose weight.  Eating a diet that consists mainly of plant-based foods, such as fruits, vegetables, nuts, legumes, and whole grains.  Using low-heat cooking methods, such as baking, instead of high-heat cooking methods, such as deep frying.  Work with your dietitian to make sure you understand how to use the Nutrition Facts information on food labels. How can food affect me? Carbohydrates Carbohydrates affect your blood glucose level more than any other type of food. Your dietitian will help you determine how many carbohydrates to eat at each meal and teach you how to count carbohydrates. Counting carbohydrates is important to keep your blood glucose at a healthy level, especially if you are using insulin or taking certain medicines for diabetes mellitus. Alcohol Alcohol can cause sudden decreases in blood glucose (hypoglycemia), especially if you use  insulin or take certain medicines for diabetes mellitus. Hypoglycemia can be a life-threatening condition. Symptoms of hypoglycemia (sleepiness, dizziness, and disorientation) are similar to symptoms of having too much alcohol. If your health care provider has given you approval to drink alcohol, do so in moderation and use the following guidelines:  Women should not have more than one drink per day, and men should not have more than two drinks per day. One drink is equal to: ? 12 oz of beer. ? 5 oz of wine. ? 1 oz of hard liquor.  Do not drink on an empty stomach.  Keep yourself hydrated. Have water, diet soda, or unsweetened iced tea.  Regular soda, juice, and other mixers might contain a lot of carbohydrates and should be counted.  What foods are not recommended? As you make food choices, it is important to remember that all foods are not the same. Some foods have fewer nutrients per serving than other foods, even though they might have the same number of calories or carbohydrates. It is difficult to get your body what it needs when you eat foods with fewer nutrients. Examples of foods that you should avoid that are high in calories and carbohydrates but low in nutrients include:  Trans fats (most processed foods list trans fats on the Nutrition Facts label).  Regular soda.  Juice.  Candy.  Sweets, such as cake, pie, doughnuts, and cookies.  Fried foods.  What foods can I eat? Eat nutrient-rich foods, which will nourish your body and keep you healthy. The food you should eat also will depend on several factors, including:  The calories you need.  The medicines you take.  Your weight.  Your blood glucose level.  Your blood pressure level.  Your cholesterol level.  You should eat a variety of foods, including:  Protein. ? Lean cuts of meat. ? Proteins low in saturated fats, such as fish, egg whites, and beans. Avoid processed meats.  Fruits and  vegetables. ? Fruits and vegetables that may help control blood glucose levels, such as apples, mangoes, and yams.  Dairy products. ? Choose fat-free or low-fat dairy products, such as milk, yogurt, and cheese.  Grains, bread, pasta, and rice. ? Choose whole grain products, such as multigrain bread, whole oats, and brown rice. These foods may help control blood pressure.  Fats. ? Foods containing healthful fats, such as nuts, avocado, olive oil, canola oil, and fish.  Does everyone with diabetes mellitus have the same meal plan? Because every person with diabetes mellitus is different, there is not one meal plan that works for everyone. It is very important that you meet with a dietitian who will help you create a meal plan that is just right for you. This information is not intended to replace advice given to you by your health care provider. Make sure you discuss any questions you have with your health care provider. Document Released: 04/24/2005 Document Revised: 01/03/2016 Document Reviewed: 06/24/2013 Elsevier Interactive Patient Education  2017 [place] 69 Years and Older, Male Preventive care refers to lifestyle choices and visits with your health care provider that can promote health and wellness. What does preventive care include?  A yearly physical exam. This is also called an annual well check.  Dental exams once or twice a year.  Routine eye exams. Ask your health care provider how often you should have your eyes checked.  Personal lifestyle choices, including: ? Daily care of your teeth and gums. ? Regular physical activity. ? Eating a healthy diet. ? Avoiding tobacco and drug use. ? Limiting alcohol use. ? Practicing safe sex. ? Taking low doses of aspirin every day. ? Taking vitamin and mineral supplements as recommended by your health care provider. What happens during an annual well check? The services and screenings done by your  health care provider during your annual well check will depend on your age, overall health, lifestyle risk factors, and family history of disease. Counseling Your health care provider may ask you questions about your:  Alcohol use.  Tobacco use.  Drug use.  Emotional well-being.  Home and relationship well-being.  Sexual activity.  Eating habits.  History of falls.  Memory and ability to understand (cognition).  Work and work Statistician.  Screening You may have the following tests or measurements:  Height, weight, and BMI.  Blood pressure.  Lipid and cholesterol levels. These may be checked every 5 years, or more frequently if you are over 31 years old.  Skin check.  Lung cancer screening. You may have this screening every year starting at age 72 if you have a 30-pack-year history of smoking and currently smoke or have quit within the past 15 years.  Fecal occult blood test (FOBT) of the stool. You may have this test every year starting at age 63.  Flexible sigmoidoscopy or colonoscopy. You may have a sigmoidoscopy every 5 years or a colonoscopy every 10 years starting at age 71.  Prostate cancer screening. Recommendations will vary depending on your family history and other risks.  Hepatitis C blood test.  Hepatitis B  blood test.  Sexually transmitted disease (STD) testing.  Diabetes screening. This is done by checking your blood sugar (glucose) after you have not eaten for a while (fasting). You may have this done every 1-3 years.  Abdominal aortic aneurysm (AAA) screening. You may need this if you are a current or former smoker.  Osteoporosis. You may be screened starting at age 63 if you are at high risk.  Talk with your health care provider about your test results, treatment options, and if necessary, the need for more tests. Vaccines Your health care provider may recommend certain vaccines, such as:  Influenza vaccine. This is recommended every  year.  Tetanus, diphtheria, and acellular pertussis (Tdap, Td) vaccine. You may need a Td booster every 10 years.  Varicella vaccine. You may need this if you have not been vaccinated.  Zoster vaccine. You may need this after age 52.  Measles, mumps, and rubella (MMR) vaccine. You may need at least one dose of MMR if you were born in 1957 or later. You may also need a second dose.  Pneumococcal 13-valent conjugate (PCV13) vaccine. One dose is recommended after age 38.  Pneumococcal polysaccharide (PPSV23) vaccine. One dose is recommended after age 18.  Meningococcal vaccine. You may need this if you have certain conditions.  Hepatitis A vaccine. You may need this if you have certain conditions or if you travel or work in places where you may be exposed to hepatitis A.  Hepatitis B vaccine. You may need this if you have certain conditions or if you travel or work in places where you may be exposed to hepatitis B.  Haemophilus influenzae type b (Hib) vaccine. You may need this if you have certain risk factors.  Talk to your health care provider about which screenings and vaccines you need and how often you need them. This information is not intended to replace advice given to you by your health care provider. Make sure you discuss any questions you have with your health care provider. Document Released: 08/24/2015 Document Revised: 04/16/2016 Document Reviewed: 05/29/2015 Elsevier Interactive Patient Education  2017 [employer].

## 2017-01-26 NOTE — Progress Notes (Signed)
Subjective:   Jorge Garcia is a 71 y.o. male who presents for an Initial Medicare Annual Wellness Visit.  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  Most recently he worked at [employer], and retired in 2014.  Jorge Garcia enjoys hunting quail, and watching TV.  He attends [employer].  He lives alone, and has one nephew who lives locally.  He has 2 outdoor dogs.  Jorge Garcia feels his health is better than it was last year, states he is feeling better in general now.  He has not had any hospitalizations or ER visits.  His only surgery this year was having bilateral cataract removal.    Review of Systems  Patient has no complaints today.  Review of Systems negative       Objective:    Today's Vitals   01/26/17 1019  BP: 132/78  Pulse: 71  Weight: 229 lb (103.9 kg)  Height: 6' (1.829 m)   Body mass index is 31.06 kg/m.  Current Medications (verified) Outpatient Encounter Prescriptions as of 01/26/2017  Medication Sig  . ACCU-CHEK AVIVA PLUS test strip USE ONE STRIP TO CHECK GLUCOSE 4 TIMES DAILY  . ACCU-CHEK SOFTCLIX LANCETS lancets USE TO CHECK GLUCOSE 4 TIMES DAILY  . amLODipine (NORVASC) 10 MG tablet Take 1 tablet (10 mg total) by mouth daily. (Patient taking differently: Take 5 mg by mouth daily. )  . aspirin EC 81 MG tablet Take 81 mg by mouth daily.  . benazepril (LOTENSIN) 40 MG tablet Take 1 tablet (40 mg total) by mouth daily. (Patient taking differently: Take 20 mg by mouth daily. )  . blood glucose meter kit and supplies KIT Use up to four times daily as directed. (FOR ICD-10 E11.9)  . cholecalciferol (VITAMIN D) 1000 UNITS tablet Take 2,000 Units by mouth daily.   . fish oil-omega-3 fatty acids 1000 MG capsule Take 1 g by mouth daily.    . hydrochlorothiazide (HYDRODIURIL) 25 MG tablet Take 1 tablet (25 mg total) by mouth daily.  . metFORMIN (GLUCOPHAGE) 500 MG tablet Take 1 tablet (500 mg  total) by mouth 2 (two) times daily with a meal.  . mupirocin ointment (BACTROBAN) 2 % Apply BID to affected area  . omeprazole (PRILOSEC) 40 MG capsule TAKE ONE CAPSULE BY MOUTH ONCE DAILY BEFORE BREAKFAST  . rosuvastatin (CRESTOR) 10 MG tablet TAKE 1 TABLET (10 MG TOTAL) BY MOUTH DAILY.   No facility-administered encounter medications on file as of 01/26/2017.     Allergies (verified) Patient has no known allergies.   History: Past Medical History:  Diagnosis Date  . Diabetes mellitus without complication ([place])   . History of CVA (cerebrovascular accident)   . Hyperlipidemia   . Hypertension   . Internal hemorrhoids   . Tubulovillous adenoma of colon 2009  . Vitamin D deficiency    Past Surgical History:  Procedure Laterality Date  . HEMORRHOID SURGERY  1978   Family History  Problem Relation Age of Onset  . Diabetes Mother   . Hypertension Mother   . Diabetes Father   . Hypertension Father    Social History   Occupational History  . Retired Brewing technologist   Social History Main Topics  . Smoking status: Former Smoker    Quit date: 11/09/2005  . Smokeless tobacco: Never Used  . Alcohol use No  . Drug use: No  . Sexual activity: Not  on file     Activities of Daily Living In your present state of health, do you have any difficulty performing the following activities: 01/26/2017 12/03/2016  Hearing? Y N  Vision? N N  Difficulty concentrating or making decisions? Y N  Walking or climbing stairs? N N  Dressing or bathing? N N  Doing errands, shopping? N N  Some recent data might be hidden    Immunizations and Health Maintenance Immunization History  Administered Date(s) Administered  . Influenza, High Dose Seasonal PF 05/19/2016  . Influenza-Unspecified 05/11/2013, 05/20/2014, 05/26/2015  . Pneumococcal Conjugate-13 05/27/2013  . Pneumococcal Polysaccharide-23 12/22/2014  . Td 05/12/2003   There are no preventive care reminders to display for this  patient.  Patient Care Team: Chevis Pretty, FNP as PCP - General (Nurse Practitioner)  Indicate any recent Medical Services you may have received from other than [facility] providers in the past year (date may be approximate). Bilateral cataract removal - February and March 2018 Dr. Talbert Forest      Assessment:   This is a routine wellness examination for Jorge Garcia.   Hearing/Vision screen Wears glasses for reading, vision much improved since having cataracts removed earlier this year Wears hearing aid in left ear    Dietary issues and exercise activities discussed: Current Exercise Habits: Home exercise routine, Type of exercise: walking, Time (Minutes): 45, Frequency (Times/Week): 3, Weekly Exercise (Minutes/Week): 135, Intensity: Moderate  Goals    . Eat more fruits and vegetables          Increase vegetables servings to 3-5 per day        Depression Screen PHQ 2/9 Scores 01/26/2017 12/03/2016 08/19/2016 05/16/2016  PHQ - 2 Score 0 0 0 0    Fall Risk Fall Risk  01/26/2017 12/03/2016 08/19/2016 05/16/2016 10/30/2015  Falls in the past year? No No No No No    Cognitive Function: MMSE - Mini Mental State Exam 01/26/2017  Orientation to time 5  Orientation to Place 5  Registration 3  Attention/ Calculation 5  Recall 2  Language- name 2 objects 2  Language- repeat 0  Language- follow 3 step command 3  Language- read & follow direction 1  Write a sentence 1  Copy design 1  Total score 28        Screening Tests Health Maintenance  Topic Date Due  . INFLUENZA VACCINE  03/11/2017  . OPHTHALMOLOGY EXAM  05/12/2017  . HEMOGLOBIN A1C  06/04/2017  . COLON CANCER SCREENING ANNUAL FOBT  08/20/2017  . FOOT EXAM  12/03/2017  . COLONOSCOPY  12/02/2018  . TETANUS/TDAP  12/25/2022  . Hepatitis C Screening  Completed  . PNA vac Low Risk Adult  Completed   All up to date Sh[facility]rix recommended- patient states he will think about getting it at his next visit with Chevis Pretty,  FNP    Plan:     Increase servings of vegetables daily to 3-5 servings Continue walking 30-45 minutes at least 3 days per week   I have personally reviewed and noted the following in the patient's chart:   . Medical and social history . Use of alcohol, tobacco or illicit drugs  . Current medications and supplements . Functional ability and status . Nutritional status . Physical activity . Advanced directives . List of other physicians . Hospitalizations, surgeries, and ER visits in previous 12 months . Vitals . Screenings to include cognitive, depression, and falls . Referrals and appointments  In addition, I have reviewed and discussed with patient  certain preventive protocols, quality metrics, and best practice recommendations. A written personalized care plan for preventive services as well as general preventive health recommendations were provided to patient.     Felicidad Sugarman M, RN   01/26/2017   I have reviewed and agree with the above AWV documentation.   Assunta Found, MD [place]

## 2017-03-05 ENCOUNTER — Ambulatory Visit (INDEPENDENT_AMBULATORY_CARE_PROVIDER_SITE_OTHER): Payer: Medicare HMO | Admitting: Nurse Practitioner

## 2017-03-05 ENCOUNTER — Encounter: Payer: Self-pay | Admitting: Nurse Practitioner

## 2017-03-05 VITALS — BP 122/72 | HR 66 | Temp 97.1°F | Ht 72.0 in | Wt 230.0 lb

## 2017-03-05 DIAGNOSIS — Z6831 Body mass index (BMI) 31.0-31.9, adult: Secondary | ICD-10-CM | POA: Diagnosis not present

## 2017-03-05 DIAGNOSIS — N521 Erectile dysfunction due to diseases classified elsewhere: Secondary | ICD-10-CM

## 2017-03-05 DIAGNOSIS — E1169 Type 2 diabetes mellitus with other specified complication: Secondary | ICD-10-CM | POA: Diagnosis not present

## 2017-03-05 DIAGNOSIS — E119 Type 2 diabetes mellitus without complications: Secondary | ICD-10-CM

## 2017-03-05 DIAGNOSIS — E782 Mixed hyperlipidemia: Secondary | ICD-10-CM

## 2017-03-05 DIAGNOSIS — I10 Essential (primary) hypertension: Secondary | ICD-10-CM

## 2017-03-05 DIAGNOSIS — K219 Gastro-esophageal reflux disease without esophagitis: Secondary | ICD-10-CM | POA: Diagnosis not present

## 2017-03-05 DIAGNOSIS — E785 Hyperlipidemia, unspecified: Secondary | ICD-10-CM | POA: Diagnosis not present

## 2017-03-05 LAB — LIPID PANEL
CHOL/HDL RATIO: 3.2 ratio (ref 0.0–5.0)
CHOLESTEROL TOTAL: 103 mg/dL (ref 100–199)
HDL: 32 mg/dL — ABNORMAL LOW (ref >39)
LDL Calculated: 36 mg/dL (ref 0–99)
TRIGLYCERIDES: 174 mg/dL — AB (ref 0–149)
VLDL Cholesterol Cal: 35 mg/dL (ref 5–40)

## 2017-03-05 LAB — CMP14+EGFR
A/G RATIO: 1.9 (ref 1.2–2.2)
ALBUMIN: 4.5 g/dL (ref 3.6–4.8)
ALT: 20 IU/L (ref 0–44)
AST: 20 IU/L (ref 0–40)
Alkaline Phosphatase: 59 IU/L (ref 39–117)
BUN / CREAT RATIO: 13 (ref 10–24)
BUN: 15 mg/dL (ref 8–27)
Bilirubin Total: 2.1 mg/dL — ABNORMAL HIGH (ref 0.0–1.2)
CALCIUM: 9.9 mg/dL (ref 8.6–10.2)
CO2: 26 mmol/L (ref 20–29)
Chloride: 102 mmol/L (ref 96–106)
Creatinine, Ser: 1.13 mg/dL (ref 0.76–1.27)
GFR, EST AFRICAN AMERICAN: 76 mL/min/{1.73_m2} (ref >59)
GFR, EST NON AFRICAN AMERICAN: 66 mL/min/{1.73_m2} (ref >59)
GLOBULIN, TOTAL: 2.4 g/dL (ref 1.5–4.5)
GLUCOSE: 135 mg/dL — AB (ref 65–99)
POTASSIUM: 4.5 mmol/L (ref 3.5–5.2)
SODIUM: 142 mmol/L (ref 134–144)
Total Protein: 6.9 g/dL (ref 6.0–8.5)

## 2017-03-05 MED ORDER — METFORMIN HCL 500 MG PO TABS: 500.0000 mg | ORAL_TABLET | Freq: Two times a day (BID) | ORAL | 1 refills | Status: DC

## 2017-03-05 MED ORDER — AMLODIPINE BESYLATE 10 MG PO TABS: 10.0000 mg | ORAL_TABLET | Freq: Every day | ORAL | 1 refills | Status: DC

## 2017-03-05 MED ORDER — HYDROCHLOROTHIAZIDE 25 MG PO TABS: 25.0000 mg | ORAL_TABLET | Freq: Every day | ORAL | 1 refills | Status: DC

## 2017-03-05 MED ORDER — OMEPRAZOLE 40 MG PO CPDR: DELAYED_RELEASE_CAPSULE | ORAL | 1 refills | Status: DC

## 2017-03-05 MED ORDER — ROSUVASTATIN CALCIUM 10 MG PO TABS: ORAL_TABLET | ORAL | 1 refills | Status: DC

## 2017-03-05 NOTE — Patient Instructions (Signed)

## 2017-03-05 NOTE — Progress Notes (Signed)
Subjective:    Patient ID: Jorge Garcia, male    DOB: February 13, 1946, 71 y.o.   MRN: [id number]  HPI Jorge Garcia is here today for follow up of chronic medical problem.  Outpatient Encounter Prescriptions as of 03/05/2017  Medication Sig  . ACCU-CHEK AVIVA PLUS test strip USE ONE STRIP TO CHECK GLUCOSE 4 TIMES DAILY  . ACCU-CHEK SOFTCLIX LANCETS lancets USE TO CHECK GLUCOSE 4 TIMES DAILY  . amLODipine (NORVASC) 10 MG tablet Take 1 tablet (10 mg total) by mouth daily. (Patient taking differently: Take 5 mg by mouth daily. )  . aspirin EC 81 MG tablet Take 81 mg by mouth daily.  . benazepril (LOTENSIN) 40 MG tablet Take 1 tablet (40 mg total) by mouth daily. (Patient taking differently: Take 20 mg by mouth daily. )  . blood glucose meter kit and supplies KIT Use up to four times daily as directed. (FOR ICD-10 E11.9)  . cholecalciferol (VITAMIN D) 1000 UNITS tablet Take 2,000 Units by mouth daily.   . fish oil-omega-3 fatty acids 1000 MG capsule Take 1 g by mouth daily.    . hydrochlorothiazide (HYDRODIURIL) 25 MG tablet Take 1 tablet (25 mg total) by mouth daily.  . metFORMIN (GLUCOPHAGE) 500 MG tablet Take 1 tablet (500 mg total) by mouth 2 (two) times daily with a meal.  . mupirocin ointment (BACTROBAN) 2 % Apply BID to affected area  . omeprazole (PRILOSEC) 40 MG capsule TAKE ONE CAPSULE BY MOUTH ONCE DAILY BEFORE BREAKFAST  . rosuvastatin (CRESTOR) 10 MG tablet TAKE 1 TABLET (10 MG TOTAL) BY MOUTH DAILY.   No facility-administered encounter medications on file as of 03/05/2017.     1. Essential hypertension  Patient takes amlodipine, HCTZ, and benazepril.  Patient does not check blood pressure at home.  2. Gastroesophageal reflux disease without esophagitis  Patient manages symptoms with omeprazole.  No concerns at this time.  3. Erectile dysfunction associated with type 2 diabetes mellitus ([place])  No current concerns.  4. Type 2 diabetes mellitus without complication, without  long-term current use of insulin ([place])  Diabetes managed with metformin.  Patient checks blood glucose daily and it is usually 120s fasting.  Previous A1C 7.0 on 12/03/16.  5. Hyperlipidemia, unspecified hyperlipidemia type  Managed with rosuvastatin and low fat diet.  6. BMI 31.0-31.9,adult  No recent weight gain or loss.    New complaints: None today.  Social history: Lives alone- has a lady friend that he spends time with.   Review of Systems  Constitutional: Negative for activity change, appetite change and fatigue.  Respiratory: Negative for shortness of breath and wheezing.   Cardiovascular: Negative for chest pain and palpitations.  Gastrointestinal: Negative for abdominal distention and abdominal pain.  Neurological: Negative for dizziness and headaches.  All other systems reviewed and are negative.      Objective:   Physical Exam  Constitutional: He is oriented to person, place, and time. He appears well-developed and well-nourished. No distress.  HENT:  Head: Normocephalic.  Right Ear: External ear normal.  Left Ear: External ear normal.  Mouth/Throat: Oropharynx is clear and moist.  Eyes: Pupils are equal, round, and reactive to light.  Neck: Normal range of motion. Neck supple. No JVD present. No thyromegaly present.  Cardiovascular: Normal rate, regular rhythm, normal heart sounds and intact distal pulses.   No murmur heard. Pulmonary/Chest: Effort normal and breath sounds normal. No respiratory distress.  Abdominal: Soft. Bowel sounds are normal. He exhibits no distension. There is no  tenderness.  Musculoskeletal: Normal range of motion.  Lymphadenopathy:    He has no cervical adenopathy.  Neurological: He is alert and oriented to person, place, and time.  Skin: Skin is warm and dry.  Psychiatric: He has a normal mood and affect. His behavior is normal. Judgment and thought content normal.   BP 122/72   Pulse 66   Temp (!) 97.1 F (36.2 C) (Oral)   Ht 6'  (1.829 m)   Wt 230 lb (104.3 kg)   BMI 31.19 kg/m  A1C: 6.8%     Assessment & Plan:  1. Essential hypertension Low sodium diet - CMP14+EGFR - hydrochlorothiazide (HYDRODIURIL) 25 MG tablet; Take 1 tablet (25 mg total) by mouth daily.  Dispense: 90 tablet; Refill: 1 - amLODipine (NORVASC) 10 MG tablet; Take 1 tablet (10 mg total) by mouth daily.  Dispense: 90 tablet; Refill: 1  2. Gastroesophageal reflux disease without esophagitis Avoid spicy foods Do not eat 2 hours prior to bedtime - omeprazole (PRILOSEC) 40 MG capsule; TAKE ONE CAPSULE BY MOUTH ONCE DAILY BEFORE BREAKFAST  Dispense: 90 capsule; Refill: 1  3. Erectile dysfunction associated with type 2 diabetes mellitus ([place])  4. Type 2 diabetes mellitus without complication, without long-term current use of insulin (HCC) Continue  To watch carbsin diet - Bayer DCA Hb A1c Waived - metFORMIN (GLUCOPHAGE) 500 MG tablet; Take 1 tablet (500 mg total) by mouth 2 (two) times daily with a meal.  Dispense: 180 tablet; Refill: 1  5. Hyperlipidemia, unspecified hyperlipidemia type Low fat diet - Lipid panel- rosuvastatin (CRESTOR) 10 MG tablet; TAKE 1 TABLET (10 MG TOTAL) BY MOUTH DAILY.  Dispense: 90 tablet; Refill: 1   6. BMI 31.0-31.9,adult Discussed diet and exercise for person with BMI >25 Will recheck weight in 3-6 months      Labs pending Health maintenance reviewed Diet and exercise encouraged Continue all meds Follow up  In 3 months   [place], FNP

## 2017-03-06 LAB — BAYER DCA HB A1C WAIVED: HB A1C: 6.9 % (ref <7.0)

## 2017-05-01 ENCOUNTER — Encounter: Payer: Self-pay | Admitting: Nurse Practitioner

## 2017-05-01 ENCOUNTER — Ambulatory Visit (INDEPENDENT_AMBULATORY_CARE_PROVIDER_SITE_OTHER): Payer: Medicare HMO | Admitting: Nurse Practitioner

## 2017-05-01 VITALS — BP 117/70 | HR 66 | Temp 97.1°F | Ht 72.0 in | Wt 229.0 lb

## 2017-05-01 DIAGNOSIS — S8012XA Contusion of left lower leg, initial encounter: Secondary | ICD-10-CM | POA: Diagnosis not present

## 2017-05-01 NOTE — Progress Notes (Signed)
   Subjective:    Patient ID: Jorge Garcia, male    DOB: 10-May-1946, 71 y.o.   MRN: [id number]  HPI Lawnmower fail over on his left leg 1 week ago. He is concerned because it is still swollen. denies any pain.    Review of Systems  Constitutional: Negative.   Respiratory: Negative.   Cardiovascular: Negative.   Gastrointestinal: Negative.   Neurological: Negative.   Psychiatric/Behavioral: Negative.   All other systems reviewed and are negative.      Objective:   Physical Exam  Constitutional: He appears well-developed and well-nourished. No distress.  Cardiovascular: Normal rate and regular rhythm.   Pulmonary/Chest: Effort normal and breath sounds normal.  Skin: Skin is warm.  10cm non tender contusion on left upper thigh.  Psychiatric: He has a normal mood and affect. His behavior is normal. Judgment and thought content normal.   BP 117/70   Pulse 66   Temp (!) 97.1 F (36.2 C) (Oral)   Ht 6' (1.829 m)   Wt 229 lb (103.9 kg)   BMI 31.06 kg/m       Assessment & Plan:   1. Contusion of left lower extremity, initial encounter    Can take weeks to resolve Ice bid RTO if worsens  Mary-Margaret Hassell Done, FNP

## 2017-05-01 NOTE — Patient Instructions (Signed)
Contusion A contusion is a deep bruise. Contusions happen when an injury causes bleeding under the skin. Symptoms of bruising include pain, swelling, and discolored skin. The skin may turn blue, purple, or yellow. Follow these instructions at home:  Rest the injured area.  If told, put ice on the injured area. ? Put ice in a plastic bag. ? Place a towel between your skin and the bag. ? Leave the ice on for 20 minutes, 2-3 times per day.  If told, put light pressure (compression) on the injured area using an elastic bandage. Make sure the bandage is not too tight. Remove it and put it back on as told by your doctor.  If possible, raise (elevate) the injured area above the level of your heart while you are sitting or lying down.  Take over-the-counter and prescription medicines only as told by your doctor. Contact a doctor if:  Your symptoms do not get better after several days of treatment.  Your symptoms get worse.  You have trouble moving the injured area. Get help right away if:  You have very bad pain.  You have a loss of feeling (numbness) in a hand or foot.  Your hand or foot turns pale or cold. This information is not intended to replace advice given to you by your health care provider. Make sure you discuss any questions you have with your health care provider. Document Released: 01/14/2008 Document Revised: 01/03/2016 Document Reviewed: 12/13/2014 Elsevier Interactive Patient Education  2018 [employer] Inc[employer]  

## 2017-05-26 ENCOUNTER — Ambulatory Visit (INDEPENDENT_AMBULATORY_CARE_PROVIDER_SITE_OTHER): Payer: Medicare HMO

## 2017-05-26 DIAGNOSIS — Z23 Encounter for immunization: Secondary | ICD-10-CM

## 2017-06-15 ENCOUNTER — Encounter: Payer: Self-pay | Admitting: Nurse Practitioner

## 2017-06-15 ENCOUNTER — Ambulatory Visit: Payer: Medicare HMO | Admitting: Nurse Practitioner

## 2017-06-15 VITALS — BP 114/72 | HR 73 | Temp 98.2°F | Ht 72.0 in | Wt 230.0 lb

## 2017-06-15 DIAGNOSIS — Z6831 Body mass index (BMI) 31.0-31.9, adult: Secondary | ICD-10-CM | POA: Diagnosis not present

## 2017-06-15 DIAGNOSIS — K219 Gastro-esophageal reflux disease without esophagitis: Secondary | ICD-10-CM

## 2017-06-15 DIAGNOSIS — E782 Mixed hyperlipidemia: Secondary | ICD-10-CM

## 2017-06-15 DIAGNOSIS — E119 Type 2 diabetes mellitus without complications: Secondary | ICD-10-CM

## 2017-06-15 DIAGNOSIS — I10 Essential (primary) hypertension: Secondary | ICD-10-CM

## 2017-06-15 DIAGNOSIS — E785 Hyperlipidemia, unspecified: Secondary | ICD-10-CM

## 2017-06-15 LAB — BAYER DCA HB A1C WAIVED: HB A1C (BAYER DCA - WAIVED): 6.9 % (ref <7.0)

## 2017-06-15 MED ORDER — AMLODIPINE BESYLATE 10 MG PO TABS: 10.0000 mg | ORAL_TABLET | Freq: Every day | ORAL | 1 refills | Status: DC

## 2017-06-15 MED ORDER — ROSUVASTATIN CALCIUM 10 MG PO TABS: ORAL_TABLET | ORAL | 1 refills | Status: DC

## 2017-06-15 MED ORDER — BENAZEPRIL HCL 40 MG PO TABS: 40.0000 mg | ORAL_TABLET | Freq: Every day | ORAL | 1 refills | Status: DC

## 2017-06-15 MED ORDER — HYDROCHLOROTHIAZIDE 25 MG PO TABS: 25.0000 mg | ORAL_TABLET | Freq: Every day | ORAL | 1 refills | Status: DC

## 2017-06-15 MED ORDER — METFORMIN HCL 500 MG PO TABS: 500.0000 mg | ORAL_TABLET | Freq: Two times a day (BID) | ORAL | 1 refills | Status: DC

## 2017-06-15 MED ORDER — OMEPRAZOLE 40 MG PO CPDR: DELAYED_RELEASE_CAPSULE | ORAL | 1 refills | Status: DC

## 2017-06-15 NOTE — Patient Instructions (Signed)

## 2017-06-15 NOTE — Progress Notes (Signed)
Subjective:    Patient ID: Jorge Garcia, male    DOB: [birth year], 71 y.o.   MRN: [id number]  HPI  Jorge Garcia is here today for follow up of chronic medical problem.  Outpatient Encounter Medications as of 06/15/2017  Medication Sig  . ACCU-CHEK AVIVA PLUS test strip USE ONE STRIP TO CHECK GLUCOSE 4 TIMES DAILY  . ACCU-CHEK SOFTCLIX LANCETS lancets USE TO CHECK GLUCOSE 4 TIMES DAILY  . amLODipine (NORVASC) 10 MG tablet Take 1 tablet (10 mg total) by mouth daily.  [place] Kitchen aspirin EC 81 MG tablet Take 81 mg by mouth daily.  . benazepril (LOTENSIN) 40 MG tablet Take 1 tablet (40 mg total) by mouth daily. (Patient taking differently: Take 20 mg by mouth daily. )  . blood glucose meter kit and supplies KIT Use up to four times daily as directed. (FOR ICD-10 E11.9)  . cholecalciferol (VITAMIN D) 1000 UNITS tablet Take 2,000 Units by mouth daily.   . fish oil-omega-3 fatty acids 1000 MG capsule Take 1 g by mouth daily.    . hydrochlorothiazide (HYDRODIURIL) 25 MG tablet Take 1 tablet (25 mg total) by mouth daily.  . metFORMIN (GLUCOPHAGE) 500 MG tablet Take 1 tablet (500 mg total) by mouth 2 (two) times daily with a meal.  . mupirocin ointment (BACTROBAN) 2 % Apply BID to affected area  . omeprazole (PRILOSEC) 40 MG capsule TAKE ONE CAPSULE BY MOUTH ONCE DAILY BEFORE BREAKFAST  . rosuvastatin (CRESTOR) 10 MG tablet TAKE 1 TABLET (10 MG TOTAL) BY MOUTH DAILY.   No facility-administered encounter medications on file as of 06/15/2017.     1. Essential hypertension  No c/o chest pain ,sob or headache. Does not check blood pressures at home. BP Readings from Last 3 Encounters:  05/01/17 117/70  03/05/17 122/72  01/26/17 132/78     2. Gastroesophageal reflux disease without esophagitis  Uses omeprazole daily- has symptome if he does not take.  3. Type 2 diabetes mellitus without complication, without long-term current use of insulin (HCC) HGBA1c 6.9%. He does not check his blood sugars every day.   4. Erectile dysfunction associated with type 2 diabetes mellitus ([place])  No complaints  5. Hyperlipidemia, unspecified hyperlipidemia type  Does not watch diet  6. BMI 31.0-31.9,adult  No recent weight chnages    New complaints: None today  Social history: Lives alone- is retired      Review of Systems  Constitutional: Negative for activity change and appetite change.  HENT: Negative.   Eyes: Negative for pain.  Respiratory: Negative for shortness of breath.   Cardiovascular: Negative for chest pain, palpitations and leg swelling.  Gastrointestinal: Negative for abdominal pain.  Endocrine: Negative for polydipsia.  Genitourinary: Negative.   Skin: Negative for rash.  Neurological: Negative for dizziness, weakness and headaches.  Hematological: Does not bruise/bleed easily.  Psychiatric/Behavioral: Negative.   All other systems reviewed and are negative.      Objective:   Physical Exam  Constitutional: He is oriented to person, place, and time. He appears well-developed and well-nourished.  HENT:  Head: Normocephalic.  Right Ear: External ear normal.  Left Ear: External ear normal.  Nose: Nose normal.  Mouth/Throat: Oropharynx is clear and moist.  Eyes: EOM are normal. Pupils are equal, round, and reactive to light.  Neck: Normal range of motion. Neck supple. No JVD present. No thyromegaly present.  Cardiovascular: Normal rate, regular rhythm, normal heart sounds and intact distal pulses. Exam reveals no gallop and no friction rub.  No murmur heard. Pulmonary/Chest: Effort normal and breath sounds normal. No respiratory distress. He has no wheezes. He has no rales. He exhibits no tenderness.  Abdominal: Soft. Bowel sounds are normal. He exhibits no mass. There is no tenderness.  Genitourinary: Prostate normal and penis normal.  Musculoskeletal: Normal range of motion. He exhibits no edema.  Lymphadenopathy:    He has no cervical adenopathy.  Neurological: He is  alert and oriented to person, place, and time. No cranial nerve deficit.  Skin: Skin is warm and dry.  Psychiatric: He has a normal mood and affect. His behavior is normal. Judgment and thought content normal.    BP 114/72   Pulse 73   Temp 98.2 F (36.8 C) (Oral)   Ht 6' (1.829 m)   Wt 230 lb (104.3 kg)   BMI 31.19 kg/m        Assessment & Plan:  1. Essential hypertension Low sodium diet - benazepril (LOTENSIN) 40 MG tablet; Take 1 tablet (40 mg total) daily by mouth. (Patient taking differently: Take 40 mg daily by mouth. )  Dispense: 90 tablet; Refill: 1 - hydrochlorothiazide (HYDRODIURIL) 25 MG tablet; Take 1 tablet (25 mg total) daily by mouth.  Dispense: 90 tablet; Refill: 1 - amLODipine (NORVASC) 10 MG tablet; Take 1 tablet (10 mg total) daily by mouth. (Patient taking differently: Take 10 mg daily by mouth. )  Dispense: 90 tablet; Refill: 1 - CMP14+EGFR  2. Gastroesophageal reflux disease without esophagitis Avoid spicy foods Do not eat 2 hours prior to bedtime - omeprazole (PRILOSEC) 40 MG capsule; TAKE ONE CAPSULE BY MOUTH ONCE DAILY BEFORE BREAKFAST  Dispense: 90 capsule; Refill: 1  3. Type 2 diabetes mellitus without complication, without long-term current use of insulin (HCC) Continue to watch carbs in diet - Bayer DCA Hb A1c Waived - metFORMIN (GLUCOPHAGE) 500 MG tablet; Take 1 tablet (500 mg total) 2 (two) times daily with a meal by mouth.  Dispense: 180 tablet; Refill: 1  4. Erectile dysfunction associated with type 2 diabetes mellitus ([place])  5.  Mixed hyperlipidemia - rosuvastatin (CRESTOR) 10 MG tablet; TAKE 1 TABLET (10 MG TOTAL) BY MOUTH DAILY.  Dispense: 90 tablet; Refill: 1 Low fat diet - Lipid panel  6. BMI 31.0-31.9,adult Discussed diet and exercise for person with BMI >25 Will recheck weight in 3-6 months   Continue all meds Labs pending Health Maintenance reviewed Diet and exercise encouraged RTO 3 months  Mary-Margaret Hassell Done, FNP

## 2017-06-16 LAB — CMP14+EGFR
ALBUMIN: 4.7 g/dL (ref 3.6–4.8)
ALT: 16 IU/L (ref 0–44)
AST: 17 IU/L (ref 0–40)
Albumin/Globulin Ratio: 1.8 (ref 1.2–2.2)
Alkaline Phosphatase: 60 IU/L (ref 39–117)
BUN / CREAT RATIO: 10 (ref 10–24)
BUN: 12 mg/dL (ref 8–27)
Bilirubin Total: 2.5 mg/dL — ABNORMAL HIGH (ref 0.0–1.2)
CALCIUM: 9.8 mg/dL (ref 8.6–10.2)
CO2: 24 mmol/L (ref 20–29)
CREATININE: 1.16 mg/dL (ref 0.76–1.27)
Chloride: 102 mmol/L (ref 96–106)
GFR calc non Af Amer: 64 mL/min/{1.73_m2} (ref >59)
GFR, EST AFRICAN AMERICAN: 74 mL/min/{1.73_m2} (ref >59)
GLUCOSE: 149 mg/dL — AB (ref 65–99)
Globulin, Total: 2.6 g/dL (ref 1.5–4.5)
Potassium: 4.2 mmol/L (ref 3.5–5.2)
Sodium: 145 mmol/L — ABNORMAL HIGH (ref 134–144)
TOTAL PROTEIN: 7.3 g/dL (ref 6.0–8.5)

## 2017-06-16 LAB — LIPID PANEL
Chol/HDL Ratio: 2.9 ratio (ref 0.0–5.0)
Cholesterol, Total: 107 mg/dL (ref 100–199)
HDL: 37 mg/dL — AB (ref >39)
LDL CALC: 40 mg/dL (ref 0–99)
Triglycerides: 150 mg/dL — ABNORMAL HIGH (ref 0–149)
VLDL CHOLESTEROL CAL: 30 mg/dL (ref 5–40)

## 2017-09-07 ENCOUNTER — Other Ambulatory Visit: Payer: Self-pay | Admitting: Nurse Practitioner

## 2017-09-17 ENCOUNTER — Encounter: Payer: Self-pay | Admitting: Nurse Practitioner

## 2017-09-17 ENCOUNTER — Ambulatory Visit (INDEPENDENT_AMBULATORY_CARE_PROVIDER_SITE_OTHER): Payer: Medicare HMO

## 2017-09-17 ENCOUNTER — Ambulatory Visit (INDEPENDENT_AMBULATORY_CARE_PROVIDER_SITE_OTHER): Payer: Medicare HMO | Admitting: Nurse Practitioner

## 2017-09-17 VITALS — BP 111/66 | HR 74 | Temp 97.6°F | Ht 72.0 in | Wt 230.0 lb

## 2017-09-17 DIAGNOSIS — Z0000 Encounter for general adult medical examination without abnormal findings: Secondary | ICD-10-CM | POA: Diagnosis not present

## 2017-09-17 DIAGNOSIS — E119 Type 2 diabetes mellitus without complications: Secondary | ICD-10-CM

## 2017-09-17 DIAGNOSIS — Z6831 Body mass index (BMI) 31.0-31.9, adult: Secondary | ICD-10-CM

## 2017-09-17 DIAGNOSIS — I10 Essential (primary) hypertension: Secondary | ICD-10-CM | POA: Diagnosis not present

## 2017-09-17 DIAGNOSIS — K219 Gastro-esophageal reflux disease without esophagitis: Secondary | ICD-10-CM | POA: Diagnosis not present

## 2017-09-17 DIAGNOSIS — E785 Hyperlipidemia, unspecified: Secondary | ICD-10-CM | POA: Diagnosis not present

## 2017-09-17 LAB — BAYER DCA HB A1C WAIVED: HB A1C (BAYER DCA - WAIVED): 6.7 % (ref <7.0)

## 2017-09-17 NOTE — Progress Notes (Signed)
Subjective:    Patient ID: Jorge Garcia, male    DOB: March 06, 1946, 72 y.o.   MRN: [id number]  HPI   Jorge Garcia is here today for follow up of chronic medical problem.  Outpatient Encounter Medications as of 09/17/2017  Medication Sig  . ACCU-CHEK AVIVA PLUS test strip USE ONE STRIP TO CHECK GLUCOSE 4 TIMES DAILY  . ACCU-CHEK SOFTCLIX LANCETS lancets USE ONE TO CHECK GLUCOSE 4 TIMES DAILY  . amLODipine (NORVASC) 10 MG tablet Take 1 tablet (10 mg total) daily by mouth. (Patient taking differently: Take 10 mg daily by mouth. )  . aspirin EC 81 MG tablet Take 81 mg by mouth daily.  . benazepril (LOTENSIN) 40 MG tablet Take 1 tablet (40 mg total) daily by mouth. (Patient taking differently: Take 40 mg daily by mouth. )  . blood glucose meter kit and supplies KIT Use up to four times daily as directed. (FOR ICD-10 E11.9)  . cholecalciferol (VITAMIN D) 1000 UNITS tablet Take 2,000 Units by mouth daily.   . fish oil-omega-3 fatty acids 1000 MG capsule Take 1 g by mouth daily.    . hydrochlorothiazide (HYDRODIURIL) 25 MG tablet Take 1 tablet (25 mg total) daily by mouth.  . metFORMIN (GLUCOPHAGE) 500 MG tablet Take 1 tablet (500 mg total) 2 (two) times daily with a meal by mouth.  . mupirocin ointment (BACTROBAN) 2 % Apply BID to affected area  . omeprazole (PRILOSEC) 40 MG capsule TAKE ONE CAPSULE BY MOUTH ONCE DAILY BEFORE BREAKFAST  . rosuvastatin (CRESTOR) 10 MG tablet TAKE 1 TABLET (10 MG TOTAL) BY MOUTH DAILY.     1. Essential hypertension  No c/o chest pain, sob or headache. Does not check blood pressure at home. BP Readings from Last 3 Encounters:  09/17/17 111/66  06/15/17 114/72  05/01/17 117/70     2. Type 2 diabetes mellitus without complication, without long-term current use of insulin ([place]) last hgba1c was 6.9%. Does not check blood sugars everyday. No hypoglycemic episodes that he is aware of.  3. Hyperlipidemia, unspecified hyperlipidemia type  Says he tries to avoid  fried foods  4. Gastroesophageal reflux disease without esophagitis  Takes omeprazole daily- has symptoms if does not take.  5. BMI 31.0-31.9,adult   no recent weight changes.    New complaints: None today  Social history:  Retired- lives alone- is in a relationship.  Review of Systems  Constitutional: Negative for activity change and appetite change.  HENT: Negative.   Eyes: Negative for pain.  Respiratory: Negative for shortness of breath.   Cardiovascular: Negative for chest pain, palpitations and leg swelling.  Gastrointestinal: Negative for abdominal pain.  Endocrine: Negative for polydipsia.  Genitourinary: Negative.   Skin: Negative for rash.  Neurological: Negative for dizziness, weakness and headaches.  Hematological: Does not bruise/bleed easily.  Psychiatric/Behavioral: Negative.   All other systems reviewed and are negative.      Objective:   Physical Exam  Constitutional: He is oriented to person, place, and time. He appears well-developed and well-nourished.  HENT:  Head: Normocephalic.  Right Ear: External ear normal.  Left Ear: External ear normal.  Nose: Nose normal.  Mouth/Throat: Oropharynx is clear and moist.  Eyes: EOM are normal. Pupils are equal, round, and reactive to light.  Neck: Normal range of motion. Neck supple. No JVD present. No thyromegaly present.  Cardiovascular: Normal rate, regular rhythm, normal heart sounds and intact distal pulses. Exam reveals no gallop and no friction rub.  No murmur heard.  Pulmonary/Chest: Effort normal and breath sounds normal. No respiratory distress. He has no wheezes. He has no rales. He exhibits no tenderness.  Abdominal: Soft. Bowel sounds are normal. He exhibits no mass. There is no tenderness.  Musculoskeletal: Normal range of motion. He exhibits no edema.  Lymphadenopathy:    He has no cervical adenopathy.  Neurological: He is alert and oriented to person, place, and time. No cranial nerve deficit.    Skin: Skin is warm and dry.  Psychiatric: He has a normal mood and affect. His behavior is normal. Judgment and thought content normal.    BP 111/66   Pulse 74   Temp 97.6 F (36.4 C) (Oral)   Ht 6' (1.829 m)   Wt 230 lb (104.3 kg)   BMI 31.19 kg/m   EKG- NSR-Mary-Margaret Hassell Done, FNP  Chest x ray- no cardiopulmonary disease noted-Preliminary reading by Ronnald Collum, FNP  [facility]  hgba1c 6.7% today     Assessment & Plan:  1. Essential hypertension Low sodium diet - CMP14+EGFR - DG Chest 2 View; Future - EKG 12-Lead  2. Type 2 diabetes mellitus without complication, without long-term current use of insulin (HCC) Continue to watch carbs in diet - Bayer DCA Hb A1c Waived - Microalbumin / creatinine urine ratio  3. Hyperlipidemia, unspecified hyperlipidemia type Low fat diet - Lipid panel  4. Gastroesophageal reflux disease without esophagitis Avoid spicy foods Do not eat 2 hours prior to bedtime  5. BMI 31.0-31.9,adult Discussed diet and exercise for person with BMI >25 Will recheck weight in 3-6 months    Labs pending Health maintenance reviewed Diet and exercise encouraged Continue all meds Follow up  In 3 months   [place], FNP

## 2017-09-17 NOTE — Patient Instructions (Signed)

## 2017-09-18 LAB — CMP14+EGFR
ALBUMIN: 4.7 g/dL (ref 3.6–4.8)
ALT: 24 IU/L (ref 0–44)
AST: 26 IU/L (ref 0–40)
Albumin/Globulin Ratio: 1.8 (ref 1.2–2.2)
Alkaline Phosphatase: 60 IU/L (ref 39–117)
BUN / CREAT RATIO: 13 (ref 10–24)
BUN: 15 mg/dL (ref 8–27)
Bilirubin Total: 3 mg/dL — ABNORMAL HIGH (ref 0.0–1.2)
CALCIUM: 9.9 mg/dL (ref 8.6–10.2)
CO2: 25 mmol/L (ref 20–29)
CREATININE: 1.18 mg/dL (ref 0.76–1.27)
Chloride: 99 mmol/L (ref 96–106)
GFR, EST AFRICAN AMERICAN: 72 mL/min/{1.73_m2} (ref >59)
GFR, EST NON AFRICAN AMERICAN: 63 mL/min/{1.73_m2} (ref >59)
GLOBULIN, TOTAL: 2.6 g/dL (ref 1.5–4.5)
Glucose: 141 mg/dL — ABNORMAL HIGH (ref 65–99)
Potassium: 4.2 mmol/L (ref 3.5–5.2)
SODIUM: 139 mmol/L (ref 134–144)
Total Protein: 7.3 g/dL (ref 6.0–8.5)

## 2017-09-18 LAB — LIPID PANEL
CHOL/HDL RATIO: 2.8 ratio (ref 0.0–5.0)
CHOLESTEROL TOTAL: 105 mg/dL (ref 100–199)
HDL: 38 mg/dL — ABNORMAL LOW (ref >39)
LDL Calculated: 46 mg/dL (ref 0–99)
TRIGLYCERIDES: 104 mg/dL (ref 0–149)
VLDL Cholesterol Cal: 21 mg/dL (ref 5–40)

## 2017-12-15 ENCOUNTER — Encounter: Payer: Self-pay | Admitting: Nurse Practitioner

## 2017-12-15 ENCOUNTER — Ambulatory Visit (INDEPENDENT_AMBULATORY_CARE_PROVIDER_SITE_OTHER): Payer: Medicare HMO | Admitting: Nurse Practitioner

## 2017-12-15 VITALS — BP 108/68 | HR 71 | Temp 97.1°F | Ht 72.0 in | Wt 226.0 lb

## 2017-12-15 DIAGNOSIS — Z6831 Body mass index (BMI) 31.0-31.9, adult: Secondary | ICD-10-CM | POA: Diagnosis not present

## 2017-12-15 DIAGNOSIS — E7800 Pure hypercholesterolemia, unspecified: Secondary | ICD-10-CM | POA: Diagnosis not present

## 2017-12-15 DIAGNOSIS — E782 Mixed hyperlipidemia: Secondary | ICD-10-CM

## 2017-12-15 DIAGNOSIS — K219 Gastro-esophageal reflux disease without esophagitis: Secondary | ICD-10-CM

## 2017-12-15 DIAGNOSIS — I10 Essential (primary) hypertension: Secondary | ICD-10-CM | POA: Diagnosis not present

## 2017-12-15 DIAGNOSIS — E119 Type 2 diabetes mellitus without complications: Secondary | ICD-10-CM | POA: Diagnosis not present

## 2017-12-15 DIAGNOSIS — E785 Hyperlipidemia, unspecified: Secondary | ICD-10-CM | POA: Diagnosis not present

## 2017-12-15 LAB — BAYER DCA HB A1C WAIVED: HB A1C (BAYER DCA - WAIVED): 6.9 % (ref <7.0)

## 2017-12-15 MED ORDER — BENAZEPRIL HCL 40 MG PO TABS: 40.0000 mg | ORAL_TABLET | Freq: Every day | ORAL | 1 refills | Status: DC

## 2017-12-15 MED ORDER — OMEPRAZOLE 40 MG PO CPDR: DELAYED_RELEASE_CAPSULE | ORAL | 1 refills | Status: DC

## 2017-12-15 MED ORDER — AMLODIPINE BESYLATE 10 MG PO TABS: 10.0000 mg | ORAL_TABLET | Freq: Every day | ORAL | 1 refills | Status: DC

## 2017-12-15 MED ORDER — METFORMIN HCL 500 MG PO TABS: 500.0000 mg | ORAL_TABLET | Freq: Two times a day (BID) | ORAL | 1 refills | Status: DC

## 2017-12-15 MED ORDER — ROSUVASTATIN CALCIUM 10 MG PO TABS: ORAL_TABLET | ORAL | 1 refills | Status: DC

## 2017-12-15 MED ORDER — HYDROCHLOROTHIAZIDE 25 MG PO TABS: 25.0000 mg | ORAL_TABLET | Freq: Every day | ORAL | 1 refills | Status: DC

## 2017-12-15 NOTE — Patient Instructions (Signed)

## 2017-12-15 NOTE — Progress Notes (Addendum)
Subjective:    Patient ID: Jorge Garcia, male    DOB: [birth year], 72 y.o.   MRN: [id number]   Chief Complaint: Medical Management of Chronic Issues   HPI:  1. Type 2 diabetes mellitus without complication, without long-term current use of insulin (HCC) last HGBA1c was 6.7%  His blood sugars have been running below 140 consistently. No signs of hypoglycemia.  2. Hyperlipidemia, unspecified hyperlipidemia type  He does watch fats in his diet.  3. Essential hypertension  No c/o chest pain, sob or headache. Does not check blood pressure at home.  4. Gastroesophageal reflux disease without esophagitis  takes omeprazole daily.  5. BMI 31.0-31.9,adult  No recent weight changes    Outpatient Encounter Medications as of 12/15/2017  Medication Sig  . ACCU-CHEK AVIVA PLUS test strip USE ONE STRIP TO CHECK GLUCOSE 4 TIMES DAILY  . ACCU-CHEK SOFTCLIX LANCETS lancets USE ONE TO CHECK GLUCOSE 4 TIMES DAILY  . amLODipine (NORVASC) 10 MG tablet Take 1 tablet (10 mg total) daily by mouth. (Patient taking differently: Take 10 mg daily by mouth. )  . aspirin EC 81 MG tablet Take 81 mg by mouth daily.  . benazepril (LOTENSIN) 40 MG tablet Take 1 tablet (40 mg total) daily by mouth. (Patient taking differently: Take 40 mg daily by mouth. )  . blood glucose meter kit and supplies KIT Use up to four times daily as directed. (FOR ICD-10 E11.9)  . cholecalciferol (VITAMIN D) 1000 UNITS tablet Take 2,000 Units by mouth daily.   . fish oil-omega-3 fatty acids 1000 MG capsule Take 1 g by mouth daily.    . hydrochlorothiazide (HYDRODIURIL) 25 MG tablet Take 1 tablet (25 mg total) daily by mouth.  . metFORMIN (GLUCOPHAGE) 500 MG tablet Take 1 tablet (500 mg total) 2 (two) times daily with a meal by mouth.  . mupirocin ointment (BACTROBAN) 2 % Apply BID to affected area  . omeprazole (PRILOSEC) 40 MG capsule TAKE ONE CAPSULE BY MOUTH ONCE DAILY BEFORE BREAKFAST  . rosuvastatin (CRESTOR) 10 MG tablet TAKE 1  TABLET (10 MG TOTAL) BY MOUTH DAILY.      New complaints: None today  Social history: Lives alone but has someone he sees on a regular basis.  Review of Systems  Constitutional: Negative for activity change and appetite change.  HENT: Negative.   Eyes: Negative for pain.  Respiratory: Negative for shortness of breath.   Cardiovascular: Negative for chest pain, palpitations and leg swelling.  Gastrointestinal: Negative for abdominal pain.  Endocrine: Negative for polydipsia.  Genitourinary: Negative.   Skin: Negative for rash.  Neurological: Negative for dizziness, weakness and headaches.  Hematological: Does not bruise/bleed easily.  Psychiatric/Behavioral: Negative.   All other systems reviewed and are negative.      Objective:   Physical Exam  Constitutional: He is oriented to person, place, and time.  HENT:  Head: Normocephalic.  Nose: Nose normal.  Mouth/Throat: Oropharynx is clear and moist.  Eyes: Pupils are equal, round, and reactive to light. EOM are normal.  Neck: Normal range of motion and phonation normal. Neck supple. No JVD present. Carotid bruit is not present. No thyroid mass and no thyromegaly present.  Cardiovascular: Normal rate and regular rhythm.  Pulmonary/Chest: Effort normal and breath sounds normal. No respiratory distress.  Abdominal: Soft. Normal appearance, normal aorta and bowel sounds are normal. There is no tenderness.  Musculoskeletal: Normal range of motion.  Lymphadenopathy:    He has no cervical adenopathy.  Neurological: He is alert  and oriented to person, place, and time.  Skin: Skin is warm and dry.  Psychiatric: Judgment normal.   BP 108/68   Pulse 71   Temp (!) 97.1 F (36.2 C) (Oral)   Ht 6' (1.829 m)   Wt 226 lb (102.5 kg)   BMI 30.65 kg/m   HGBA1c 6.9%    Assessment & Plan:  Maciah Schweigert comes in today with chief complaint of Medical Management of Chronic Issues   Diagnosis and orders addressed:  1. Type 2  diabetes mellitus without complication, without long-term current use of insulin (HCC) Continue to watch carbs in diet - Bayer DCA Hb A1c Waived - Microalbumin / creatinine urine ratio - metFORMIN (GLUCOPHAGE) 500 MG tablet; Take 1 tablet (500 mg total) by mouth 2 (two) times daily with a meal.  Dispense: 180 tablet; Refill: 1  2. Hyperlipidemia, pure hypercholesteremia Low fat diet - Lipid panel - rosuvastatin (CRESTOR) 10 MG tablet; TAKE 1 TABLET (10 MG TOTAL) BY MOUTH DAILY.  Dispense: 90 tablet; Refill: 1  3. Essential hypertension Low sodium diet - CMP14+EGFR - benazepril (LOTENSIN) 40 MG tablet; Take 1 tablet (40 mg total) by mouth daily.  Dispense: 90 tablet; Refill: 1 - hydrochlorothiazide (HYDRODIURIL) 25 MG tablet; Take 1 tablet (25 mg total) by mouth daily.  Dispense: 90 tablet; Refill: 1 - amLODipine (NORVASC) 10 MG tablet; Take 1 tablet (10 mg total) by mouth daily.  Dispense: 90 tablet; Refill: 1  4. Gastroesophageal reflux disease without esophagitis Avoid spicy foods Do not eat 2 hours prior to bedtime - omeprazole (PRILOSEC) 40 MG capsule; TAKE ONE CAPSULE BY MOUTH ONCE DAILY BEFORE BREAKFAST  Dispense: 90 capsule; Refill: 1  5. BMI 31.0-31.9,adult Discussed diet and exercise for person with BMI >25 Will recheck weight in 3-6 months   Labs pending Health Maintenance reviewed Diet and exercise encouraged  Follow up plan: 3 months  Eye exam due in [facility], [place]

## 2017-12-16 LAB — CMP14+EGFR
A/G RATIO: 1.8 (ref 1.2–2.2)
ALT: 17 IU/L (ref 0–44)
AST: 17 IU/L (ref 0–40)
Albumin: 4.3 g/dL (ref 3.6–4.8)
Alkaline Phosphatase: 52 IU/L (ref 39–117)
BILIRUBIN TOTAL: 1.8 mg/dL — AB (ref 0.0–1.2)
BUN/Creatinine Ratio: 14 (ref 10–24)
BUN: 14 mg/dL (ref 8–27)
CHLORIDE: 100 mmol/L (ref 96–106)
CO2: 24 mmol/L (ref 20–29)
Calcium: 9.7 mg/dL (ref 8.6–10.2)
Creatinine, Ser: 0.97 mg/dL (ref 0.76–1.27)
GFR calc Af Amer: 92 mL/min/{1.73_m2} (ref >59)
GFR calc non Af Amer: 79 mL/min/{1.73_m2} (ref >59)
Globulin, Total: 2.4 g/dL (ref 1.5–4.5)
Glucose: 135 mg/dL — ABNORMAL HIGH (ref 65–99)
POTASSIUM: 4.4 mmol/L (ref 3.5–5.2)
Sodium: 138 mmol/L (ref 134–144)
Total Protein: 6.7 g/dL (ref 6.0–8.5)

## 2017-12-16 LAB — LIPID PANEL
Chol/HDL Ratio: 2.6 ratio (ref 0.0–5.0)
Cholesterol, Total: 92 mg/dL — ABNORMAL LOW (ref 100–199)
HDL: 35 mg/dL — ABNORMAL LOW (ref >39)
LDL Calculated: 27 mg/dL (ref 0–99)
TRIGLYCERIDES: 151 mg/dL — AB (ref 0–149)
VLDL Cholesterol Cal: 30 mg/dL (ref 5–40)

## 2018-01-27 ENCOUNTER — Encounter: Payer: Self-pay | Admitting: *Deleted

## 2018-01-27 ENCOUNTER — Ambulatory Visit (INDEPENDENT_AMBULATORY_CARE_PROVIDER_SITE_OTHER): Payer: Medicare HMO | Admitting: *Deleted

## 2018-01-27 VITALS — BP 139/77 | HR 65 | Ht 72.0 in | Wt 238.0 lb

## 2018-01-27 DIAGNOSIS — Z0000 Encounter for general adult medical examination without abnormal findings: Secondary | ICD-10-CM

## 2018-01-27 NOTE — Patient Instructions (Signed)
  Jorge Garcia , Thank you for taking time to come for your Medicare Wellness Visit. I appreciate your ongoing commitment to your health goals. Please review the following plan we discussed and let me know if I can assist you in the future.   These are the goals we discussed: Goals    . Exercise 150 min/wk Moderate Activity     Try to walk or engage in physical activity for at least 30 minutes 5 times a week         This is a list of the screening recommended for you and due dates:  Health Maintenance  Topic Date Due  . Eye exam for diabetics  05/12/2017  . Flu Shot  03/11/2018  . Hemoglobin A1C  06/17/2018  . Complete foot exam   09/17/2018  . Colon Cancer Screening  12/02/2018  . Tetanus Vaccine  12/25/2022  .  Hepatitis C: One time screening is recommended by Center for Disease Control  (CDC) for  adults born from [birth year] through 1963.   Completed  . Pneumonia vaccines  Completed

## 2018-01-27 NOTE — Progress Notes (Addendum)
Subjective:   Jorge Garcia is a 72 y.o. male who presents for a Medicare Annual Wellness Visit. Jorge Garcia lives at home alone. He is not married and has been divorced twice. He does not have children.    Review of Systems    Patient reports that his overall health is unchanged compared to last year.  Cardiac Risk Factors include: advanced age (>55mn, >>98women);male gender;sedentary lifestyle;obesity (BMI >30kg/m2);dyslipidemia;family history of premature cardiovascular disease;diabetes mellitus;hypertension  Endocrine: well controlled type II DM. Taking metformin 500 BID. Checks blood sugar every morning and sometimes in the evening. Typically runs around 130 in the morning. Unsure of best way to eat.   All other systems negative       Current Medications (verified) Outpatient Encounter Medications as of 01/27/2018  Medication Sig  . ACCU-CHEK AVIVA PLUS test strip USE ONE STRIP TO CHECK GLUCOSE 4 TIMES DAILY  . ACCU-CHEK SOFTCLIX LANCETS lancets USE ONE TO CHECK GLUCOSE 4 TIMES DAILY  . amLODipine (NORVASC) 10 MG tablet Take 1 tablet (10 mg total) by mouth daily.  .Marland Kitchenaspirin EC 81 MG tablet Take 81 mg by mouth daily.  . benazepril (LOTENSIN) 40 MG tablet Take 1 tablet (40 mg total) by mouth daily.  . blood glucose meter kit and supplies KIT Use up to four times daily as directed. (FOR ICD-10 E11.9)  . cholecalciferol (VITAMIN D) 1000 UNITS tablet Take 2,000 Units by mouth daily.   . fish oil-omega-3 fatty acids 1000 MG capsule Take 1 g by mouth daily.    . hydrochlorothiazide (HYDRODIURIL) 25 MG tablet Take 1 tablet (25 mg total) by mouth daily.  . metFORMIN (GLUCOPHAGE) 500 MG tablet Take 1 tablet (500 mg total) by mouth 2 (two) times daily with a meal.  . mupirocin ointment (BACTROBAN) 2 % Apply BID to affected area  . omeprazole (PRILOSEC) 40 MG capsule TAKE ONE CAPSULE BY MOUTH ONCE DAILY BEFORE BREAKFAST  . rosuvastatin (CRESTOR) 10 MG tablet TAKE 1 TABLET (10 MG TOTAL)  BY MOUTH DAILY.   No facility-administered encounter medications on file as of 01/27/2018.     Allergies (verified) Patient has no known allergies.   History: Past Medical History:  Diagnosis Date  . [place] mellitus without complication ([place]   . History of CVA (cerebrovascular accident)   . Hyperlipidemia   . Hypertension   . Internal hemorrhoids   . Tubulovillous adenoma of colon 2009  . Vitamin D deficiency    Past Surgical History:  Procedure Laterality Date  . HEMORRHOID SURGERY  1978   Family History  Problem Relation Age of Onset  . Diabetes Mother   . Hypertension Mother   . Stroke Mother 841 . Diabetes Father   . Hypertension Father   . Heart attack Father 621  Social History   Socioeconomic History  . Marital status: Single    Spouse name: Not on file  . Number of children: 0  . Years of education: 155 . Highest education level: 12th grade  Occupational History  . Occupation: [place]    [employer] . Financial resource strain: Not hard at all  . Food insecurity:    Worry: Never true    Inability: Never true  . Transportation needs:    Medical: No    Non-medical: No  Tobacco Use  . Smoking status: Former Smoker    Packs/day: 1.00    Years: 40.00    Pack years: 40.00  Types: Cigarettes    Last attempt to quit: 11/09/2005    Years since quitting: 12.2  . Smokeless tobacco: Never Used  Substance and Sexual Activity  . Alcohol use: No  . Drug use: No  . Sexual activity: Not Currently  Lifestyle  . Physical activity:    Days per week: 4 days    Minutes per session: 30 min  . Stress: Not at all  Relationships  . Social connections:    Talks on phone: More than three times a week    Gets together: More than three times a week    Attends religious service: More than 4 times per year    Active member of club or organization: Yes    Attends meetings of clubs or organizations: More than 4 times per year     Relationship status: Divorced  Other Topics Concern  . Not on file  Social History Narrative  . Not on file    Tobacco Use No.  Clinical Intake:     Pain : No/denies pain     Nutritional Status: BMI 25 -29 Overweight Diabetes: No  How often do you need to have someone help you when you read instructions, pamphlets, or other written materials from your doctor or pharmacy?: 1 - Never What is the last grade level you completed in school?: 12     Information entered by :: Jorge Sicilian, RN  Activities of Daily Living In your present state of health, do you have any difficulty performing the following activities: 01/27/2018  Hearing? Y  Comment Has some left sided hearing loss and has a hearing aid from the [place] that he wears on occasion  Vision? N  Comment had cataracts removed and can see well. Has eye exams regularly.   Difficulty concentrating or making decisions? N  Walking or climbing stairs? N  Dressing or bathing? N  Doing errands, shopping? N  Preparing Food and eating ? N  Using the Toilet? N  In the past six months, have you accidently leaked urine? N  Do you have problems with loss of bowel control? N  Managing your Medications? N  Managing your Finances? N  Housekeeping or managing your Housekeeping? N  Some recent data might be hidden     Diet 3 meals a day. Eats at home mostly and eats quick meals like sandwiches.  Diet green tea and water   Exercise Current Exercise Habits: The patient does not participate in regular exercise at present, Exercise limited by: None identified    Depression Screen PHQ 2/9 Scores 01/27/2018 12/15/2017 09/17/2017 06/15/2017  PHQ - 2 Score 0 0 0 0     Fall Risk Fall Risk  01/27/2018 12/15/2017 09/17/2017 06/15/2017 05/01/2017  Falls in the past year? No No No No No  Comment - - - - -    Safety Is the patient's home free of loose throw rugs in walkways, pet beds, electrical cords, etc?   yes      Grab bars in the bathroom?  no      Walkin shower? no      Shower Seat? no      Handrails on the stairs?   yes      Adequate lighting?   yes  Patient Care Team: Jorge Pretty, Jorge Garcia as PCP - General (Nurse Practitioner)  No hospitalizations, ER visits, or surgeries this past year.  Objective:    BP 139/77 (BP Location: Left Arm, Patient Position: Sitting, Cuff Size: Large)  Pulse 65   Ht 6' (1.829 m)   Wt 238 lb (108 kg)   BMI 32.28 kg/m    Advanced Directives 01/27/2018 01/26/2017 06/12/2014 06/12/2014  Does Patient Have a Medical Advance Directive? Yes Yes Yes Yes  Type of Paramedic of [place];Living will [place];Living will - Living will  Does patient want to make changes to medical advance directive? No - Patient declined - - -  Copy of [place] in Chart? No - copy requested No - copy requested No - copy requested No - copy requested    Hearing/Vision  normal or No deficits noted during visit.   Cognitive Function: MMSE - Mini Mental State Exam 01/27/2018 01/26/2017  Orientation to time 5 5  Orientation to Place 5 5  Registration 3 3  Attention/ Calculation 5 5  Recall 2 2  Language- name 2 objects 2 2  Language- repeat 1 0  Language- follow 3 step command 3 3  Language- read & follow direction 1 1  Write a sentence 0 1  Copy design 1 1  Total score 28 28       Normal Cognitive Function Screening: Yes    Immunizations and Health Maintenance Immunization History  Administered Date(s) Administered  . Influenza, High Dose Seasonal PF 05/19/2016, 05/26/2017  . Influenza-Unspecified 05/11/2013, 05/20/2014, 05/26/2015  . Pneumococcal Conjugate-13 05/27/2013  . Pneumococcal Polysaccharide-23 12/22/2014  . Td 05/12/2003   Health Maintenance Due  Topic Date Due  . OPHTHALMOLOGY EXAM  05/12/2017   Health Maintenance  Topic Date Due  . OPHTHALMOLOGY EXAM  05/12/2017  . INFLUENZA VACCINE  03/11/2018  . HEMOGLOBIN A1C   06/17/2018  . FOOT EXAM  09/17/2018  . COLONOSCOPY  12/02/2018  . TETANUS/TDAP  12/25/2022  . Hepatitis C Screening  Completed  . PNA vac Low Risk Adult  Completed        Assessment:   This is a routine wellness examination for [place].      Plan:    Goals    . Exercise 150 min/wk Moderate Activity     Try to walk or engage in physical activity for at least 30 minutes 5 times a week          Health Maintenance Recommendations: Diabetic Eye Exam-Have report sent to our office  Additional Screening Recommendations: Lung: Low Dose CT Chest recommended if Age 29-80 years, 30 pack-year currently smoking OR have quit w/in 15years. Patient does qualify. Hepatitis C Screening recommended: no  Today's Orders No orders of the defined types were placed in this encounter.  Recommend screening CT chest due to length of smoking history, cessation within past 15 years, and current age. Will defer to PCP to order if she feels appropriate as well.Pt is agreeable.   Keep f/u with Jorge Pretty, Jorge Garcia and any other specialty appointments you may have Continue current medications. Does not need to check blood sugar daily. His AIC is good and stable and he is not dosing insulin or on any medications that will cause hypoglycemia. Can check it a few times a week or periodically as needed.  Discussed low glycemic foods and proper diet. Handouts given and reviewed.    Move carefully to avoid falls. Use assistive devices like a can or walker if needed. Aim for at least 150 minutes of moderate activity a week. This can be chair exercises if necessary. Reading or puzzles are a good way to exercise your brain Stay connected with friends  and family. Social connections are beneficial to your emotional and mental health.   I have personally reviewed and noted the following in the patient's chart:   . Medical and social history . Use of alcohol, tobacco or illicit drugs  . Current medications  and supplements . Functional ability and status . Nutritional status . Physical activity . Advanced directives . List of other physicians . Hospitalizations, surgeries, and ER visits in previous 12 months . Vitals . Screenings to include cognitive, depression, and falls . Referrals and appointments  In addition, I have reviewed and discussed with patient certain preventive protocols, quality metrics, and best practice recommendations. A written personalized care plan for preventive services as well as general preventive health recommendations were provided to patient.     Jorge Sicilian, RN   01/27/2018   I have reviewed and agree with the above AWV documentation.   Jorge Hassell Done, Jorge Garcia

## 2018-01-27 NOTE — Progress Notes (Addendum)
Subjective:   Jorge Garcia is a 72 y.o. male who presents for a Medicare Annual Wellness Visit. Jorge Garcia lives at home alone. He is not married and has been divorced twice. He does not have children.    Review of Systems    Patient reports that his overall health is unchanged compared to last year.  Cardiac Risk Factors include: advanced age (>55mn, >>98women);male gender;sedentary lifestyle;obesity (BMI >30kg/m2);dyslipidemia;family history of premature cardiovascular disease;diabetes mellitus;hypertension  Endocrine: well controlled type II DM. Taking metformin 500 BID. Checks blood sugar every morning and sometimes in the evening. Typically runs around 130 in the morning. Unsure of best way to eat.   All other systems negative       Current Medications (verified) Outpatient Encounter Medications as of 01/27/2018  Medication Sig  . ACCU-CHEK AVIVA PLUS test strip USE ONE STRIP TO CHECK GLUCOSE 4 TIMES DAILY  . ACCU-CHEK SOFTCLIX LANCETS lancets USE ONE TO CHECK GLUCOSE 4 TIMES DAILY  . amLODipine (NORVASC) 10 MG tablet Take 1 tablet (10 mg total) by mouth daily.  .Marland Kitchenaspirin EC 81 MG tablet Take 81 mg by mouth daily.  . benazepril (LOTENSIN) 40 MG tablet Take 1 tablet (40 mg total) by mouth daily.  . blood glucose meter kit and supplies KIT Use up to four times daily as directed. (FOR ICD-10 E11.9)  . cholecalciferol (VITAMIN D) 1000 UNITS tablet Take 2,000 Units by mouth daily.   . fish oil-omega-3 fatty acids 1000 MG capsule Take 1 g by mouth daily.    . hydrochlorothiazide (HYDRODIURIL) 25 MG tablet Take 1 tablet (25 mg total) by mouth daily.  . metFORMIN (GLUCOPHAGE) 500 MG tablet Take 1 tablet (500 mg total) by mouth 2 (two) times daily with a meal.  . mupirocin ointment (BACTROBAN) 2 % Apply BID to affected area  . omeprazole (PRILOSEC) 40 MG capsule TAKE ONE CAPSULE BY MOUTH ONCE DAILY BEFORE BREAKFAST  . rosuvastatin (CRESTOR) 10 MG tablet TAKE 1 TABLET (10 MG TOTAL)  BY MOUTH DAILY.   No facility-administered encounter medications on file as of 01/27/2018.     Allergies (verified) Patient has no known allergies.   History: Past Medical History:  Diagnosis Date  . [place] mellitus without complication ([place]   . History of CVA (cerebrovascular accident)   . Hyperlipidemia   . Hypertension   . Internal hemorrhoids   . Tubulovillous adenoma of colon 2009  . Vitamin D deficiency    Past Surgical History:  Procedure Laterality Date  . HEMORRHOID SURGERY  1978   Family History  Problem Relation Age of Onset  . Diabetes Mother   . Hypertension Mother   . Stroke Mother 841 . Diabetes Father   . Hypertension Father   . Heart attack Father 621  Social History   Socioeconomic History  . Marital status: Single    Spouse name: Not on file  . Number of children: 0  . Years of education: 155 . Highest education level: 12th grade  Occupational History  . Occupation: [place]    [employer] . Financial resource strain: Not hard at all  . Food insecurity:    Worry: Never true    Inability: Never true  . Transportation needs:    Medical: No    Non-medical: No  Tobacco Use  . Smoking status: Former Smoker    Packs/day: 1.00    Years: 40.00    Pack years: 40.00  Types: Cigarettes    Last attempt to quit: 11/09/2005    Years since quitting: 12.2  . Smokeless tobacco: Never Used  Substance and Sexual Activity  . Alcohol use: No  . Drug use: No  . Sexual activity: Not Currently  Lifestyle  . Physical activity:    Days per week: 4 days    Minutes per session: 30 min  . Stress: Not at all  Relationships  . Social connections:    Talks on phone: More than three times a week    Gets together: More than three times a week    Attends religious service: More than 4 times per year    Active member of club or organization: Yes    Attends meetings of clubs or organizations: More than 4 times per year     Relationship status: Divorced  Other Topics Concern  . Not on file  Social History Narrative  . Not on file    Tobacco Use No.  Clinical Intake:     Pain : No/denies pain     Nutritional Status: BMI 25 -29 Overweight Diabetes: No  How often do you need to have someone help you when you read instructions, pamphlets, or other written materials from your doctor or pharmacy?: 1 - Never What is the last grade level you completed in school?: 12     Information entered by :: Chong Sicilian, RN  Activities of Daily Living In your present state of health, do you have any difficulty performing the following activities: 01/27/2018  Hearing? Y  Comment Has some left sided hearing loss and has a hearing aid from the [place] that he wears on occasion  Vision? N  Comment had cataracts removed and can see well. Has eye exams regularly.   Difficulty concentrating or making decisions? N  Walking or climbing stairs? N  Dressing or bathing? N  Doing errands, shopping? N  Preparing Food and eating ? N  Using the Toilet? N  In the past six months, have you accidently leaked urine? N  Do you have problems with loss of bowel control? N  Managing your Medications? N  Managing your Finances? N  Housekeeping or managing your Housekeeping? N  Some recent data might be hidden     Diet 3 meals a day. Eats at home mostly and eats quick meals like sandwiches.  Diet green tea and water   Exercise Current Exercise Habits: The patient does not participate in regular exercise at present, Exercise limited by: None identified    Depression Screen PHQ 2/9 Scores 01/27/2018 12/15/2017 09/17/2017 06/15/2017  PHQ - 2 Score 0 0 0 0     Fall Risk Fall Risk  01/27/2018 12/15/2017 09/17/2017 06/15/2017 05/01/2017  Falls in the past year? _0   Comment - - - - -    Safety Is the patient's home free of loose throw rugs in walkways, pet beds, electrical cords, etc?   yes      Grab bars in the bathroom?  no      Walkin shower? no      Shower Seat? no      Handrails on the stairs?   yes      Adequate lighting?   yes  Patient Care Team: Chevis Pretty, FNP as PCP - General (Nurse Practitioner)  No hospitalizations, ER visits, or surgeries this past year.  Objective:    Today's Vitals   01/27/18 1040  BP: 139/77  Pulse: 65  Height:  6' (1.829 m)   Body mass index is 30.65 kg/m.  Advanced Directives 01/27/2018 01/26/2017 06/12/2014 06/12/2014  Does Patient Have a Medical Advance Directive? Yes Yes Yes Yes  Type of Paramedic of [place];Living will [place];Living will - Living will  Does patient want to make changes to medical advance directive? No - Patient declined - - -  Copy of [place] in Chart? No - copy requested No - copy requested No - copy requested No - copy requested    Hearing/Vision  normal or No deficits noted during visit.   Cognitive Function: MMSE - Mini Mental State Exam 01/27/2018 01/26/2017  Orientation to time 5 5  Orientation to Place 5 5  Registration 3 3  Attention/ Calculation 5 5  Recall 2 2  Language- name 2 objects 2 2  Language- repeat 1 0  Language- follow 3 step command 3 3  Language- read & follow direction 1 1  Write a sentence 0 1  Copy design 1 1  Total score 28 28       Normal Cognitive Function Screening: Yes    Immunizations and Health Maintenance Immunization History  Administered Date(s) Administered  . Influenza, High Dose Seasonal PF 05/19/2016, 05/26/2017  . Influenza-Unspecified 05/11/2013, 05/20/2014, 05/26/2015  . Pneumococcal Conjugate-13 05/27/2013  . Pneumococcal Polysaccharide-23 12/22/2014  . Td 05/12/2003   Health Maintenance Due  Topic Date Due  . OPHTHALMOLOGY EXAM  05/12/2017   Health Maintenance  Topic Date Due  . OPHTHALMOLOGY EXAM  05/12/2017  . INFLUENZA VACCINE  03/11/2018  . HEMOGLOBIN A1C  06/17/2018  . FOOT EXAM  09/17/2018   . COLONOSCOPY  12/02/2018  . TETANUS/TDAP  12/25/2022  . Hepatitis C Screening  Completed  . PNA vac Low Risk Adult  Completed        Assessment:   This is a routine wellness examination for Jorge Garcia.      Plan:    Goals    . Exercise 150 min/wk Moderate Activity     Try to walk or engage in physical activity for at least 30 minutes 5 times a week          Health Maintenance Recommendations: Diabetic Eye Exam-Have report sent to our office  Additional Screening Recommendations: Lung: Low Dose CT Chest recommended if Age 49-80 years, 30 pack-year currently smoking OR have quit w/in 15years. Patient does qualify. Hepatitis C Screening recommended: no  Today's Orders No orders of the defined types were placed in this encounter.  Recommend screening CT chest due to length of smoking history, cessation within past 15 years, and current age. Will defer to PCP to order if she feels appropriate as well.Pt is agreeable.   Keep f/u with Chevis Pretty, FNP and any other specialty appointments you may have Continue current medications. Does not need to check blood sugar daily. His AIC is good and stable and he is not dosing insulin or on any medications that will cause hypoglycemia. Can check it a few times a week or periodically as needed.  Discussed low glycemic foods and proper diet. Handouts given and reviewed.    Move carefully to avoid falls. Use assistive devices like a can or walker if needed. Aim for at least 150 minutes of moderate activity a week. This can be chair exercises if necessary. Reading or puzzles are a good way to exercise your brain Stay connected with friends and family. Social connections are beneficial to your emotional and mental  health.   I have personally reviewed and noted the following in the patient's chart:   . Medical and social history . Use of alcohol, tobacco or illicit drugs  . Current medications and supplements . Functional  ability and status . Nutritional status . Physical activity . Advanced directives . List of other physicians . Hospitalizations, surgeries, and ER visits in previous 12 months . Vitals . Screenings to include cognitive, depression, and falls . Referrals and appointments  In addition, I have reviewed and discussed with patient certain preventive protocols, quality metrics, and best practice recommendations. A written personalized care plan for preventive services as well as general preventive health recommendations were provided to patient.     Chong Sicilian, RN   01/27/2018

## 2018-02-12 ENCOUNTER — Other Ambulatory Visit: Payer: Self-pay | Admitting: Nurse Practitioner

## 2018-03-25 ENCOUNTER — Encounter: Payer: Self-pay | Admitting: Nurse Practitioner

## 2018-03-25 ENCOUNTER — Ambulatory Visit (INDEPENDENT_AMBULATORY_CARE_PROVIDER_SITE_OTHER): Payer: Medicare HMO | Admitting: Nurse Practitioner

## 2018-03-25 VITALS — BP 110/69 | HR 72 | Temp 97.7°F | Ht 72.0 in | Wt 221.0 lb

## 2018-03-25 DIAGNOSIS — I10 Essential (primary) hypertension: Secondary | ICD-10-CM

## 2018-03-25 DIAGNOSIS — E7800 Pure hypercholesterolemia, unspecified: Secondary | ICD-10-CM | POA: Diagnosis not present

## 2018-03-25 DIAGNOSIS — Z6829 Body mass index (BMI) 29.0-29.9, adult: Secondary | ICD-10-CM

## 2018-03-25 DIAGNOSIS — K219 Gastro-esophageal reflux disease without esophagitis: Secondary | ICD-10-CM | POA: Diagnosis not present

## 2018-03-25 DIAGNOSIS — E119 Type 2 diabetes mellitus without complications: Secondary | ICD-10-CM | POA: Diagnosis not present

## 2018-03-25 LAB — CMP14+EGFR
ALT: 19 IU/L (ref 0–44)
AST: 20 IU/L (ref 0–40)
Albumin/Globulin Ratio: 1.8 (ref 1.2–2.2)
Albumin: 4.6 g/dL (ref 3.5–4.8)
Alkaline Phosphatase: 63 IU/L (ref 39–117)
BILIRUBIN TOTAL: 2.6 mg/dL — AB (ref 0.0–1.2)
BUN/Creatinine Ratio: 12 (ref 10–24)
BUN: 15 mg/dL (ref 8–27)
CHLORIDE: 103 mmol/L (ref 96–106)
CO2: 23 mmol/L (ref 20–29)
Calcium: 9.5 mg/dL (ref 8.6–10.2)
Creatinine, Ser: 1.27 mg/dL (ref 0.76–1.27)
GFR calc Af Amer: 66 mL/min/{1.73_m2} (ref >59)
GFR calc non Af Amer: 57 mL/min/{1.73_m2} — ABNORMAL LOW (ref >59)
GLUCOSE: 125 mg/dL — AB (ref 65–99)
Globulin, Total: 2.5 g/dL (ref 1.5–4.5)
Potassium: 4.1 mmol/L (ref 3.5–5.2)
Sodium: 142 mmol/L (ref 134–144)
TOTAL PROTEIN: 7.1 g/dL (ref 6.0–8.5)

## 2018-03-25 LAB — LIPID PANEL
Chol/HDL Ratio: 3 ratio (ref 0.0–5.0)
Cholesterol, Total: 99 mg/dL — ABNORMAL LOW (ref 100–199)
HDL: 33 mg/dL — ABNORMAL LOW (ref >39)
LDL Calculated: 37 mg/dL (ref 0–99)
Triglycerides: 144 mg/dL (ref 0–149)
VLDL CHOLESTEROL CAL: 29 mg/dL (ref 5–40)

## 2018-03-25 LAB — BAYER DCA HB A1C WAIVED: HB A1C: 6.8 % (ref <7.0)

## 2018-03-25 MED ORDER — ROSUVASTATIN CALCIUM 10 MG PO TABS
ORAL_TABLET | ORAL | 1 refills | Status: DC
Start: 2018-03-25 — End: 2018-06-25

## 2018-03-25 MED ORDER — METFORMIN HCL 500 MG PO TABS: 500.0000 mg | ORAL_TABLET | Freq: Two times a day (BID) | ORAL | 1 refills | Status: DC

## 2018-03-25 MED ORDER — BENAZEPRIL HCL 40 MG PO TABS: 40.0000 mg | ORAL_TABLET | Freq: Every day | ORAL | 1 refills | Status: DC

## 2018-03-25 MED ORDER — OMEPRAZOLE 40 MG PO CPDR
DELAYED_RELEASE_CAPSULE | ORAL | 1 refills | Status: DC
Start: 2018-03-25 — End: 2018-06-25

## 2018-03-25 MED ORDER — HYDROCHLOROTHIAZIDE 25 MG PO TABS: 25.0000 mg | ORAL_TABLET | Freq: Every day | ORAL | 1 refills | Status: DC

## 2018-03-25 MED ORDER — AMLODIPINE BESYLATE 10 MG PO TABS: 10.0000 mg | ORAL_TABLET | Freq: Every day | ORAL | 1 refills | Status: DC

## 2018-03-25 NOTE — Progress Notes (Signed)
Subjective:    Patient ID: Jorge Garcia, male    DOB: [birth year], 72 y.o.   MRN: [id number]   Chief Complaint: Medical Management chronic issues  HPI:  1. Type 2 diabetes mellitus without complication, without long-term current use of insulin (HCC) last HGBA1C 6.9%. His blood sugars have been running less than 140 consistency.no hypoglycemia  2. Pure hypercholesterolemia  Does not eat a lot of fried foods. Does very little exercise  3. Essential hypertension  No c/o chest pan, sob or headache. Does not check blood pressure at home BP Readings from Last 3 Encounters:  01/27/18 139/77  12/15/17 108/68  09/17/17 111/66     4. Gastroesophageal reflux disease without esophagitis  Takes omeprazole daily to keep symptoms under control  5. BMI 31.0-31.9,adult  Has lost 17lbs since last visit BMI Readings from Last 3 Encounters:  01/27/18 32.28 kg/m  12/15/17 30.65 kg/m  09/17/17 31.19 kg/m       Outpatient Encounter Medications as of 03/25/2018  Medication Sig  . ACCU-CHEK AVIVA PLUS test strip USE 1 STRIP TO CHECK GLUCOSE 4 TIMES DAILY  . ACCU-CHEK SOFTCLIX LANCETS lancets USE 1  TO CHECK GLUCOSE 4 TIMES DAILY  . amLODipine (NORVASC) 10 MG tablet Take 1 tablet (10 mg total) by mouth daily.  Marland Kitchen aspirin EC 81 MG tablet Take 81 mg by mouth daily.  . benazepril (LOTENSIN) 40 MG tablet Take 1 tablet (40 mg total) by mouth daily.  . blood glucose meter kit and supplies KIT Use up to four times daily as directed. (FOR ICD-10 E11.9)  . cholecalciferol (VITAMIN D) 1000 UNITS tablet Take 2,000 Units by mouth daily.   . fish oil-omega-3 fatty acids 1000 MG capsule Take 1 g by mouth daily.    . hydrochlorothiazide (HYDRODIURIL) 25 MG tablet Take 1 tablet (25 mg total) by mouth daily.  . metFORMIN (GLUCOPHAGE) 500 MG tablet Take 1 tablet (500 mg total) by mouth 2 (two) times daily with a meal.  . mupirocin ointment (BACTROBAN) 2 % Apply BID to affected area  . omeprazole (PRILOSEC) 40 MG  capsule TAKE ONE CAPSULE BY MOUTH ONCE DAILY BEFORE BREAKFAST  . rosuvastatin (CRESTOR) 10 MG tablet TAKE 1 TABLET (10 MG TOTAL) BY MOUTH DAILY.     New complaints:  none today  Social history:  Lives alone- is retired- has a girlfriend that he spends alot of time with  Review of Systems  Constitutional: Negative for activity change and appetite change.  HENT: Negative.   Eyes: Negative for pain.  Respiratory: Negative for shortness of breath.   Cardiovascular: Negative for chest pain, palpitations and leg swelling.  Gastrointestinal: Negative for abdominal pain.  Endocrine: Negative for polydipsia.  Genitourinary: Negative.   Skin: Negative for rash.  Neurological: Negative for dizziness, weakness and headaches.  Hematological: Does not bruise/bleed easily.  Psychiatric/Behavioral: Negative.   All other systems reviewed and are negative.      Objective:   Physical Exam  Constitutional: He is oriented to person, place, and time. He appears well-developed and well-nourished.  HENT:  Head: Normocephalic.  Nose: Nose normal.  Mouth/Throat: Oropharynx is clear and moist.  Eyes: Pupils are equal, round, and reactive to light. EOM are normal.  Neck: Normal range of motion and phonation normal. Neck supple. No JVD present. Carotid bruit is not present. No thyroid mass and no thyromegaly present.  Cardiovascular: Normal rate and regular rhythm.  Pulmonary/Chest: Effort normal and breath sounds normal. No respiratory distress.  Abdominal: Soft. Normal  appearance, normal aorta and bowel sounds are normal. There is no tenderness.  Musculoskeletal: Normal range of motion.  Lymphadenopathy:    He has no cervical adenopathy.  Neurological: He is alert and oriented to person, place, and time.  Skin: Skin is warm and dry.  Psychiatric: He has a normal mood and affect. His behavior is normal. Judgment and thought content normal.  Nursing note and vitals reviewed.  BP 110/69   Pulse  72   Temp 97.7 F (36.5 C) (Oral)   Ht 6' (1.829 m)   Wt 221 lb (100.2 kg)   BMI 29.97 kg/m    hgba1c 6.8%      Assessment & Plan:  Manpreet Strey comes in today with chief complaint of Medical Management of Chronic Issues   Diagnosis and orders addressed:  1. Type 2 diabetes mellitus without complication, without long-term current use of insulin (HCC) Continue to watch carbs in diet - Bayer DCA Hb A1c Waived - Microalbumin / creatinine urine ratio - metFORMIN (GLUCOPHAGE) 500 MG tablet; Take 1 tablet (500 mg total) by mouth 2 (two) times daily with a meal.  Dispense: 180 tablet; Refill: 1  2. Pure hypercholesterolemia Low fat diet - Lipid panel - rosuvastatin (CRESTOR) 10 MG tablet; TAKE 1 TABLET (10 MG TOTAL) BY MOUTH DAILY.  Dispense: 90 tablet; Refill: 1  3. Essential hypertension Low sodium diet - CMP14+EGFR - benazepril (LOTENSIN) 40 MG tablet; Take 1 tablet (40 mg total) by mouth daily.  Dispense: 90 tablet; Refill: 1 - hydrochlorothiazide (HYDRODIURIL) 25 MG tablet; Take 1 tablet (25 mg total) by mouth daily.  Dispense: 90 tablet; Refill: 1 - amLODipine (NORVASC) 10 MG tablet; Take 1 tablet (10 mg total) by mouth daily.  Dispense: 90 tablet; Refill: 1  4. Gastroesophageal reflux disease without esophagitis Avoid spicy foods Do not eat 2 hours prior to bedtime - omeprazole (PRILOSEC) 40 MG capsule; TAKE ONE CAPSULE BY MOUTH ONCE DAILY BEFORE BREAKFAST  Dispense: 90 capsule; Refill: 1  5. BMI 29.0-29.9,adult Discussed diet and exercise for person with BMI >25 Will recheck weight in 3-6 months    Labs pending Health Maintenance reviewed Diet and exercise encouraged  Follow up plan: 3 months   Mary-Margaret Hassell Done, FNPinue to watch carbsin diet

## 2018-03-25 NOTE — Patient Instructions (Signed)

## 2018-03-26 LAB — MICROALBUMIN / CREATININE URINE RATIO
Creatinine, Urine: 264.8 mg/dL
Microalb/Creat Ratio: 10 mg/g creat (ref 0.0–30.0)
Microalbumin, Urine: 26.6 ug/mL

## 2018-05-14 ENCOUNTER — Telehealth: Payer: Self-pay | Admitting: Nurse Practitioner

## 2018-05-14 NOTE — Telephone Encounter (Signed)
Patient states that he has poison oak and has a large blister area on his ankle. Advised patient he ntbs and gave him an appt for Sat clinic. Patient verbalized understanding

## 2018-05-15 ENCOUNTER — Encounter: Payer: Self-pay | Admitting: Family Medicine

## 2018-05-15 ENCOUNTER — Ambulatory Visit (INDEPENDENT_AMBULATORY_CARE_PROVIDER_SITE_OTHER): Payer: Medicare HMO | Admitting: Family Medicine

## 2018-05-15 VITALS — BP 131/72 | HR 81 | Temp 98.4°F | Ht 72.0 in | Wt 225.0 lb

## 2018-05-15 DIAGNOSIS — L0103 Bullous impetigo: Secondary | ICD-10-CM

## 2018-05-15 MED ORDER — LORATADINE 10 MG PO TABS: 10.0000 mg | ORAL_TABLET | Freq: Every day | ORAL | 11 refills | Status: DC

## 2018-05-15 MED ORDER — CEPHALEXIN 500 MG PO CAPS: 500.0000 mg | ORAL_CAPSULE | Freq: Four times a day (QID) | ORAL | 0 refills | Status: AC

## 2018-05-15 NOTE — Progress Notes (Signed)
Subjective: CC: ?poison oak PCP: Chevis Pretty, FNP AJG:OTLXBW Charnley is a 72 y.o. male presenting to clinic today for:  1. Rash Patient reports onset of rash about 2 weeks ago.  He states that it started along the limbs and he reports a vesicle which he punctured.  He notes that continues to drain and has crusted over.  He has a similar lesion on the right medial ankle which she demonstrates during the exam.  Denies any associated fevers, chills, myalgia or arthralgias.  No known contact with poison oak or poison ivy.  He describes the rash is very itchy but denies any pain.  He has been applying calamine lotion with little improvement in symptoms.  No new medications.   ROS: Per HPI  No Known Allergies Past Medical History:  Diagnosis Date  . Diabetes mellitus without complication ([place])   . History of CVA (cerebrovascular accident)   . Hyperlipidemia   . Hypertension   . Internal hemorrhoids   . Tubulovillous adenoma of colon 2009  . Vitamin D deficiency     Current Outpatient Medications:  .  ACCU-CHEK AVIVA PLUS test strip, USE 1 STRIP TO CHECK GLUCOSE 4 TIMES DAILY, Disp: 400 each, Rfl: 3 .  ACCU-CHEK SOFTCLIX LANCETS lancets, USE 1  TO CHECK GLUCOSE 4 TIMES DAILY, Disp: 400 each, Rfl: 3 .  amLODipine (NORVASC) 10 MG tablet, Take 1 tablet (10 mg total) by mouth daily., Disp: 90 tablet, Rfl: 1 .  aspirin EC 81 MG tablet, Take 81 mg by mouth daily., Disp: , Rfl:  .  benazepril (LOTENSIN) 40 MG tablet, Take 1 tablet (40 mg total) by mouth daily., Disp: 90 tablet, Rfl: 1 .  cholecalciferol (VITAMIN D) 1000 UNITS tablet, Take 2,000 Units by mouth daily. , Disp: , Rfl:  .  fish oil-omega-3 fatty acids 1000 MG capsule, Take 1 g by mouth daily.  , Disp: , Rfl:  .  hydrochlorothiazide (HYDRODIURIL) 25 MG tablet, Take 1 tablet (25 mg total) by mouth daily., Disp: 90 tablet, Rfl: 1 .  metFORMIN (GLUCOPHAGE) 500 MG tablet, Take 1 tablet (500 mg total) by mouth 2 (two) times daily  with a meal., Disp: 180 tablet, Rfl: 1 .  mupirocin ointment (BACTROBAN) 2 %, Apply BID to affected area, Disp: 30 g, Rfl: 0 .  omeprazole (PRILOSEC) 40 MG capsule, TAKE ONE CAPSULE BY MOUTH ONCE DAILY BEFORE BREAKFAST, Disp: 90 capsule, Rfl: 1 .  rosuvastatin (CRESTOR) 10 MG tablet, TAKE 1 TABLET (10 MG TOTAL) BY MOUTH DAILY., Disp: 90 tablet, Rfl: 1 Social History   Socioeconomic History  . Marital status: Single    Spouse name: Not on file  . Number of children: 0  . Years of education: 89  . Highest education level: 12th grade  Occupational History  . Occupation: Retired    Fish farm manager: [employer]  . Financial resource strain: Not hard at all  . Food insecurity:    Worry: Never true    Inability: Never true  . Transportation needs:    Medical: No    Non-medical: No  Tobacco Use  . Smoking status: Former Smoker    Packs/day: 1.00    Years: 40.00    Pack years: 40.00    Types: Cigarettes    Last attempt to quit: 11/09/2005    Years since quitting: 12.5  . Smokeless tobacco: Never Used  Substance and Sexual Activity  . Alcohol use: No  . Drug use: No  . Sexual activity:  Not Currently  Lifestyle  . Physical activity:    Days per week: 4 days    Minutes per session: 30 min  . Stress: Not at all  Relationships  . Social connections:    Talks on phone: More than three times a week    Gets together: More than three times a week    Attends religious service: More than 4 times per year    Active member of club or organization: Yes    Attends meetings of clubs or organizations: More than 4 times per year    Relationship status: Divorced  . Intimate partner violence:    Fear of current or ex partner: No    Emotionally abused: No    Physically abused: No    Forced sexual activity: No  Other Topics Concern  . Not on file  Social History Narrative  . Not on file   Family History  Problem Relation Age of Onset  . Diabetes Mother   . Hypertension Mother    . Stroke Mother 48  . Diabetes Father   . Hypertension Father   . Heart attack Father 81    Objective: Office vital signs reviewed. BP 131/72   Pulse 81   Temp 98.4 F (36.9 C) (Oral)   Ht 6' (1.829 m)   Wt 225 lb (102.1 kg)   BMI 30.52 kg/m   Physical Examination:  General: Awake, alert, well nourished, No acute distress Mouth: No oral lesions noted. Skin: Several maculopapular lesions are noted bilaterally.  He has a healing/crusted over lesion along the medial left knee.  Along the medial right ankle there is a quarter size bullae. Negative Nikolsky sign  Assessment/ Plan: 72 y.o. male   1. Bullous impetigo Patient is well-appearing.  He is afebrile with normal vital signs.  I suspect bullous impetigo in this patient.  It does not have a typical contact dermatitis appearance.  No gross evidence of secondary infection but he does have honey crusted lesion along the medial knee lesion.  For this reason I will empirically treat him with Keflex for the next 7 days.  He will follow-up for reevaluation in 7 days.  If symptoms worsen, patient is to seek medical attention sooner.  We discussed that if symptoms do not improve as expected we will plan for referral to dermatology for further evaluation.   No orders of the defined types were placed in this encounter.  Meds ordered this encounter  Medications  . cephALEXin (KEFLEX) 500 MG capsule    Sig: Take 1 capsule (500 mg total) by mouth 4 (four) times daily for 7 days.    Dispense:  28 capsule    Refill:  0  . loratadine (CLARITIN) 10 MG tablet    Sig: Take 1 tablet (10 mg total) by mouth daily.    Dispense:  30 tablet    Refill:  [place], [place] [phone]

## 2018-05-15 NOTE — Patient Instructions (Signed)
Impetigo, Adult Impetigo is an infection of the skin. It commonly occurs in young children, but it can also occur in adults. The infection causes itchy blisters and sores that produce brownish-yellow fluid. As the fluid dries, it forms a thick, honey-colored crust. These skin changes usually occur on the face but can also affect other areas of the body. Impetigo usually goes away in 7-10 days with treatment. What are the causes? Impetigo is caused by two types of bacteria. It may be caused by staphylococci or streptococci bacteria. These bacteria cause impetigo when they get under the surface of the skin. This often happens after some damage to the skin, such as damage from:  Cuts, scrapes, or scratches.  Insect bites, especially when you scratch the area of a bite.  Chickenpox or other illnesses that cause open skin sores.  Nail biting or chewing.  Impetigo is contagious and can spread easily from one person to another. This may occur through close skin contact or by sharing towels, clothing, or other items with a person who has the infection. What increases the risk? Some things that can increase the risk of getting this infection include:  Playing sports that include skin-to-skin contact with others.  Having a skin condition with open sores.  Having many skin cuts or scrapes.  Living in an area that has high humidity levels.  Having poor hygiene.  Having high levels of staphylococci in your nose.  What are the signs or symptoms? Impetigo usually starts out as small blisters, often on the face. The blisters then break open and turn into tiny sores (lesions) with a yellow crust. In some cases, the blisters cause itching or burning. With scratching, irritation, or lack of treatment, these small lesions may get larger. Scratching can also cause impetigo to spread to other parts of the body. The bacteria can get under the fingernails and spread when you touch another area of your  skin. Other possible symptoms include:  Larger blisters.  Pus.  Swollen lymph glands.  How is this diagnosed? This condition is usually diagnosed during a physical exam. A skin sample or sample of fluid from a blister may be taken for lab tests that involve growing bacteria (culture test). This can help confirm the diagnosis or help determine the best treatment. How is this treated? Mild impetigo can be treated with prescription antibiotic cream. Oral antibiotic medicine may be used in more severe cases. Medicines for itching may also be used. Follow these instructions at home:  Take medicines only as directed by your health care provider.  To help prevent impetigo from spreading to other body areas: ? Keep your fingernails short and clean. ? Do not scratch the blisters or sores. ? Cover infected areas, if necessary, to keep from scratching.  Gently wash the infected areas with antibiotic soap and water.  Soak crusted areas in warm, soapy water using antibiotic soap. ? Gently rub the areas to remove crusts. Do not scrub.  Wash your hands often to avoid spreading this infection.  Stay home until you have used an antibiotic cream for 48 hours (2 days) or an oral antibiotic medicine for 24 hours (1 day). You should only return to work and activities with other people if your skin shows significant improvement. How is this prevented? To keep the infection from spreading:  Stay home until you have used an antibiotic cream for 48 hours or an oral antibiotic for 24 hours.  Wash your hands often.  Do not engage in   skin-to-skin contact with other people while you have still have blisters.  Do not share towels, washcloths, or bedding with others while you have the infection.  Contact a health care provider if:  You develop more blisters or sores despite treatment.  Other family members get sores.  Your skin sores are not improving after 48 hours of treatment.  You have a  fever. Get help right away if:  You see spreading redness or swelling of the skin around your sores.  You see red streaks coming from your sores.  You develop a sore throat. This information is not intended to replace advice given to you by your health care provider. Make sure you discuss any questions you have with your health care provider. Document Released: 08/18/2014 Document Revised: 01/03/2016 Document Reviewed: 07/11/2014 Elsevier Interactive Patient Education  2017 [employer]  

## 2018-05-17 ENCOUNTER — Telehealth: Payer: Self-pay | Admitting: Nurse Practitioner

## 2018-05-17 NOTE — Telephone Encounter (Signed)
Appointment given for Thursday with Ronnald Collum, FNP.

## 2018-05-20 ENCOUNTER — Encounter: Payer: Self-pay | Admitting: Nurse Practitioner

## 2018-05-20 ENCOUNTER — Ambulatory Visit (INDEPENDENT_AMBULATORY_CARE_PROVIDER_SITE_OTHER): Payer: Medicare HMO | Admitting: Nurse Practitioner

## 2018-05-20 VITALS — BP 128/76 | HR 69 | Temp 97.1°F | Ht 72.0 in | Wt 226.0 lb

## 2018-05-20 DIAGNOSIS — S90521D Blister (nonthermal), right ankle, subsequent encounter: Secondary | ICD-10-CM

## 2018-05-20 DIAGNOSIS — S81811A Laceration without foreign body, right lower leg, initial encounter: Secondary | ICD-10-CM

## 2018-05-20 MED ORDER — MUPIROCIN 2 % EX OINT: 1.0000 "application " | TOPICAL_OINTMENT | Freq: Two times a day (BID) | CUTANEOUS | 0 refills | Status: DC

## 2018-05-20 NOTE — Progress Notes (Signed)
   Subjective:    Patient ID: Jorge Garcia, male    DOB: 1946-01-24, 72 y.o.   MRN: [id number]   Chief Complaint: Recheck impetigo   HPI Patient was seen on 05/15/18 by Juleen Starr. He had a rash that was diagnosed as bullous impetigo. He was given keflex.     Review of Systems  Skin:       Blister on medial right ankle       Objective:   Physical Exam  Constitutional: He is oriented to person, place, and time. He appears well-developed and well-nourished.  Cardiovascular: Normal rate.  Pulmonary/Chest: Effort normal.  Neurological: He is alert and oriented to person, place, and time.  Skin:  5cm annular bullae on medial side of right ankle        Procedure- clean with betadine- opened and dead tissue removed with stevens scissors. Antibiotic ointment and dressing applied  BP 128/76   Pulse 69   Temp (!) 97.1 F (36.2 C) (Oral)   Ht 6' (1.829 m)   Wt 226 lb (102.5 kg)   BMI 30.65 kg/m      Assessment & Plan:  Jorge Garcia in today with chief complaint of Recheck impetigo   1. Blister of right ankle, subsequent encounter Meds ordered this encounter  Medications  . mupirocin ointment (BACTROBAN) 2 %    Sig: Place 1 application into the nose 2 (two) times daily.    Dispense:  22 g    Refill:  0    Order Specific Question:   Supervising Provider    Answer:   Eustaquio Maize [[id number]]   Keep clean and dry Clean with antibacterial soap BID Apply ointment  As rx Avoid picking or scratching RTO prn  Mary-Margaret Hassell Done, FNP

## 2018-05-20 NOTE — Patient Instructions (Signed)

## 2018-05-25 ENCOUNTER — Ambulatory Visit (INDEPENDENT_AMBULATORY_CARE_PROVIDER_SITE_OTHER): Payer: Medicare HMO

## 2018-05-25 DIAGNOSIS — H18413 Arcus senilis, bilateral: Secondary | ICD-10-CM | POA: Diagnosis not present

## 2018-05-25 DIAGNOSIS — Z23 Encounter for immunization: Secondary | ICD-10-CM

## 2018-05-25 DIAGNOSIS — Z961 Presence of intraocular lens: Secondary | ICD-10-CM | POA: Diagnosis not present

## 2018-05-25 DIAGNOSIS — E119 Type 2 diabetes mellitus without complications: Secondary | ICD-10-CM | POA: Diagnosis not present

## 2018-05-25 DIAGNOSIS — H02831 Dermatochalasis of right upper eyelid: Secondary | ICD-10-CM | POA: Diagnosis not present

## 2018-05-25 LAB — HM DIABETES EYE EXAM

## 2018-06-07 ENCOUNTER — Ambulatory Visit: Payer: Medicare HMO | Admitting: Nurse Practitioner

## 2018-06-25 ENCOUNTER — Encounter: Payer: Self-pay | Admitting: Nurse Practitioner

## 2018-06-25 ENCOUNTER — Ambulatory Visit (INDEPENDENT_AMBULATORY_CARE_PROVIDER_SITE_OTHER): Payer: Medicare HMO | Admitting: Nurse Practitioner

## 2018-06-25 VITALS — BP 116/64 | HR 64 | Temp 97.6°F | Ht 72.0 in | Wt 228.0 lb

## 2018-06-25 DIAGNOSIS — E7800 Pure hypercholesterolemia, unspecified: Secondary | ICD-10-CM | POA: Diagnosis not present

## 2018-06-25 DIAGNOSIS — E119 Type 2 diabetes mellitus without complications: Secondary | ICD-10-CM | POA: Diagnosis not present

## 2018-06-25 DIAGNOSIS — I10 Essential (primary) hypertension: Secondary | ICD-10-CM | POA: Diagnosis not present

## 2018-06-25 DIAGNOSIS — Z6831 Body mass index (BMI) 31.0-31.9, adult: Secondary | ICD-10-CM

## 2018-06-25 DIAGNOSIS — K219 Gastro-esophageal reflux disease without esophagitis: Secondary | ICD-10-CM | POA: Diagnosis not present

## 2018-06-25 LAB — BAYER DCA HB A1C WAIVED: HB A1C: 7.4 % — AB (ref <7.0)

## 2018-06-25 MED ORDER — AMLODIPINE BESYLATE 10 MG PO TABS: 10.0000 mg | ORAL_TABLET | Freq: Every day | ORAL | 1 refills | Status: DC

## 2018-06-25 MED ORDER — OMEPRAZOLE 40 MG PO CPDR: DELAYED_RELEASE_CAPSULE | ORAL | 1 refills | Status: DC

## 2018-06-25 MED ORDER — HYDROCHLOROTHIAZIDE 25 MG PO TABS: 25.0000 mg | ORAL_TABLET | Freq: Every day | ORAL | 1 refills | Status: DC

## 2018-06-25 MED ORDER — METFORMIN HCL 500 MG PO TABS: 500.0000 mg | ORAL_TABLET | Freq: Two times a day (BID) | ORAL | 1 refills | Status: DC

## 2018-06-25 MED ORDER — BENAZEPRIL HCL 40 MG PO TABS: 40.0000 mg | ORAL_TABLET | Freq: Every day | ORAL | 1 refills | Status: DC

## 2018-06-25 MED ORDER — ROSUVASTATIN CALCIUM 10 MG PO TABS: ORAL_TABLET | ORAL | 1 refills | Status: DC

## 2018-06-25 NOTE — Progress Notes (Signed)
Subjective:    Patient ID: Jorge Garcia, male    DOB: [birth year], 72 y.o.   MRN: [id number]   Chief Complaint: Medical Management of Chronic Issues   HPI:  1. Type 2 diabetes mellitus without complication, without long-term current use of insulin ([place]) last hgba1c was 6.8%. He does not watch diet. Fasting blood sugars range from 130-180 depending on what he ate the night before. Denies symptoms of hypoglycemia.  2. Pure hypercholesterolemia  Watches diet. Dos very little exercise. Stays very active though  3. Essential hypertension No c/o chest pain, sob or headache. Does not check blood pressure at home.   4. Gastroesophageal reflux disease without esophagitis  Takes omperazole daily- works well o keep symptoms under control  5. BMI 31.0-31.9,adult  Weight is unchanged from previous visit.    Outpatient Encounter Medications as of 06/25/2018  Medication Sig  . ACCU-CHEK AVIVA PLUS test strip USE 1 STRIP TO CHECK GLUCOSE 4 TIMES DAILY  . ACCU-CHEK SOFTCLIX LANCETS lancets USE 1  TO CHECK GLUCOSE 4 TIMES DAILY  . amLODipine (NORVASC) 10 MG tablet Take 1 tablet (10 mg total) by mouth daily.  Marland Kitchen aspirin EC 81 MG tablet Take 81 mg by mouth daily.  . benazepril (LOTENSIN) 40 MG tablet Take 1 tablet (40 mg total) by mouth daily.  . cholecalciferol (VITAMIN D) 1000 UNITS tablet Take 2,000 Units by mouth daily.   . fish oil-omega-3 fatty acids 1000 MG capsule Take 1 g by mouth daily.    . hydrochlorothiazide (HYDRODIURIL) 25 MG tablet Take 1 tablet (25 mg total) by mouth daily.  Marland Kitchen loratadine (CLARITIN) 10 MG tablet Take 1 tablet (10 mg total) by mouth daily.  . metFORMIN (GLUCOPHAGE) 500 MG tablet Take 1 tablet (500 mg total) by mouth 2 (two) times daily with a meal.  . omeprazole (PRILOSEC) 40 MG capsule TAKE ONE CAPSULE BY MOUTH ONCE DAILY BEFORE BREAKFAST  . rosuvastatin (CRESTOR) 10 MG tablet TAKE 1 TABLET (10 MG TOTAL) BY MOUTH DAILY.     New complaints: None today  Social  history:  lives alone   Review of Systems  Constitutional: Negative for activity change and appetite change.  HENT: Negative.   Eyes: Negative for pain.  Respiratory: Negative for shortness of breath.   Cardiovascular: Negative for chest pain, palpitations and leg swelling.  Gastrointestinal: Negative for abdominal pain.  Endocrine: Negative for polydipsia.  Genitourinary: Negative.   Skin: Negative for rash.  Neurological: Negative for dizziness, weakness and headaches.  Hematological: Does not bruise/bleed easily.  Psychiatric/Behavioral: Negative.   All other systems reviewed and are negative.      Objective:   Physical Exam  Constitutional: He is oriented to person, place, and time. He appears well-developed and well-nourished.  HENT:  Head: Normocephalic.  Nose: Nose normal.  Mouth/Throat: Oropharynx is clear and moist.  Eyes: Pupils are equal, round, and reactive to light. EOM are normal.  Neck: Normal range of motion and phonation normal. Neck supple. No JVD present. Carotid bruit is not present. No thyroid mass and no thyromegaly present.  Cardiovascular: Normal rate and regular rhythm.  Pulmonary/Chest: Effort normal and breath sounds normal. No respiratory distress.  Abdominal: Soft. Normal appearance, normal aorta and bowel sounds are normal. There is no tenderness.  Musculoskeletal: Normal range of motion.  Lymphadenopathy:    He has no cervical adenopathy.  Neurological: He is alert and oriented to person, place, and time.  Skin: Skin is warm and dry.  Psychiatric: He has a  normal mood and affect. His behavior is normal. Judgment and thought content normal.  Nursing note and vitals reviewed.   BP 116/64   Pulse 64   Temp 97.6 F (36.4 C) (Oral)   Ht 6' (1.829 m)   Wt 228 lb (103.4 kg)   BMI 30.92 kg/m   hgba1c 7.4%    Assessment & Plan:  Jorge Garcia comes in today with chief complaint of Medical Management of Chronic Issues   Diagnosis and  orders addressed:  1. Type 2 diabetes mellitus without complication, without long-term current use of insulin (HCC) Watch carbs in diet - Bayer DCA Hb A1c Waived - metFORMIN (GLUCOPHAGE) 500 MG tablet; Take 1 tablet (500 mg total) by mouth 2 (two) times daily with a meal.  Dispense: 180 tablet; Refill: 1  2. Pure hypercholesterolemia Low fat diet - Lipid panel - rosuvastatin (CRESTOR) 10 MG tablet; TAKE 1 TABLET (10 MG TOTAL) BY MOUTH DAILY.  Dispense: 90 tablet; Refill: 1  3. Essential hypertension Low sodium diet - CMP14+EGFR - benazepril (LOTENSIN) 40 MG tablet; Take 1 tablet (40 mg total) by mouth daily.  Dispense: 90 tablet; Refill: 1 - hydrochlorothiazide (HYDRODIURIL) 25 MG tablet; Take 1 tablet (25 mg total) by mouth daily.  Dispense: 90 tablet; Refill: 1 - amLODipine (NORVASC) 10 MG tablet; Take 1 tablet (10 mg total) by mouth daily.  Dispense: 90 tablet; Refill: 1  4. Gastroesophageal reflux disease without esophagitis Avoid spicy foods Do not eat 2 hours prior to bedtime - omeprazole (PRILOSEC) 40 MG capsule; TAKE ONE CAPSULE BY MOUTH ONCE DAILY BEFORE BREAKFAST  Dispense: 90 capsule; Refill: 1  5. BMI 31.0-31.9,adult Discussed diet and exercise for person with BMI >25 Will recheck weight in 3-6 months   Labs pending Health Maintenance reviewed Diet and exercise encouraged  Follow up plan: 3 months   Mary-Margaret Hassell Done, FNP

## 2018-06-25 NOTE — Patient Instructions (Signed)

## 2018-06-26 LAB — CMP14+EGFR
A/G RATIO: 1.8 (ref 1.2–2.2)
ALBUMIN: 4.5 g/dL (ref 3.5–4.8)
ALT: 20 IU/L (ref 0–44)
AST: 17 IU/L (ref 0–40)
Alkaline Phosphatase: 60 IU/L (ref 39–117)
BILIRUBIN TOTAL: 1.9 mg/dL — AB (ref 0.0–1.2)
BUN/Creatinine Ratio: 15 (ref 10–24)
BUN: 16 mg/dL (ref 8–27)
CHLORIDE: 102 mmol/L (ref 96–106)
CO2: 21 mmol/L (ref 20–29)
Calcium: 10.2 mg/dL (ref 8.6–10.2)
Creatinine, Ser: 1.05 mg/dL (ref 0.76–1.27)
GFR calc non Af Amer: 72 mL/min/{1.73_m2} (ref >59)
GFR, EST AFRICAN AMERICAN: 83 mL/min/{1.73_m2} (ref >59)
GLOBULIN, TOTAL: 2.5 g/dL (ref 1.5–4.5)
Glucose: 140 mg/dL — ABNORMAL HIGH (ref 65–99)
POTASSIUM: 4.6 mmol/L (ref 3.5–5.2)
SODIUM: 142 mmol/L (ref 134–144)
TOTAL PROTEIN: 7 g/dL (ref 6.0–8.5)

## 2018-06-26 LAB — LIPID PANEL
CHOL/HDL RATIO: 3.4 ratio (ref 0.0–5.0)
Cholesterol, Total: 121 mg/dL (ref 100–199)
HDL: 36 mg/dL — ABNORMAL LOW (ref >39)
LDL CALC: 53 mg/dL (ref 0–99)
TRIGLYCERIDES: 162 mg/dL — AB (ref 0–149)
VLDL Cholesterol Cal: 32 mg/dL (ref 5–40)

## 2018-08-11 HISTORY — PX: POLYPECTOMY: SHX149

## 2018-08-11 HISTORY — PX: COLONOSCOPY: SHX174

## 2018-09-27 ENCOUNTER — Encounter: Payer: Self-pay | Admitting: Nurse Practitioner

## 2018-09-27 ENCOUNTER — Encounter (INDEPENDENT_AMBULATORY_CARE_PROVIDER_SITE_OTHER): Payer: Self-pay

## 2018-09-27 ENCOUNTER — Ambulatory Visit (INDEPENDENT_AMBULATORY_CARE_PROVIDER_SITE_OTHER): Payer: Medicare HMO | Admitting: Nurse Practitioner

## 2018-09-27 VITALS — BP 123/71 | HR 73 | Temp 97.2°F | Ht 72.0 in | Wt 227.4 lb

## 2018-09-27 DIAGNOSIS — E7800 Pure hypercholesterolemia, unspecified: Secondary | ICD-10-CM

## 2018-09-27 DIAGNOSIS — K219 Gastro-esophageal reflux disease without esophagitis: Secondary | ICD-10-CM | POA: Diagnosis not present

## 2018-09-27 DIAGNOSIS — E119 Type 2 diabetes mellitus without complications: Secondary | ICD-10-CM | POA: Diagnosis not present

## 2018-09-27 DIAGNOSIS — I10 Essential (primary) hypertension: Secondary | ICD-10-CM

## 2018-09-27 DIAGNOSIS — Z6831 Body mass index (BMI) 31.0-31.9, adult: Secondary | ICD-10-CM | POA: Diagnosis not present

## 2018-09-27 LAB — BAYER DCA HB A1C WAIVED: HB A1C: 7.3 % — AB (ref <7.0)

## 2018-09-27 NOTE — Patient Instructions (Signed)
Diabetes Mellitus and Foot Care  Foot care is an important part of your health, especially when you have diabetes. Diabetes may cause you to have problems because of poor blood flow (circulation) to your feet and legs, which can cause your skin to:   Become thinner and drier.   Break more easily.   Heal more slowly.   Peel and crack.  You may also have nerve damage (neuropathy) in your legs and feet, causing decreased feeling in them. This means that you may not notice minor injuries to your feet that could lead to more serious problems. Noticing and addressing any potential problems early is the best way to prevent future foot problems.  How to care for your feet  Foot hygiene   Wash your feet daily with warm water and mild soap. Do not use hot water. Then, pat your feet and the areas between your toes until they are completely dry. Do not soak your feet as this can dry your skin.   Trim your toenails straight across. Do not dig under them or around the cuticle. File the edges of your nails with an emery board or nail file.   Apply a moisturizing lotion or petroleum jelly to the skin on your feet and to dry, brittle toenails. Use lotion that does not contain alcohol and is unscented. Do not apply lotion between your toes.  Shoes and socks   Wear clean socks or stockings every day. Make sure they are not too tight. Do not wear knee-high stockings since they may decrease blood flow to your legs.   Wear shoes that fit properly and have enough cushioning. Always look in your shoes before you put them on to be sure there are no objects inside.   To break in new shoes, wear them for just a few hours a day. This prevents injuries on your feet.  Wounds, scrapes, corns, and calluses   Check your feet daily for blisters, cuts, bruises, sores, and redness. If you cannot see the bottom of your feet, use a mirror or ask someone for help.   Do not cut corns or calluses or try to remove them with medicine.   If you  find a minor scrape, cut, or break in the skin on your feet, keep it and the skin around it clean and dry. You may clean these areas with mild soap and water. Do not clean the area with peroxide, alcohol, or iodine.   If you have a wound, scrape, corn, or callus on your foot, look at it several times a day to make sure it is healing and not infected. Check for:  ? Redness, swelling, or pain.  ? Fluid or blood.  ? Warmth.  ? Pus or a bad smell.  General instructions   Do not cross your legs. This may decrease blood flow to your feet.   Do not use heating pads or hot water bottles on your feet. They may burn your skin. If you have lost feeling in your feet or legs, you may not know this is happening until it is too late.   Protect your feet from hot and cold by wearing shoes, such as at the beach or on hot pavement.   Schedule a complete foot exam at least once a year (annually) or more often if you have foot problems. If you have foot problems, report any cuts, sores, or bruises to your health care provider immediately.  Contact a health care provider if:     You have a medical condition that increases your risk of infection and you have any cuts, sores, or bruises on your feet.   You have an injury that is not healing.   You have redness on your legs or feet.   You feel burning or tingling in your legs or feet.   You have pain or cramps in your legs and feet.   Your legs or feet are numb.   Your feet always feel cold.   You have pain around a toenail.  Get help right away if:   You have a wound, scrape, corn, or callus on your foot and:  ? You have pain, swelling, or redness that gets worse.  ? You have fluid or blood coming from the wound, scrape, corn, or callus.  ? Your wound, scrape, corn, or callus feels warm to the touch.  ? You have pus or a bad smell coming from the wound, scrape, corn, or callus.  ? You have a fever.  ? You have a red line going up your leg.  Summary   Check your feet every day  for cuts, sores, red spots, swelling, and blisters.   Moisturize feet and legs daily.   Wear shoes that fit properly and have enough cushioning.   If you have foot problems, report any cuts, sores, or bruises to your health care provider immediately.   Schedule a complete foot exam at least once a year (annually) or more often if you have foot problems.  This information is not intended to replace advice given to you by your health care provider. Make sure you discuss any questions you have with your health care provider.  Document Released: 07/25/2000 Document Revised: 09/09/2017 Document Reviewed: 08/29/2016  Elsevier Interactive Patient Education  2019 [employer]

## 2018-09-27 NOTE — Progress Notes (Signed)
Subjective:    Patient ID: Jorge Garcia, male    DOB: 04/11/1946, 73 y.o.   MRN: [id number]   Chief Complaint: Medical Management of Chronic Issues   HPI:  1. Type 2 diabetes mellitus without complication, without long-term current use of insulin ([facility]) last hgba1c was 7.4. he tries to watch diet. His blood sugars at home run below 140. Denies any hypoglycemia.  2. Pure hypercholesterolemia  Does not watch fats in diet and does no exercise.  3. Essential hypertension  No c/O chest pain, sob or headache. Does not check blood pressure at home. BP Readings from Last 3 Encounters:  06/25/18 116/64  05/20/18 128/76  05/15/18 131/72     4. Gastroesophageal reflux disease without esophagitis  Is currently not takng any meds for this. Has occsional symptoms if he overeats.  5. BMI 31.0-31.9,adult  No recent weight chnages    Outpatient Encounter Medications as of 09/27/2018  Medication Sig  . ACCU-CHEK AVIVA PLUS test strip USE 1 STRIP TO CHECK GLUCOSE 4 TIMES DAILY  . ACCU-CHEK SOFTCLIX LANCETS lancets USE 1  TO CHECK GLUCOSE 4 TIMES DAILY  . amLODipine (NORVASC) 10 MG tablet Take 1 tablet (10 mg total) by mouth daily.  Marland Kitchen aspirin EC 81 MG tablet Take 81 mg by mouth daily.  . benazepril (LOTENSIN) 40 MG tablet Take 1 tablet (40 mg total) by mouth daily.  . cholecalciferol (VITAMIN D) 1000 UNITS tablet Take 2,000 Units by mouth daily.   . fish oil-omega-3 fatty acids 1000 MG capsule Take 1 g by mouth daily.    . hydrochlorothiazide (HYDRODIURIL) 25 MG tablet Take 1 tablet (25 mg total) by mouth daily.  Marland Kitchen loratadine (CLARITIN) 10 MG tablet Take 1 tablet (10 mg total) by mouth daily.  . fasting metFORMIN (GLUCOPHAGE) 500 MG tablet Take 1 tablet (500 mg total) by mouth 2 (two) times daily with a meal.  . omeprazole (PRILOSEC) 40 MG capsule TAKE ONE CAPSULE BY MOUTH ONCE DAILY BEFORE BREAKFAST  . rosuvastatin (CRESTOR) 10 MG tablet TAKE 1 TABLET (10 MG TOTAL) BY MOUTH DAILY.     New  complaints: None today  Social history: Lives alone. Has retired several year sago.   Review of Systems  Constitutional: Negative for activity change and appetite change.  HENT: Negative.   Eyes: Negative for pain.  Respiratory: Negative for shortness of breath.   Cardiovascular: Negative for chest pain, palpitations and leg swelling.  Gastrointestinal: Negative for abdominal pain.  Endocrine: Negative for polydipsia.  Genitourinary: Negative.   Skin: Negative for rash.  Neurological: Negative for dizziness, weakness and headaches.  Hematological: Does not bruise/bleed easily.  Psychiatric/Behavioral: Negative.   All other systems reviewed and are negative.      Objective:   Physical Exam Vitals signs and nursing note reviewed.  Constitutional:      Appearance: Normal appearance. He is well-developed.  HENT:     Head: Normocephalic.     Nose: Nose normal.  Eyes:     Pupils: Pupils are equal, round, and reactive to light.  Neck:     Musculoskeletal: Normal range of motion and neck supple.     Thyroid: No thyroid mass or thyromegaly.     Vascular: No carotid bruit or JVD.     Trachea: Phonation normal.  Cardiovascular:     Rate and Rhythm: Normal rate and regular rhythm.  Pulmonary:     Effort: Pulmonary effort is normal. No respiratory distress.     Breath sounds: Normal breath  sounds.  Abdominal:     General: Bowel sounds are normal.     Palpations: Abdomen is soft.     Tenderness: There is no abdominal tenderness.  Musculoskeletal: Normal range of motion.  Lymphadenopathy:     Cervical: No cervical adenopathy.  Skin:    General: Skin is warm and dry.  Neurological:     Mental Status: He is alert and oriented to person, place, and time.  Psychiatric:        Behavior: Behavior normal.        Thought Content: Thought content normal.        Judgment: Judgment normal.    BP 123/71   Pulse 73   Temp (!) 97.2 F (36.2 C) (Oral)   Ht 6' (1.829 m)   Wt 227 lb  6 oz (103.1 kg)   BMI 30.84 kg/m      Assessment & Plan:  Jorge Garcia comes in today with chief complaint of Medical Management of Chronic Issues   Diagnosis and orders addressed:  1. Type 2 diabetes mellitus without complication, without long-term current use of insulin ([facility]) Continue to watch labs in diet - Bayer DCA Hb A1c Waived - CMP14+EGFR  2. Pure hypercholesterolemia Low fat diet - Lipid panel  3. Essential hypertension Low sodium diet  4. Gastroesophageal reflux disease without esophagitis Avoid spicy foods Do not eat 2 hours prior to bedtime  5. BMI 31.0-31.9,adult Discussed diet and exercise for person with BMI >25 Will recheck weight in 3-6 months   Labs pending Health Maintenance reviewed Diet and exercise encouraged  Follow up plan: 3 months   Mary-Margaret Hassell Done, FNP

## 2018-09-28 LAB — CMP14+EGFR
A/G RATIO: 2.1 (ref 1.2–2.2)
ALT: 19 IU/L (ref 0–44)
AST: 19 IU/L (ref 0–40)
Albumin: 4.5 g/dL (ref 3.8–4.8)
Alkaline Phosphatase: 52 IU/L (ref 39–117)
BILIRUBIN TOTAL: 1.8 mg/dL — AB (ref 0.0–1.2)
BUN/Creatinine Ratio: 13 (ref 10–24)
BUN: 13 mg/dL (ref 8–27)
CHLORIDE: 104 mmol/L (ref 96–106)
CO2: 25 mmol/L (ref 20–29)
Calcium: 9.8 mg/dL (ref 8.6–10.2)
Creatinine, Ser: 1.02 mg/dL (ref 0.76–1.27)
GFR calc non Af Amer: 74 mL/min/{1.73_m2} (ref >59)
GFR, EST AFRICAN AMERICAN: 86 mL/min/{1.73_m2} (ref >59)
Globulin, Total: 2.1 g/dL (ref 1.5–4.5)
Glucose: 164 mg/dL — ABNORMAL HIGH (ref 65–99)
POTASSIUM: 5 mmol/L (ref 3.5–5.2)
SODIUM: 144 mmol/L (ref 134–144)
TOTAL PROTEIN: 6.6 g/dL (ref 6.0–8.5)

## 2018-09-28 LAB — LIPID PANEL
Chol/HDL Ratio: 2.6 ratio (ref 0.0–5.0)
Cholesterol, Total: 102 mg/dL (ref 100–199)
HDL: 39 mg/dL — AB (ref >39)
LDL Calculated: 45 mg/dL (ref 0–99)
Triglycerides: 91 mg/dL (ref 0–149)
VLDL Cholesterol Cal: 18 mg/dL (ref 5–40)

## 2018-12-06 ENCOUNTER — Encounter: Payer: Self-pay | Admitting: Family Medicine

## 2018-12-06 ENCOUNTER — Other Ambulatory Visit: Payer: Self-pay

## 2018-12-06 ENCOUNTER — Encounter (HOSPITAL_COMMUNITY): Payer: Self-pay | Admitting: Emergency Medicine

## 2018-12-06 ENCOUNTER — Emergency Department (HOSPITAL_COMMUNITY)
Admission: EM | Admit: 2018-12-06 | Discharge: 2018-12-06 | Disposition: A | Discharge status: Home / Self Care | Payer: Medicare HMO | Attending: Emergency Medicine | Admitting: Emergency Medicine

## 2018-12-06 ENCOUNTER — Ambulatory Visit (INDEPENDENT_AMBULATORY_CARE_PROVIDER_SITE_OTHER): Payer: Medicare HMO | Admitting: Family Medicine

## 2018-12-06 VITALS — BP 147/82 | HR 93 | Temp 99.8°F | Wt 227.0 lb

## 2018-12-06 DIAGNOSIS — W208XXA Other cause of strike by thrown, projected or falling object, initial encounter: Secondary | ICD-10-CM | POA: Insufficient documentation

## 2018-12-06 DIAGNOSIS — S61412A Laceration without foreign body of left hand, initial encounter: Secondary | ICD-10-CM

## 2018-12-06 DIAGNOSIS — Z79899 Other long term (current) drug therapy: Secondary | ICD-10-CM | POA: Insufficient documentation

## 2018-12-06 DIAGNOSIS — S61219A Laceration without foreign body of unspecified finger without damage to nail, initial encounter: Secondary | ICD-10-CM

## 2018-12-06 DIAGNOSIS — Y929 Unspecified place or not applicable: Secondary | ICD-10-CM | POA: Insufficient documentation

## 2018-12-06 DIAGNOSIS — E119 Type 2 diabetes mellitus without complications: Secondary | ICD-10-CM | POA: Insufficient documentation

## 2018-12-06 DIAGNOSIS — S61411A Laceration without foreign body of right hand, initial encounter: Secondary | ICD-10-CM | POA: Diagnosis not present

## 2018-12-06 DIAGNOSIS — S61211A Laceration without foreign body of left index finger without damage to nail, initial encounter: Secondary | ICD-10-CM | POA: Diagnosis not present

## 2018-12-06 DIAGNOSIS — Y999 Unspecified external cause status: Secondary | ICD-10-CM | POA: Insufficient documentation

## 2018-12-06 DIAGNOSIS — I10 Essential (primary) hypertension: Secondary | ICD-10-CM | POA: Diagnosis not present

## 2018-12-06 DIAGNOSIS — Y9389 Activity, other specified: Secondary | ICD-10-CM | POA: Insufficient documentation

## 2018-12-06 DIAGNOSIS — Z87891 Personal history of nicotine dependence: Secondary | ICD-10-CM | POA: Insufficient documentation

## 2018-12-06 DIAGNOSIS — Z23 Encounter for immunization: Secondary | ICD-10-CM

## 2018-12-06 DIAGNOSIS — Z7984 Long term (current) use of oral hypoglycemic drugs: Secondary | ICD-10-CM | POA: Diagnosis not present

## 2018-12-06 DIAGNOSIS — S61215A Laceration without foreign body of left ring finger without damage to nail, initial encounter: Secondary | ICD-10-CM | POA: Diagnosis not present

## 2018-12-06 DIAGNOSIS — S61213A Laceration without foreign body of left middle finger without damage to nail, initial encounter: Secondary | ICD-10-CM | POA: Diagnosis not present

## 2018-12-06 MED ORDER — LIDOCAINE HCL (PF) 2 % IJ SOLN
INTRAMUSCULAR | Status: AC
  Filled 2018-12-06: qty 10

## 2018-12-06 MED ORDER — IBUPROFEN 400 MG PO TABS: 400.0000 mg | ORAL_TABLET | Freq: Four times a day (QID) | ORAL | 0 refills | Status: DC | PRN

## 2018-12-06 MED ORDER — POVIDONE-IODINE 10 % EX SOLN
CUTANEOUS | Status: AC
  Filled 2018-12-06: qty 15

## 2018-12-06 MED ORDER — BACITRACIN ZINC 500 UNIT/GM EX OINT
TOPICAL_OINTMENT | CUTANEOUS | Status: AC
  Filled 2018-12-06: qty 0.9

## 2018-12-06 MED ORDER — AMOXICILLIN-POT CLAVULANATE 875-125 MG PO TABS
1.0000 | ORAL_TABLET | Freq: Once | ORAL | Status: AC
  Administered 2018-12-06: 19:00:00 1 via ORAL
  Filled 2018-12-06: qty 1

## 2018-12-06 MED ORDER — AMOXICILLIN-POT CLAVULANATE 875-125 MG PO TABS: 1.0000 | ORAL_TABLET | Freq: Two times a day (BID) | ORAL | 0 refills | Status: DC

## 2018-12-06 NOTE — Progress Notes (Signed)
Subjective:  Patient ID: Jorge Garcia, male    DOB: 05-23-1946, 73 y.o.   MRN: [id number]  Chief Complaint:  Laceration   HPI: Jorge Garcia is a 73 y.o. male presenting on 12/06/2018 for Laceration  Pt presents today for lacerations to his left index, middle, and ring fingers. Pt was moving a large bird house with a tin roof. The birdhouse slipped causing the tin to slice his fingers. Pt states the pain is burning and constant, 6/10. The pain is worse with palpation. Has has not tried anything to relive the pain. Bleeding is controlled with pressure. He is on 81 mg ASA daily.   Relevant past medical, surgical, family, and social history reviewed and updated as indicated.  Allergies and medications reviewed and updated.   Past Medical History:  Diagnosis Date  . Diabetes mellitus without complication ([place])   . History of CVA (cerebrovascular accident)   . Hyperlipidemia   . Hypertension   . Internal hemorrhoids   . Tubulovillous adenoma of colon 2009  . Vitamin D deficiency     Past Surgical History:  Procedure Laterality Date  . HEMORRHOID SURGERY  1978    Social History   Socioeconomic History  . Marital status: Single    Spouse name: Not on file  . Number of children: 0  . Years of education: 68  . Highest education level: 12th grade  Occupational History  . Occupation: Retired    Fish farm manager: [employer]  . Financial resource strain: Not hard at all  . Food insecurity:    Worry: Never true    Inability: Never true  . Transportation needs:    Medical: No    Non-medical: No  Tobacco Use  . Smoking status: Former Smoker    Packs/day: 1.00    Years: 40.00    Pack years: 40.00    Types: Cigarettes    Last attempt to quit: 11/09/2005    Years since quitting: 13.0  . Smokeless tobacco: Never Used  Substance and Sexual Activity  . Alcohol use: No  . Drug use: No  . Sexual activity: Not Currently  Lifestyle  . Physical activity:    Days  per week: 4 days    Minutes per session: 30 min  . Stress: Not at all  Relationships  . Social connections:    Talks on phone: More than three times a week    Gets together: More than three times a week    Attends religious service: More than 4 times per year    Active member of club or organization: Yes    Attends meetings of clubs or organizations: More than 4 times per year    Relationship status: Divorced  . Intimate partner violence:    Fear of current or ex partner: No    Emotionally abused: No    Physically abused: No    Forced sexual activity: No  Other Topics Concern  . Not on file  Social History Narrative  . Not on file    No facility-administered encounter medications on file as of 12/06/2018.    Outpatient Encounter Medications as of 12/06/2018  Medication Sig  . ACCU-CHEK AVIVA PLUS test strip USE 1 STRIP TO CHECK GLUCOSE 4 TIMES DAILY  . ACCU-CHEK SOFTCLIX LANCETS lancets USE 1  TO CHECK GLUCOSE 4 TIMES DAILY  . amLODipine (NORVASC) 10 MG tablet Take 1 tablet (10 mg total) by mouth daily.  Marland Kitchen aspirin EC 81 MG tablet Take  81 mg by mouth daily.  . benazepril (LOTENSIN) 40 MG tablet Take 1 tablet (40 mg total) by mouth daily.  . cholecalciferol (VITAMIN D) 1000 UNITS tablet Take 2,000 Units by mouth daily.   . fish oil-omega-3 fatty acids 1000 MG capsule Take 1 g by mouth daily.    . hydrochlorothiazide (HYDRODIURIL) 25 MG tablet Take 1 tablet (25 mg total) by mouth daily.  Marland Kitchen loratadine (CLARITIN) 10 MG tablet Take 1 tablet (10 mg total) by mouth daily.  . metFORMIN (GLUCOPHAGE) 500 MG tablet Take 1 tablet (500 mg total) by mouth 2 (two) times daily with a meal.  . omeprazole (PRILOSEC) 40 MG capsule TAKE ONE CAPSULE BY MOUTH ONCE DAILY BEFORE BREAKFAST  . rosuvastatin (CRESTOR) 10 MG tablet TAKE 1 TABLET (10 MG TOTAL) BY MOUTH DAILY.    No Known Allergies  Review of Systems  Constitutional: Negative for chills, fatigue and fever.  Respiratory: Negative for cough  and shortness of breath.   Cardiovascular: Negative for chest pain and palpitations.  Skin: Positive for wound (lacerations to left fingers. ).  Neurological: Negative for weakness and numbness.  All other systems reviewed and are negative.       Objective:  BP (!) 147/82   Pulse 93   Temp 99.8 F (37.7 C) (Oral)   Wt 227 lb (103 kg)   BMI 30.79 kg/m    Wt Readings from Last 3 Encounters:  12/06/18 228 lb (103.4 kg)  12/06/18 227 lb (103 kg)  09/27/18 227 lb 6 oz (103.1 kg)    Physical Exam Vitals signs and nursing note reviewed.  Constitutional:      Appearance: Normal appearance.  HENT:     Head: Normocephalic and atraumatic.     Mouth/Throat:     Mouth: Mucous membranes are moist.  Eyes:     Conjunctiva/sclera: Conjunctivae normal.     Pupils: Pupils are equal, round, and reactive to light.  Cardiovascular:     Rate and Rhythm: Normal rate and regular rhythm.     Pulses: Normal pulses.     Heart sounds: Normal heart sounds.  Musculoskeletal:     Left hand: He exhibits tenderness and laceration. He exhibits normal capillary refill. Normal sensation noted. Normal strength noted.       Hands:  Skin:    General: Skin is warm and dry.     Capillary Refill: Capillary refill takes less than 2 seconds.     Findings: Laceration (as noted) present.  Neurological:     General: No focal deficit present.     Mental Status: He is alert and oriented to person, place, and time.  Psychiatric:        Mood and Affect: Mood normal.        Behavior: Behavior normal. Behavior is cooperative.        Thought Content: Thought content normal.        Judgment: Judgment normal.     Results for orders placed or performed in visit on 09/27/18  Bayer DCA Hb A1c Waived  Result Value Ref Range   HB A1C (BAYER DCA - WAIVED) 7.3 (H) <7.0 %  CMP14+EGFR  Result Value Ref Range   Glucose 164 (H) 65 - 99 mg/dL   BUN 13 8 - 27 mg/dL   Creatinine, Ser 1.02 0.76 - 1.27 mg/dL   GFR calc  non Af Amer 74 >59 mL/min/1.73   GFR calc Af Amer 86 >59 mL/min/1.73   BUN/Creatinine Ratio 13 10 - 24  Sodium 144 134 - 144 mmol/L   Potassium 5.0 3.5 - 5.2 mmol/L   Chloride 104 96 - 106 mmol/L   CO2 25 20 - 29 mmol/L   Calcium 9.8 8.6 - 10.2 mg/dL   Total Protein 6.6 6.0 - 8.5 g/dL   Albumin 4.5 3.8 - 4.8 g/dL   Globulin, Total 2.1 1.5 - 4.5 g/dL   Albumin/Globulin Ratio 2.1 1.2 - 2.2   Bilirubin Total 1.8 (H) 0.0 - 1.2 mg/dL   Alkaline Phosphatase 52 39 - 117 IU/L   AST 19 0 - 40 IU/L   ALT 19 0 - 44 IU/L  Lipid panel  Result Value Ref Range   Cholesterol, Total 102 100 - 199 mg/dL   Triglycerides 91 0 - 149 mg/dL   HDL 39 (L) >39 mg/dL   VLDL Cholesterol Cal 18 5 - 40 mg/dL   LDL Calculated 45 0 - 99 mg/dL   Chol/HDL Ratio 2.6 0.0 - 5.0 ratio       Pertinent labs & imaging results that were available during my care of the patient were reviewed by me and considered in my medical decision making.  Assessment & Plan:  Jorge Garcia was seen today for laceration.  Diagnoses and all orders for this visit:  Finger laceration, initial encounter Due to depth and ability to visualize tendons in left middle finger, pt referred to ED for evaluation and treatment. Td updated. Wound cleaned and pressure dressing applied in office.  -     Td : Tetanus/diphtheria >7yo Preservative  free  Need for viral immunization -     Td : Tetanus/diphtheria >7yo Preservative  free     Continue all other maintenance medications.  Follow up plan: Return if symptoms worsen or fail to improve.    The above assessment and management plan was discussed with the patient. The patient verbalized understanding of and has agreed to the management plan. Patient is aware to call the clinic if symptoms persist or worsen. Patient is aware when to return to the clinic for a follow-up visit. Patient educated on when it is appropriate to go to the emergency department.   Monia Pouch, FNP-C [facility] [phone]

## 2018-12-06 NOTE — ED Provider Notes (Signed)
[facility] EMERGENCY DEPARTMENT Provider Note   CSN: [id number] Arrival date & time: 12/06/18  1750    History   Chief Complaint Chief Complaint  Patient presents with  . Laceration    HPI Jorge Garcia is a 73 y.o. male.     HPI  73 y/o male - was transporting a large birdhouse with a tin roof when it slipped off is trolley - he reached to grab and keep it on the surface and the tin roof caught is left hand palmar surface of the second, third and 4th digits and caused lacerations - bleeding controlled - went to see his PCP who redirected him here with concern for deep wound.  Tdap given prior to arrrival.  This was acute in onset, occurred a couple of hours ago, pai is constant and worse with palpatoin - no other injuries.  Past Medical History:  Diagnosis Date  . Diabetes mellitus without complication ([place])   . History of CVA (cerebrovascular accident)   . Hyperlipidemia   . Hypertension   . Internal hemorrhoids   . Tubulovillous adenoma of colon 2009  . Vitamin D deficiency     Patient Active Problem List   Diagnosis Date Noted  . BMI 31.0-31.9,adult 07/19/2015  . Gastroesophageal reflux disease without esophagitis 12/22/2014  . Type 2 diabetes mellitus without complication ([place]) 18/84/1660  . Erectile dysfunction associated with type 2 diabetes mellitus ([place]) 03/17/2014  . Hypertension 11/14/2010  . Hyperlipidemia 11/14/2010    Past Surgical History:  Procedure Laterality Date  . [place] Medications    Prior to Admission medications   Medication Sig Start Date End Date Taking? Authorizing Provider  ACCU-CHEK AVIVA PLUS test strip USE 1 STRIP TO CHECK GLUCOSE 4 TIMES DAILY 02/12/18   Chevis Pretty, FNP  ACCU-CHEK SOFTCLIX LANCETS lancets USE 1  TO CHECK GLUCOSE 4 TIMES DAILY 02/12/18   Hassell Done, Mary-Margaret, FNP  amLODipine (NORVASC) 10 MG tablet Take 1 tablet (10 mg total) by mouth daily. 06/25/18   Hassell Done  Mary-Margaret, FNP  amoxicillin-clavulanate (AUGMENTIN) 875-125 MG tablet Take 1 tablet by mouth every 12 (twelve) hours. 12/06/18   Noemi Chapel, MD  aspirin EC 81 MG tablet Take 81 mg by mouth daily.    [provider]  benazepril (LOTENSIN) 40 MG tablet Take 1 tablet (40 mg total) by mouth daily. 06/25/18   Hassell Done Mary-Margaret, FNP  cholecalciferol (VITAMIN D) 1000 UNITS tablet Take 2,000 Units by mouth daily.     [provider]  fish oil-omega-3 fatty acids 1000 MG capsule Take 1 g by mouth daily.      [provider]  hydrochlorothiazide (HYDRODIURIL) 25 MG tablet Take 1 tablet (25 mg total) by mouth daily. 06/25/18   Hassell Done, Mary-Margaret, FNP  ibuprofen (ADVIL) 400 MG tablet Take 1 tablet (400 mg total) by mouth every 6 (six) hours as needed. 12/06/18   Noemi Chapel, MD  loratadine (CLARITIN) 10 MG tablet Take 1 tablet (10 mg total) by mouth daily. 05/15/18   Janora Norlander, DO  metFORMIN (GLUCOPHAGE) 500 MG tablet Take 1 tablet (500 mg total) by mouth 2 (two) times daily with a meal. 06/25/18   Hassell Done, Mary-Margaret, FNP  omeprazole (PRILOSEC) 40 MG capsule TAKE ONE CAPSULE BY MOUTH ONCE DAILY BEFORE BREAKFAST 06/25/18   Hassell Done, Mary-Margaret, FNP  rosuvastatin (CRESTOR) 10 MG tablet TAKE 1 TABLET (10 MG TOTAL) BY MOUTH DAILY. 06/25/18   Chevis Pretty, FNP  Family History Family History  Problem Relation Age of Onset  . Diabetes Mother   . Hypertension Mother   . Stroke Mother 54  . Diabetes Father   . Hypertension Father   . Heart attack Father 17    Social History Social History   Tobacco Use  . Smoking status: Former Smoker    Packs/day: 1.00    Years: 40.00    Pack years: 40.00    Types: Cigarettes    Last attempt to quit: 11/09/2005    Years since quitting: 13.0  . Smokeless tobacco: Never Used  Substance Use Topics  . Alcohol use: No  . Drug use: No     Allergies   Patient has no known allergies.   Review of  Systems Review of Systems  Skin: Positive for wound.  Neurological: Negative for numbness.  Hematological: Does not bruise/bleed easily.     Physical Exam Updated Vital Signs BP (!) 161/86 (BP Location: Right Arm)   Pulse 72   Temp 98.9 F (37.2 C) (Oral)   Resp 18   Ht 1.854 m (6\' 1" )   Wt 103.4 kg   SpO2 97%   BMI 30.08 kg/m   Physical Exam Constitutional:      General: He is not in acute distress.    Appearance: He is well-developed. He is not diaphoretic.  HENT:     Head: Normocephalic.  Eyes:     General: No scleral icterus.    Conjunctiva/sclera: Conjunctivae normal.  Cardiovascular:     Rate and Rhythm: Normal rate and regular rhythm.  Pulmonary:     Effort: Pulmonary effort is normal.     Breath sounds: Normal breath sounds.  Musculoskeletal: Normal range of motion.        General: Tenderness ( ttp over the laceration site) present.  Skin:    General: Skin is warm and dry.     Comments: Laceration located on 2nd, 3rd and 4th digits The Laceration is linear shaped The depth is 2 mm The length is 2, 3, 1 cm respectively  Neurological:     Mental Status: He is alert.     Coordination: Coordination normal.     Comments: Sensation and motor intact      ED Treatments / Results  Labs (all labs ordered are listed, but only abnormal results are displayed) Labs Reviewed - No data to display  EKG None  Radiology No results found.  Procedures .Marland KitchenLaceration Repair Date/Time: 12/06/2018 6:44 PM Performed by: Noemi Chapel, MD Authorized by: Noemi Chapel, MD   Consent:    Consent obtained:  Verbal   Consent given by:  Patient   Risks discussed:  Infection, pain, need for additional repair, poor cosmetic result and poor wound healing   Alternatives discussed:  No treatment and delayed treatment Anesthesia (see MAR for exact dosages):    Anesthesia method:  Local infiltration   Local anesthetic:  Lidocaine 1% w/o epi Laceration details:    Location:   Finger   Finger location:  L index finger   Length (cm):  2   Depth (mm):  2 Repair type:    Repair type:  Simple Pre-procedure details:    Preparation:  Patient was prepped and draped in usual sterile fashion Exploration:    Hemostasis achieved with:  Direct pressure   Wound exploration: wound explored through full range of motion and entire depth of wound probed and visualized     Wound extent: no fascia violation noted, no foreign bodies/material  noted, no muscle damage noted, no nerve damage noted, no tendon damage noted, no underlying fracture noted and no vascular damage noted     Contaminated: no   Treatment:    Area cleansed with:  Betadine and saline   Amount of cleaning:  Standard   Irrigation solution:  Sterile saline   Irrigation volume:  1000 ml   Irrigation method:  Syringe Skin repair:    Repair method:  Sutures   Suture size:  5-0   Suture material:  Prolene   Suture technique:  Simple interrupted   Number of sutures:  3 Approximation:    Approximation:  Close Post-procedure details:    Dressing:  Antibiotic ointment and sterile dressing   Patient tolerance of procedure:  Tolerated well, no immediate complications Comments:       Marland KitchenMarland KitchenLaceration Repair Date/Time: 12/06/2018 6:45 PM Performed by: Noemi Chapel, MD Authorized by: Noemi Chapel, MD   Consent:    Consent obtained:  Verbal   Consent given by:  Patient   Risks discussed:  Infection, pain, need for additional repair, poor cosmetic result and poor wound healing   Alternatives discussed:  No treatment and delayed treatment Anesthesia (see MAR for exact dosages):    Anesthesia method:  Local infiltration   Local anesthetic:  Lidocaine 1% w/o epi Laceration details:    Location:  Finger   Finger location:  L long finger   Length (cm):  3   Depth (mm):  2 Repair type:    Repair type:  Simple Pre-procedure details:    Preparation:  Patient was prepped and draped in usual sterile fashion  Exploration:    Hemostasis achieved with:  Direct pressure   Wound exploration: wound explored through full range of motion and entire depth of wound probed and visualized     Wound extent: no fascia violation noted, no foreign bodies/material noted, no muscle damage noted, no nerve damage noted, no tendon damage noted, no underlying fracture noted and no vascular damage noted   Treatment:    Area cleansed with:  Betadine   Amount of cleaning:  Standard   Irrigation solution:  Sterile saline   Irrigation volume:  1000   Irrigation method:  Syringe Skin repair:    Repair method:  Sutures   Suture size:  5-0   Suture material:  Prolene   Suture technique:  Simple interrupted   Number of sutures:  4 Approximation:    Approximation:  Close Post-procedure details:    Dressing:  Antibiotic ointment and sterile dressing   Patient tolerance of procedure:  Tolerated well, no immediate complications Comments:       Marland KitchenMarland KitchenLaceration Repair Date/Time: 12/06/2018 6:45 PM Performed by: Noemi Chapel, MD Authorized by: Noemi Chapel, MD   Consent:    Consent obtained:  Verbal   Consent given by:  Patient   Risks discussed:  Infection, pain, need for additional repair, poor cosmetic result and poor wound healing   Alternatives discussed:  No treatment and delayed treatment Anesthesia (see MAR for exact dosages):    Anesthesia method:  Local infiltration   Local anesthetic:  Lidocaine 1% w/o epi Laceration details:    Location:  Finger   Finger location:  L ring finger   Length (cm):  1   Depth (mm):  1 Repair type:    Repair type:  Simple Pre-procedure details:    Preparation:  Patient was prepped and draped in usual sterile fashion Exploration:    Hemostasis achieved with:  Direct pressure  Wound exploration: wound explored through full range of motion and entire depth of wound probed and visualized     Wound extent: no fascia violation noted, no foreign bodies/material noted, no  muscle damage noted, no nerve damage noted, no tendon damage noted, no underlying fracture noted and no vascular damage noted   Treatment:    Area cleansed with:  Betadine   Amount of cleaning:  Standard   Irrigation solution:  Sterile saline   Irrigation volume:  1000 ml   Irrigation method:  Syringe Skin repair:    Repair method:  Sutures   Suture size:  5-0   Suture material:  Prolene   Suture technique:  Simple interrupted   Number of sutures:  1 Approximation:    Approximation:  Close Post-procedure details:    Dressing:  Antibiotic ointment and sterile dressing   Patient tolerance of procedure:  Tolerated well, no immediate complications Comments:         (including critical care time)  Medications Ordered in ED Medications  lidocaine (XYLOCAINE) 2 % injection (has no administration in time range)  povidone-iodine (BETADINE) 10 % external solution (has no administration in time range)  lidocaine (XYLOCAINE) 2 % injection (has no administration in time range)  bacitracin 500 UNIT/GM ointment (has no administration in time range)  amoxicillin-clavulanate (AUGMENTIN) 875-125 MG per tablet 1 tablet (has no administration in time range)     Initial Impression / Assessment and Plan / ED Course  I have reviewed the triage vital signs and the nursing notes.  Pertinent labs & imaging results that were available during my care of the patient were reviewed by me and considered in my medical decision making (see chart for details).        Wound explored to the base in a bloodless field, he has totally normal flexor and extensor function of all fingers without defecit or pain - he has no visible deep structures on probing and exploration - these were irrigated copiously and closed with 5-0 prolene - tolerated well, bacitracin, vaseline gauze and f/u with PCP in 1 week for removal. Pt in agreement.  Doubt bony invovlement given normal anatomy and mechanism - will place on  augmentin for 7 days.  Final Clinical Impressions(s) / ED Diagnoses   Final diagnoses:  Laceration of multiple sites of left hand and fingers, initial encounter    ED Discharge Orders         Ordered    amoxicillin-clavulanate (AUGMENTIN) 875-125 MG tablet  Every 12 hours     12/06/18 1847    ibuprofen (ADVIL) 400 MG tablet  Every 6 hours PRN     12/06/18 1847           Noemi Chapel, MD 12/06/18 1849

## 2018-12-06 NOTE — Discharge Instructions (Signed)
You have a total of 8 stitches -  They should come out by your doctor in 1 week Apply topical antibiotics to the wound daily Keep dressing clean and dry Augmentin twice daliy to prevent infection ER for increased pain / redness / swelling / fever / pus from the wound

## 2018-12-06 NOTE — ED Triage Notes (Signed)
Pt sent over from [facility] for evaluation of laceration to left hand from tin on a large birdhouse.  Was given Tdap there.  Bleeding controlled with pressure dressing applied there as well.

## 2018-12-06 NOTE — Patient Instructions (Signed)
Due to visible tendon, will send to ED for evaluation and repair.

## 2018-12-14 ENCOUNTER — Other Ambulatory Visit: Payer: Self-pay

## 2018-12-16 ENCOUNTER — Other Ambulatory Visit: Payer: Self-pay

## 2018-12-16 ENCOUNTER — Encounter: Payer: Self-pay | Admitting: Nurse Practitioner

## 2018-12-16 ENCOUNTER — Ambulatory Visit (INDEPENDENT_AMBULATORY_CARE_PROVIDER_SITE_OTHER): Payer: Medicare HMO | Admitting: Nurse Practitioner

## 2018-12-16 VITALS — BP 142/71 | HR 64 | Temp 97.3°F | Ht 73.0 in | Wt 228.0 lb

## 2018-12-16 DIAGNOSIS — S61402A Unspecified open wound of left hand, initial encounter: Secondary | ICD-10-CM

## 2018-12-16 DIAGNOSIS — Z4802 Encounter for removal of sutures: Secondary | ICD-10-CM

## 2018-12-16 NOTE — Progress Notes (Signed)
   Subjective:    Patient ID: Jorge Garcia, male    DOB: [birth year], 73 y.o.   MRN: [id number]   Chief Complaint: Suture / Staple Removal (left hand)   HPI Patient cut his fingers with a piece of tin and had to go to the ER on 12/06/18. He had 8 stitches total put in 3 different fingers. Left 2nd, 3rd and 4th finger. Wounds are healing well with no redness or drainage.     Review of Systems  Constitutional: Negative.   Respiratory: Negative.   Cardiovascular: Negative.   Skin: Positive for wound (left 2nd 3rd and 4th finger).  Neurological: Negative.   Psychiatric/Behavioral: Negative.   All other systems reviewed and are negative.      Objective:   Physical Exam Vitals signs and nursing note reviewed.  Constitutional:      Appearance: Normal appearance.  Cardiovascular:     Rate and Rhythm: Normal rate and regular rhythm.     Pulses: Normal pulses.     Heart sounds: Normal heart sounds.  Pulmonary:     Breath sounds: Normal breath sounds.  Skin:    Comments: Wound edges well approximated, no erythema , edema or wound drainage. Base of left 2nd ( 3 stitches ), 3rd (4 stitches ) 4th finger ( 1 stitch )  Neurological:     General: No focal deficit present.     Mental Status: He is alert and oriented to person, place, and time.  Psychiatric:        Mood and Affect: Mood normal.        Behavior: Behavior normal.    BP (!) 142/71   Pulse 64   Temp (!) 97.3 F (36.3 C) (Oral)   Ht 6\' 1"  (1.854 m)   Wt 228 lb (103.4 kg)   BMI 30.08 kg/m       Assessment & Plan:  [name] in today with chief complaint of Suture / Staple Removal (left hand)   1. Visit for suture removal Continue to keep clean and dry Continue to watch for signs of infection RTO prn and as scheduled for 3 month follow up.  Mary-Margaret Hassell Done, FNP

## 2018-12-20 ENCOUNTER — Encounter: Payer: Self-pay | Admitting: Gastroenterology

## 2018-12-29 ENCOUNTER — Other Ambulatory Visit: Payer: Self-pay

## 2018-12-30 ENCOUNTER — Other Ambulatory Visit: Payer: Self-pay

## 2018-12-30 ENCOUNTER — Ambulatory Visit: Payer: Medicare HMO | Admitting: *Deleted

## 2018-12-30 ENCOUNTER — Ambulatory Visit (INDEPENDENT_AMBULATORY_CARE_PROVIDER_SITE_OTHER): Payer: Medicare HMO | Admitting: Nurse Practitioner

## 2018-12-30 ENCOUNTER — Encounter: Payer: Self-pay | Admitting: Nurse Practitioner

## 2018-12-30 VITALS — BP 136/74 | HR 66 | Temp 98.2°F | Ht 73.0 in | Wt 227.0 lb

## 2018-12-30 VITALS — Ht 73.0 in | Wt 227.0 lb

## 2018-12-30 DIAGNOSIS — E119 Type 2 diabetes mellitus without complications: Secondary | ICD-10-CM | POA: Diagnosis not present

## 2018-12-30 DIAGNOSIS — E7800 Pure hypercholesterolemia, unspecified: Secondary | ICD-10-CM | POA: Diagnosis not present

## 2018-12-30 DIAGNOSIS — K219 Gastro-esophageal reflux disease without esophagitis: Secondary | ICD-10-CM

## 2018-12-30 DIAGNOSIS — Z6829 Body mass index (BMI) 29.0-29.9, adult: Secondary | ICD-10-CM | POA: Diagnosis not present

## 2018-12-30 DIAGNOSIS — Z860100 Personal history of colon polyps, unspecified: Secondary | ICD-10-CM

## 2018-12-30 DIAGNOSIS — I10 Essential (primary) hypertension: Secondary | ICD-10-CM

## 2018-12-30 LAB — BAYER DCA HB A1C WAIVED: HB A1C (BAYER DCA - WAIVED): 7.8 % — ABNORMAL HIGH (ref <7.0)

## 2018-12-30 MED ORDER — NA SULFATE-K SULFATE-MG SULF 17.5-3.13-1.6 GM/177ML PO SOLN: ORAL | 0 refills | Status: DC

## 2018-12-30 MED ORDER — METFORMIN HCL 1000 MG PO TABS: 1000.0000 mg | ORAL_TABLET | Freq: Two times a day (BID) | ORAL | 1 refills | Status: DC

## 2018-12-30 MED ORDER — BENAZEPRIL HCL 40 MG PO TABS: 40.0000 mg | ORAL_TABLET | Freq: Every day | ORAL | 1 refills | Status: DC

## 2018-12-30 MED ORDER — ROSUVASTATIN CALCIUM 10 MG PO TABS: ORAL_TABLET | ORAL | 1 refills | Status: DC

## 2018-12-30 MED ORDER — HYDROCHLOROTHIAZIDE 25 MG PO TABS: 25.0000 mg | ORAL_TABLET | Freq: Every day | ORAL | 1 refills | Status: DC

## 2018-12-30 MED ORDER — OMEPRAZOLE 40 MG PO CPDR: DELAYED_RELEASE_CAPSULE | ORAL | 1 refills | Status: DC

## 2018-12-30 MED ORDER — AMLODIPINE BESYLATE 10 MG PO TABS: 10.0000 mg | ORAL_TABLET | Freq: Every day | ORAL | 1 refills | Status: DC

## 2018-12-30 NOTE — Patient Instructions (Signed)
Diabetes Mellitus and Foot Care  Foot care is an important part of your health, especially when you have diabetes. Diabetes may cause you to have problems because of poor blood flow (circulation) to your feet and legs, which can cause your skin to:   Become thinner and drier.   Break more easily.   Heal more slowly.   Peel and crack.  You may also have nerve damage (neuropathy) in your legs and feet, causing decreased feeling in them. This means that you may not notice minor injuries to your feet that could lead to more serious problems. Noticing and addressing any potential problems early is the best way to prevent future foot problems.  How to care for your feet  Foot hygiene   Wash your feet daily with warm water and mild soap. Do not use hot water. Then, pat your feet and the areas between your toes until they are completely dry. Do not soak your feet as this can dry your skin.   Trim your toenails straight across. Do not dig under them or around the cuticle. File the edges of your nails with an emery board or nail file.   Apply a moisturizing lotion or petroleum jelly to the skin on your feet and to dry, brittle toenails. Use lotion that does not contain alcohol and is unscented. Do not apply lotion between your toes.  Shoes and socks   Wear clean socks or stockings every day. Make sure they are not too tight. Do not wear knee-high stockings since they may decrease blood flow to your legs.   Wear shoes that fit properly and have enough cushioning. Always look in your shoes before you put them on to be sure there are no objects inside.   To break in new shoes, wear them for just a few hours a day. This prevents injuries on your feet.  Wounds, scrapes, corns, and calluses   Check your feet daily for blisters, cuts, bruises, sores, and redness. If you cannot see the bottom of your feet, use a mirror or ask someone for help.   Do not cut corns or calluses or try to remove them with medicine.   If you  find a minor scrape, cut, or break in the skin on your feet, keep it and the skin around it clean and dry. You may clean these areas with mild soap and water. Do not clean the area with peroxide, alcohol, or iodine.   If you have a wound, scrape, corn, or callus on your foot, look at it several times a day to make sure it is healing and not infected. Check for:  ? Redness, swelling, or pain.  ? Fluid or blood.  ? Warmth.  ? Pus or a bad smell.  General instructions   Do not cross your legs. This may decrease blood flow to your feet.   Do not use heating pads or hot water bottles on your feet. They may burn your skin. If you have lost feeling in your feet or legs, you may not know this is happening until it is too late.   Protect your feet from hot and cold by wearing shoes, such as at the beach or on hot pavement.   Schedule a complete foot exam at least once a year (annually) or more often if you have foot problems. If you have foot problems, report any cuts, sores, or bruises to your health care provider immediately.  Contact a health care provider if:     You have a medical condition that increases your risk of infection and you have any cuts, sores, or bruises on your feet.   You have an injury that is not healing.   You have redness on your legs or feet.   You feel burning or tingling in your legs or feet.   You have pain or cramps in your legs and feet.   Your legs or feet are numb.   Your feet always feel cold.   You have pain around a toenail.  Get help right away if:   You have a wound, scrape, corn, or callus on your foot and:  ? You have pain, swelling, or redness that gets worse.  ? You have fluid or blood coming from the wound, scrape, corn, or callus.  ? Your wound, scrape, corn, or callus feels warm to the touch.  ? You have pus or a bad smell coming from the wound, scrape, corn, or callus.  ? You have a fever.  ? You have a red line going up your leg.  Summary   Check your feet every day  for cuts, sores, red spots, swelling, and blisters.   Moisturize feet and legs daily.   Wear shoes that fit properly and have enough cushioning.   If you have foot problems, report any cuts, sores, or bruises to your health care provider immediately.   Schedule a complete foot exam at least once a year (annually) or more often if you have foot problems.  This information is not intended to replace advice given to you by your health care provider. Make sure you discuss any questions you have with your health care provider.  Document Released: 07/25/2000 Document Revised: 09/09/2017 Document Reviewed: 08/29/2016  Elsevier Interactive Patient Education  2019 [employer]

## 2018-12-30 NOTE — Progress Notes (Signed)
Subjective:    Patient ID: Jorge Garcia, male    DOB: Sep 02, 1945, 73 y.o.   MRN: [id number]   Chief Complaint: Medical Management of Chronic Issues    HPI:  1. Type 2 diabetes mellitus without complication, without long-term current use of insulin (HCC) Last hgab1c was 7.3%. fastingblood sugars have been running over 150. Has been as high as 200. No hypoglycemia that he is aware of.  2. Pure hypercholesterolemia Does not watch diet and does very little exercise. He says he des stay active but no dedicated exercise.  3. Essential hypertension No c/o chest pain, sob or headache. Does not check blood pressures at home. BP Readings from Last 3 Encounters:  12/30/18 136/74  12/16/18 (!) 142/71  12/06/18 (!) 161/86   4.GERD Takes omeprazole daily and works well to keep symptoms under control.  5. BMI 29.0 No recent weight changes  Outpatient Encounter Medications as of 12/30/2018  Medication Sig  . ACCU-CHEK AVIVA PLUS test strip USE 1 STRIP TO CHECK GLUCOSE 4 TIMES DAILY  . ACCU-CHEK SOFTCLIX LANCETS lancets USE 1  TO CHECK GLUCOSE 4 TIMES DAILY  . amLODipine (NORVASC) 10 MG tablet Take 1 tablet (10 mg total) by mouth daily.  Marland Kitchen aspirin EC 81 MG tablet Take 81 mg by mouth daily.  . benazepril (LOTENSIN) 40 MG tablet Take 1 tablet (40 mg total) by mouth daily.  . cholecalciferol (VITAMIN D) 1000 UNITS tablet Take 2,000 Units by mouth daily.   . fish oil-omega-3 fatty acids 1000 MG capsule Take 1 g by mouth daily.    . hydrochlorothiazide (HYDRODIURIL) 25 MG tablet Take 1 tablet (25 mg total) by mouth daily.  Marland Kitchen ibuprofen (ADVIL) 400 MG tablet Take 1 tablet (400 mg total) by mouth every 6 (six) hours as needed.  . loratadine (CLARITIN) 10 MG tablet Take 1 tablet (10 mg total) by mouth daily.  . metFORMIN (GLUCOPHAGE) 500 MG tablet Take 1 tablet (500 mg total) by mouth 2 (two) times daily with a meal.  . omeprazole (PRILOSEC) 40 MG capsule TAKE ONE CAPSULE BY MOUTH ONCE DAILY BEFORE  BREAKFAST  . rosuvastatin (CRESTOR) 10 MG tablet TAKE 1 TABLET (10 MG TOTAL) BY MOUTH DAILY.      New complaints: None today  Social history: Lives alone- as a friend that she spends a lot of time with     Review of Systems  Constitutional: Negative for activity change and appetite change.  HENT: Negative.   Eyes: Negative for pain.  Respiratory: Negative for shortness of breath.   Cardiovascular: Negative for chest pain, palpitations and leg swelling.  Gastrointestinal: Negative for abdominal pain.  Endocrine: Negative for polydipsia.  Genitourinary: Negative.   Skin: Negative for rash.  Neurological: Negative for dizziness, weakness and headaches.  Hematological: Does not bruise/bleed easily.  Psychiatric/Behavioral: Negative.   All other systems reviewed and are negative.      Objective:   Physical Exam Vitals signs and nursing note reviewed.  Constitutional:      Appearance: Normal appearance. He is well-developed.  HENT:     Head: Normocephalic.     Nose: Nose normal.  Eyes:     Pupils: Pupils are equal, round, and reactive to light.  Neck:     Musculoskeletal: Normal range of motion and neck supple.     Thyroid: No thyroid mass or thyromegaly.     Vascular: No carotid bruit or JVD.     Trachea: Phonation normal.  Cardiovascular:     Rate  and Rhythm: Normal rate and regular rhythm.  Pulmonary:     Effort: Pulmonary effort is normal. No respiratory distress.     Breath sounds: Normal breath sounds.  Abdominal:     General: Bowel sounds are normal.     Palpations: Abdomen is soft.     Tenderness: There is no abdominal tenderness.  Musculoskeletal: Normal range of motion.  Lymphadenopathy:     Cervical: No cervical adenopathy.  Skin:    General: Skin is warm and dry.  Neurological:     Mental Status: He is alert and oriented to person, place, and time.  Psychiatric:        Behavior: Behavior normal.        Thought Content: Thought content normal.         Judgment: Judgment normal.    BP 136/74   Pulse 66   Temp 98.2 F (36.8 C) (Oral)   Ht _0  (1.854 m)   Wt 227 lb (103 kg)   BMI 29.95 kg/m    hgba1c 7.8%     Assessment & Plan:  Jorge Garcia comes in today with chief complaint of Medical Management of Chronic Issues   Diagnosis and orders addressed:  1. Type 2 diabetes mellitus without complication, without long-term current use of insulin (HCC) Stricter carb counting - Bayer DCA Hb A1c Waived - metFORMIN (GLUCOPHAGE) 1000 MG tablet; Take 1 tablet (1,000 mg total) by mouth 2 (two) times daily with a meal.  Dispense: 180 tablet; Refill: 1  2. Pure hypercholesterolemia Low fat diet - Lipid panel - rosuvastatin (CRESTOR) 10 MG tablet; TAKE 1 TABLET (10 MG TOTAL) BY MOUTH DAILY.  Dispense: 90 tablet; Refill: 1  3. Essential hypertension Low sodium diet - CMP14+EGFR - benazepril (LOTENSIN) 40 MG tablet; Take 1 tablet (40 mg total) by mouth daily.  Dispense: 90 tablet; Refill: 1 - hydrochlorothiazide (HYDRODIURIL) 25 MG tablet; Take 1 tablet (25 mg total) by mouth daily.  Dispense: 90 tablet; Refill: 1 - amLODipine (NORVASC) 10 MG tablet; Take 1 tablet (10 mg total) by mouth daily.  Dispense: 90 tablet; Refill: 1  4. BMI 29.0-29.9,adult Discussed diet and exercise for person with BMI >25 Will recheck weight in 3-6 months  5. Gastroesophageal reflux disease without esophagitis Avoid spicy foods Do not eat 2 hours prior to bedtime - omeprazole (PRILOSEC) 40 MG capsule; TAKE ONE CAPSULE BY MOUTH ONCE DAILY BEFORE BREAKFAST  Dispense: 90 capsule; Refill: 1   Labs pending Health Maintenance reviewed Diet and exercise encouraged  Follow up plan: 3 months   Mary-Margaret Hassell Done, FNP

## 2018-12-30 NOTE — Progress Notes (Signed)
Patient's pre-visit was done today over the phone with the patient. Name,DOB and address verified. Insurance verified. Packet of Prep instructions mailed to patient including copy of a consent form and pre-procedure patient acknowledgement form-pt is aware. Patient understands to call us back with any questions or concerns.   Patient denies any allergies to eggs or soy. Patient denies any problems with anesthesia/sedation. Patient denies any oxygen use at home. Patient denies taking any diet/weight loss medications or blood thinners. EMMI education pt declined.

## 2018-12-31 LAB — CMP14+EGFR
ALT: 18 IU/L (ref 0–44)
AST: 20 IU/L (ref 0–40)
Albumin/Globulin Ratio: 2.4 — ABNORMAL HIGH (ref 1.2–2.2)
Albumin: 4.7 g/dL (ref 3.8–4.8)
Alkaline Phosphatase: 58 IU/L (ref 39–117)
BUN/Creatinine Ratio: 10 (ref 10–24)
BUN: 10 mg/dL (ref 8–27)
Bilirubin Total: 1.9 mg/dL — ABNORMAL HIGH (ref 0.0–1.2)
CO2: 27 mmol/L (ref 20–29)
Calcium: 9.6 mg/dL (ref 8.6–10.2)
Chloride: 100 mmol/L (ref 96–106)
Creatinine, Ser: 1.02 mg/dL (ref 0.76–1.27)
GFR calc Af Amer: 86 mL/min/{1.73_m2} (ref >59)
GFR calc non Af Amer: 74 mL/min/{1.73_m2} (ref >59)
Globulin, Total: 2 g/dL (ref 1.5–4.5)
Glucose: 158 mg/dL — ABNORMAL HIGH (ref 65–99)
Potassium: 4 mmol/L (ref 3.5–5.2)
Sodium: 140 mmol/L (ref 134–144)
Total Protein: 6.7 g/dL (ref 6.0–8.5)

## 2018-12-31 LAB — LIPID PANEL
Chol/HDL Ratio: 3.5 ratio (ref 0.0–5.0)
Cholesterol, Total: 111 mg/dL (ref 100–199)
HDL: 32 mg/dL — ABNORMAL LOW (ref >39)
LDL Calculated: 47 mg/dL (ref 0–99)
Triglycerides: 161 mg/dL — ABNORMAL HIGH (ref 0–149)
VLDL Cholesterol Cal: 32 mg/dL (ref 5–40)

## 2019-01-06 ENCOUNTER — Encounter: Payer: Self-pay | Admitting: Gastroenterology

## 2019-01-18 ENCOUNTER — Telehealth: Payer: Self-pay | Admitting: *Deleted

## 2019-01-18 NOTE — Telephone Encounter (Signed)
Left message

## 2019-01-19 ENCOUNTER — Telehealth: Payer: Self-pay | Admitting: *Deleted

## 2019-01-19 NOTE — Telephone Encounter (Signed)

## 2019-01-19 NOTE — Telephone Encounter (Signed)
Patient return call. ?

## 2019-01-20 ENCOUNTER — Ambulatory Visit (AMBULATORY_SURGERY_CENTER): Payer: Medicare HMO | Admitting: Gastroenterology

## 2019-01-20 ENCOUNTER — Encounter: Payer: Self-pay | Admitting: Gastroenterology

## 2019-01-20 ENCOUNTER — Other Ambulatory Visit: Payer: Self-pay

## 2019-01-20 VITALS — BP 143/75 | HR 61 | Temp 98.9°F | Resp 16 | Ht 73.0 in | Wt 227.0 lb

## 2019-01-20 DIAGNOSIS — D122 Benign neoplasm of ascending colon: Secondary | ICD-10-CM | POA: Diagnosis not present

## 2019-01-20 DIAGNOSIS — E119 Type 2 diabetes mellitus without complications: Secondary | ICD-10-CM | POA: Diagnosis not present

## 2019-01-20 DIAGNOSIS — I10 Essential (primary) hypertension: Secondary | ICD-10-CM | POA: Diagnosis not present

## 2019-01-20 DIAGNOSIS — D123 Benign neoplasm of transverse colon: Secondary | ICD-10-CM | POA: Diagnosis not present

## 2019-01-20 DIAGNOSIS — D124 Benign neoplasm of descending colon: Secondary | ICD-10-CM

## 2019-01-20 DIAGNOSIS — Z86010 Personal history of colonic polyps: Secondary | ICD-10-CM

## 2019-01-20 DIAGNOSIS — D125 Benign neoplasm of sigmoid colon: Secondary | ICD-10-CM | POA: Diagnosis not present

## 2019-01-20 DIAGNOSIS — K219 Gastro-esophageal reflux disease without esophagitis: Secondary | ICD-10-CM | POA: Diagnosis not present

## 2019-01-20 DIAGNOSIS — Z860101 Personal history of adenomatous and serrated colon polyps: Secondary | ICD-10-CM

## 2019-01-20 MED ORDER — SODIUM CHLORIDE 0.9 % IV SOLN: 500.0000 mL | Freq: Once | INTRAVENOUS | Status: DC

## 2019-01-20 NOTE — Op Note (Signed)
[facility] Patient Name: Jorge Garcia Procedure Date: 01/20/2019 9:45 AM MRN: [id number] Endoscopist: Ladene Artist , MD Age: 73 Referring MD:  Date of Birth: 1946-02-24 Gender: Male Account #: [id number] Procedure:                Colonoscopy Indications:              Surveillance: Personal history of adenomatous                            polyps on last colonoscopy 5 years ago Medicines:                Monitored Anesthesia Care Procedure:                Pre-Anesthesia Assessment:                           - Prior to the procedure, a History and Physical                            was performed, and patient medications and                            allergies were reviewed. The patient's tolerance of                            previous anesthesia was also reviewed. The risks                            and benefits of the procedure and the sedation                            options and risks were discussed with the patient.                            All questions were answered, and informed consent                            was obtained. Prior Anticoagulants: The patient has                            taken no previous anticoagulant or antiplatelet                            agents. ASA Grade Assessment: II - A patient with                            mild systemic disease. After reviewing the risks                            and benefits, the patient was deemed in                            satisfactory condition to undergo the procedure.  After obtaining informed consent, the colonoscope                            was passed under direct vision. Throughout the                            procedure, the patient's blood pressure, pulse, and                            oxygen saturations were monitored continuously. The                            Colonoscope was introduced through the anus and                            advanced to the the cecum,  identified by                            appendiceal orifice and ileocecal valve. The                            ileocecal valve, appendiceal orifice, and rectum                            were photographed. The quality of the bowel                            preparation was good. The colonoscopy was performed                            without difficulty. The patient tolerated the                            procedure well. Scope In: 0:16:01 AM Scope Out: 10:21:52 AM Scope Withdrawal Time: 0 hours 20 minutes 18 seconds  Total Procedure Duration: 0 hours 25 minutes 6 seconds  Findings:                 The perianal and digital rectal examinations were                            normal.                           Five sessile polyps were found in the sigmoid colon                            (1), descending colon (1), transverse colon (1) and                            ascending colon (2). The polyps were 6 to 9 mm in                            size. These polyps were removed with a cold snare.  Resection and retrieval were complete.                           Multiple medium-mouthed diverticula were found in                            the left colon. There was evidence of diverticular                            spasm. Peri-diverticular erythema was seen. There                            was no evidence of diverticular bleeding.                           Internal hemorrhoids were found during                            retroflexion. The hemorrhoids were small and Grade                            I (internal hemorrhoids that do not prolapse).                           The exam was otherwise without abnormality on                            direct and retroflexion views. Complications:            No immediate complications. Estimated blood loss:                            None. Estimated Blood Loss:     Estimated blood loss: none. Impression:               - Five 6 to  9 mm polyps in the sigmoid colon, in                            the descending colon, in the transverse colon and                            in the ascending colon, removed with a cold snare.                            Resected and retrieved.                           - Moderate diverticulosis in the left colon.                           - Internal hemorrhoids.                           - The examination was otherwise normal on direct  and retroflexion views. Recommendation:           - Repeat colonoscopy in 3 years for surveillance                            pending pathology review.                           - Patient has a contact number available for                            emergencies. The signs and symptoms of potential                            delayed complications were discussed with the                            patient. Return to normal activities tomorrow.                            Written discharge instructions were provided to the                            patient.                           - High fiber diet.                           - Continue present medications.                           - Await pathology results. Ladene Artist, MD 01/20/2019 10:27:03 AM This report has been signed electronically.

## 2019-01-20 NOTE — Patient Instructions (Signed)
Handouts given for polyps, diverticulosis, hemorrhoids and  high fiber diet.  YOU HAD AN ENDOSCOPIC PROCEDURE TODAY AT [facility]:   Refer to the procedure report that was given to you for any specific questions about what was found during the examination.  If the procedure report does not answer your questions, please call your gastroenterologist to clarify.  If you requested that your care partner not be given the details of your procedure findings, then the procedure report has been included in a sealed envelope for you to review at your convenience later.  YOU SHOULD EXPECT: Some feelings of bloating in the abdomen. Passage of more gas than usual.  Walking can help get rid of the air that was put into your GI tract during the procedure and reduce the bloating. If you had a lower endoscopy (such as a colonoscopy or flexible sigmoidoscopy) you may notice spotting of blood in your stool or on the toilet paper. If you underwent a bowel prep for your procedure, you may not have a normal bowel movement for a few days.  Please Note:  You might notice some irritation and congestion in your nose or some drainage.  This is from the oxygen used during your procedure.  There is no need for concern and it should clear up in a day or so.  SYMPTOMS TO REPORT IMMEDIATELY:   Following lower endoscopy (colonoscopy or flexible sigmoidoscopy):  Excessive amounts of blood in the stool  Significant tenderness or worsening of abdominal pains  Swelling of the abdomen that is new, acute  Fever of 100F or higher   For urgent or emergent issues, a gastroenterologist can be reached at any hour by calling [phone].   DIET:  We do recommend a small meal at first, but then you may proceed to your regular diet.  Drink plenty of fluids but you should avoid alcoholic beverages for 24 hours.  ACTIVITY:  You should plan to take it easy for the rest of today and you should NOT DRIVE or use heavy  machinery until tomorrow (because of the sedation medicines used during the test).    FOLLOW UP: Our staff will call the number listed on your records 48-72 hours following your procedure to check on you and address any questions or concerns that you may have regarding the information given to you following your procedure. If we do not reach you, we will leave a message.  We will attempt to reach you two times.  During this call, we will ask if you have developed any symptoms of COVID 19. If you develop any symptoms (ie: fever, flu-like symptoms, shortness of breath, cough etc.) before then, please call [phone].  If you test positive for Covid 19 in the 2 weeks post procedure, please call and report this information to [place].    If any biopsies were taken you will be contacted by phone or by letter within the next 1-3 weeks.  Please call us at [phone] if you have not heard about the biopsies in 3 weeks.    SIGNATURES/CONFIDENTIALITY: You and/or your care partner have signed paperwork which will be entered into your electronic medical record.  These signatures attest to the fact that that the information above on your After Visit Summary has been reviewed and is understood.  Full responsibility of the confidentiality of this discharge information lies with you and/or your care-partner.

## 2019-01-20 NOTE — Progress Notes (Signed)
Report to PACU, RN, vss, BBS= Clear.  

## 2019-01-20 NOTE — Progress Notes (Signed)
Pt's states no medical or surgical changes since previsit or office visit.  Temps taken by CW V/S taken by JB

## 2019-01-21 ENCOUNTER — Telehealth: Payer: Self-pay | Admitting: Nurse Practitioner

## 2019-01-24 ENCOUNTER — Telehealth: Payer: Self-pay

## 2019-01-24 NOTE — Telephone Encounter (Signed)
No voicemail and unable to leave message on follow up call. 

## 2019-01-24 NOTE — Telephone Encounter (Signed)
No answer, left message to call if having any issues or concerns, B.Delquan Poucher RN 

## 2019-01-31 ENCOUNTER — Ambulatory Visit (INDEPENDENT_AMBULATORY_CARE_PROVIDER_SITE_OTHER): Payer: Medicare HMO | Admitting: *Deleted

## 2019-01-31 ENCOUNTER — Encounter: Payer: Self-pay | Admitting: *Deleted

## 2019-01-31 ENCOUNTER — Other Ambulatory Visit: Payer: Self-pay

## 2019-01-31 DIAGNOSIS — Z0000 Encounter for general adult medical examination without abnormal findings: Secondary | ICD-10-CM

## 2019-01-31 NOTE — Progress Notes (Addendum)
MEDICARE ANNUAL WELLNESS VISIT  01/31/2019  Telephone Visit Disclaimer This Medicare AWV was conducted by telephone due to national recommendations for restrictions regarding the COVID-19 Pandemic (e.g. social distancing).  I verified, using two identifiers, that I am speaking with Kirke Corin or their authorized healthcare agent. I discussed the limitations, risks, security, and privacy concerns of performing an evaluation and management service by telephone and the potential availability of an in-person appointment in the future. The patient expressed understanding and agreed to proceed.   Subjective:  Hobie Kohles is a 73 y.o. male patient of Chevis Pretty, [place] who had a Medicare Annual Wellness Visit today via telephone. Jacque is Retired and lives alone. he has 0 children. he reports that he is socially active and does interact with friends/family regularly. he is minimally physically active and enjoys hunting.  Patient Care Team: Chevis Pretty, FNP as PCP - General (Nurse Practitioner)  Advanced Directives 01/31/2019 12/06/2018 01/27/2018 01/26/2017 06/12/2014 06/12/2014  Does Patient Have a Medical Advance Directive? Yes Yes Yes Yes Yes Yes  Type of Advance Directive Living will Living will [place];Living will [place];Living will - Living will  Does patient want to make changes to medical advance directive? No - Patient declined No - Patient declined No - Patient declined - - -  Copy of [place] in Chart? - - No - copy requested No - copy requested No - copy requested No - copy requested    Hospital Utilization Over the Past 12 Months: # of hospitalizations or ER visits: 1 # of surgeries: 1  Review of Systems    Patient reports that his overall health is unchanged compared to last year.  Patient Reported Readings (BP, Pulse, CBG, Weight, etc) none  Review of Systems: No complaints  All other  systems negative.  Pain Assessment Pain : No/denies pain     Current Medications & Allergies (verified) Allergies as of 01/31/2019   No Known Allergies     Medication List       Accurate as of January 31, 2019 11:14 AM. If you have any questions, ask your nurse or doctor.        Accu-Chek Aviva Plus test strip Generic drug: glucose blood USE 1 STRIP TO CHECK GLUCOSE 4 TIMES DAILY   Accu-Chek Softclix Lancets lancets USE 1  TO CHECK GLUCOSE 4 TIMES DAILY   amLODipine 10 MG tablet Commonly known as: NORVASC Take 1 tablet (10 mg total) by mouth daily.   aspirin EC 81 MG tablet Take 81 mg by mouth daily.   benazepril 40 MG tablet Commonly known as: LOTENSIN Take 1 tablet (40 mg total) by mouth daily.   cholecalciferol 1000 units tablet Commonly known as: VITAMIN D Take 2,000 Units by mouth daily.   fish oil-omega-3 fatty acids 1000 MG capsule Take 1 g by mouth daily.   hydrochlorothiazide 25 MG tablet Commonly known as: HYDRODIURIL Take 1 tablet (25 mg total) by mouth daily.   ibuprofen 400 MG tablet Commonly known as: ADVIL Take 1 tablet (400 mg total) by mouth every 6 (six) hours as needed.   loratadine 10 MG tablet Commonly known as: CLARITIN Take 1 tablet (10 mg total) by mouth daily.   metFORMIN 1000 MG tablet Commonly known as: GLUCOPHAGE Take 1 tablet (1,000 mg total) by mouth 2 (two) times daily with a meal.   omeprazole 40 MG capsule Commonly known as: PRILOSEC TAKE ONE CAPSULE BY MOUTH ONCE DAILY  BEFORE BREAKFAST   rosuvastatin 10 MG tablet Commonly known as: Crestor TAKE 1 TABLET (10 MG TOTAL) BY MOUTH DAILY.       History (reviewed): Past Medical History:  Diagnosis Date  . Diabetes mellitus without complication ([place])   . History of CVA (cerebrovascular accident)   . Hyperlipidemia   . Hypertension   . Internal hemorrhoids   . Tubulovillous adenoma of colon 2009  . Vitamin D deficiency    Past Surgical History:  Procedure  Laterality Date  . COLONOSCOPY  12/01/2013  . [place]  . POLYPECTOMY    . UPPER GASTROINTESTINAL ENDOSCOPY     Family History  Problem Relation Age of Onset  . Diabetes Mother   . Hypertension Mother   . Stroke Mother 20  . Diabetes Father   . Hypertension Father   . Heart attack Father 35  . Colon cancer Neg Hx   . Colon polyps Neg Hx   . Esophageal cancer Neg Hx   . Stomach cancer Neg Hx   . Rectal cancer Neg Hx    Social History   Socioeconomic History  . Marital status: Single    Spouse name: Not on file  . Number of children: 0  . Years of education: 66  . Highest education level: 12th grade  Occupational History  . Occupation: Retired    Fish farm manager: [employer]  . Financial resource strain: Not hard at all  . Food insecurity    Worry: Never true    Inability: Never true  . Transportation needs    Medical: No    Non-medical: No  Tobacco Use  . Smoking status: Former Smoker    Packs/day: 1.00    Years: 40.00    Pack years: 40.00    Types: Cigarettes    Quit date: 11/09/2005    Years since quitting: 13.2  . Smokeless tobacco: Never Used  Substance and Sexual Activity  . Alcohol use: No  . Drug use: No  . Sexual activity: Not Currently  Lifestyle  . Physical activity    Days per week: 4 days    Minutes per session: 30 min  . Stress: Not at all  Relationships  . Social connections    Talks on phone: More than three times a week    Gets together: More than three times a week    Attends religious service: More than 4 times per year    Active member of club or organization: Yes    Attends meetings of clubs or organizations: More than 4 times per year    Relationship status: Divorced  Other Topics Concern  . Not on file  Social History Narrative  . Not on file    Activities of Daily Living In your present state of health, do you have any difficulty performing the following activities: 01/31/2019  Hearing? N  Vision?  N  Difficulty concentrating or making decisions? N  Walking or climbing stairs? N  Dressing or bathing? N  Doing errands, shopping? N  Preparing Food and eating ? N  Using the Toilet? N  In the past six months, have you accidently leaked urine? N  Do you have problems with loss of bowel control? N  Managing your Medications? N  Managing your Finances? N  Housekeeping or managing your Housekeeping? N  Some recent data might be hidden    Patient Literacy How often do you need to have someone help you when you read instructions,  pamphlets, or other written materials from your doctor or pharmacy?: 1 - Never What is the last grade level you completed in school?: 12th grade  Exercise Current Exercise Habits: Home exercise routine, Type of exercise: walking, Time (Minutes): 30, Frequency (Times/Week): 4, Weekly Exercise (Minutes/Week): 120, Intensity: Mild, Exercise limited by: None identified  Diet Patient reports consuming 2 meals a day and 1 snack(s) a day Patient reports that his primary diet is: Regular Patient reports that she does have regular access to food.   Depression Screen PHQ 2/9 Scores 01/31/2019 12/30/2018 12/16/2018 09/27/2018 06/25/2018 05/20/2018 03/25/2018  PHQ - 2 Score 0 0 0 0 0 0 0     Fall Risk Fall Risk  01/31/2019 12/30/2018 12/16/2018 09/27/2018 06/25/2018  Falls in the past year? 0 0 0 0 0  Comment - - - - -     Objective:  Jonothan Heberle seemed alert and oriented and he participated appropriately during our telephone visit.  Blood Pressure Weight BMI  BP Readings from Last 3 Encounters:  01/20/19 (!) 143/75  12/30/18 136/74  12/16/18 (!) 142/71   Wt Readings from Last 3 Encounters:  01/20/19 227 lb (103 kg)  12/30/18 227 lb (103 kg)  12/30/18 227 lb (103 kg)   BMI Readings from Last 1 Encounters:  01/20/19 29.95 kg/m    *Unable to obtain current vital signs, weight, and BMI due to telephone visit type  Hearing/Vision  . Antinio did not seem to have  difficulty with hearing/understanding during the telephone conversation . Reports that he has had a formal eye exam by an eye care professional within the past year . Reports that he has not had a formal hearing evaluation within the past year *Unable to fully assess hearing and vision during telephone visit type  Cognitive Function: 6CIT Screen 01/31/2019  What Year? 0 points  What month? 0 points  What time? 0 points  Count back from 20 0 points  Months in reverse 0 points  Repeat phrase 0 points  Total Score 0   (Normal:0-7, Significant for Dysfunction: >8)  Normal Cognitive Function Screening: Yes   Immunization & Health Maintenance Record Immunization History  Administered Date(s) Administered  . Influenza, High Dose Seasonal PF 05/19/2016, 05/26/2017, 05/25/2018  . Influenza-Unspecified 05/11/2013, 05/20/2014, 05/26/2015  . Pneumococcal Conjugate-13 05/27/2013  . Pneumococcal Polysaccharide-23 12/22/2014  . Td 05/12/2003, 12/06/2018    Health Maintenance  Topic Date Due  . INFLUENZA VACCINE  03/12/2019  . OPHTHALMOLOGY EXAM  05/26/2019  . HEMOGLOBIN A1C  07/02/2019  . FOOT EXAM  12/30/2019  . COLONOSCOPY  01/20/2024  . TETANUS/TDAP  12/05/2028  . Hepatitis C Screening  Completed  . PNA vac Low Risk Adult  Completed       Assessment  This is a routine wellness examination for Blain Hunsucker.  Health Maintenance: Due or Overdue There are no preventive care reminders to display for this patient.  Moua Rasmusson does not need a referral for Community Assistance: Care Management:   no Social Work:    no Prescription Assistance:  no Nutrition/Diabetes Education:  no   Plan:  Personalized Goals Goals Addressed            This Visit's Progress   . DIET - REDUCE SUGAR INTAKE        Personalized Health Maintenance & Screening Recommendations  Advanced directives: has an advanced directive - a copy HAS NOT been provided.  Lung Cancer Screening Recommended:  no (Low Dose CT Chest recommended if Age 61-80  years, 30 pack-year currently smoking OR have quit w/in past 15 years) Hepatitis C Screening recommended: no HIV Screening recommended: no  Advanced Directives: Written information was not prepared per patient's request.  Referrals & Orders No orders of the defined types were placed in this encounter.   Follow-up Plan . Follow-up with Chevis Pretty, FNP as planned . Please bring a copy of your Living Will for [place] to scan in your chart.   I have personally reviewed and noted the following in the patient's chart:   . Medical and social history . Use of alcohol, tobacco or illicit drugs  . Current medications and supplements . Functional ability and status . Nutritional status . Physical activity . Advanced directives . List of other physicians . Hospitalizations, surgeries, and ER visits in previous 12 months . Vitals . Screenings to include cognitive, depression, and falls . Referrals and appointments  In addition, I have reviewed and discussed with Kirke Corin certain preventive protocols, quality metrics, and best practice recommendations. A written personalized care plan for preventive services as well as general preventive health recommendations is available and can be mailed to the patient at his request.      Marylin Crosby, LPN  08/19/3233   I have reviewed and agree with the above AWV documentation.   Mary-Margaret Hassell Done, FNP

## 2019-01-31 NOTE — Patient Instructions (Signed)
Preventive Care 2 Years and Older, Male Preventive care refers to lifestyle choices and visits with your health care provider that can promote health and wellness. What does preventive care include?   A yearly physical exam. This is also called an annual well check.  Dental exams once or twice a year.  Routine eye exams. Ask your health care provider how often you should have your eyes checked.  Personal lifestyle choices, including: ? Daily care of your teeth and gums. ? Regular physical activity. ? Eating a healthy diet. ? Avoiding tobacco and drug use. ? Limiting alcohol use. ? Practicing safe sex. ? Taking low doses of aspirin every day. ? Taking vitamin and mineral supplements as recommended by your health care provider. What happens during an annual well check? The services and screenings done by your health care provider during your annual well check will depend on your age, overall health, lifestyle risk factors, and family history of disease. Counseling Your health care provider may ask you questions about your:  Alcohol use.  Tobacco use.  Drug use.  Emotional well-being.  Home and relationship well-being.  Sexual activity.  Eating habits.  History of falls.  Memory and ability to understand (cognition).  Work and work Statistician. Screening You may have the following tests or measurements:  Height, weight, and BMI.  Blood pressure.  Lipid and cholesterol levels. These may be checked every 5 years, or more frequently if you are over 9 years old.  Skin check.  Lung cancer screening. You may have this screening every year starting at age 57 if you have a 30-pack-year history of smoking and currently smoke or have quit within the past 15 years.  Colorectal cancer screening. All adults should have this screening starting at age 90 and continuing until age 69. You will have tests every 1-10 years, depending on your results and the type of screening  test. People at increased risk should start screening at an earlier age. Screening tests may include: ? Guaiac-based fecal occult blood testing. ? Fecal immunochemical test (FIT). ? Stool DNA test. ? Virtual colonoscopy. ? Sigmoidoscopy. During this test, a flexible tube with a tiny camera (sigmoidoscope) is used to examine your rectum and lower colon. The sigmoidoscope is inserted through your anus into your rectum and lower colon. ? Colonoscopy. During this test, a long, thin, flexible tube with a tiny camera (colonoscope) is used to examine your entire colon and rectum.  Prostate cancer screening. Recommendations will vary depending on your family history and other risks.  Hepatitis C blood test.  Hepatitis B blood test.  Sexually transmitted disease (STD) testing.  Diabetes screening. This is done by checking your blood sugar (glucose) after you have not eaten for a while (fasting). You may have this done every 1-3 years.  Abdominal aortic aneurysm (AAA) screening. You may need this if you are a current or former smoker.  Osteoporosis. You may be screened starting at age 30 if you are at high risk. Talk with your health care provider about your test results, treatment options, and if necessary, the need for more tests. Vaccines Your health care provider may recommend certain vaccines, such as:  Influenza vaccine. This is recommended every year.  Tetanus, diphtheria, and acellular pertussis (Tdap, Td) vaccine. You may need a Td booster every 10 years.  Varicella vaccine. You may need this if you have not been vaccinated.  Zoster vaccine. You may need this after age 42.  Measles, mumps, and rubella (MMR) vaccine.  You may need at least one dose of MMR if you were born in 1957 or later. You may also need a second dose.  Pneumococcal 13-valent conjugate (PCV13) vaccine. One dose is recommended after age 65.  Pneumococcal polysaccharide (PPSV23) vaccine. One dose is recommended  after age 65.  Meningococcal vaccine. You may need this if you have certain conditions.  Hepatitis A vaccine. You may need this if you have certain conditions or if you travel or work in places where you may be exposed to hepatitis A.  Hepatitis B vaccine. You may need this if you have certain conditions or if you travel or work in places where you may be exposed to hepatitis B.  Haemophilus influenzae type b (Hib) vaccine. You may need this if you have certain risk factors. Talk to your health care provider about which screenings and vaccines you need and how often you need them. This information is not intended to replace advice given to you by your health care provider. Make sure you discuss any questions you have with your health care provider. Document Released: 08/24/2015 Document Revised: 09/17/2017 Document Reviewed: 05/29/2015 Elsevier Interactive Patient Education  2019 Elsevier Inc.  

## 2019-02-08 ENCOUNTER — Encounter: Payer: Self-pay | Admitting: Gastroenterology

## 2019-02-23 ENCOUNTER — Telehealth: Payer: Self-pay | Admitting: Gastroenterology

## 2019-02-23 NOTE — Telephone Encounter (Signed)
Left message for patient to call back  

## 2019-02-23 NOTE — Telephone Encounter (Signed)
I reviewed the results and letter with the patient.  All questions answered. He will call back for any additional questions or concerns.

## 2019-02-23 NOTE — Telephone Encounter (Signed)
Pt called to discuss path results.

## 2019-02-24 ENCOUNTER — Telehealth: Payer: Self-pay | Admitting: Nurse Practitioner

## 2019-02-24 NOTE — Telephone Encounter (Signed)
This encounter was created in error - please disregard.

## 2019-02-24 NOTE — Addendum Note (Signed)
Addended by: Clerance Lav on: 02/24/2019 09:42 AM   Modules accepted: Level of Service, SmartSet

## 2019-02-24 NOTE — Chronic Care Management (AMB) (Signed)
°  Chronic Care Management   Outreach Note  02/24/2019 Name: Jorge Garcia MRN: [id number] DOB: 06-08-1946  Referred by: Chevis Pretty, FNP Reason for referral : Chronic Care Management (Third CCM outreach was unsuccessful. )   Third unsuccessful telephone outreach was attempted today. The patient was referred to the case management team for assistance with chronic care management and care coordination. The patient's primary care provider has been notified of our unsuccessful attempts to make or maintain contact with the patient. The care management team is pleased to engage with this patient at any time in the future should he/she be interested in assistance from the care management team.   Follow Up Plan: The care management team is available to follow up with the patient after provider conversation with the patient regarding recommendation for care management engagement and subsequent re-referral to the care management team.   [place]  [email or web address]   ??[phone]

## 2019-02-24 NOTE — Chronic Care Management (AMB) (Signed)
°  Chronic Care Management   Outreach Note  02/24/2019 Name: Kortney Schoenfelder MRN: [id number] DOB: 03/07/1946  Referred by: Chevis Pretty, FNP Reason for referral : Chronic Care Management (Third CCM outreach was unsuccessful.)   Third unsuccessful telephone outreach was attempted today. The patient was referred to the case management team for assistance with chronic care management and care coordination. The patient's primary care provider has been notified of our unsuccessful attempts to make or maintain contact with the patient. The care management team is pleased to engage with this patient at any time in the future should he/she be interested in assistance from the care management team.   Follow Up Plan: The care management team is available to follow up with the patient after provider conversation with the patient regarding recommendation for care management engagement and subsequent re-referral to the care management team.   [place]  [email or web address]   ??[phone]

## 2019-04-08 ENCOUNTER — Other Ambulatory Visit: Payer: Self-pay

## 2019-04-11 ENCOUNTER — Other Ambulatory Visit: Payer: Self-pay

## 2019-04-11 ENCOUNTER — Encounter: Payer: Self-pay | Admitting: Nurse Practitioner

## 2019-04-11 ENCOUNTER — Ambulatory Visit (INDEPENDENT_AMBULATORY_CARE_PROVIDER_SITE_OTHER): Payer: Medicare HMO | Admitting: Nurse Practitioner

## 2019-04-11 VITALS — BP 97/51 | HR 71 | Temp 97.3°F | Ht 73.0 in | Wt 218.0 lb

## 2019-04-11 DIAGNOSIS — K219 Gastro-esophageal reflux disease without esophagitis: Secondary | ICD-10-CM

## 2019-04-11 DIAGNOSIS — E7800 Pure hypercholesterolemia, unspecified: Secondary | ICD-10-CM | POA: Diagnosis not present

## 2019-04-11 DIAGNOSIS — Z6829 Body mass index (BMI) 29.0-29.9, adult: Secondary | ICD-10-CM | POA: Diagnosis not present

## 2019-04-11 DIAGNOSIS — I10 Essential (primary) hypertension: Secondary | ICD-10-CM

## 2019-04-11 DIAGNOSIS — E119 Type 2 diabetes mellitus without complications: Secondary | ICD-10-CM

## 2019-04-11 LAB — BAYER DCA HB A1C WAIVED: HB A1C (BAYER DCA - WAIVED): 7.3 % — ABNORMAL HIGH (ref <7.0)

## 2019-04-11 MED ORDER — ROSUVASTATIN CALCIUM 10 MG PO TABS: ORAL_TABLET | ORAL | 1 refills | Status: DC

## 2019-04-11 MED ORDER — BENAZEPRIL HCL 40 MG PO TABS: 40.0000 mg | ORAL_TABLET | Freq: Every day | ORAL | 1 refills | Status: DC

## 2019-04-11 MED ORDER — AMLODIPINE BESYLATE 10 MG PO TABS: 10.0000 mg | ORAL_TABLET | Freq: Every day | ORAL | 1 refills | Status: DC

## 2019-04-11 MED ORDER — METFORMIN HCL 1000 MG PO TABS: 1000.0000 mg | ORAL_TABLET | Freq: Two times a day (BID) | ORAL | 1 refills | Status: DC

## 2019-04-11 MED ORDER — HYDROCHLOROTHIAZIDE 25 MG PO TABS: 25.0000 mg | ORAL_TABLET | Freq: Every day | ORAL | 1 refills | Status: DC

## 2019-04-11 MED ORDER — OMEPRAZOLE 40 MG PO CPDR: DELAYED_RELEASE_CAPSULE | ORAL | 1 refills | Status: DC

## 2019-04-11 NOTE — Patient Instructions (Signed)
Exercising to Stay Healthy To become healthy and stay healthy, it is recommended that you do moderate-intensity and vigorous-intensity exercise. You can tell that you are exercising at a moderate intensity if your heart starts beating faster and you start breathing faster but can still hold a conversation. You can tell that you are exercising at a vigorous intensity if you are breathing much harder and faster and cannot hold a conversation while exercising. Exercising regularly is important. It has many health benefits, such as:  Improving overall fitness, flexibility, and endurance.  Increasing bone density.  Helping with weight control.  Decreasing body fat.  Increasing muscle strength.  Reducing stress and tension.  Improving overall health. How often should I exercise? Choose an activity that you enjoy, and set realistic goals. Your health care provider can help you make an activity plan that works for you. Exercise regularly as told by your health care provider. This may include:  Doing strength training two times a week, such as: ? Lifting weights. ? Using resistance bands. ? Push-ups. ? Sit-ups. ? Yoga.  Doing a certain intensity of exercise for a given amount of time. Choose from these options: ? A total of 150 minutes of moderate-intensity exercise every week. ? A total of 75 minutes of vigorous-intensity exercise every week. ? A mix of moderate-intensity and vigorous-intensity exercise every week. Children, pregnant women, people who have not exercised regularly, people who are overweight, and older adults may need to talk with a health care provider about what activities are safe to do. If you have a medical condition, be sure to talk with your health care provider before you start a new exercise program. What are some exercise ideas? Moderate-intensity exercise ideas include:  Walking 1 mile (1.6 km) in about 15 minutes.  Biking.  Hiking.  Golfing.  Dancing.   Water aerobics. Vigorous-intensity exercise ideas include:  Walking 4.5 miles (7.2 km) or more in about 1 hour.  Jogging or running 5 miles (8 km) in about 1 hour.  Biking 10 miles (16.1 km) or more in about 1 hour.  Lap swimming.  Roller-skating or in-line skating.  Cross-country skiing.  Vigorous competitive sports, such as football, basketball, and soccer.  Jumping rope.  Aerobic dancing. What are some everyday activities that can help me to get exercise?  Yard work, such as: ? Pushing a lawn mower. ? Raking and bagging leaves.  Washing your car.  Pushing a stroller.  Shoveling snow.  Gardening.  Washing windows or floors. How can I be more active in my day-to-day activities?  Use stairs instead of an elevator.  Take a walk during your lunch break.  If you drive, park your car farther away from your work or school.  If you take public transportation, get off one stop early and walk the rest of the way.  Stand up or walk around during all of your indoor phone calls.  Get up, stretch, and walk around every 30 minutes throughout the day.  Enjoy exercise with a friend. Support to continue exercising will help you keep a regular routine of activity. What guidelines can I follow while exercising?  Before you start a new exercise program, talk with your health care provider.  Do not exercise so much that you hurt yourself, feel dizzy, or get very short of breath.  Wear comfortable clothes and wear shoes with good support.  Drink plenty of water while you exercise to prevent dehydration or heat stroke.  Work out until your breathing   and your heartbeat get faster. Where to find more information  U.S. Department of Health and Human Services: www.hhs.gov  Centers for Disease Control and Prevention (CDC): www.cdc.gov Summary  Exercising regularly is important. It will improve your overall fitness, flexibility, and endurance.  Regular exercise also will  improve your overall health. It can help you control your weight, reduce stress, and improve your bone density.  Do not exercise so much that you hurt yourself, feel dizzy, or get very short of breath.  Before you start a new exercise program, talk with your health care provider. This information is not intended to replace advice given to you by your health care provider. Make sure you discuss any questions you have with your health care provider. Document Released: 08/30/2010 Document Revised: 07/10/2017 Document Reviewed: 06/18/2017 Elsevier Patient Education  2020 Elsevier Inc.  

## 2019-04-11 NOTE — Progress Notes (Signed)
Subjective:    Patient ID: Jorge Garcia, male    DOB: Feb 05, 1946, 73 y.o.   MRN: [id number]   Chief Complaint: Medical Management of Chronic Issues    HPI:  1. Type 2 diabetes mellitus without complication, without long-term current use of insulin (HCC) Tries to watch carbs. Does not check his blood sugar very often. hgba1c was 7.8 last visit- no changes were made to plan of care. Lab Results  Component Value Date   HGBA1C 7.8 (H) 12/30/2018     2. Pure hypercholesterolemia He does not watch fats in diet and does no dedicated exercise. Lab Results  Component Value Date   CHOL 111 12/30/2018   HDL 32 (L) 12/30/2018   LDLCALC 47 12/30/2018   TRIG 161 (H) 12/30/2018   CHOLHDL 3.5 12/30/2018     3. Essential hypertension No c/o chest pain, sob or headache. Does not check blood pressure a home. BP Readings from Last 3 Encounters:  04/11/19 (!) 97/51  01/20/19 (!) 143/75  12/30/18 136/74     4. Gastroesophageal reflux disease without esophagitis Is on omerazole daily and is doing well.  5. BMI 29.0-29.9,adult No recent weight changes      Past Surgical History:  Procedure Laterality Date  . COLONOSCOPY  12/01/2013  . [place]  . POLYPECTOMY    . UPPER GASTROINTESTINAL ENDOSCOPY      Family History  Problem Relation Age of Onset  . Diabetes Mother   . Hypertension Mother   . Stroke Mother 42  . Diabetes Father   . Hypertension Father   . Heart attack Father 22  . Colon cancer Neg Hx   . Colon polyps Neg Hx   . Esophageal cancer Neg Hx   . Stomach cancer Neg Hx   . Rectal cancer Neg Hx     New complaints: None today  Social history: Lives by hisself but has a girlfriend that he spends a lot of time with.  Controlled substance contract: n/a     Review of Systems  Constitutional: Negative for activity change and appetite change.  HENT: Negative.   Eyes: Negative for pain.  Respiratory: Negative for shortness of breath.    Cardiovascular: Negative for chest pain, palpitations and leg swelling.  Gastrointestinal: Negative for abdominal pain.  Endocrine: Negative for polydipsia.  Genitourinary: Negative.   Skin: Negative for rash.  Neurological: Negative for dizziness, weakness and headaches.  Hematological: Does not bruise/bleed easily.  Psychiatric/Behavioral: Negative.   All other systems reviewed and are negative.      Objective:   Physical Exam Vitals signs and nursing note reviewed.  Constitutional:      Appearance: Normal appearance. He is well-developed.  HENT:     Head: Normocephalic.     Nose: Nose normal.  Eyes:     Pupils: Pupils are equal, round, and reactive to light.  Neck:     Musculoskeletal: Normal range of motion and neck supple.     Thyroid: No thyroid mass or thyromegaly.     Vascular: No carotid bruit or JVD.     Trachea: Phonation normal.  Cardiovascular:     Rate and Rhythm: Normal rate and regular rhythm.  Pulmonary:     Effort: Pulmonary effort is normal. No respiratory distress.     Breath sounds: Normal breath sounds.  Abdominal:     General: Bowel sounds are normal.     Palpations: Abdomen is soft.     Tenderness: There is no abdominal tenderness.  Musculoskeletal: Normal range of motion.  Lymphadenopathy:     Cervical: No cervical adenopathy.  Skin:    General: Skin is warm and dry.  Neurological:     Mental Status: He is alert and oriented to person, place, and time.  Psychiatric:        Behavior: Behavior normal.        Thought Content: Thought content normal.        Judgment: Judgment normal.    BP (!) 97/51   Pulse 71   Temp (!) 97.3 F (36.3 C) (Oral)   Ht '6\' 1"'  (1.854 m)   Wt 218 lb (98.9 kg)   BMI 28.76 kg/m    hgba1c 7.3     Assessment & Plan:  Wayne Wicklund comes in today with chief complaint of Medical Management of Chronic Issues   Diagnosis and orders addressed:  1. Type 2 diabetes mellitus without complication, without  long-term current use of insulin (HCC) Continue carb counting - Bayer DCA Hb A1c Waived - Microalbumin / creatinine urine ratio - metFORMIN (GLUCOPHAGE) 1000 MG tablet; Take 1 tablet (1,000 mg total) by mouth 2 (two) times daily with a meal.  Dispense: 180 tablet; Refill: 1  2. Pure hypercholesterolemia Low fat diet - Lipid panel - rosuvastatin (CRESTOR) 10 MG tablet; TAKE 1 TABLET (10 MG TOTAL) BY MOUTH DAILY.  Dispense: 90 tablet; Refill: 1  3. Essential hypertension Low sodium diet - CMP14+EGFR - benazepril (LOTENSIN) 40 MG tablet; Take 1 tablet (40 mg total) by mouth daily.  Dispense: 90 tablet; Refill: 1 - hydrochlorothiazide (HYDRODIURIL) 25 MG tablet; Take 1 tablet (25 mg total) by mouth daily.  Dispense: 90 tablet; Refill: 1 - amLODipine (NORVASC) 10 MG tablet; Take 1 tablet (10 mg total) by mouth daily.  Dispense: 90 tablet; Refill: 1  4. Gastroesophageal reflux disease without esophagitis Avoid spicy foods Do not eat 2 hours prior to bedtime - omeprazole (PRILOSEC) 40 MG capsule; TAKE ONE CAPSULE BY MOUTH ONCE DAILY BEFORE BREAKFAST  Dispense: 90 capsule; Refill: 1  5. BMI 29.0-29.9,adult Discussed diet and exercise for person with BMI >25 Will recheck weight in 3-6 months    Labs pending Health Maintenance reviewed Diet and exercise encouraged  Follow up plan: 3 months   Mary-Margaret Hassell Done, FNP

## 2019-04-12 LAB — MICROALBUMIN / CREATININE URINE RATIO
Creatinine, Urine: 193.5 mg/dL
Microalb/Creat Ratio: 6 mg/g creat (ref 0–29)
Microalbumin, Urine: 11.9 ug/mL

## 2019-04-12 LAB — CMP14+EGFR
ALT: 18 IU/L (ref 0–44)
AST: 18 IU/L (ref 0–40)
Albumin/Globulin Ratio: 2 (ref 1.2–2.2)
Albumin: 4.6 g/dL (ref 3.7–4.7)
Alkaline Phosphatase: 56 IU/L (ref 39–117)
BUN/Creatinine Ratio: 18 (ref 10–24)
BUN: 20 mg/dL (ref 8–27)
Bilirubin Total: 1.7 mg/dL — ABNORMAL HIGH (ref 0.0–1.2)
CO2: 24 mmol/L (ref 20–29)
Calcium: 10 mg/dL (ref 8.6–10.2)
Chloride: 103 mmol/L (ref 96–106)
Creatinine, Ser: 1.09 mg/dL (ref 0.76–1.27)
GFR calc Af Amer: 79 mL/min/{1.73_m2} (ref >59)
GFR calc non Af Amer: 68 mL/min/{1.73_m2} (ref >59)
Globulin, Total: 2.3 g/dL (ref 1.5–4.5)
Glucose: 135 mg/dL — ABNORMAL HIGH (ref 65–99)
Potassium: 4.8 mmol/L (ref 3.5–5.2)
Sodium: 141 mmol/L (ref 134–144)
Total Protein: 6.9 g/dL (ref 6.0–8.5)

## 2019-04-12 LAB — LIPID PANEL
Chol/HDL Ratio: 3.3 ratio (ref 0.0–5.0)
Cholesterol, Total: 101 mg/dL (ref 100–199)
HDL: 31 mg/dL — ABNORMAL LOW (ref >39)
LDL Chol Calc (NIH): 46 mg/dL (ref 0–99)
Triglycerides: 135 mg/dL (ref 0–149)
VLDL Cholesterol Cal: 24 mg/dL (ref 5–40)

## 2019-05-20 ENCOUNTER — Other Ambulatory Visit: Payer: Self-pay

## 2019-05-23 ENCOUNTER — Other Ambulatory Visit: Payer: Self-pay

## 2019-05-23 ENCOUNTER — Ambulatory Visit (INDEPENDENT_AMBULATORY_CARE_PROVIDER_SITE_OTHER): Payer: Medicare HMO

## 2019-05-23 DIAGNOSIS — Z23 Encounter for immunization: Secondary | ICD-10-CM | POA: Diagnosis not present

## 2019-06-07 DIAGNOSIS — H02834 Dermatochalasis of left upper eyelid: Secondary | ICD-10-CM | POA: Diagnosis not present

## 2019-06-07 DIAGNOSIS — H02831 Dermatochalasis of right upper eyelid: Secondary | ICD-10-CM | POA: Diagnosis not present

## 2019-06-07 DIAGNOSIS — H18413 Arcus senilis, bilateral: Secondary | ICD-10-CM | POA: Diagnosis not present

## 2019-06-07 DIAGNOSIS — Z961 Presence of intraocular lens: Secondary | ICD-10-CM | POA: Diagnosis not present

## 2019-06-07 LAB — HM DIABETES EYE EXAM

## 2019-07-11 ENCOUNTER — Other Ambulatory Visit: Payer: Self-pay

## 2019-07-12 ENCOUNTER — Encounter: Payer: Self-pay | Admitting: Nurse Practitioner

## 2019-07-12 ENCOUNTER — Ambulatory Visit (INDEPENDENT_AMBULATORY_CARE_PROVIDER_SITE_OTHER): Payer: Medicare HMO | Admitting: Nurse Practitioner

## 2019-07-12 VITALS — BP 124/71 | HR 69 | Temp 98.4°F | Resp 20 | Ht 73.0 in | Wt 222.0 lb

## 2019-07-12 DIAGNOSIS — E7800 Pure hypercholesterolemia, unspecified: Secondary | ICD-10-CM

## 2019-07-12 DIAGNOSIS — I10 Essential (primary) hypertension: Secondary | ICD-10-CM

## 2019-07-12 DIAGNOSIS — Z6829 Body mass index (BMI) 29.0-29.9, adult: Secondary | ICD-10-CM | POA: Diagnosis not present

## 2019-07-12 DIAGNOSIS — E119 Type 2 diabetes mellitus without complications: Secondary | ICD-10-CM

## 2019-07-12 DIAGNOSIS — K219 Gastro-esophageal reflux disease without esophagitis: Secondary | ICD-10-CM | POA: Diagnosis not present

## 2019-07-12 LAB — BAYER DCA HB A1C WAIVED: HB A1C (BAYER DCA - WAIVED): 7 % — ABNORMAL HIGH (ref <7.0)

## 2019-07-12 MED ORDER — METFORMIN HCL 1000 MG PO TABS: 1000.0000 mg | ORAL_TABLET | Freq: Two times a day (BID) | ORAL | 1 refills | Status: DC

## 2019-07-12 MED ORDER — HYDROCHLOROTHIAZIDE 25 MG PO TABS: 25.0000 mg | ORAL_TABLET | Freq: Every day | ORAL | 1 refills | Status: DC

## 2019-07-12 MED ORDER — BENAZEPRIL HCL 40 MG PO TABS: 40.0000 mg | ORAL_TABLET | Freq: Every day | ORAL | 1 refills | Status: DC

## 2019-07-12 MED ORDER — AMLODIPINE BESYLATE 10 MG PO TABS: 10.0000 mg | ORAL_TABLET | Freq: Every day | ORAL | 1 refills | Status: DC

## 2019-07-12 MED ORDER — ROSUVASTATIN CALCIUM 10 MG PO TABS: ORAL_TABLET | ORAL | 1 refills | Status: DC

## 2019-07-12 MED ORDER — OMEPRAZOLE 40 MG PO CPDR: DELAYED_RELEASE_CAPSULE | ORAL | 1 refills | Status: DC

## 2019-07-12 NOTE — Patient Instructions (Signed)
Exercising to Stay Healthy To become healthy and stay healthy, it is recommended that you do moderate-intensity and vigorous-intensity exercise. You can tell that you are exercising at a moderate intensity if your heart starts beating faster and you start breathing faster but can still hold a conversation. You can tell that you are exercising at a vigorous intensity if you are breathing much harder and faster and cannot hold a conversation while exercising. Exercising regularly is important. It has many health benefits, such as:  Improving overall fitness, flexibility, and endurance.  Increasing bone density.  Helping with weight control.  Decreasing body fat.  Increasing muscle strength.  Reducing stress and tension.  Improving overall health. How often should I exercise? Choose an activity that you enjoy, and set realistic goals. Your health care provider can help you make an activity plan that works for you. Exercise regularly as told by your health care provider. This may include:  Doing strength training two times a week, such as: ? Lifting weights. ? Using resistance bands. ? Push-ups. ? Sit-ups. ? Yoga.  Doing a certain intensity of exercise for a given amount of time. Choose from these options: ? A total of 150 minutes of moderate-intensity exercise every week. ? A total of 75 minutes of vigorous-intensity exercise every week. ? A mix of moderate-intensity and vigorous-intensity exercise every week. Children, pregnant women, people who have not exercised regularly, people who are overweight, and older adults may need to talk with a health care provider about what activities are safe to do. If you have a medical condition, be sure to talk with your health care provider before you start a new exercise program. What are some exercise ideas? Moderate-intensity exercise ideas include:  Walking 1 mile (1.6 km) in about 15 minutes.  Biking.  Hiking.  Golfing.  Dancing.   Water aerobics. Vigorous-intensity exercise ideas include:  Walking 4.5 miles (7.2 km) or more in about 1 hour.  Jogging or running 5 miles (8 km) in about 1 hour.  Biking 10 miles (16.1 km) or more in about 1 hour.  Lap swimming.  Roller-skating or in-line skating.  Cross-country skiing.  Vigorous competitive sports, such as football, basketball, and soccer.  Jumping rope.  Aerobic dancing. What are some everyday activities that can help me to get exercise?  Yard work, such as: ? Pushing a lawn mower. ? Raking and bagging leaves.  Washing your car.  Pushing a stroller.  Shoveling snow.  Gardening.  Washing windows or floors. How can I be more active in my day-to-day activities?  Use stairs instead of an elevator.  Take a walk during your lunch break.  If you drive, park your car farther away from your work or school.  If you take public transportation, get off one stop early and walk the rest of the way.  Stand up or walk around during all of your indoor phone calls.  Get up, stretch, and walk around every 30 minutes throughout the day.  Enjoy exercise with a friend. Support to continue exercising will help you keep a regular routine of activity. What guidelines can I follow while exercising?  Before you start a new exercise program, talk with your health care provider.  Do not exercise so much that you hurt yourself, feel dizzy, or get very short of breath.  Wear comfortable clothes and wear shoes with good support.  Drink plenty of water while you exercise to prevent dehydration or heat stroke.  Work out until your breathing   and your heartbeat get faster. Where to find more information  U.S. Department of Health and Human Services: www.hhs.gov  Centers for Disease Control and Prevention (CDC): www.cdc.gov Summary  Exercising regularly is important. It will improve your overall fitness, flexibility, and endurance.  Regular exercise also will  improve your overall health. It can help you control your weight, reduce stress, and improve your bone density.  Do not exercise so much that you hurt yourself, feel dizzy, or get very short of breath.  Before you start a new exercise program, talk with your health care provider. This information is not intended to replace advice given to you by your health care provider. Make sure you discuss any questions you have with your health care provider. Document Released: 08/30/2010 Document Revised: 07/10/2017 Document Reviewed: 06/18/2017 Elsevier Patient Education  2020 Elsevier Inc.  

## 2019-07-12 NOTE — Progress Notes (Signed)
Subjective:    Patient ID: Jorge Garcia, male    DOB: 1946/07/19, 73 y.o.   MRN: [id number]   Chief Complaint: Medical Management of Chronic Issues    HPI:  1. Type 2 diabetes mellitus without complication, without long-term current use of insulin (HCC) Fasting blood sugars are running below 150. He denies any low blood sugars. Lab Results  Component Value Date   HGBA1C 7.3 (H) 04/11/2019     2. Pure hypercholesterolemia Does not watch diet and does very little exercise. Lab Results  Component Value Date   CHOL 101 04/11/2019   HDL 31 (L) 04/11/2019   LDLCALC 46 04/11/2019   TRIG 135 04/11/2019   CHOLHDL 3.3 04/11/2019     3. Essential hypertension No c/o chest pain, sob or headache. Does not check blood pressure at home. BP Readings from Last 3 Encounters:  04/11/19 (!) 97/51  01/20/19 (!) 143/75  12/30/18 136/74    4. Gastroesophageal reflux disease without esophagitis Takes omeprazole daily and works well to keep symptoms under control  5. BMI 29.0-29.9,adult Weight is up 4lbs from previous visit Wt Readings from Last 3 Encounters:  07/12/19 222 lb (100.7 kg)  04/11/19 218 lb (98.9 kg)  01/20/19 227 lb (103 kg)   BMI Readings from Last 3 Encounters:  07/12/19 29.29 kg/m  04/11/19 28.76 kg/m  01/20/19 29.95 kg/m        Outpatient Encounter Medications as of 07/12/2019  Medication Sig  . ACCU-CHEK AVIVA PLUS test strip USE 1 STRIP TO CHECK GLUCOSE 4 TIMES DAILY  . ACCU-CHEK SOFTCLIX LANCETS lancets USE 1  TO CHECK GLUCOSE 4 TIMES DAILY  . amLODipine (NORVASC) 10 MG tablet Take 1 tablet (10 mg total) by mouth daily.  Marland Kitchen aspirin EC 81 MG tablet Take 81 mg by mouth daily.  . benazepril (LOTENSIN) 40 MG tablet Take 1 tablet (40 mg total) by mouth daily.  . cholecalciferol (VITAMIN D) 1000 UNITS tablet Take 2,000 Units by mouth daily.   . fish oil-omega-3 fatty acids 1000 MG capsule Take 1 g by mouth daily.    . hydrochlorothiazide (HYDRODIURIL) 25  MG tablet Take 1 tablet (25 mg total) by mouth daily.  Marland Kitchen ibuprofen (ADVIL) 400 MG tablet Take 1 tablet (400 mg total) by mouth every 6 (six) hours as needed.  . metFORMIN (GLUCOPHAGE) 1000 MG tablet Take 1 tablet (1,000 mg total) by mouth 2 (two) times daily with a meal.  . omeprazole (PRILOSEC) 40 MG capsule TAKE ONE CAPSULE BY MOUTH ONCE DAILY BEFORE BREAKFAST  . rosuvastatin (CRESTOR) 10 MG tablet TAKE 1 TABLET (10 MG TOTAL) BY MOUTH DAILY.     Past Surgical History:  Procedure Laterality Date  . COLONOSCOPY  12/01/2013  . [place]  . POLYPECTOMY    . UPPER GASTROINTESTINAL ENDOSCOPY      Family History  Problem Relation Age of Onset  . Diabetes Mother   . Hypertension Mother   . Stroke Mother 52  . Diabetes Father   . Hypertension Father   . Heart attack Father 67  . Colon cancer Neg Hx   . Colon polyps Neg Hx   . Esophageal cancer Neg Hx   . Stomach cancer Neg Hx   . Rectal cancer Neg Hx     New complaints: None today  Social history: Lives by hisself  Controlled substance contract: n/a    Review of Systems  Constitutional: Negative for activity change and appetite change.  HENT: Negative.  Eyes: Negative for pain.  Respiratory: Negative for shortness of breath.   Cardiovascular: Negative for chest pain, palpitations and leg swelling.  Gastrointestinal: Negative for abdominal pain.  Endocrine: Negative for polydipsia.  Genitourinary: Negative.   Skin: Negative for rash.  Neurological: Negative for dizziness, weakness and headaches.  Hematological: Does not bruise/bleed easily.  Psychiatric/Behavioral: Negative.   All other systems reviewed and are negative.      Objective:   Physical Exam Vitals signs and nursing note reviewed.  Constitutional:      Appearance: Normal appearance. He is well-developed.  HENT:     Head: Normocephalic.     Nose: Nose normal.  Eyes:     Pupils: Pupils are equal, round, and reactive to light.  Neck:      Musculoskeletal: Normal range of motion and neck supple.     Thyroid: No thyroid mass or thyromegaly.     Vascular: No carotid bruit or JVD.     Trachea: Phonation normal.  Cardiovascular:     Rate and Rhythm: Normal rate and regular rhythm.  Pulmonary:     Effort: Pulmonary effort is normal. No respiratory distress.     Breath sounds: Normal breath sounds.  Abdominal:     General: Bowel sounds are normal.     Palpations: Abdomen is soft.     Tenderness: There is no abdominal tenderness.  Musculoskeletal: Normal range of motion.  Lymphadenopathy:     Cervical: No cervical adenopathy.  Skin:    General: Skin is warm and dry.  Neurological:     Mental Status: He is alert and oriented to person, place, and time.  Psychiatric:        Behavior: Behavior normal.        Thought Content: Thought content normal.        Judgment: Judgment normal.    BP 124/71   Pulse 69   Temp 98.4 F (36.9 C) (Temporal)   Resp 20   Ht '6\' 1"'$  (1.854 m)   Wt 222 lb (100.7 kg)   SpO2 94%   BMI 29.29 kg/m   hgba1c 7.0%      Assessment & Plan:  Dio Giller comes in today with chief complaint of Medical Management of Chronic Issues   Diagnosis and orders addressed:  1. Type 2 diabetes mellitus without complication, without long-term current use of insulin (HCC) Continue to watch carbs in diet - hgba1c - metFORMIN (GLUCOPHAGE) 1000 MG tablet; Take 1 tablet (1,000 mg total) by mouth 2 (two) times daily with a meal.  Dispense: 180 tablet; Refill: 1  2. Pure hypercholesterolemia Low fta diet - Lipid panel - rosuvastatin (CRESTOR) 10 MG tablet; TAKE 1 TABLET (10 MG TOTAL) BY MOUTH DAILY.  Dispense: 90 tablet; Refill: 1  3. Essential hypertension Low sodium diet - CMP14+EGFR - benazepril (LOTENSIN) 40 MG tablet; Take 1 tablet (40 mg total) by mouth daily.  Dispense: 90 tablet; Refill: 1 - hydrochlorothiazide (HYDRODIURIL) 25 MG tablet; Take 1 tablet (25 mg total) by mouth daily.   Dispense: 90 tablet; Refill: 1 - amLODipine (NORVASC) 10 MG tablet; Take 1 tablet (10 mg total) by mouth daily.  Dispense: 90 tablet; Refill: 1  4. Gastroesophageal reflux disease without esophagitis Avoid spicy foods Do not eat 2 hours prior to bedtime - omeprazole (PRILOSEC) 40 MG capsule; TAKE ONE CAPSULE BY MOUTH ONCE DAILY BEFORE BREAKFAST  Dispense: 90 capsule; Refill: 1  5. BMI 29.0-29.9,adult Discussed diet and exercise for person with BMI >25 Will recheck weight  in 3-6 months   Labs pending Health Maintenance reviewed Diet and exercise encouraged  Follow up plan: 3 months   Mary-Margaret Hassell Done, FNP

## 2019-07-13 LAB — LIPID PANEL
Chol/HDL Ratio: 3 ratio (ref 0.0–5.0)
Cholesterol, Total: 107 mg/dL (ref 100–199)
HDL: 36 mg/dL — ABNORMAL LOW (ref >39)
LDL Chol Calc (NIH): 49 mg/dL (ref 0–99)
Triglycerides: 119 mg/dL (ref 0–149)
VLDL Cholesterol Cal: 22 mg/dL (ref 5–40)

## 2019-07-13 LAB — CMP14+EGFR
ALT: 27 IU/L (ref 0–44)
AST: 19 IU/L (ref 0–40)
Albumin/Globulin Ratio: 1.9 (ref 1.2–2.2)
Albumin: 4.6 g/dL (ref 3.7–4.7)
Alkaline Phosphatase: 61 IU/L (ref 39–117)
BUN/Creatinine Ratio: 18 (ref 10–24)
BUN: 19 mg/dL (ref 8–27)
Bilirubin Total: 2.3 mg/dL — ABNORMAL HIGH (ref 0.0–1.2)
CO2: 28 mmol/L (ref 20–29)
Calcium: 9.7 mg/dL (ref 8.6–10.2)
Chloride: 103 mmol/L (ref 96–106)
Creatinine, Ser: 1.07 mg/dL (ref 0.76–1.27)
GFR calc Af Amer: 80 mL/min/{1.73_m2} (ref >59)
GFR calc non Af Amer: 69 mL/min/{1.73_m2} (ref >59)
Globulin, Total: 2.4 g/dL (ref 1.5–4.5)
Glucose: 122 mg/dL — ABNORMAL HIGH (ref 65–99)
Potassium: 4.6 mmol/L (ref 3.5–5.2)
Sodium: 143 mmol/L (ref 134–144)
Total Protein: 7 g/dL (ref 6.0–8.5)

## 2019-08-18 DIAGNOSIS — M79676 Pain in unspecified toe(s): Secondary | ICD-10-CM | POA: Diagnosis not present

## 2019-08-18 DIAGNOSIS — B351 Tinea unguium: Secondary | ICD-10-CM | POA: Diagnosis not present

## 2019-08-18 DIAGNOSIS — E1142 Type 2 diabetes mellitus with diabetic polyneuropathy: Secondary | ICD-10-CM | POA: Diagnosis not present

## 2019-08-18 DIAGNOSIS — L84 Corns and callosities: Secondary | ICD-10-CM | POA: Diagnosis not present

## 2019-10-12 ENCOUNTER — Other Ambulatory Visit: Payer: Self-pay

## 2019-10-13 ENCOUNTER — Ambulatory Visit (INDEPENDENT_AMBULATORY_CARE_PROVIDER_SITE_OTHER): Payer: Medicare HMO | Admitting: Nurse Practitioner

## 2019-10-13 ENCOUNTER — Encounter: Payer: Self-pay | Admitting: Nurse Practitioner

## 2019-10-13 VITALS — BP 117/69 | HR 72 | Temp 98.6°F | Resp 20 | Ht 73.0 in | Wt 213.0 lb

## 2019-10-13 DIAGNOSIS — Z6829 Body mass index (BMI) 29.0-29.9, adult: Secondary | ICD-10-CM | POA: Diagnosis not present

## 2019-10-13 DIAGNOSIS — E7800 Pure hypercholesterolemia, unspecified: Secondary | ICD-10-CM | POA: Diagnosis not present

## 2019-10-13 DIAGNOSIS — E119 Type 2 diabetes mellitus without complications: Secondary | ICD-10-CM

## 2019-10-13 DIAGNOSIS — Z87891 Personal history of nicotine dependence: Secondary | ICD-10-CM

## 2019-10-13 DIAGNOSIS — I10 Essential (primary) hypertension: Secondary | ICD-10-CM

## 2019-10-13 DIAGNOSIS — Z125 Encounter for screening for malignant neoplasm of prostate: Secondary | ICD-10-CM

## 2019-10-13 DIAGNOSIS — H6123 Impacted cerumen, bilateral: Secondary | ICD-10-CM

## 2019-10-13 DIAGNOSIS — K219 Gastro-esophageal reflux disease without esophagitis: Secondary | ICD-10-CM | POA: Diagnosis not present

## 2019-10-13 LAB — BAYER DCA HB A1C WAIVED: HB A1C (BAYER DCA - WAIVED): 6.9 % (ref <7.0)

## 2019-10-13 NOTE — Patient Instructions (Signed)
Exercise Information for Aging Adults Staying physically active is important as you age. The four types of exercises that are best for older adults are endurance, strength, balance, and flexibility. Contact your health care provider before you start any exercise routine. Ask your health care provider what activities are safe for you. What are the risks? Risks associated with exercising include:  Overdoing it. This may lead to sore muscles or fatigue.  Falls.  Injuries.  Dehydration. How to do these exercises Endurance exercises Endurance (aerobic) exercises raise your breathing rate and heart rate. Increasing your endurance helps you to do everyday tasks and stay healthy. By improving the health of your body system that includes your heart, lungs, and blood vessels (circulatory system), you may also delay or prevent diseases such as heart disease, diabetes, and bone loss (osteoporosis). Types of endurance exercises include:  Sports.  Indoor activities, such as using gym equipment, doing water aerobics, or dancing.  Outdoor activities, such as biking or jogging.  Tasks around the house, such as gardening, yard work, and heavy household chores like cleaning.  Walking, such as hiking or walking around your neighborhood. When doing endurance exercises, make sure you:  Are aware of your surroundings.  Use safety equipment as directed.  Dress in layers when exercising outdoors.  Drink plenty of water to stay well hydrated. Build up endurance slowly. Start with 10 minutes at a time, and gradually build up to doing 30 minutes at a time. Unless your health care provider gave you different instructions, aim to exercise for a total of 150 minutes a week. Spread out that time so you are working on endurance on 3 or more days a week. Strength exercises Lifting, pulling, or pushing weights helps to strengthen muscles. Having stronger muscles makes it easier to do everyday activities, such as  getting up from a chair, climbing stairs, carrying groceries, and playing with grandchildren. Strength exercises include arm and leg exercises that may be done:  With weights.  Without weights (using your own body weight).  With a resistance band. When doing strength exercises:  Move smoothly and steadily. Do not suddenly thrust or jerk the weights, the resistance band, or your body.  Start with no weights or with light weights, and gradually add more weight over time. Eventually, aim to use weights that are hard or very hard for you to lift. This means that you are able to do 8 repetitions with the weight, and the last few repetitions are very challenging.  Lift or push weights into position for 3 seconds, hold the position for 1 second, and then take 3 seconds to return to your starting position.  Breathe out (exhale) during difficult movements, like lifting or pushing weights. Breathe in (inhale) to relax your muscles before the next repetition.  Consider alternating arms or legs, especially when you first start strength exercises.  Expect some slight muscle soreness after each session. Do strength exercises on 2 or more days a week, for 30 minutes at a time. Avoid exercising the same muscle groups two days in a row. For example, if you work on your leg muscles one day, work on your arm muscles the next day. When you can do two sets of 10-15 repetitions with a certain weight, increase the amount of weight. Balance Balance exercises can help to prevent falls. Balance exercises include:  Standing on one foot.  Heel-to-toe walk.  Balance walk.  Tai chi. Make sure you have something sturdy to hold onto while doing  balance exercises, such as a sturdy chair. As your balance improves, challenge yourself by holding onto the chair with one hand instead of two, and then with no hands. Trying exercises with your eyes closed also challenges your balance, but be sure to have a sturdy surface  (like a countertop) close by in case you need it. °Do balance exercises as often as you want, or as often as directed by your health care provider. Strength exercises for the lower body also help to improve balance. °Flexibility ° °Flexibility exercises improve how far you can bend, straighten, move, or rotate parts of your body (range of motion). These exercises also help you to do everyday activities such as getting dressed or reaching for objects. Flexibility exercises include stretching different parts of the body, and they may be done in a standing or seated position or on the floor. °When stretching, make sure you: °· Keep a slight bend in your arms and legs. Avoid completely straightening ("locking") your joints. °· Do not stretch so far that you feel pain. You should feel a mild stretching feeling. You may try stretching farther as you become more flexible over time. °· Relax and breathe between stretches. °· Hold onto something sturdy for balance as needed. °Hold each stretch for 10-30 seconds. Repeat each stretch 3-5 times. °General safety tips °· Exercise in well-lit areas. °· Do not hold your breath during exercises or stretches. °· Warm up before exercising, and cool down after exercising. This can help prevent injury. °· Drink plenty of water during exercise or any activity that makes you sweat. °· Use smooth, steady movements. Do not use sudden, jerking movements, especially when lifting weights or doing flexibility exercises. °· If you are not sure if an exercise is safe for you, or you are not sure how to do an exercise, talk with your health care provider. This is especially important if you have had surgery on muscles, bones, or joints (orthopedic surgery). °Where to find more information °You can find more information about exercise for older adults from: °· Your local health department, fitness center, or community center. These facilities may have programs for aging adults. °· National  Institute on Aging: www.nia.nih.gov °· National Council on Aging: www.ncoa.org °Summary °· Staying physically active is important as you age. °· Make sure to contact your health care provider before you start any exercise routine. Ask your health care provider what activities are safe for you. °· Doing endurance, strength, balance, and flexibility exercises can help to delay or prevent certain diseases, such as heart disease, diabetes, and bone loss (osteoporosis). °This information is not intended to replace advice given to you by your health care provider. Make sure you discuss any questions you have with your health care provider. °Document Revised: 05/20/2018 Document Reviewed: 12/17/2016 °Elsevier Patient Education © 2020 [employer] ° °

## 2019-10-13 NOTE — Progress Notes (Signed)
Subjective:    Patient ID: Jorge Garcia, male    DOB: 1945/09/23, 74 y.o.   MRN: [id number]   Chief Complaint: Medical Management of Chronic Issues    HPI:  1. Type 2 diabetes mellitus without complication, without long-term current use of insulin ([place]) Checks blood sugars at home every morning. Runs from 115-130s. Watching carb intake. Denies numbness, tingling in feet. Denies changes in vision. Last eye exam September 2020.   Lab Results  Component Value Date   HGBA1C 7.0 (H) 07/12/2019     2. Pure hypercholesterolemia Watches cholesterol intake at home. Is active at home but no regular exercise.  Lab Results  Component Value Date   CHOL 107 07/12/2019   HDL 36 (L) 07/12/2019   LDLCALC 49 07/12/2019   TRIG 119 07/12/2019   CHOLHDL 3.0 07/12/2019     3. Essential hypertension Watches sodium intake. Denies chest pain, SOB, headaches. Occasionally checks BP at home. BP Readings from Last 3 Encounters:  10/13/19 117/69  07/12/19 124/71  04/11/19 (!) 97/51     4. Prostate cancer screening Denies problems with urination.  Lab Results  Component Value Date   PSA1 1.4 08/19/2016   PSA 1.8 05/27/2013      5. Gastroesophageal reflux disease without esophagitis Denies symptoms of heartburn.  6. BMI 29.0-29.9,adult No significant changes in weight. Has lost 9 pounds since last visit. BMI Readings from Last 3 Encounters:  10/13/19 28.10 kg/m  07/12/19 29.29 kg/m  04/11/19 28.76 kg/m   Wt Readings from Last 3 Encounters:  10/13/19 213 lb (96.6 kg)  07/12/19 222 lb (100.7 kg)  04/11/19 218 lb (98.9 kg)       Outpatient Encounter Medications as of 10/13/2019  Medication Sig  . ACCU-CHEK AVIVA PLUS test strip USE 1 STRIP TO CHECK GLUCOSE 4 TIMES DAILY  . ACCU-CHEK SOFTCLIX LANCETS lancets USE 1  TO CHECK GLUCOSE 4 TIMES DAILY  . amLODipine (NORVASC) 10 MG tablet Take 1 tablet (10 mg total) by mouth daily.  Marland Kitchen aspirin EC 81 MG tablet Take 81 mg by mouth  daily.  . benazepril (LOTENSIN) 40 MG tablet Take 1 tablet (40 mg total) by mouth daily.  . cholecalciferol (VITAMIN D) 1000 UNITS tablet Take 2,000 Units by mouth daily.   . fish oil-omega-3 fatty acids 1000 MG capsule Take 1 g by mouth daily.    . hydrochlorothiazide (HYDRODIURIL) 25 MG tablet Take 1 tablet (25 mg total) by mouth daily.  Marland Kitchen ibuprofen (ADVIL) 400 MG tablet Take 1 tablet (400 mg total) by mouth every 6 (six) hours as needed.  . metFORMIN (GLUCOPHAGE) 1000 MG tablet Take 1 tablet (1,000 mg total) by mouth 2 (two) times daily with a meal.  . omeprazole (PRILOSEC) 40 MG capsule TAKE ONE CAPSULE BY MOUTH ONCE DAILY BEFORE BREAKFAST  . rosuvastatin (CRESTOR) 10 MG tablet TAKE 1 TABLET (10 MG TOTAL) BY MOUTH DAILY.   No facility-administered encounter medications on file as of 10/13/2019.    Past Surgical History:  Procedure Laterality Date  . COLONOSCOPY  12/01/2013  . [place]  . POLYPECTOMY    . UPPER GASTROINTESTINAL ENDOSCOPY      Family History  Problem Relation Age of Onset  . Diabetes Mother   . Hypertension Mother   . Stroke Mother 67  . Diabetes Father   . Hypertension Father   . Heart attack Father 96  . Colon cancer Neg Hx   . Colon polyps Neg Hx   .  Esophageal cancer Neg Hx   . Stomach cancer Neg Hx   . Rectal cancer Neg Hx     New complaints: None  Social history: Lives alone. No children. Has girlfriend that helps take care of him.  Controlled substance contract: N/A     Review of Systems  Constitutional: Negative.   HENT: Negative.   Eyes: Negative.   Respiratory: Negative.   Cardiovascular: Negative.   Gastrointestinal: Negative.   Endocrine: Negative.   Genitourinary: Negative.   Musculoskeletal: Negative.   Skin: Negative.   Allergic/Immunologic: Negative.   Neurological: Negative.   Hematological: Negative.   Psychiatric/Behavioral: Negative.        Objective:   Physical Exam Vitals and nursing note  reviewed.  Constitutional:      Appearance: Normal appearance. He is normal weight.  HENT:     Head: Normocephalic.     Right Ear: There is impacted cerumen.     Left Ear: There is impacted cerumen.     Nose: Nose normal.     Mouth/Throat:     Mouth: Mucous membranes are moist.     Pharynx: Oropharynx is clear.  Eyes:     Conjunctiva/sclera: Conjunctivae normal.     Pupils: Pupils are equal, round, and reactive to light.  Cardiovascular:     Rate and Rhythm: Normal rate and regular rhythm.     Pulses: Normal pulses.     Heart sounds: Normal heart sounds.  Pulmonary:     Effort: Pulmonary effort is normal.     Breath sounds: Normal breath sounds.  Abdominal:     General: Abdomen is flat. Bowel sounds are normal.     Palpations: Abdomen is soft.  Musculoskeletal:        General: Normal range of motion.     Cervical back: Normal range of motion and neck supple.  Skin:    General: Skin is warm and dry.     Capillary Refill: Capillary refill takes less than 2 seconds.  Neurological:     General: No focal deficit present.     Mental Status: He is alert and oriented to person, place, and time.  Psychiatric:        Mood and Affect: Mood normal.        Behavior: Behavior normal.     BP 117/69   Pulse 72   Temp 98.6 F (37 C) (Temporal)   Resp 20   Ht '6\' 1"'  (1.854 m)   Wt 213 lb (96.6 kg)   SpO2 95%   BMI 28.10 kg/m   S/P ear irrigation- TM's clear bil     Assessment & Plan:   Smaran Gaus comes in today with chief complaint of Medical Management of Chronic Issues   Diagnosis and orders addressed:  1. Type 2 diabetes mellitus without complication, without long-term current use of insulin (HCC) Continue to count carbs at home. Inspect feet regularly. Check blood sugars at home. - Bayer DCA Hb A1c Waived  2. Pure hypercholesterolemia Watch cholesterol intake. Weight-bearing activity as tolerated. - Lipid panel  3. Essential hypertension Low sodium diet. -  CBC with Differential/Platelet - CMP14+EGFR  4. Prostate cancer screening - PSA, total and free  5. Gastroesophageal reflux disease without esophagitis Avoid spicy foods Do not eat 2 hours prior to bedtime   6. BMI 29.0-29.9,adult Discussed diet and exercise for person with BMI >25 Will recheck weight in 3-6 months  7. Cerumen Impaction Debrox OTC 2-3 x a week Do not use qtips in  ears  Labs pending Health Maintenance reviewed Diet and exercise encouraged  Follow up plan: 3 months   Mary-Margaret Hassell Done, FNP

## 2019-10-14 ENCOUNTER — Other Ambulatory Visit (HOSPITAL_COMMUNITY): Payer: Self-pay | Admitting: *Deleted

## 2019-10-14 ENCOUNTER — Encounter (HOSPITAL_COMMUNITY): Payer: Self-pay | Admitting: *Deleted

## 2019-10-14 DIAGNOSIS — Z87891 Personal history of nicotine dependence: Secondary | ICD-10-CM

## 2019-10-14 DIAGNOSIS — Z122 Encounter for screening for malignant neoplasm of respiratory organs: Secondary | ICD-10-CM

## 2019-10-14 LAB — CBC WITH DIFFERENTIAL/PLATELET
Basophils Absolute: 0 10*3/uL (ref 0.0–0.2)
Basos: 1 %
EOS (ABSOLUTE): 0.3 10*3/uL (ref 0.0–0.4)
Eos: 3 %
Hematocrit: 42.6 % (ref 37.5–51.0)
Hemoglobin: 14.8 g/dL (ref 13.0–17.7)
Immature Grans (Abs): 0 10*3/uL (ref 0.0–0.1)
Immature Granulocytes: 0 %
Lymphocytes Absolute: 1.4 10*3/uL (ref 0.7–3.1)
Lymphs: 18 %
MCH: 28.7 pg (ref 26.6–33.0)
MCHC: 34.7 g/dL (ref 31.5–35.7)
MCV: 83 fL (ref 79–97)
Monocytes Absolute: 0.7 10*3/uL (ref 0.1–0.9)
Monocytes: 9 %
Neutrophils Absolute: 5.4 10*3/uL (ref 1.4–7.0)
Neutrophils: 69 %
Platelets: 262 10*3/uL (ref 150–450)
RBC: 5.16 x10E6/uL (ref 4.14–5.80)
RDW: 13.6 % (ref 11.6–15.4)
WBC: 7.9 10*3/uL (ref 3.4–10.8)

## 2019-10-14 LAB — CMP14+EGFR
ALT: 17 IU/L (ref 0–44)
AST: 19 IU/L (ref 0–40)
Albumin/Globulin Ratio: 2.3 — ABNORMAL HIGH (ref 1.2–2.2)
Albumin: 4.8 g/dL — ABNORMAL HIGH (ref 3.7–4.7)
Alkaline Phosphatase: 56 IU/L (ref 39–117)
BUN/Creatinine Ratio: 17 (ref 10–24)
BUN: 19 mg/dL (ref 8–27)
Bilirubin Total: 1.8 mg/dL — ABNORMAL HIGH (ref 0.0–1.2)
CO2: 22 mmol/L (ref 20–29)
Calcium: 10.1 mg/dL (ref 8.6–10.2)
Chloride: 103 mmol/L (ref 96–106)
Creatinine, Ser: 1.11 mg/dL (ref 0.76–1.27)
GFR calc Af Amer: 77 mL/min/{1.73_m2} (ref >59)
GFR calc non Af Amer: 66 mL/min/{1.73_m2} (ref >59)
Globulin, Total: 2.1 g/dL (ref 1.5–4.5)
Glucose: 117 mg/dL — ABNORMAL HIGH (ref 65–99)
Potassium: 4.1 mmol/L (ref 3.5–5.2)
Sodium: 141 mmol/L (ref 134–144)
Total Protein: 6.9 g/dL (ref 6.0–8.5)

## 2019-10-14 LAB — LIPID PANEL
Chol/HDL Ratio: 2.4 ratio (ref 0.0–5.0)
Cholesterol, Total: 100 mg/dL (ref 100–199)
HDL: 42 mg/dL (ref >39)
LDL Chol Calc (NIH): 41 mg/dL (ref 0–99)
Triglycerides: 85 mg/dL (ref 0–149)
VLDL Cholesterol Cal: 17 mg/dL (ref 5–40)

## 2019-10-14 LAB — PSA, TOTAL AND FREE
PSA, Free Pct: 35.9 %
PSA, Free: 0.79 ng/mL
Prostate Specific Ag, Serum: 2.2 ng/mL (ref 0.0–4.0)

## 2019-10-14 NOTE — Progress Notes (Signed)
Lung screening orders placed

## 2019-10-14 NOTE — Progress Notes (Signed)
Received referral for initial lung cancer screening scan.  Contacted patient and obtained smoking history (started age 74 smoking 1-1.5 packs per day.  He quit smoking in 2007, 58 pack year history) as well as answering questions related to the screening process.  Patient denies signs/symptoms of lung cancer such as weight loss or hemoptysis.  Patient denies comorbidity that would prevent curative treatment if lung cancer were to be found.  Patient is scheduled for shared decision making visit and CT scan on 3/19 at 0950.

## 2019-10-27 DIAGNOSIS — L84 Corns and callosities: Secondary | ICD-10-CM | POA: Diagnosis not present

## 2019-10-27 DIAGNOSIS — M79676 Pain in unspecified toe(s): Secondary | ICD-10-CM | POA: Diagnosis not present

## 2019-10-27 DIAGNOSIS — B351 Tinea unguium: Secondary | ICD-10-CM | POA: Diagnosis not present

## 2019-10-27 DIAGNOSIS — E1142 Type 2 diabetes mellitus with diabetic polyneuropathy: Secondary | ICD-10-CM | POA: Diagnosis not present

## 2019-10-28 ENCOUNTER — Other Ambulatory Visit: Payer: Self-pay

## 2019-10-28 ENCOUNTER — Inpatient Hospital Stay (HOSPITAL_COMMUNITY): Payer: Medicare HMO | Attending: Nurse Practitioner | Admitting: Nurse Practitioner

## 2019-10-28 ENCOUNTER — Encounter (HOSPITAL_COMMUNITY): Payer: Self-pay | Admitting: *Deleted

## 2019-10-28 ENCOUNTER — Ambulatory Visit (HOSPITAL_COMMUNITY)
Admission: RE | Admit: 2019-10-28 | Discharge: 2019-10-28 | Disposition: A | Discharge status: Home / Self Care | Payer: Medicare HMO | Source: Ambulatory Visit | Attending: Nurse Practitioner | Admitting: Nurse Practitioner

## 2019-10-28 DIAGNOSIS — Z122 Encounter for screening for malignant neoplasm of respiratory organs: Secondary | ICD-10-CM | POA: Diagnosis not present

## 2019-10-28 DIAGNOSIS — Z87891 Personal history of nicotine dependence: Secondary | ICD-10-CM | POA: Diagnosis not present

## 2019-10-28 NOTE — Progress Notes (Signed)
[facility] NOTE  Patient Care Team: Jorge Pretty, FNP as PCP - General (Nurse Practitioner)  CHIEF COMPLAINTS/PURPOSE OF CONSULTATION: Shared decision making visit for lung cancer screening  HISTORY OF PRESENTING ILLNESS:  Jorge Garcia 74 y.o. male presents today for shared decision making visit for lung cancer screening.  He has a past medical history significant for CVA, diabetes, hyperlipidemia, and hypertension.  Patient states she started smoking at the age of 78  He has smoked 1 packs of cigarettes a day for the past 55 Years.  He reports occasional cough without sputum production.  He reports shortness of breath on exertion only.  He denies any alcohol or illicit drug use.  He has a family history significant for a paternal uncle and maternal aunt with unknown type of cancer.   MEDICAL HISTORY:  Past Medical History:  Diagnosis Date  . Diabetes mellitus without complication ([place])   . History of CVA (cerebrovascular accident)   . Hyperlipidemia   . Hypertension   . Internal hemorrhoids   . Tubulovillous adenoma of colon 2009  . Vitamin D deficiency     SURGICAL HISTORY: Past Surgical History:  Procedure Laterality Date  . COLONOSCOPY  12/01/2013  . [place]  . POLYPECTOMY    . UPPER GASTROINTESTINAL ENDOSCOPY      SOCIAL HISTORY: Social History   Socioeconomic History  . Marital status: Single    Spouse name: Not on file  . Number of children: 0  . Years of education: 56  . Highest education level: 12th grade  Occupational History  . Occupation: Retired    Fish farm manager: Brewing technologist  Tobacco Use  . Smoking status: Former Smoker    Packs/day: 1.00    Years: 40.00    Pack years: 40.00    Types: Cigarettes    Quit date: 11/09/2005    Years since quitting: 13.9  . Smokeless tobacco: Never Used  Substance and Sexual Activity  . Alcohol use: No  . Drug use: No  . Sexual activity: Not Currently  Other Topics  Concern  . Not on file  Social History Narrative  . Not on file   Social Determinants of Health   Financial Resource Strain:   . Difficulty of Paying Living Expenses:   Food Insecurity:   . Worried About Charity fundraiser in the Last Year:   . Arboriculturist in the Last Year:   Transportation Needs:   . Film/video editor (Medical):   Marland Kitchen Lack of Transportation (Non-Medical):   Physical Activity:   . Days of Exercise per Week:   . Minutes of Exercise per Session:   Stress:   . Feeling of Stress :   Social Connections:   . Frequency of Communication with Friends and Family:   . Frequency of Social Gatherings with Friends and Family:   . Attends Religious Services:   . Active Member of Clubs or Organizations:   . Attends Archivist Meetings:   Marland Kitchen Marital Status:   Intimate Partner Violence:   . Fear of Current or Ex-Partner:   . Emotionally Abused:   Marland Kitchen Physically Abused:   . Sexually Abused:     FAMILY HISTORY: Family History  Problem Relation Age of Onset  . Diabetes Mother   . Hypertension Mother   . Stroke Mother 11  . Diabetes Father   . Hypertension Father   . Heart attack Father 20  . Colon cancer Neg Hx   .  Colon polyps Neg Hx   . Esophageal cancer Neg Hx   . Stomach cancer Neg Hx   . Rectal cancer Neg Hx     ALLERGIES:  has No Known Allergies.  MEDICATIONS:  Current Outpatient Medications  Medication Sig Dispense Refill  . ACCU-CHEK AVIVA PLUS test strip USE 1 STRIP TO CHECK GLUCOSE 4 TIMES DAILY 400 each 3  . ACCU-CHEK SOFTCLIX LANCETS lancets USE 1  TO CHECK GLUCOSE 4 TIMES DAILY 400 each 3  . amLODipine (NORVASC) 10 MG tablet Take 1 tablet (10 mg total) by mouth daily. 90 tablet 1  . aspirin EC 81 MG tablet Take 81 mg by mouth daily.    . benazepril (LOTENSIN) 40 MG tablet Take 1 tablet (40 mg total) by mouth daily. 90 tablet 1  . cholecalciferol (VITAMIN D) 1000 UNITS tablet Take 2,000 Units by mouth daily.     . fish oil-omega-3  fatty acids 1000 MG capsule Take 1 g by mouth daily.      . hydrochlorothiazide (HYDRODIURIL) 25 MG tablet Take 1 tablet (25 mg total) by mouth daily. 90 tablet 1  . ibuprofen (ADVIL) 400 MG tablet Take 1 tablet (400 mg total) by mouth every 6 (six) hours as needed. 30 tablet 0  . metFORMIN (GLUCOPHAGE) 1000 MG tablet Take 1 tablet (1,000 mg total) by mouth 2 (two) times daily with a meal. 180 tablet 1  . omeprazole (PRILOSEC) 40 MG capsule TAKE ONE CAPSULE BY MOUTH ONCE DAILY BEFORE BREAKFAST 90 capsule 1  . rosuvastatin (CRESTOR) 10 MG tablet TAKE 1 TABLET (10 MG TOTAL) BY MOUTH DAILY. 90 tablet 1   No current facility-administered medications for this visit.     LABORATORY DATA:  I have reviewed the data as listed Lab Results  Component Value Date   WBC 7.9 10/13/2019   HGB 14.8 10/13/2019   HCT 42.6 10/13/2019   MCV 83 10/13/2019   PLT 262 10/13/2019     Chemistry      Component Value Date/Time   NA 141 10/13/2019 1149   K 4.1 10/13/2019 1149   CL 103 10/13/2019 1149   CO2 22 10/13/2019 1149   BUN 19 10/13/2019 1149   CREATININE 1.11 10/13/2019 1149   CREATININE 1.05 02/18/2013 0916      Component Value Date/Time   CALCIUM 10.1 10/13/2019 1149   ALKPHOS 56 10/13/2019 1149   AST 19 10/13/2019 1149   ALT 17 10/13/2019 1149   BILITOT 1.8 (H) 10/13/2019 1149       ASSESSMENT & PLAN:  Encounter for screening for lung cancer 1.  Tobacco abuse: -This patient meets the criteria for low-dose CT lung cancer screening.  He Garcia asymptomatic for any signs or symptoms of lung cancer. -This shared decision making visit discussion included risk and benefits of screening, potential for follow-up, diagnostic testing for abnormal scans, potential for false positive test, over diagnosis, and discussion about total radiation exposure. -Patient stated willingness to undergo diagnostics and treatment as needed. -Patient was counseled on smoking cessation to decrease risk of lung cancer,  pulmonary disease, heart disease and stroke. -Patient was given a resource card with information on receiving free nicotine replacement therapy and information about free smoking cessation classes. -Patient will present for LD CT scan today and follow-up with PCP.       Jorge Isle, NP-C 10/28/2019 9:39 AM

## 2019-10-28 NOTE — Patient Instructions (Signed)
You were seen today for your shared decision making visit and a low-dose CT scan for lung cancer screening.    

## 2019-10-28 NOTE — Progress Notes (Signed)
Patient notified via mail of LDCT lung cancer screening results with recommendations to follow up in 12 months.  Also notified of incidental findings and need to follow up with PCP.  Patient's referring provider was sent a copy of results.    IMPRESSION: 1. Lung-RADS 1, negative. Continue annual screening with low-dose chest CT without contrast in 12 months. 2. Cholelithiasis. 3. Possible constipation. 4. Aortic atherosclerosis (ICD10-I70.0), coronary artery atherosclerosis and emphysema (ICD10-J43.9).

## 2019-10-28 NOTE — Assessment & Plan Note (Signed)
1.  Tobacco abuse: -This patient meets the criteria for low-dose CT lung cancer screening.  He is asymptomatic for any signs or symptoms of lung cancer. -This shared decision making visit discussion included risk and benefits of screening, potential for follow-up, diagnostic testing for abnormal scans, potential for false positive test, over diagnosis, and discussion about total radiation exposure. -Patient stated willingness to undergo diagnostics and treatment as needed. -Patient was counseled on smoking cessation to decrease risk of lung cancer, pulmonary disease, heart disease and stroke. -Patient was given a resource card with information on receiving free nicotine replacement therapy and information about free smoking cessation classes. -Patient will present for LD CT scan today and follow-up with PCP.

## 2019-12-21 ENCOUNTER — Other Ambulatory Visit: Payer: Self-pay | Admitting: Nurse Practitioner

## 2020-01-05 DIAGNOSIS — E1142 Type 2 diabetes mellitus with diabetic polyneuropathy: Secondary | ICD-10-CM | POA: Diagnosis not present

## 2020-01-05 DIAGNOSIS — M79676 Pain in unspecified toe(s): Secondary | ICD-10-CM | POA: Diagnosis not present

## 2020-01-05 DIAGNOSIS — B351 Tinea unguium: Secondary | ICD-10-CM | POA: Diagnosis not present

## 2020-01-05 DIAGNOSIS — L84 Corns and callosities: Secondary | ICD-10-CM | POA: Diagnosis not present

## 2020-01-16 ENCOUNTER — Ambulatory Visit (INDEPENDENT_AMBULATORY_CARE_PROVIDER_SITE_OTHER): Payer: Medicare HMO | Admitting: Nurse Practitioner

## 2020-01-16 ENCOUNTER — Encounter: Payer: Self-pay | Admitting: Nurse Practitioner

## 2020-01-16 ENCOUNTER — Other Ambulatory Visit: Payer: Self-pay

## 2020-01-16 VITALS — BP 125/67 | HR 62 | Temp 97.7°F | Resp 20 | Ht 73.0 in | Wt 204.0 lb

## 2020-01-16 DIAGNOSIS — E7800 Pure hypercholesterolemia, unspecified: Secondary | ICD-10-CM

## 2020-01-16 DIAGNOSIS — K219 Gastro-esophageal reflux disease without esophagitis: Secondary | ICD-10-CM | POA: Diagnosis not present

## 2020-01-16 DIAGNOSIS — E119 Type 2 diabetes mellitus without complications: Secondary | ICD-10-CM

## 2020-01-16 DIAGNOSIS — Z6829 Body mass index (BMI) 29.0-29.9, adult: Secondary | ICD-10-CM

## 2020-01-16 DIAGNOSIS — I10 Essential (primary) hypertension: Secondary | ICD-10-CM | POA: Diagnosis not present

## 2020-01-16 LAB — BAYER DCA HB A1C WAIVED: HB A1C (BAYER DCA - WAIVED): 6.4 % (ref <7.0)

## 2020-01-16 NOTE — Progress Notes (Signed)
Subjective:    Patient ID: Jorge Garcia, male    DOB: 02-26-1946, 74 y.o.   MRN: [id number]   Chief Complaint: Medical Management of Chronic Issues    HPI:  1. Type 2 diabetes mellitus without complication, without long-term current use of insulin (HCC) hA1C 6.4% today, taking medications as prescribed. Checks CBG regularly. States eating healthy, avoids sugar & carb. Works in garden for exercise.  Lab Results  Component Value Date   HGBA1C 6.9 10/13/2019     2. Pure hypercholesterolemia Taking medications as prescribed, avoiding fried and fatty foods. Lab Results  Component Value Date   CHOL 100 10/13/2019   HDL 42 10/13/2019   LDLCALC 41 10/13/2019   TRIG 85 10/13/2019   CHOLHDL 2.4 10/13/2019     3. Essential hypertension Does not check BP regularly. Takes meds as prescribed. Denies chest pain, SOB, weakness, or fatigue.  BP Readings from Last 3 Encounters:  01/16/20 125/67  10/13/19 117/69  07/12/19 124/71      4. Gastroesophageal reflux disease without esophagitis Takes medications as prescribed, has not had issues related to reflux.  5. BMI 29.0-29.9,adult Pt is down 9lbs from last visit, following healthy diet.  BMI Readings from Last 3 Encounters:  01/16/20 26.91 kg/m  10/13/19 28.10 kg/m  07/12/19 29.29 kg/m   Wt Readings from Last 3 Encounters:  01/16/20 204 lb (92.5 kg)  10/13/19 213 lb (96.6 kg)  07/12/19 222 lb (100.7 kg)        Outpatient Encounter Medications as of 01/16/2020  Medication Sig  . Accu-Chek Softclix Lancets lancets Check glucose 4 times daily dx E11.9  . amLODipine (NORVASC) 10 MG tablet Take 1 tablet (10 mg total) by mouth daily.  Marland Kitchen aspirin EC 81 MG tablet Take 81 mg by mouth daily.  . benazepril (LOTENSIN) 40 MG tablet Take 1 tablet (40 mg total) by mouth daily.  . cholecalciferol (VITAMIN D) 1000 UNITS tablet Take 2,000 Units by mouth daily.   . fish oil-omega-3 fatty acids 1000 MG capsule Take 1 g by mouth daily.     Marland Kitchen glucose blood (ACCU-CHEK AVIVA PLUS) test strip Check glucose 4 times daily dx E11.9  . hydrochlorothiazide (HYDRODIURIL) 25 MG tablet Take 1 tablet (25 mg total) by mouth daily.  Marland Kitchen ibuprofen (ADVIL) 400 MG tablet Take 1 tablet (400 mg total) by mouth every 6 (six) hours as needed.  . metFORMIN (GLUCOPHAGE) 1000 MG tablet Take 1 tablet (1,000 mg total) by mouth 2 (two) times daily with a meal.  . omeprazole (PRILOSEC) 40 MG capsule TAKE ONE CAPSULE BY MOUTH ONCE DAILY BEFORE BREAKFAST  . rosuvastatin (CRESTOR) 10 MG tablet TAKE 1 TABLET (10 MG TOTAL) BY MOUTH DAILY.   No facility-administered encounter medications on file as of 01/16/2020.    Past Surgical History:  Procedure Laterality Date  . COLONOSCOPY  12/01/2013  . [place]  . POLYPECTOMY    . UPPER GASTROINTESTINAL ENDOSCOPY      Family History  Problem Relation Age of Onset  . Diabetes Mother   . Hypertension Mother   . Stroke Mother 56  . Diabetes Father   . Hypertension Father   . Heart attack Father 37  . Colon cancer Neg Hx   . Colon polyps Neg Hx   . Esophageal cancer Neg Hx   . Stomach cancer Neg Hx   . Rectal cancer Neg Hx     New complaints: No new complaints today.  Social history: Lives alone, has  girlfriend. Plays golf.  Controlled substance contract: n/a  BP 125/67   Pulse 62   Temp 97.7 F (36.5 C) (Temporal)   Resp 20   Ht '6\' 1"'$  (1.854 m)   Wt 204 lb (92.5 kg)   SpO2 96%   BMI 26.91 kg/m    Review of Systems  Constitutional: Negative for diaphoresis.  Eyes: Negative for pain.  Respiratory: Negative for shortness of breath.   Cardiovascular: Negative for chest pain, palpitations and leg swelling.  Gastrointestinal: Negative for abdominal pain.  Endocrine: Negative for polydipsia.  Skin: Negative for rash.  Neurological: Negative for dizziness, weakness and headaches.  Hematological: Does not bruise/bleed easily.  All other systems reviewed and are negative.        Objective:   Physical Exam Constitutional:      Appearance: Normal appearance. He is normal weight.  HENT:     Head: Normocephalic.     Right Ear: Tympanic membrane normal.     Left Ear: Tympanic membrane normal.     Nose: Nose normal.     Mouth/Throat:     Mouth: Mucous membranes are dry.     Pharynx: Oropharynx is clear.  Eyes:     Extraocular Movements: Extraocular movements intact.     Pupils: Pupils are equal, round, and reactive to light.  Cardiovascular:     Rate and Rhythm: Normal rate and regular rhythm.     Pulses: Normal pulses.     Heart sounds: Normal heart sounds.  Pulmonary:     Effort: Pulmonary effort is normal.     Breath sounds: Normal breath sounds.  Abdominal:     General: Bowel sounds are normal.     Palpations: Abdomen is soft.  Musculoskeletal:        General: Normal range of motion.     Cervical back: Normal range of motion and neck supple.  Skin:    General: Skin is warm and dry.     Capillary Refill: Capillary refill takes less than 2 seconds.  Neurological:     General: No focal deficit present.     Mental Status: He is alert and oriented to person, place, and time. Mental status is at baseline.  Psychiatric:        Mood and Affect: Mood normal.        Behavior: Behavior normal.   BP 125/67   Pulse 62   Temp 97.7 F (36.5 C) (Temporal)   Resp 20   Ht '6\' 1"'$  (1.854 m)   Wt 204 lb (92.5 kg)   SpO2 96%   BMI 26.91 kg/m          Assessment & Plan:  Jorge Garcia comes in today with chief complaint of Medical Management of Chronic Issues   Diagnosis and orders addressed:  1. Type 2 diabetes mellitus without complication, without long-term current use of insulin (HCC) Continue checking CBG regularly and taking medications as prescribed. Eat a low sugar and carb diet.  - Bayer DCA Hb A1c Waived  2. Pure hypercholesterolemia Avoid fatty and fried foods that are high in cholesterol. - Lipid panel  3. Essential  hypertension Follow a low salty diet and take medications as prescribed. - CBC with Differential/Platelet - CMP14+EGFR  4. Gastroesophageal reflux disease without esophagitis Continue medications, eat smaller meals more frequently, do not lie down after meals. Avoid spicy foods that may trigger reflux.  5. BMI 29.0-29.9,adult Discussed diet and exercise for person with BMI >25 Will recheck weight in 3-6 months  Labs pending Health Maintenance reviewed Diet and exercise encouraged  Follow up plan: 3 months   Mary-Margaret Hassell Done, FNP

## 2020-01-16 NOTE — Patient Instructions (Signed)
Diabetes Mellitus and Foot Care Foot care is an important part of your health, especially when you have diabetes. Diabetes may cause you to have problems because of poor blood flow (circulation) to your feet and legs, which can cause your skin to:  Become thinner and drier.  Break more easily.  Heal more slowly.  Peel and crack. You may also have nerve damage (neuropathy) in your legs and feet, causing decreased feeling in them. This means that you may not notice minor injuries to your feet that could lead to more serious problems. Noticing and addressing any potential problems early is the best way to prevent future foot problems. How to care for your feet Foot hygiene  Wash your feet daily with warm water and mild soap. Do not use hot water. Then, pat your feet and the areas between your toes until they are completely dry. Do not soak your feet as this can dry your skin.  Trim your toenails straight across. Do not dig under them or around the cuticle. File the edges of your nails with an emery board or nail file.  Apply a moisturizing lotion or petroleum jelly to the skin on your feet and to dry, brittle toenails. Use lotion that does not contain alcohol and is unscented. Do not apply lotion between your toes. Shoes and socks  Wear clean socks or stockings every day. Make sure they are not too tight. Do not wear knee-high stockings since they may decrease blood flow to your legs.  Wear shoes that fit properly and have enough cushioning. Always look in your shoes before you put them on to be sure there are no objects inside.  To break in new shoes, wear them for just a few hours a day. This prevents injuries on your feet. Wounds, scrapes, corns, and calluses  Check your feet daily for blisters, cuts, bruises, sores, and redness. If you cannot see the bottom of your feet, use a mirror or ask someone for help.  Do not cut corns or calluses or try to remove them with medicine.  If you  find a minor scrape, cut, or break in the skin on your feet, keep it and the skin around it clean and dry. You may clean these areas with mild soap and water. Do not clean the area with peroxide, alcohol, or iodine.  If you have a wound, scrape, corn, or callus on your foot, look at it several times a day to make sure it is healing and not infected. Check for: ? Redness, swelling, or pain. ? Fluid or blood. ? Warmth. ? Pus or a bad smell. General instructions  Do not cross your legs. This may decrease blood flow to your feet.  Do not use heating pads or hot water bottles on your feet. They may burn your skin. If you have lost feeling in your feet or legs, you may not know this is happening until it is too late.  Protect your feet from hot and cold by wearing shoes, such as at the beach or on hot pavement.  Schedule a complete foot exam at least once a year (annually) or more often if you have foot problems. If you have foot problems, report any cuts, sores, or bruises to your health care provider immediately. Contact a health care provider if:  You have a medical condition that increases your risk of infection and you have any cuts, sores, or bruises on your feet.  You have an injury that is not   healing.  You have redness on your legs or feet.  You feel burning or tingling in your legs or feet.  You have pain or cramps in your legs and feet.  Your legs or feet are numb.  Your feet always feel cold.  You have pain around a toenail. Get help right away if:  You have a wound, scrape, corn, or callus on your foot and: ? You have pain, swelling, or redness that gets worse. ? You have fluid or blood coming from the wound, scrape, corn, or callus. ? Your wound, scrape, corn, or callus feels warm to the touch. ? You have pus or a bad smell coming from the wound, scrape, corn, or callus. ? You have a fever. ? You have a red line going up your leg. Summary  Check your feet every day  for cuts, sores, red spots, swelling, and blisters.  Moisturize feet and legs daily.  Wear shoes that fit properly and have enough cushioning.  If you have foot problems, report any cuts, sores, or bruises to your health care provider immediately.  Schedule a complete foot exam at least once a year (annually) or more often if you have foot problems. This information is not intended to replace advice given to you by your health care provider. Make sure you discuss any questions you have with your health care provider. Document Revised: 04/20/2019 Document Reviewed: 08/29/2016 Elsevier Patient Education  2020 [employer]  

## 2020-01-17 LAB — CBC WITH DIFFERENTIAL/PLATELET
Basophils Absolute: 0 10*3/uL (ref 0.0–0.2)
Basos: 1 %
EOS (ABSOLUTE): 0.4 10*3/uL (ref 0.0–0.4)
Eos: 5 %
Hematocrit: 40.7 % (ref 37.5–51.0)
Hemoglobin: 13.7 g/dL (ref 13.0–17.7)
Immature Grans (Abs): 0 10*3/uL (ref 0.0–0.1)
Immature Granulocytes: 0 %
Lymphocytes Absolute: 1.9 10*3/uL (ref 0.7–3.1)
Lymphs: 28 %
MCH: 28.5 pg (ref 26.6–33.0)
MCHC: 33.7 g/dL (ref 31.5–35.7)
MCV: 85 fL (ref 79–97)
Monocytes Absolute: 0.5 10*3/uL (ref 0.1–0.9)
Monocytes: 8 %
Neutrophils Absolute: 3.9 10*3/uL (ref 1.4–7.0)
Neutrophils: 58 %
Platelets: 251 10*3/uL (ref 150–450)
RBC: 4.8 x10E6/uL (ref 4.14–5.80)
RDW: 13 % (ref 11.6–15.4)
WBC: 6.8 10*3/uL (ref 3.4–10.8)

## 2020-01-17 LAB — CMP14+EGFR
ALT: 14 IU/L (ref 0–44)
AST: 16 IU/L (ref 0–40)
Albumin/Globulin Ratio: 2.3 — ABNORMAL HIGH (ref 1.2–2.2)
Albumin: 4.5 g/dL (ref 3.7–4.7)
Alkaline Phosphatase: 51 IU/L (ref 48–121)
BUN/Creatinine Ratio: 22 (ref 10–24)
BUN: 22 mg/dL (ref 8–27)
Bilirubin Total: 1.7 mg/dL — ABNORMAL HIGH (ref 0.0–1.2)
CO2: 23 mmol/L (ref 20–29)
Calcium: 10.1 mg/dL (ref 8.6–10.2)
Chloride: 106 mmol/L (ref 96–106)
Creatinine, Ser: 1.02 mg/dL (ref 0.76–1.27)
GFR calc Af Amer: 85 mL/min/{1.73_m2} (ref >59)
GFR calc non Af Amer: 74 mL/min/{1.73_m2} (ref >59)
Globulin, Total: 2 g/dL (ref 1.5–4.5)
Glucose: 126 mg/dL — ABNORMAL HIGH (ref 65–99)
Potassium: 4.4 mmol/L (ref 3.5–5.2)
Sodium: 143 mmol/L (ref 134–144)
Total Protein: 6.5 g/dL (ref 6.0–8.5)

## 2020-01-17 LAB — LIPID PANEL
Chol/HDL Ratio: 2.4 ratio (ref 0.0–5.0)
Cholesterol, Total: 100 mg/dL (ref 100–199)
HDL: 42 mg/dL (ref >39)
LDL Chol Calc (NIH): 44 mg/dL (ref 0–99)
Triglycerides: 63 mg/dL (ref 0–149)
VLDL Cholesterol Cal: 14 mg/dL (ref 5–40)

## 2020-01-31 ENCOUNTER — Telehealth: Payer: Self-pay | Admitting: Nurse Practitioner

## 2020-01-31 NOTE — Telephone Encounter (Signed)
LVM to call and schedule diabetic eye exam.  

## 2020-02-02 ENCOUNTER — Telehealth: Payer: Self-pay | Admitting: Nurse Practitioner

## 2020-02-02 NOTE — Telephone Encounter (Signed)
Pt has something private to speak with MMM about and MMM is aware and will call pt back.

## 2020-03-02 ENCOUNTER — Other Ambulatory Visit: Payer: Self-pay | Admitting: Nurse Practitioner

## 2020-03-02 DIAGNOSIS — E119 Type 2 diabetes mellitus without complications: Secondary | ICD-10-CM

## 2020-03-15 DIAGNOSIS — L84 Corns and callosities: Secondary | ICD-10-CM | POA: Diagnosis not present

## 2020-03-15 DIAGNOSIS — M79676 Pain in unspecified toe(s): Secondary | ICD-10-CM | POA: Diagnosis not present

## 2020-03-15 DIAGNOSIS — E1142 Type 2 diabetes mellitus with diabetic polyneuropathy: Secondary | ICD-10-CM | POA: Diagnosis not present

## 2020-03-15 DIAGNOSIS — B351 Tinea unguium: Secondary | ICD-10-CM | POA: Diagnosis not present

## 2020-05-04 ENCOUNTER — Other Ambulatory Visit: Payer: Self-pay

## 2020-05-04 ENCOUNTER — Encounter: Payer: Self-pay | Admitting: Nurse Practitioner

## 2020-05-04 ENCOUNTER — Ambulatory Visit (INDEPENDENT_AMBULATORY_CARE_PROVIDER_SITE_OTHER): Payer: Medicare HMO | Admitting: Nurse Practitioner

## 2020-05-04 VITALS — BP 139/70 | HR 60 | Temp 97.9°F | Ht 73.0 in | Wt 206.0 lb

## 2020-05-04 DIAGNOSIS — Z23 Encounter for immunization: Secondary | ICD-10-CM

## 2020-05-04 DIAGNOSIS — K219 Gastro-esophageal reflux disease without esophagitis: Secondary | ICD-10-CM

## 2020-05-04 DIAGNOSIS — E119 Type 2 diabetes mellitus without complications: Secondary | ICD-10-CM | POA: Diagnosis not present

## 2020-05-04 DIAGNOSIS — E7800 Pure hypercholesterolemia, unspecified: Secondary | ICD-10-CM

## 2020-05-04 DIAGNOSIS — I10 Essential (primary) hypertension: Secondary | ICD-10-CM

## 2020-05-04 DIAGNOSIS — Z6829 Body mass index (BMI) 29.0-29.9, adult: Secondary | ICD-10-CM

## 2020-05-04 LAB — BAYER DCA HB A1C WAIVED: HB A1C (BAYER DCA - WAIVED): 6.4 % (ref <7.0)

## 2020-05-04 MED ORDER — OMEPRAZOLE 40 MG PO CPDR: DELAYED_RELEASE_CAPSULE | ORAL | 1 refills | Status: DC

## 2020-05-04 MED ORDER — METFORMIN HCL 1000 MG PO TABS: ORAL_TABLET | ORAL | 1 refills | Status: DC

## 2020-05-04 MED ORDER — HYDROCHLOROTHIAZIDE 25 MG PO TABS: 25.0000 mg | ORAL_TABLET | Freq: Every day | ORAL | 1 refills | Status: DC

## 2020-05-04 MED ORDER — BENAZEPRIL HCL 40 MG PO TABS: 40.0000 mg | ORAL_TABLET | Freq: Every day | ORAL | 1 refills | Status: DC

## 2020-05-04 MED ORDER — ROSUVASTATIN CALCIUM 10 MG PO TABS: ORAL_TABLET | ORAL | 1 refills | Status: DC

## 2020-05-04 MED ORDER — AMLODIPINE BESYLATE 10 MG PO TABS: 10.0000 mg | ORAL_TABLET | Freq: Every day | ORAL | 1 refills | Status: DC

## 2020-05-04 NOTE — Patient Instructions (Signed)
Diabetes Mellitus and Foot Care Foot care is an important part of your health, especially when you have diabetes. Diabetes may cause you to have problems because of poor blood flow (circulation) to your feet and legs, which can cause your skin to:  Become thinner and drier.  Break more easily.  Heal more slowly.  Peel and crack. You may also have nerve damage (neuropathy) in your legs and feet, causing decreased feeling in them. This means that you may not notice minor injuries to your feet that could lead to more serious problems. Noticing and addressing any potential problems early is the best way to prevent future foot problems. How to care for your feet Foot hygiene  Wash your feet daily with warm water and mild soap. Do not use hot water. Then, pat your feet and the areas between your toes until they are completely dry. Do not soak your feet as this can dry your skin.  Trim your toenails straight across. Do not dig under them or around the cuticle. File the edges of your nails with an emery board or nail file.  Apply a moisturizing lotion or petroleum jelly to the skin on your feet and to dry, brittle toenails. Use lotion that does not contain alcohol and is unscented. Do not apply lotion between your toes. Shoes and socks  Wear clean socks or stockings every day. Make sure they are not too tight. Do not wear knee-high stockings since they may decrease blood flow to your legs.  Wear shoes that fit properly and have enough cushioning. Always look in your shoes before you put them on to be sure there are no objects inside.  To break in new shoes, wear them for just a few hours a day. This prevents injuries on your feet. Wounds, scrapes, corns, and calluses  Check your feet daily for blisters, cuts, bruises, sores, and redness. If you cannot see the bottom of your feet, use a mirror or ask someone for help.  Do not cut corns or calluses or try to remove them with medicine.  If you  find a minor scrape, cut, or break in the skin on your feet, keep it and the skin around it clean and dry. You may clean these areas with mild soap and water. Do not clean the area with peroxide, alcohol, or iodine.  If you have a wound, scrape, corn, or callus on your foot, look at it several times a day to make sure it is healing and not infected. Check for: ? Redness, swelling, or pain. ? Fluid or blood. ? Warmth. ? Pus or a bad smell. General instructions  Do not cross your legs. This may decrease blood flow to your feet.  Do not use heating pads or hot water bottles on your feet. They may burn your skin. If you have lost feeling in your feet or legs, you may not know this is happening until it is too late.  Protect your feet from hot and cold by wearing shoes, such as at the beach or on hot pavement.  Schedule a complete foot exam at least once a year (annually) or more often if you have foot problems. If you have foot problems, report any cuts, sores, or bruises to your health care provider immediately. Contact a health care provider if:  You have a medical condition that increases your risk of infection and you have any cuts, sores, or bruises on your feet.  You have an injury that is not   healing.  You have redness on your legs or feet.  You feel burning or tingling in your legs or feet.  You have pain or cramps in your legs and feet.  Your legs or feet are numb.  Your feet always feel cold.  You have pain around a toenail. Get help right away if:  You have a wound, scrape, corn, or callus on your foot and: ? You have pain, swelling, or redness that gets worse. ? You have fluid or blood coming from the wound, scrape, corn, or callus. ? Your wound, scrape, corn, or callus feels warm to the touch. ? You have pus or a bad smell coming from the wound, scrape, corn, or callus. ? You have a fever. ? You have a red line going up your leg. Summary  Check your feet every day  for cuts, sores, red spots, swelling, and blisters.  Moisturize feet and legs daily.  Wear shoes that fit properly and have enough cushioning.  If you have foot problems, report any cuts, sores, or bruises to your health care provider immediately.  Schedule a complete foot exam at least once a year (annually) or more often if you have foot problems. This information is not intended to replace advice given to you by your health care provider. Make sure you discuss any questions you have with your health care provider. Document Revised: 04/20/2019 Document Reviewed: 08/29/2016 Elsevier Patient Education  2020 [employer]  

## 2020-05-04 NOTE — Progress Notes (Signed)
Subjective:    Patient ID: Jorge Garcia, male    DOB: 11/01/1945, 74 y.o.   MRN: [id number]   Chief Complaint: Medical Management of Chronic Issues    HPI:  1. Type 2 diabetes mellitus without complication, without long-term current use of insulin (HCC) Fasting blood sugars are not checked very often. Usually runs between 110-130. He denies any low blood sugars. Lab Results  Component Value Date   HGBA1C 6.4 01/16/2020     2. Essential hypertension No c/o chest pain, sob or headache. Does not check blood pressure at home. BP Readings from Last 3 Encounters:  05/04/20 139/70  01/16/20 125/67  10/13/19 117/69     3. Pure hypercholesterolemia He does try to watch diet and stays very active. Lab Results  Component Value Date   CHOL 100 01/16/2020   HDL 42 01/16/2020   LDLCALC 44 01/16/2020   TRIG 63 01/16/2020   CHOLHDL 2.4 01/16/2020     4. Gastroesophageal reflux disease without esophagitis Is on omeprazole dialy and is doing well. Works well to keep symptoms under control.   5. BMI 29.0-29.9,adult No recent weight changes Wt Readings from Last 3 Encounters:  05/04/20 206 lb (93.4 kg)  01/16/20 204 lb (92.5 kg)  10/13/19 213 lb (96.6 kg)   BMI Readings from Last 3 Encounters:  05/04/20 27.18 kg/m  01/16/20 26.91 kg/m  10/13/19 28.10 kg/m       Outpatient Encounter Medications as of 05/04/2020  Medication Sig  . Accu-Chek Softclix Lancets lancets Check glucose 4 times daily dx E11.9  . amLODipine (NORVASC) 10 MG tablet Take 1 tablet (10 mg total) by mouth daily.  Marland Kitchen aspirin EC 81 MG tablet Take 81 mg by mouth daily.  . benazepril (LOTENSIN) 40 MG tablet Take 1 tablet (40 mg total) by mouth daily.  . cholecalciferol (VITAMIN D) 1000 UNITS tablet Take 2,000 Units by mouth daily.   . fish oil-omega-3 fatty acids 1000 MG capsule Take 1 g by mouth daily.    Marland Kitchen glucose blood (ACCU-CHEK AVIVA PLUS) test strip Check glucose 4 times daily dx E11.9  .  hydrochlorothiazide (HYDRODIURIL) 25 MG tablet Take 1 tablet (25 mg total) by mouth daily.  Marland Kitchen ibuprofen (ADVIL) 400 MG tablet Take 1 tablet (400 mg total) by mouth every 6 (six) hours as needed.  . metFORMIN (GLUCOPHAGE) 1000 MG tablet TAKE 1 TABLET BY MOUTH TWICE DAILY WITH A MEAL  . omeprazole (PRILOSEC) 40 MG capsule TAKE ONE CAPSULE BY MOUTH ONCE DAILY BEFORE BREAKFAST  . rosuvastatin (CRESTOR) 10 MG tablet TAKE 1 TABLET (10 MG TOTAL) BY MOUTH DAILY.     Past Surgical History:  Procedure Laterality Date  . COLONOSCOPY  12/01/2013  . [place]  . POLYPECTOMY    . UPPER GASTROINTESTINAL ENDOSCOPY      Family History  Problem Relation Age of Onset  . Diabetes Mother   . Hypertension Mother   . Stroke Mother 42  . Diabetes Father   . Hypertension Father   . Heart attack Father 51  . Colon cancer Neg Hx   . Colon polyps Neg Hx   . Esophageal cancer Neg Hx   . Stomach cancer Neg Hx   . Rectal cancer Neg Hx     New complaints: None today  Social history: Lives by hisself. Has  A girlfriend  Controlled substance contract: n/a    Review of Systems  Constitutional: Negative for diaphoresis.  Eyes: Negative for pain.  Respiratory: Negative  for shortness of breath.   Cardiovascular: Negative for chest pain, palpitations and leg swelling.  Gastrointestinal: Negative for abdominal pain.  Endocrine: Negative for polydipsia.  Skin: Negative for rash.  Neurological: Negative for dizziness, weakness and headaches.  Hematological: Does not bruise/bleed easily.  All other systems reviewed and are negative.      Objective:   Physical Exam Vitals and nursing note reviewed.  Constitutional:      Appearance: Normal appearance. He is well-developed.  HENT:     Head: Normocephalic.     Nose: Nose normal.  Eyes:     Pupils: Pupils are equal, round, and reactive to light.  Neck:     Thyroid: No thyroid mass or thyromegaly.     Vascular: No carotid bruit or  JVD.     Trachea: Phonation normal.  Cardiovascular:     Rate and Rhythm: Normal rate and regular rhythm.  Pulmonary:     Effort: Pulmonary effort is normal. No respiratory distress.     Breath sounds: Normal breath sounds.  Abdominal:     General: Bowel sounds are normal.     Palpations: Abdomen is soft.     Tenderness: There is no abdominal tenderness.  Musculoskeletal:        General: Normal range of motion.     Cervical back: Normal range of motion and neck supple.  Lymphadenopathy:     Cervical: No cervical adenopathy.  Skin:    General: Skin is warm and dry.  Neurological:     Mental Status: He is alert and oriented to person, place, and time.  Psychiatric:        Behavior: Behavior normal.        Thought Content: Thought content normal.        Judgment: Judgment normal.     BP 139/70   Pulse 60   Temp 97.9 F (36.6 C) (Temporal)   Ht 6' 1" (1.854 m)   Wt 206 lb (93.4 kg)   SpO2 98%   BMI 27.18 kg/m   hgba1c 64%     Assessment & Plan:  Jorge Garcia comes in today with chief complaint of Medical Management of Chronic Issues   Diagnosis and orders addressed:  1. Type 2 diabetes mellitus without complication, without long-term current use of insulin (HCC) Continue to watch carbsin diet - Microalbumin / creatinine urine ratio - Bayer DCA Hb A1c Waived - metFORMIN (GLUCOPHAGE) 1000 MG tablet; TAKE 1 TABLET BY MOUTH TWICE DAILY WITH A MEAL  Dispense: 180 tablet; Refill: 1  2. Essential hypertension Low sodium diet - CMP14+EGFR - CBC with Differential/Platelet - benazepril (LOTENSIN) 40 MG tablet; Take 1 tablet (40 mg total) by mouth daily.  Dispense: 90 tablet; Refill: 1 - hydrochlorothiazide (HYDRODIURIL) 25 MG tablet; Take 1 tablet (25 mg total) by mouth daily.  Dispense: 90 tablet; Refill: 1 - amLODipine (NORVASC) 10 MG tablet; Take 1 tablet (10 mg total) by mouth daily.  Dispense: 90 tablet; Refill: 1  3. Pure hypercholesterolemia Low fat diet -  Lipid panel - rosuvastatin (CRESTOR) 10 MG tablet; TAKE 1 TABLET (10 MG TOTAL) BY MOUTH DAILY.  Dispense: 90 tablet; Refill: 1  4. Gastroesophageal reflux disease without esophagitis Avoid spicy foods Do not eat 2 hours prior to bedtime - omeprazole (PRILOSEC) 40 MG capsule; TAKE ONE CAPSULE BY MOUTH ONCE DAILY BEFORE BREAKFAST  Dispense: 90 capsule; Refill: 1  5. BMI 29.0-29.9,adult Discussed diet and exercise for person with BMI >25 Will recheck weight in 3-6 months  Labs pending Health Maintenance reviewed Diet and exercise encouraged  Follow up plan: 3 month   [place], FNP

## 2020-05-05 LAB — CBC WITH DIFFERENTIAL/PLATELET
Basophils Absolute: 0.1 10*3/uL (ref 0.0–0.2)
Basos: 1 %
EOS (ABSOLUTE): 3 10*3/uL — ABNORMAL HIGH (ref 0.0–0.4)
Eos: 27 %
Hematocrit: 42.8 % (ref 37.5–51.0)
Hemoglobin: 14.3 g/dL (ref 13.0–17.7)
Immature Grans (Abs): 0 10*3/uL (ref 0.0–0.1)
Immature Granulocytes: 0 %
Lymphocytes Absolute: 2.4 10*3/uL (ref 0.7–3.1)
Lymphs: 22 %
MCH: 28.3 pg (ref 26.6–33.0)
MCHC: 33.4 g/dL (ref 31.5–35.7)
MCV: 85 fL (ref 79–97)
Monocytes Absolute: 0.7 10*3/uL (ref 0.1–0.9)
Monocytes: 6 %
Neutrophils Absolute: 4.9 10*3/uL (ref 1.4–7.0)
Neutrophils: 44 %
Platelets: 239 10*3/uL (ref 150–450)
RBC: 5.05 x10E6/uL (ref 4.14–5.80)
RDW: 13.4 % (ref 11.6–15.4)
WBC: 11 10*3/uL — ABNORMAL HIGH (ref 3.4–10.8)

## 2020-05-05 LAB — CMP14+EGFR
ALT: 18 IU/L (ref 0–44)
AST: 18 IU/L (ref 0–40)
Albumin/Globulin Ratio: 2 (ref 1.2–2.2)
Albumin: 4.6 g/dL (ref 3.7–4.7)
Alkaline Phosphatase: 60 IU/L (ref 44–121)
BUN/Creatinine Ratio: 19 (ref 10–24)
BUN: 21 mg/dL (ref 8–27)
Bilirubin Total: 2.4 mg/dL — ABNORMAL HIGH (ref 0.0–1.2)
CO2: 26 mmol/L (ref 20–29)
Calcium: 10.1 mg/dL (ref 8.6–10.2)
Chloride: 102 mmol/L (ref 96–106)
Creatinine, Ser: 1.09 mg/dL (ref 0.76–1.27)
GFR calc Af Amer: 78 mL/min/{1.73_m2} (ref >59)
GFR calc non Af Amer: 67 mL/min/{1.73_m2} (ref >59)
Globulin, Total: 2.3 g/dL (ref 1.5–4.5)
Glucose: 123 mg/dL — ABNORMAL HIGH (ref 65–99)
Potassium: 5 mmol/L (ref 3.5–5.2)
Sodium: 141 mmol/L (ref 134–144)
Total Protein: 6.9 g/dL (ref 6.0–8.5)

## 2020-05-05 LAB — LIPID PANEL
Chol/HDL Ratio: 2.5 ratio (ref 0.0–5.0)
Cholesterol, Total: 103 mg/dL (ref 100–199)
HDL: 41 mg/dL (ref >39)
LDL Chol Calc (NIH): 47 mg/dL (ref 0–99)
Triglycerides: 70 mg/dL (ref 0–149)
VLDL Cholesterol Cal: 15 mg/dL (ref 5–40)

## 2020-05-16 ENCOUNTER — Ambulatory Visit: Payer: Medicare HMO

## 2020-05-24 DIAGNOSIS — M79676 Pain in unspecified toe(s): Secondary | ICD-10-CM | POA: Diagnosis not present

## 2020-05-24 DIAGNOSIS — B351 Tinea unguium: Secondary | ICD-10-CM | POA: Diagnosis not present

## 2020-05-24 DIAGNOSIS — E1142 Type 2 diabetes mellitus with diabetic polyneuropathy: Secondary | ICD-10-CM | POA: Diagnosis not present

## 2020-05-24 DIAGNOSIS — L84 Corns and callosities: Secondary | ICD-10-CM | POA: Diagnosis not present

## 2020-06-05 DIAGNOSIS — H02834 Dermatochalasis of left upper eyelid: Secondary | ICD-10-CM | POA: Diagnosis not present

## 2020-06-05 DIAGNOSIS — H18413 Arcus senilis, bilateral: Secondary | ICD-10-CM | POA: Diagnosis not present

## 2020-06-05 DIAGNOSIS — H02831 Dermatochalasis of right upper eyelid: Secondary | ICD-10-CM | POA: Diagnosis not present

## 2020-06-05 DIAGNOSIS — Z961 Presence of intraocular lens: Secondary | ICD-10-CM | POA: Diagnosis not present

## 2020-06-05 LAB — HM DIABETES EYE EXAM

## 2020-07-16 ENCOUNTER — Other Ambulatory Visit: Payer: Self-pay | Admitting: Nurse Practitioner

## 2020-07-16 DIAGNOSIS — E119 Type 2 diabetes mellitus without complications: Secondary | ICD-10-CM

## 2020-07-18 ENCOUNTER — Ambulatory Visit: Payer: Medicare HMO

## 2020-08-01 ENCOUNTER — Other Ambulatory Visit: Payer: Self-pay | Admitting: Nurse Practitioner

## 2020-08-01 DIAGNOSIS — E7800 Pure hypercholesterolemia, unspecified: Secondary | ICD-10-CM

## 2020-08-09 DIAGNOSIS — M79676 Pain in unspecified toe(s): Secondary | ICD-10-CM | POA: Diagnosis not present

## 2020-08-09 DIAGNOSIS — B351 Tinea unguium: Secondary | ICD-10-CM | POA: Diagnosis not present

## 2020-08-09 DIAGNOSIS — E1142 Type 2 diabetes mellitus with diabetic polyneuropathy: Secondary | ICD-10-CM | POA: Diagnosis not present

## 2020-08-09 DIAGNOSIS — L84 Corns and callosities: Secondary | ICD-10-CM | POA: Diagnosis not present

## 2020-08-14 ENCOUNTER — Ambulatory Visit (INDEPENDENT_AMBULATORY_CARE_PROVIDER_SITE_OTHER): Payer: Medicare HMO | Admitting: Nurse Practitioner

## 2020-08-14 ENCOUNTER — Encounter: Payer: Self-pay | Admitting: Nurse Practitioner

## 2020-08-14 ENCOUNTER — Other Ambulatory Visit: Payer: Self-pay

## 2020-08-14 VITALS — BP 114/70 | HR 70 | Temp 98.5°F | Resp 20 | Ht 73.0 in | Wt 214.0 lb

## 2020-08-14 DIAGNOSIS — E7800 Pure hypercholesterolemia, unspecified: Secondary | ICD-10-CM | POA: Diagnosis not present

## 2020-08-14 DIAGNOSIS — E119 Type 2 diabetes mellitus without complications: Secondary | ICD-10-CM

## 2020-08-14 DIAGNOSIS — Z6829 Body mass index (BMI) 29.0-29.9, adult: Secondary | ICD-10-CM | POA: Diagnosis not present

## 2020-08-14 DIAGNOSIS — I10 Essential (primary) hypertension: Secondary | ICD-10-CM | POA: Diagnosis not present

## 2020-08-14 DIAGNOSIS — K219 Gastro-esophageal reflux disease without esophagitis: Secondary | ICD-10-CM

## 2020-08-14 LAB — CMP14+EGFR
ALT: 18 IU/L (ref 0–44)
AST: 16 IU/L (ref 0–40)
Albumin/Globulin Ratio: 1.8 (ref 1.2–2.2)
Albumin: 4.3 g/dL (ref 3.7–4.7)
Alkaline Phosphatase: 51 IU/L (ref 44–121)
BUN/Creatinine Ratio: 19 (ref 10–24)
BUN: 21 mg/dL (ref 8–27)
Bilirubin Total: 2.1 mg/dL — ABNORMAL HIGH (ref 0.0–1.2)
CO2: 23 mmol/L (ref 20–29)
Calcium: 9.7 mg/dL (ref 8.6–10.2)
Chloride: 106 mmol/L (ref 96–106)
Creatinine, Ser: 1.08 mg/dL (ref 0.76–1.27)
GFR calc Af Amer: 79 mL/min/{1.73_m2} (ref >59)
GFR calc non Af Amer: 68 mL/min/{1.73_m2} (ref >59)
Globulin, Total: 2.4 g/dL (ref 1.5–4.5)
Glucose: 124 mg/dL — ABNORMAL HIGH (ref 65–99)
Potassium: 4.7 mmol/L (ref 3.5–5.2)
Sodium: 142 mmol/L (ref 134–144)
Total Protein: 6.7 g/dL (ref 6.0–8.5)

## 2020-08-14 LAB — CBC WITH DIFFERENTIAL/PLATELET
Basophils Absolute: 0.1 10*3/uL (ref 0.0–0.2)
Basos: 1 %
EOS (ABSOLUTE): 0.3 10*3/uL (ref 0.0–0.4)
Eos: 4 %
Hematocrit: 39.5 % (ref 37.5–51.0)
Hemoglobin: 13.5 g/dL (ref 13.0–17.7)
Immature Grans (Abs): 0 10*3/uL (ref 0.0–0.1)
Immature Granulocytes: 0 %
Lymphocytes Absolute: 2 10*3/uL (ref 0.7–3.1)
Lymphs: 27 %
MCH: 28.7 pg (ref 26.6–33.0)
MCHC: 34.2 g/dL (ref 31.5–35.7)
MCV: 84 fL (ref 79–97)
Monocytes Absolute: 0.6 10*3/uL (ref 0.1–0.9)
Monocytes: 9 %
Neutrophils Absolute: 4.4 10*3/uL (ref 1.4–7.0)
Neutrophils: 59 %
Platelets: 243 10*3/uL (ref 150–450)
RBC: 4.7 x10E6/uL (ref 4.14–5.80)
RDW: 12.8 % (ref 11.6–15.4)
WBC: 7.5 10*3/uL (ref 3.4–10.8)

## 2020-08-14 LAB — LIPID PANEL
Chol/HDL Ratio: 2.5 ratio (ref 0.0–5.0)
Cholesterol, Total: 102 mg/dL (ref 100–199)
HDL: 41 mg/dL (ref >39)
LDL Chol Calc (NIH): 46 mg/dL (ref 0–99)
Triglycerides: 69 mg/dL (ref 0–149)
VLDL Cholesterol Cal: 15 mg/dL (ref 5–40)

## 2020-08-14 LAB — BAYER DCA HB A1C WAIVED: HB A1C (BAYER DCA - WAIVED): 6.3 % (ref <7.0)

## 2020-08-14 NOTE — Patient Instructions (Signed)
Diabetes Mellitus and Foot Care Foot care is an important part of your health, especially when you have diabetes. Diabetes may cause you to have problems because of poor blood flow (circulation) to your feet and legs, which can cause your skin to:  Become thinner and drier.  Break more easily.  Heal more slowly.  Peel and crack. You may also have nerve damage (neuropathy) in your legs and feet, causing decreased feeling in them. This means that you may not notice minor injuries to your feet that could lead to more serious problems. Noticing and addressing any potential problems early is the best way to prevent future foot problems. How to care for your feet Foot hygiene  Wash your feet daily with warm water and mild soap. Do not use hot water. Then, pat your feet and the areas between your toes until they are completely dry. Do not soak your feet as this can dry your skin.  Trim your toenails straight across. Do not dig under them or around the cuticle. File the edges of your nails with an emery board or nail file.  Apply a moisturizing lotion or petroleum jelly to the skin on your feet and to dry, brittle toenails. Use lotion that does not contain alcohol and is unscented. Do not apply lotion between your toes. Shoes and socks  Wear clean socks or stockings every day. Make sure they are not too tight. Do not wear knee-high stockings since they may decrease blood flow to your legs.  Wear shoes that fit properly and have enough cushioning. Always look in your shoes before you put them on to be sure there are no objects inside.  To break in new shoes, wear them for just a few hours a day. This prevents injuries on your feet. Wounds, scrapes, corns, and calluses  Check your feet daily for blisters, cuts, bruises, sores, and redness. If you cannot see the bottom of your feet, use a mirror or ask someone for help.  Do not cut corns or calluses or try to remove them with medicine.  If you  find a minor scrape, cut, or break in the skin on your feet, keep it and the skin around it clean and dry. You may clean these areas with mild soap and water. Do not clean the area with peroxide, alcohol, or iodine.  If you have a wound, scrape, corn, or callus on your foot, look at it several times a day to make sure it is healing and not infected. Check for: ? Redness, swelling, or pain. ? Fluid or blood. ? Warmth. ? Pus or a bad smell. General instructions  Do not cross your legs. This may decrease blood flow to your feet.  Do not use heating pads or hot water bottles on your feet. They may burn your skin. If you have lost feeling in your feet or legs, you may not know this is happening until it is too late.  Protect your feet from hot and cold by wearing shoes, such as at the beach or on hot pavement.  Schedule a complete foot exam at least once a year (annually) or more often if you have foot problems. If you have foot problems, report any cuts, sores, or bruises to your health care provider immediately. Contact a health care provider if:  You have a medical condition that increases your risk of infection and you have any cuts, sores, or bruises on your feet.  You have an injury that is not   healing.  You have redness on your legs or feet.  You feel burning or tingling in your legs or feet.  You have pain or cramps in your legs and feet.  Your legs or feet are numb.  Your feet always feel cold.  You have pain around a toenail. Get help right away if:  You have a wound, scrape, corn, or callus on your foot and: ? You have pain, swelling, or redness that gets worse. ? You have fluid or blood coming from the wound, scrape, corn, or callus. ? Your wound, scrape, corn, or callus feels warm to the touch. ? You have pus or a bad smell coming from the wound, scrape, corn, or callus. ? You have a fever. ? You have a red line going up your leg. Summary  Check your feet every day  for cuts, sores, red spots, swelling, and blisters.  Moisturize feet and legs daily.  Wear shoes that fit properly and have enough cushioning.  If you have foot problems, report any cuts, sores, or bruises to your health care provider immediately.  Schedule a complete foot exam at least once a year (annually) or more often if you have foot problems. This information is not intended to replace advice given to you by your health care provider. Make sure you discuss any questions you have with your health care provider. Document Revised: 04/20/2019 Document Reviewed: 08/29/2016 Elsevier Patient Education  2020 [employer]  

## 2020-08-14 NOTE — Progress Notes (Signed)
Subjective:    Patient ID: Jorge Garcia, male    DOB: Dec 20, 1945, 75 y.o.   MRN: [id number]   Chief Complaint: Medical Management of Chronic Issues    HPI:  1. Primary hypertension No c/o chest pain, sob or headache. Does not check blood pressure at home. BP Readings from Last 3 Encounters:  08/14/20 114/70  05/04/20 139/70  01/16/20 125/67     2. Pure hypercholesterolemia Tries to watch diet but doe snot do any dedicated exercise. Lab Results  Component Value Date   CHOL 103 05/04/2020   HDL 41 05/04/2020   LDLCALC 47 05/04/2020   TRIG 70 05/04/2020   CHOLHDL 2.5 05/04/2020     3. Type 2 diabetes mellitus without complication, without long-term current use of insulin (HCC) Fasting blood sugars usually run between 110-140.Marland Kitchen Does not watch diet. He denies any symotoms of low blood sugar. Lab Results  Component Value Date   HGBA1C 6.3 08/14/2020     4. Gastroesophageal reflux disease without esophagitis Omeprazole daily and works well.   5. BMI 29.0-29.9,adult No more weight changes Wt Readings from Last 3 Encounters:  08/14/20 214 lb (97.1 kg)  05/04/20 206 lb (93.4 kg)  01/16/20 204 lb (92.5 kg)   BMI Readings from Last 3 Encounters:  08/14/20 28.23 kg/m  05/04/20 27.18 kg/m  01/16/20 26.91 kg/m        Outpatient Encounter Medications as of 08/14/2020  Medication Sig  . Accu-Chek Softclix Lancets lancets Check glucose 4 times daily dx E11.9  . amLODipine (NORVASC) 10 MG tablet Take 1 tablet (10 mg total) by mouth daily.  Marland Kitchen aspirin EC 81 MG tablet Take 81 mg by mouth daily.  . benazepril (LOTENSIN) 40 MG tablet Take 1 tablet (40 mg total) by mouth daily.  . cholecalciferol (VITAMIN D) 1000 UNITS tablet Take 2,000 Units by mouth daily.   . fish oil-omega-3 fatty acids 1000 MG capsule Take 1 g by mouth daily.  Marland Kitchen glucose blood (ACCU-CHEK AVIVA PLUS) test strip Check glucose 4 times daily dx E11.9  . hydrochlorothiazide (HYDRODIURIL) 25 MG tablet  Take 1 tablet (25 mg total) by mouth daily.  Marland Kitchen ibuprofen (ADVIL) 400 MG tablet Take 1 tablet (400 mg total) by mouth every 6 (six) hours as needed.  . metFORMIN (GLUCOPHAGE) 1000 MG tablet TAKE 1 TABLET BY MOUTH TWICE DAILY WITH A MEAL  . omeprazole (PRILOSEC) 40 MG capsule TAKE ONE CAPSULE BY MOUTH ONCE DAILY BEFORE BREAKFAST  . rosuvastatin (CRESTOR) 10 MG tablet Take 1 tablet by mouth once daily     Past Surgical History:  Procedure Laterality Date  . COLONOSCOPY  12/01/2013  . [place]  . POLYPECTOMY    . UPPER GASTROINTESTINAL ENDOSCOPY      Family History  Problem Relation Age of Onset  . Diabetes Mother   . Hypertension Mother   . Stroke Mother 54  . Diabetes Father   . Hypertension Father   . Heart attack Father 28  . Colon cancer Neg Hx   . Colon polyps Neg Hx   . Esophageal cancer Neg Hx   . Stomach cancer Neg Hx   . Rectal cancer Neg Hx     New complaints: None today  Social history: Lives by hisself but has a significant other that he spends a lot of time with.  Controlled substance contract: n/a    Review of Systems  Constitutional: Negative for diaphoresis.  Eyes: Negative for pain.  Respiratory: Negative for shortness  of breath.   Cardiovascular: Negative for chest pain, palpitations and leg swelling.  Gastrointestinal: Negative for abdominal pain.  Endocrine: Negative for polydipsia.  Skin: Negative for rash.  Neurological: Negative for dizziness, weakness and headaches.  Hematological: Does not bruise/bleed easily.  All other systems reviewed and are negative.      Objective:   Physical Exam Vitals and nursing note reviewed.  Constitutional:      Appearance: Normal appearance. He is well-developed and well-nourished.  HENT:     Head: Normocephalic.     Nose: Nose normal.     Mouth/Throat:     Mouth: Oropharynx is clear and moist.  Eyes:     Extraocular Movements: EOM normal.     Pupils: Pupils are equal, round, and  reactive to light.  Neck:     Thyroid: No thyroid mass or thyromegaly.     Vascular: No carotid bruit or JVD.     Trachea: Phonation normal.  Cardiovascular:     Rate and Rhythm: Normal rate and regular rhythm.  Pulmonary:     Effort: Pulmonary effort is normal. No respiratory distress.     Breath sounds: Normal breath sounds.  Abdominal:     General: Bowel sounds are normal. Aorta is normal.     Palpations: Abdomen is soft.     Tenderness: There is no abdominal tenderness.  Musculoskeletal:        General: Normal range of motion.     Cervical back: Normal range of motion and neck supple.  Lymphadenopathy:     Cervical: No cervical adenopathy.  Skin:    General: Skin is warm and dry.  Neurological:     Mental Status: He is alert and oriented to person, place, and time.  Psychiatric:        Mood and Affect: Mood and affect normal.        Behavior: Behavior normal.        Thought Content: Thought content normal.        Judgment: Judgment normal.     BP 114/70   Pulse 70   Temp 98.5 F (36.9 C) (Temporal)   Resp 20   Ht '6\' 1"'  (1.854 m)   Wt 214 lb (97.1 kg)   SpO2 96%   BMI 28.23 kg/m   hgba1c 6.3%     Assessment & Plan:  Jorge Garcia comes in today with chief complaint of Medical Management of Chronic Issues   Diagnosis and orders addressed:  1. Primary hypertension Low sodium diet - CBC with Differential/Platelet - CMP14+EGFR  2. Pure hypercholesterolemia Low fat diet - Lipid panel  3. Type 2 diabetes mellitus without complication, without long-term current use of insulin (HCC) Continue to watch carbsin diet - Bayer DCA Hb A1c Waived - Microalbumin / creatinine urine ratio  4. Gastroesophageal reflux disease without esophagitis Avoid spicy foods Do not eat 2 hours prior to bedtime  5. BMI 29.0-29.9,adult Discussed diet and exercise for person with BMI >25 Will recheck weight in 3-6 months   Labs pending Health Maintenance reviewed Diet and  exercise encouraged  Follow up plan: 3 months   Mary-Margaret Hassell Done, FNP

## 2020-08-15 LAB — MICROALBUMIN / CREATININE URINE RATIO
Creatinine, Urine: 241.7 mg/dL
Microalb/Creat Ratio: 5 mg/g creat (ref 0–29)
Microalbumin, Urine: 13 ug/mL

## 2020-09-25 ENCOUNTER — Other Ambulatory Visit: Payer: Self-pay | Admitting: Nurse Practitioner

## 2020-09-25 DIAGNOSIS — I10 Essential (primary) hypertension: Secondary | ICD-10-CM

## 2020-10-11 ENCOUNTER — Encounter (HOSPITAL_COMMUNITY): Payer: Self-pay

## 2020-10-11 ENCOUNTER — Other Ambulatory Visit (HOSPITAL_COMMUNITY): Payer: Self-pay

## 2020-10-11 DIAGNOSIS — Z122 Encounter for screening for malignant neoplasm of respiratory organs: Secondary | ICD-10-CM

## 2020-10-11 DIAGNOSIS — Z87891 Personal history of nicotine dependence: Secondary | ICD-10-CM

## 2020-10-11 NOTE — Progress Notes (Signed)
Patient's low-dose CT on 03/30 at 1400. Patient aware

## 2020-10-15 ENCOUNTER — Other Ambulatory Visit: Payer: Self-pay | Admitting: Nurse Practitioner

## 2020-10-15 DIAGNOSIS — K219 Gastro-esophageal reflux disease without esophagitis: Secondary | ICD-10-CM

## 2020-10-15 DIAGNOSIS — E119 Type 2 diabetes mellitus without complications: Secondary | ICD-10-CM

## 2020-10-18 DIAGNOSIS — B351 Tinea unguium: Secondary | ICD-10-CM | POA: Diagnosis not present

## 2020-10-18 DIAGNOSIS — M79676 Pain in unspecified toe(s): Secondary | ICD-10-CM | POA: Diagnosis not present

## 2020-10-18 DIAGNOSIS — E1142 Type 2 diabetes mellitus with diabetic polyneuropathy: Secondary | ICD-10-CM | POA: Diagnosis not present

## 2020-10-18 DIAGNOSIS — L84 Corns and callosities: Secondary | ICD-10-CM | POA: Diagnosis not present

## 2020-11-01 ENCOUNTER — Other Ambulatory Visit: Payer: Self-pay | Admitting: Nurse Practitioner

## 2020-11-01 DIAGNOSIS — E7800 Pure hypercholesterolemia, unspecified: Secondary | ICD-10-CM

## 2020-11-07 ENCOUNTER — Ambulatory Visit (HOSPITAL_COMMUNITY)
Admission: RE | Admit: 2020-11-07 | Discharge: 2020-11-07 | Disposition: A | Discharge status: Home / Self Care | Payer: Medicare HMO | Source: Ambulatory Visit | Attending: Oncology | Admitting: Oncology

## 2020-11-07 ENCOUNTER — Other Ambulatory Visit: Payer: Self-pay

## 2020-11-07 DIAGNOSIS — Z122 Encounter for screening for malignant neoplasm of respiratory organs: Secondary | ICD-10-CM | POA: Diagnosis not present

## 2020-11-07 DIAGNOSIS — Z87891 Personal history of nicotine dependence: Secondary | ICD-10-CM

## 2020-11-08 ENCOUNTER — Encounter (HOSPITAL_COMMUNITY): Payer: Self-pay

## 2020-11-08 NOTE — Progress Notes (Signed)
Patient notified of LDCT Lung Cancer Screening Results via mail with the recommendation to follow-up in 12 months. Patient's referring provider has been sent a copy of results. Results are as follows:   IMPRESSION: 1. Lung-RADS 2, benign appearance or behavior. Continue annual screening with low-dose chest CT without contrast in 12 months. 2.  Emphysema (ICD10-J43.9) and Aortic Atherosclerosis (ICD10-170.0) 

## 2020-11-28 ENCOUNTER — Encounter: Payer: Self-pay | Admitting: Nurse Practitioner

## 2020-11-28 ENCOUNTER — Ambulatory Visit (INDEPENDENT_AMBULATORY_CARE_PROVIDER_SITE_OTHER): Payer: Medicare HMO | Admitting: Nurse Practitioner

## 2020-11-28 ENCOUNTER — Other Ambulatory Visit: Payer: Self-pay

## 2020-11-28 VITALS — BP 125/77 | HR 65 | Temp 97.9°F | Resp 20 | Ht 73.0 in | Wt 215.0 lb

## 2020-11-28 DIAGNOSIS — Z125 Encounter for screening for malignant neoplasm of prostate: Secondary | ICD-10-CM

## 2020-11-28 DIAGNOSIS — Z6829 Body mass index (BMI) 29.0-29.9, adult: Secondary | ICD-10-CM

## 2020-11-28 DIAGNOSIS — E119 Type 2 diabetes mellitus without complications: Secondary | ICD-10-CM

## 2020-11-28 DIAGNOSIS — E7800 Pure hypercholesterolemia, unspecified: Secondary | ICD-10-CM

## 2020-11-28 DIAGNOSIS — I10 Essential (primary) hypertension: Secondary | ICD-10-CM | POA: Diagnosis not present

## 2020-11-28 DIAGNOSIS — K219 Gastro-esophageal reflux disease without esophagitis: Secondary | ICD-10-CM | POA: Diagnosis not present

## 2020-11-28 LAB — BAYER DCA HB A1C WAIVED: HB A1C (BAYER DCA - WAIVED): 6.5 % (ref <7.0)

## 2020-11-28 MED ORDER — BENAZEPRIL HCL 40 MG PO TABS
40.0000 mg | ORAL_TABLET | Freq: Every day | ORAL | 1 refills | Status: DC
Start: 2020-11-28 — End: 2021-05-31

## 2020-11-28 MED ORDER — ROSUVASTATIN CALCIUM 10 MG PO TABS: 10.0000 mg | ORAL_TABLET | Freq: Every day | ORAL | 0 refills | Status: DC

## 2020-11-28 MED ORDER — METFORMIN HCL 1000 MG PO TABS: 1.0000 | ORAL_TABLET | Freq: Two times a day (BID) | ORAL | 0 refills | Status: DC

## 2020-11-28 MED ORDER — HYDROCHLOROTHIAZIDE 25 MG PO TABS: 1.0000 | ORAL_TABLET | Freq: Every day | ORAL | 1 refills | Status: DC

## 2020-11-28 MED ORDER — OMEPRAZOLE 40 MG PO CPDR: DELAYED_RELEASE_CAPSULE | ORAL | 0 refills | Status: DC

## 2020-11-28 MED ORDER — AMLODIPINE BESYLATE 10 MG PO TABS
10.0000 mg | ORAL_TABLET | Freq: Every day | ORAL | 1 refills | Status: DC
Start: 2020-11-28 — End: 2021-05-31

## 2020-11-28 NOTE — Progress Notes (Signed)
Subjective:    Patient ID: Jorge Garcia, male    DOB: September 08, 1945, 75 y.o.   MRN: [id number]   Chief Complaint: Medical Management of Chronic Issues    HPI:  1. Primary hypertension No c/o chest pain, sob or headache. Does not check blood pressure at home. BP Readings from Last 3 Encounters:  11/28/20 125/77  08/14/20 114/70  05/04/20 139/70     2. Pure hypercholesterolemia Doe snot watch diet and does no dedicated exercise. Lab Results  Component Value Date   CHOL 102 08/14/2020   HDL 41 08/14/2020   LDLCALC 46 08/14/2020   TRIG 69 08/14/2020   CHOLHDL 2.5 08/14/2020    3. Type 2 diabetes mellitus without complication, without long-term current use of insulin (HCC) His fasting blood usgars are usually in the 120-130. He denies any low blood sugars. Lab Results  Component Value Date   HGBA1C 6.3 08/14/2020     4. Prostate cancer screening No problems voiding. Stream a little weaker. Lab Results  Component Value Date   PSA1 2.2 10/13/2019   PSA1 1.4 08/19/2016   PSA 1.8 05/27/2013      5. Gastroesophageal reflux disease without esophagitis Takes omeprazole daily and that works well to keep symptoms under control  6. BMI 29.0-29.9,adult No recent weight changes Wt Readings from Last 3 Encounters:  11/28/20 215 lb (97.5 kg)  08/14/20 214 lb (97.1 kg)  05/04/20 206 lb (93.4 kg)   BMI Readings from Last 3 Encounters:  11/28/20 28.37 kg/m  08/14/20 28.23 kg/m  05/04/20 27.18 kg/m       Outpatient Encounter Medications as of 11/28/2020  Medication Sig  . Accu-Chek Softclix Lancets lancets Check glucose 4 times daily dx E11.9  . amLODipine (NORVASC) 10 MG tablet Take 1 tablet (10 mg total) by mouth daily.  Marland Kitchen aspirin EC 81 MG tablet Take 81 mg by mouth daily.  . benazepril (LOTENSIN) 40 MG tablet Take 1 tablet (40 mg total) by mouth daily.  . cholecalciferol (VITAMIN D) 1000 UNITS tablet Take 2,000 Units by mouth daily.   . fish oil-omega-3 fatty  acids 1000 MG capsule Take 1 g by mouth daily.  Marland Kitchen glucose blood (ACCU-CHEK AVIVA PLUS) test strip Check glucose 4 times daily dx E11.9  . hydrochlorothiazide (HYDRODIURIL) 25 MG tablet Take 1 tablet by mouth once daily  . ibuprofen (ADVIL) 400 MG tablet Take 1 tablet (400 mg total) by mouth every 6 (six) hours as needed.  . metFORMIN (GLUCOPHAGE) 1000 MG tablet TAKE 1 TABLET BY MOUTH TWICE DAILY WITH A MEAL  . omeprazole (PRILOSEC) 40 MG capsule TAKE 1 CAPSULE BY MOUTH ONCE DAILY BEFORE BREAKFAST  . rosuvastatin (CRESTOR) 10 MG tablet Take 1 tablet by mouth once daily   No facility-administered encounter medications on file as of 11/28/2020.    Past Surgical History:  Procedure Laterality Date  . COLONOSCOPY  12/01/2013  . [place]  . POLYPECTOMY    . UPPER GASTROINTESTINAL ENDOSCOPY      Family History  Problem Relation Age of Onset  . Diabetes Mother   . Hypertension Mother   . Stroke Mother 9  . Diabetes Father   . Hypertension Father   . Heart attack Father 21  . Colon cancer Neg Hx   . Colon polyps Neg Hx   . Esophageal cancer Neg Hx   . Stomach cancer Neg Hx   . Rectal cancer Neg Hx     New complaints: None today  Social  history: Lives by hisself  Controlled substance contract: n/a    Review of Systems  Constitutional: Negative for diaphoresis.  Eyes: Negative for pain.  Respiratory: Negative for shortness of breath.   Cardiovascular: Negative for chest pain, palpitations and leg swelling.  Gastrointestinal: Negative for abdominal pain.  Endocrine: Negative for polydipsia.  Skin: Negative for rash.  Neurological: Negative for dizziness, weakness and headaches.  Hematological: Does not bruise/bleed easily.  All other systems reviewed and are negative.      Objective:   Physical Exam Vitals and nursing note reviewed.  Constitutional:      Appearance: Normal appearance. He is well-developed.  HENT:     Head: Normocephalic.     Nose:  Nose normal.  Eyes:     Pupils: Pupils are equal, round, and reactive to light.  Neck:     Thyroid: No thyroid mass or thyromegaly.     Vascular: No carotid bruit or JVD.     Trachea: Phonation normal.  Cardiovascular:     Rate and Rhythm: Normal rate and regular rhythm.  Pulmonary:     Effort: Pulmonary effort is normal. No respiratory distress.     Breath sounds: Normal breath sounds.  Abdominal:     General: Bowel sounds are normal.     Palpations: Abdomen is soft.     Tenderness: There is no abdominal tenderness.  Musculoskeletal:        General: Normal range of motion.     Cervical back: Normal range of motion and neck supple.  Lymphadenopathy:     Cervical: No cervical adenopathy.  Skin:    General: Skin is warm and dry.  Neurological:     Mental Status: He is alert and oriented to person, place, and time.  Psychiatric:        Behavior: Behavior normal.        Thought Content: Thought content normal.        Judgment: Judgment normal.    BP 125/77   Pulse 65   Temp 97.9 F (36.6 C) (Temporal)   Resp 20   Ht '6\' 1"'  (1.854 m)   Wt 215 lb (97.5 kg)   SpO2 98%   BMI 28.37 kg/m   Hgba1c discussed at appointment 6.5%      Assessment & Plan:  [name] comes in today with chief complaint of Medical Management of Chronic Issues   Diagnosis and orders addressed:  1. Primary hypertension Low sodium diet - CBC with Differential/Platelet - CMP14+EGFR - hydrochlorothiazide (HYDRODIURIL) 25 MG tablet; Take 1 tablet (25 mg total) by mouth daily.  Dispense: 90 tablet; Refill: 1 - benazepril (LOTENSIN) 40 MG tablet; Take 1 tablet (40 mg total) by mouth daily.  Dispense: 90 tablet; Refill: 1 - amLODipine (NORVASC) 10 MG tablet; Take 1 tablet (10 mg total) by mouth daily.  Dispense: 90 tablet; Refill: 1  2. Pure hypercholesterolemia Low fat diet - Lipid panel - rosuvastatin (CRESTOR) 10 MG tablet; Take 1 tablet (10 mg total) by mouth daily.  Dispense: 90 tablet;  Refill: 0  3. Type 2 diabetes mellitus without complication, without long-term current use of insulin (HCC) Continue to watch carbs in diet - Bayer DCA Hb A1c Waived - metFORMIN (GLUCOPHAGE) 1000 MG tablet; Take 1 tablet (1,000 mg total) by mouth 2 (two) times daily with a meal.  Dispense: 180 tablet; Refill: 0  4. Prostate cancer screening - PSA, total and free  5. Gastroesophageal reflux disease without esophagitis Avoid spicy foods Do not eat  2 hours prior to bedtime - omeprazole (PRILOSEC) 40 MG capsule; TAKE 1 CAPSULE BY MOUTH ONCE DAILY BEFORE BREAKFAST  Dispense: 90 capsule; Refill: 0  6. BMI 29.0-29.9,adult Discussed diet and exercise for person with BMI >25 Will recheck weight in 3-6 months    Labs pending Health Maintenance reviewed Diet and exercise encouraged  Follow up plan: 6 months   Mary-Margaret Hassell Done, FNP

## 2020-11-28 NOTE — Patient Instructions (Signed)

## 2020-11-29 LAB — PSA, TOTAL AND FREE
PSA, Free Pct: 40.4 %
PSA, Free: 1.05 ng/mL
Prostate Specific Ag, Serum: 2.6 ng/mL (ref 0.0–4.0)

## 2020-11-29 LAB — CBC WITH DIFFERENTIAL/PLATELET
Basophils Absolute: 0.1 10*3/uL (ref 0.0–0.2)
Basos: 1 %
EOS (ABSOLUTE): 0.4 10*3/uL (ref 0.0–0.4)
Eos: 5 %
Hematocrit: 40.9 % (ref 37.5–51.0)
Hemoglobin: 14 g/dL (ref 13.0–17.7)
Immature Grans (Abs): 0 10*3/uL (ref 0.0–0.1)
Immature Granulocytes: 0 %
Lymphocytes Absolute: 2 10*3/uL (ref 0.7–3.1)
Lymphs: 26 %
MCH: 29.2 pg (ref 26.6–33.0)
MCHC: 34.2 g/dL (ref 31.5–35.7)
MCV: 85 fL (ref 79–97)
Monocytes Absolute: 0.7 10*3/uL (ref 0.1–0.9)
Monocytes: 8 %
Neutrophils Absolute: 4.7 10*3/uL (ref 1.4–7.0)
Neutrophils: 60 %
Platelets: 238 10*3/uL (ref 150–450)
RBC: 4.8 x10E6/uL (ref 4.14–5.80)
RDW: 13.2 % (ref 11.6–15.4)
WBC: 7.8 10*3/uL (ref 3.4–10.8)

## 2020-11-29 LAB — CMP14+EGFR
ALT: 16 IU/L (ref 0–44)
AST: 15 IU/L (ref 0–40)
Albumin/Globulin Ratio: 2 (ref 1.2–2.2)
Albumin: 4.6 g/dL (ref 3.7–4.7)
Alkaline Phosphatase: 53 IU/L (ref 44–121)
BUN/Creatinine Ratio: 11 (ref 10–24)
BUN: 13 mg/dL (ref 8–27)
Bilirubin Total: 1.5 mg/dL — ABNORMAL HIGH (ref 0.0–1.2)
CO2: 24 mmol/L (ref 20–29)
Calcium: 9.7 mg/dL (ref 8.6–10.2)
Chloride: 103 mmol/L (ref 96–106)
Creatinine, Ser: 1.14 mg/dL (ref 0.76–1.27)
Globulin, Total: 2.3 g/dL (ref 1.5–4.5)
Glucose: 132 mg/dL — ABNORMAL HIGH (ref 65–99)
Potassium: 4.7 mmol/L (ref 3.5–5.2)
Sodium: 141 mmol/L (ref 134–144)
Total Protein: 6.9 g/dL (ref 6.0–8.5)
eGFR: 68 mL/min/{1.73_m2} (ref >59)

## 2020-11-29 LAB — LIPID PANEL
Chol/HDL Ratio: 2.5 ratio (ref 0.0–5.0)
Cholesterol, Total: 105 mg/dL (ref 100–199)
HDL: 42 mg/dL (ref >39)
LDL Chol Calc (NIH): 46 mg/dL (ref 0–99)
Triglycerides: 88 mg/dL (ref 0–149)
VLDL Cholesterol Cal: 17 mg/dL (ref 5–40)

## 2020-12-27 DIAGNOSIS — L84 Corns and callosities: Secondary | ICD-10-CM | POA: Diagnosis not present

## 2020-12-27 DIAGNOSIS — B351 Tinea unguium: Secondary | ICD-10-CM | POA: Diagnosis not present

## 2020-12-27 DIAGNOSIS — E1142 Type 2 diabetes mellitus with diabetic polyneuropathy: Secondary | ICD-10-CM | POA: Diagnosis not present

## 2020-12-27 DIAGNOSIS — M79676 Pain in unspecified toe(s): Secondary | ICD-10-CM | POA: Diagnosis not present

## 2021-01-28 ENCOUNTER — Other Ambulatory Visit: Payer: Self-pay | Admitting: Nurse Practitioner

## 2021-01-28 DIAGNOSIS — E7800 Pure hypercholesterolemia, unspecified: Secondary | ICD-10-CM

## 2021-01-28 DIAGNOSIS — K219 Gastro-esophageal reflux disease without esophagitis: Secondary | ICD-10-CM

## 2021-02-06 ENCOUNTER — Ambulatory Visit (INDEPENDENT_AMBULATORY_CARE_PROVIDER_SITE_OTHER): Payer: Medicare HMO | Admitting: Family Medicine

## 2021-02-06 ENCOUNTER — Encounter: Payer: Self-pay | Admitting: Family Medicine

## 2021-02-06 ENCOUNTER — Other Ambulatory Visit: Payer: Self-pay

## 2021-02-06 DIAGNOSIS — J069 Acute upper respiratory infection, unspecified: Secondary | ICD-10-CM

## 2021-02-06 DIAGNOSIS — R509 Fever, unspecified: Secondary | ICD-10-CM | POA: Diagnosis not present

## 2021-02-06 MED ORDER — PREDNISONE 20 MG PO TABS: 20.0000 mg | ORAL_TABLET | Freq: Every day | ORAL | 0 refills | Status: AC

## 2021-02-06 MED ORDER — BENZONATATE 100 MG PO CAPS: 100.0000 mg | ORAL_CAPSULE | Freq: Three times a day (TID) | ORAL | 0 refills | Status: DC | PRN

## 2021-02-06 MED ORDER — GUAIFENESIN ER 600 MG PO TB12: 600.0000 mg | ORAL_TABLET | Freq: Two times a day (BID) | ORAL | 0 refills | Status: DC | PRN

## 2021-02-06 NOTE — Progress Notes (Signed)
Virtual Visit  Note Due to COVID-19 pandemic this visit was conducted virtually. This visit type was conducted due to national recommendations for restrictions regarding the COVID-19 Pandemic (e.g. social distancing, sheltering in place) in an effort to limit this patient's exposure and mitigate transmission in our community. All issues noted in this document were discussed and addressed.  A physical exam was not performed with this format.  I connected with Riki Gehring on 02/06/21 at 1450 by telephone and verified that I am speaking with the correct person using two identifiers. Ozell Juhasz is currently located at home and no one is currently with him during the visit. The provider, Gwenlyn Perking, FNP is located in their office at time of visit.  I discussed the limitations, risks, security and privacy concerns of performing an evaluation and management service by telephone and the availability of in person appointments. I also discussed with the patient that there may be a patient responsible charge related to this service. The patient expressed understanding and agreed to proceed.  CC: congestion  History and Present Illness:  HPI Veron report nasal congestion and cough x 4 days. His temperature today was 100.7. He denies chest pain, shortness of breath, chills, nausea, vomiting, diarrhea, sore throat, ear pain, or head ache. He has taken nyquil and alka seltzer cold plus with little relief. He denies known exposure to Covid. He has been vaccinated and had 1 booster.     ROS As per HPI.   Observations/Objective: Alert and oriented x 3. Able to speak in full sentences without difficulty.   Assessment and Plan: Benzion was seen today for nasal congestion.  Diagnoses and all orders for this visit:  Fever, unspecified fever cause Covid test pending, quarantine until results. Tylenol as needed for fever. Stay well hydrated, rest.  -     Novel Coronavirus, NAA ([facility]);  Future  URI with cough and congestion Covid test pending. Prednisone burst given. Tessalon perles as needed for cough. Mucinex for congestion. Rest, stay well hydrated.  -     predniSONE (DELTASONE) 20 MG tablet; Take 1 tablet (20 mg total) by mouth daily with breakfast for 5 days. -     benzonatate (TESSALON PERLES) 100 MG capsule; Take 1 capsule (100 mg total) by mouth 3 (three) times daily as needed for cough. -     guaiFENesin (MUCINEX) 600 MG 12 hr tablet; Take 1-2 tablets (600-1,200 mg total) by mouth 2 (two) times daily as needed for cough or to loosen phlegm. -     Novel Coronavirus, NAA ([facility]); Future   Follow Up Instructions: Return to office for new or worsening symptoms, or if symptoms persist.    I discussed the assessment and treatment plan with the patient. The patient was provided an opportunity to ask questions and all were answered. The patient agreed with the plan and demonstrated an understanding of the instructions.   The patient was advised to call back or seek an in-person evaluation if the symptoms worsen or if the condition fails to improve as anticipated.  The above assessment and management plan was discussed with the patient. The patient verbalized understanding of and has agreed to the management plan. Patient is aware to call the clinic if symptoms persist or worsen. Patient is aware when to return to the clinic for a follow-up visit. Patient educated on when it is appropriate to go to the emergency department.   Time call ended:  1502  I provided 12 minutes of  non face-to-face time during this encounter.    Gwenlyn Perking, FNP

## 2021-02-07 LAB — NOVEL CORONAVIRUS, NAA: SARS-CoV-2, NAA: NOT DETECTED

## 2021-02-07 LAB — SARS-COV-2, NAA 2 DAY TAT

## 2021-02-28 ENCOUNTER — Other Ambulatory Visit: Payer: Self-pay | Admitting: Nurse Practitioner

## 2021-03-07 DIAGNOSIS — L84 Corns and callosities: Secondary | ICD-10-CM | POA: Diagnosis not present

## 2021-03-07 DIAGNOSIS — M79676 Pain in unspecified toe(s): Secondary | ICD-10-CM | POA: Diagnosis not present

## 2021-03-07 DIAGNOSIS — B351 Tinea unguium: Secondary | ICD-10-CM | POA: Diagnosis not present

## 2021-03-07 DIAGNOSIS — E1142 Type 2 diabetes mellitus with diabetic polyneuropathy: Secondary | ICD-10-CM | POA: Diagnosis not present

## 2021-04-15 ENCOUNTER — Other Ambulatory Visit: Payer: Self-pay | Admitting: Nurse Practitioner

## 2021-04-15 DIAGNOSIS — E119 Type 2 diabetes mellitus without complications: Secondary | ICD-10-CM

## 2021-04-29 ENCOUNTER — Other Ambulatory Visit: Payer: Self-pay | Admitting: Nurse Practitioner

## 2021-04-29 DIAGNOSIS — E7800 Pure hypercholesterolemia, unspecified: Secondary | ICD-10-CM

## 2021-04-29 DIAGNOSIS — K219 Gastro-esophageal reflux disease without esophagitis: Secondary | ICD-10-CM

## 2021-05-16 DIAGNOSIS — M79676 Pain in unspecified toe(s): Secondary | ICD-10-CM | POA: Diagnosis not present

## 2021-05-16 DIAGNOSIS — L03032 Cellulitis of left toe: Secondary | ICD-10-CM | POA: Diagnosis not present

## 2021-05-28 ENCOUNTER — Ambulatory Visit (INDEPENDENT_AMBULATORY_CARE_PROVIDER_SITE_OTHER): Payer: Medicare HMO

## 2021-05-28 VITALS — Ht 73.0 in | Wt 215.0 lb

## 2021-05-28 DIAGNOSIS — Z0000 Encounter for general adult medical examination without abnormal findings: Secondary | ICD-10-CM

## 2021-05-28 NOTE — Patient Instructions (Signed)
Jorge Garcia , Thank you for taking time to come for your Medicare Wellness Visit. I appreciate your ongoing commitment to your health goals. Please review the following plan we discussed and let me know if I can assist you in the future.   Screening recommendations/referrals: Colonoscopy: Done 01/20/2019 - Repeat in 3 years Recommended yearly ophthalmology/optometry visit for glaucoma screening and checkup Recommended yearly dental visit for hygiene and checkup  Vaccinations: Influenza vaccine: Done 05/04/2020 - Repeat annually  Pneumococcal vaccine: Done 05/27/2013 & 12/22/2014 Tdap vaccine: Done 12/06/2018 - Repeat in 10 years  Shingles vaccine: Due   Covid-19: Done 09/14/2019, 10/12/2019, & 06/19/2020  Advanced directives: Please bring a copy of your health care power of attorney and living will to the office to be added to your chart at your convenience.   Conditions/risks identified: Aim for 30 minutes of exercise or brisk walking each day, drink 6-8 glasses of water and eat lots of fruits and vegetables.   Next appointment: Follow up in one year for your annual wellness visit.   Preventive Care 73 Years and Older, Male  Preventive care refers to lifestyle choices and visits with your health care provider that can promote health and wellness. What does preventive care include? A yearly physical exam. This is also called an annual well check. Dental exams once or twice a year. Routine eye exams. Ask your health care provider how often you should have your eyes checked. Personal lifestyle choices, including: Daily care of your teeth and gums. Regular physical activity. Eating a healthy diet. Avoiding tobacco and drug use. Limiting alcohol use. Practicing safe sex. Taking low doses of aspirin every day. Taking vitamin and mineral supplements as recommended by your health care provider. What happens during an annual well check? The services and screenings done by your health care  provider during your annual well check will depend on your age, overall health, lifestyle risk factors, and family history of disease. Counseling  Your health care provider may ask you questions about your: Alcohol use. Tobacco use. Drug use. Emotional well-being. Home and relationship well-being. Sexual activity. Eating habits. History of falls. Memory and ability to understand (cognition). Work and work Statistician. Screening  You may have the following tests or measurements: Height, weight, and BMI. Blood pressure. Lipid and cholesterol levels. These may be checked every 5 years, or more frequently if you are over 59 years old. Skin check. Lung cancer screening. You may have this screening every year starting at age 47 if you have a 30-pack-year history of smoking and currently smoke or have quit within the past 15 years. Fecal occult blood test (FOBT) of the stool. You may have this test every year starting at age 47. Flexible sigmoidoscopy or colonoscopy. You may have a sigmoidoscopy every 5 years or a colonoscopy every 10 years starting at age 77. Prostate cancer screening. Recommendations will vary depending on your family history and other risks. Hepatitis C blood test. Hepatitis B blood test. Sexually transmitted disease (STD) testing. Diabetes screening. This is done by checking your blood sugar (glucose) after you have not eaten for a while (fasting). You may have this done every 1-3 years. Abdominal aortic aneurysm (AAA) screening. You may need this if you are a current or former smoker. Osteoporosis. You may be screened starting at age 52 if you are at high risk. Talk with your health care provider about your test results, treatment options, and if necessary, the need for more tests. Vaccines  Your health care  provider may recommend certain vaccines, such as: Influenza vaccine. This is recommended every year. Tetanus, diphtheria, and acellular pertussis (Tdap, Td)  vaccine. You may need a Td booster every 10 years. Zoster vaccine. You may need this after age 12. Pneumococcal 13-valent conjugate (PCV13) vaccine. One dose is recommended after age 76. Pneumococcal polysaccharide (PPSV23) vaccine. One dose is recommended after age 69. Talk to your health care provider about which screenings and vaccines you need and how often you need them. This information is not intended to replace advice given to you by your health care provider. Make sure you discuss any questions you have with your health care provider. Document Released: 08/24/2015 Document Revised: 04/16/2016 Document Reviewed: 05/29/2015 Elsevier Interactive Patient Education  2017 [place] Prevention in the Home Falls can cause injuries. They can happen to people of all ages. There are many things you can do to make your home safe and to help prevent falls. What can I do on the outside of my home? Regularly fix the edges of walkways and driveways and fix any cracks. Remove anything that might make you trip as you walk through a door, such as a raised step or threshold. Trim any bushes or trees on the path to your home. Use bright outdoor lighting. Clear any walking paths of anything that might make someone trip, such as rocks or tools. Regularly check to see if handrails are loose or broken. Make sure that both sides of any steps have handrails. Any raised decks and porches should have guardrails on the edges. Have any leaves, snow, or ice cleared regularly. Use sand or salt on walking paths during winter. Clean up any spills in your garage right away. This includes oil or grease spills. What can I do in the bathroom? Use night lights. Install grab bars by the toilet and in the tub and shower. Do not use towel bars as grab bars. Use non-skid mats or decals in the tub or shower. If you need to sit down in the shower, use a plastic, non-slip stool. Keep the floor dry. Clean up any  water that spills on the floor as soon as it happens. Remove soap buildup in the tub or shower regularly. Attach bath mats securely with double-sided non-slip rug tape. Do not have throw rugs and other things on the floor that can make you trip. What can I do in the bedroom? Use night lights. Make sure that you have a light by your bed that is easy to reach. Do not use any sheets or blankets that are too big for your bed. They should not hang down onto the floor. Have a firm chair that has side arms. You can use this for support while you get dressed. Do not have throw rugs and other things on the floor that can make you trip. What can I do in the kitchen? Clean up any spills right away. Avoid walking on wet floors. Keep items that you use a lot in easy-to-reach places. If you need to reach something above you, use a strong step stool that has a grab bar. Keep electrical cords out of the way. Do not use floor polish or wax that makes floors slippery. If you must use wax, use non-skid floor wax. Do not have throw rugs and other things on the floor that can make you trip. What can I do with my stairs? Do not leave any items on the stairs. Make sure that there are handrails on both  sides of the stairs and use them. Fix handrails that are broken or loose. Make sure that handrails are as long as the stairways. Check any carpeting to make sure that it is firmly attached to the stairs. Fix any carpet that is loose or worn. Avoid having throw rugs at the top or bottom of the stairs. If you do have throw rugs, attach them to the floor with carpet tape. Make sure that you have a light switch at the top of the stairs and the bottom of the stairs. If you do not have them, ask someone to add them for you. What else can I do to help prevent falls? Wear shoes that: Do not have high heels. Have rubber bottoms. Are comfortable and fit you well. Are closed at the toe. Do not wear sandals. If you use a  stepladder: Make sure that it is fully opened. Do not climb a closed stepladder. Make sure that both sides of the stepladder are locked into place. Ask someone to hold it for you, if possible. Clearly mark and make sure that you can see: Any grab bars or handrails. First and last steps. Where the edge of each step is. Use tools that help you move around (mobility aids) if they are needed. These include: Canes. Walkers. Scooters. Crutches. Turn on the lights when you go into a dark area. Replace any light bulbs as soon as they burn out. Set up your furniture so you have a clear path. Avoid moving your furniture around. If any of your floors are uneven, fix them. If there are any pets around you, be aware of where they are. Review your medicines with your doctor. Some medicines can make you feel dizzy. This can increase your chance of falling. Ask your doctor what other things that you can do to help prevent falls. This information is not intended to replace advice given to you by your health care provider. Make sure you discuss any questions you have with your health care provider. Document Released: 05/24/2009 Document Revised: 01/03/2016 Document Reviewed: 09/01/2014 Elsevier Interactive Patient Education  2017 [employer].

## 2021-05-28 NOTE — Progress Notes (Signed)
Subjective:   Tome Wilson is a 75 y.o. male who presents for Medicare Annual/Subsequent preventive examination.  Virtual Visit via Telephone Note  I connected with  Jud Fanguy on 05/28/21 at  3:30 PM EDT by telephone and verified that I am speaking with the correct person using two identifiers.  Location: Patient: Home Provider: [facility] Persons participating in the virtual visit: patient/Nurse Health Advisor   I discussed the limitations, risks, security and privacy concerns of performing an evaluation and management service by telephone and the availability of in person appointments. The patient expressed understanding and agreed to proceed.  Interactive audio and video telecommunications were attempted between this nurse and patient, however failed, due to patient having technical difficulties OR patient did not have access to video capability.  We continued and completed visit with audio only.  Some vital signs may be absent or patient reported.   Sadie Pickar E Jordy Hewins, LPN   Review of Systems     Cardiac Risk Factors include: advanced age (>35men, >36 women);diabetes mellitus;dyslipidemia;hypertension;male gender;sedentary lifestyle;Other (see comment), Risk factor comments: emphysema, atherosclerosis     Objective:    Today's Vitals   05/28/21 1454  Weight: 215 lb (97.5 kg)  Height: 6\' 1"  (1.854 m)   Body mass index is 28.37 kg/m.  Advanced Directives 05/28/2021 01/31/2019 12/06/2018 01/27/2018 01/26/2017 06/12/2014 06/12/2014  Does Patient Have a Medical Advance Directive? Yes Yes Yes Yes Yes Yes Yes  Type of Paramedic of [place];Living will Living will Living will [place];Living will [place];Living will - Living will  Does patient want to make changes to medical advance directive? - No - Patient declined No - Patient declined No - Patient declined - - -  Copy of [place] in Chart? No - copy  requested - - No - copy requested No - copy requested No - copy requested No - copy requested    Current Medications (verified) Outpatient Encounter Medications as of 05/28/2021  Medication Sig   Accu-Chek Softclix Lancets lancets USE 1  TO CHECK GLUCOSE 4 TIMES DAILY   amLODipine (NORVASC) 10 MG tablet Take 1 tablet (10 mg total) by mouth daily.   aspirin EC 81 MG tablet Take 81 mg by mouth daily.   benazepril (LOTENSIN) 40 MG tablet Take 1 tablet (40 mg total) by mouth daily.   benzonatate (TESSALON PERLES) 100 MG capsule Take 1 capsule (100 mg total) by mouth 3 (three) times daily as needed for cough.   cholecalciferol (VITAMIN D) 1000 UNITS tablet Take 2,000 Units by mouth daily.    fish oil-omega-3 fatty acids 1000 MG capsule Take 1 g by mouth daily.   glucose blood (ACCU-CHEK AVIVA PLUS) test strip Check glucose 4 times daily dx E11.9   guaiFENesin (MUCINEX) 600 MG 12 hr tablet Take 1-2 tablets (600-1,200 mg total) by mouth 2 (two) times daily as needed for cough or to loosen phlegm.   hydrochlorothiazide (HYDRODIURIL) 25 MG tablet Take 1 tablet (25 mg total) by mouth daily.   ibuprofen (ADVIL) 400 MG tablet Take 1 tablet (400 mg total) by mouth every 6 (six) hours as needed.   metFORMIN (GLUCOPHAGE) 1000 MG tablet TAKE 1 TABLET BY MOUTH TWICE DAILY WITH A MEAL   omeprazole (PRILOSEC) 40 MG capsule TAKE 1 CAPSULE BY MOUTH ONCE DAILY BEFORE BREAKFAST   rosuvastatin (CRESTOR) 10 MG tablet Take 1 tablet by mouth once daily   No facility-administered encounter medications on file as of 05/28/2021.  Allergies (verified) Patient has no known allergies.   History: Past Medical History:  Diagnosis Date   Diabetes mellitus without complication ([place])    History of CVA (cerebrovascular accident)    Hyperlipidemia    Hypertension    Internal hemorrhoids    Tubulovillous adenoma of colon 2009   Vitamin D deficiency    Past Surgical History:  Procedure Laterality Date   COLONOSCOPY   12/01/2013   HEMORRHOID SURGERY  1978   POLYPECTOMY     UPPER GASTROINTESTINAL ENDOSCOPY     Family History  Problem Relation Age of Onset   Diabetes Mother    Hypertension Mother    Stroke Mother 37   Diabetes Father    Hypertension Father    Heart attack Father 1   Colon cancer Neg Hx    Colon polyps Neg Hx    Esophageal cancer Neg Hx    Stomach cancer Neg Hx    Rectal cancer Neg Hx    Social History   Socioeconomic History   Marital status: Single    Spouse name: Not on file   Number of children: 0   Years of education: 12   Highest education level: 12th grade  Occupational History   Occupation: Retired    Fish farm manager: [employer]  Tobacco Use   Smoking status: Former    Packs/day: 1.00    Years: 40.00    Pack years: 40.00    Types: Cigarettes    Quit date: 11/09/2005    Years since quitting: 15.5   Smokeless tobacco: Never  Vaping Use   Vaping Use: Never used  Substance and Sexual Activity   Alcohol use: No   Drug use: No   Sexual activity: Not Currently  Other Topics Concern   Not on file  Social History Narrative   Lives alone. No children    VA benefits   Social Determinants of Health   Financial Resource Strain: Low Risk    Difficulty of Paying Living Expenses: Not hard at all  Food Insecurity: No Food Insecurity   Worried About Charity fundraiser in the Last Year: Never true   Arboriculturist in the Last Year: Never true  Transportation Needs: No Transportation Needs   Lack of Transportation (Medical): No   Lack of Transportation (Non-Medical): No  Physical Activity: Insufficiently Active   Days of Exercise per Week: 4 days   Minutes of Exercise per Session: 30 min  Stress: No Stress Concern Present   Feeling of Stress : Not at all  Social Connections: Moderately Integrated   Frequency of Communication with Friends and Family: More than three times a week   Frequency of Social Gatherings with Friends and Family: More than three times a  week   Attends Religious Services: More than 4 times per year   Active Member of [employer] or Organizations: Yes   Attends Music therapist: More than 4 times per year   Marital Status: Divorced    Tobacco Counseling Counseling given: Not Answered   Clinical Intake:  Pre-visit preparation completed: Yes  Pain : No/denies pain     BMI - recorded: 28.37 Nutritional Status: BMI 25 -29 Overweight Nutritional Risks: None Diabetes: Yes CBG done?: No Did pt. bring in CBG monitor from home?: No  How often do you need to have someone help you when you read instructions, pamphlets, or other written materials from your doctor or pharmacy?: 1 - Never  Diabetic? Yes Nutrition Risk Assessment:  Has the patient had any N/V/D within the last 2 months?  No  Does the patient have any non-healing wounds?  No  Has the patient had any unintentional weight loss or weight gain?  No   Diabetes:  Is the patient diabetic?  Yes  If diabetic, was a CBG obtained today?  No  Did the patient bring in their glucometer from home?  No  How often do you monitor your CBG's? Once daily fasting - 152 this am.   Financial Strains and Diabetes Management:  Are you having any financial strains with the device, your supplies or your medication? No .  Does the patient want to be seen by Chronic Care Management for management of their diabetes?  No  Would the patient like to be referred to a Nutritionist or for Diabetic Management?  No   Diabetic Exams:  Diabetic Eye Exam: Completed 06/05/2020.   Diabetic Foot Exam: Completed 08/14/2020. Pt has been advised about the importance in completing this exam. Pt is scheduled for diabetic foot exam on next year.    Interpreter Needed?: No  Information entered by :: Noora Locascio, LPN   Activities of Daily Living In your present state of health, do you have any difficulty performing the following activities: 05/28/2021 11/28/2020  Hearing? N N  Vision?  N N  Difficulty concentrating or making decisions? N N  Walking or climbing stairs? N N  Dressing or bathing? N N  Doing errands, shopping? N N  Preparing Food and eating ? N -  Using the Toilet? N -  In the past six months, have you accidently leaked urine? N -  Do you have problems with loss of bowel control? N -  Managing your Medications? N -  Managing your Finances? N -  Housekeeping or managing your Housekeeping? N -  Some recent data might be hidden    Patient Care Team: Chevis Pretty, FNP as PCP - General (Nurse Practitioner)  Indicate any recent Medical Services you may have received from other than [facility] providers in the past year (date may be approximate).     Assessment:   This is a routine wellness examination for San[place].  Hearing/Vision screen Hearing Screening - Comments:: Has hearing aids, but rarely wears them - moderate hearing difficulties  Vision Screening - Comments:: Wears reading glasses - up to date with annual eye exams with Dr Talbert Forest in [place] issues and exercise activities discussed: Current Exercise Habits: Home exercise routine, Type of exercise: walking, Time (Minutes): 30, Frequency (Times/Week): 4, Weekly Exercise (Minutes/Week): 120, Intensity: Mild, Exercise limited by: respiratory conditions(s)   Goals Addressed             This Visit's Progress    DIET - REDUCE SUGAR INTAKE   On track    Eat more fruits and vegetables   On track    Increase vegetables servings to 3-5 per day       Exercise 150 min/wk Moderate Activity   On track    Try to walk or engage in physical activity for at least 30 minutes 5 times a week         Depression Screen PHQ 2/9 Scores 05/28/2021 11/28/2020 08/14/2020 05/04/2020 01/16/2020 10/13/2019 07/12/2019  PHQ - 2 Score 0 0 0 0 0 0 0    Fall Risk Fall Risk  05/28/2021 11/28/2020 08/14/2020 05/04/2020 01/16/2020  Falls in the past year? 0 1 0 0 0  Comment - - - - -  Number  falls in past yr: 0 0 -  - -  Injury with Fall? 0 0 - - -  Risk for fall due to : No Fall Risks History of fall(s) - - -  Follow up Falls prevention discussed Education provided - - -    FALL RISK PREVENTION PERTAINING TO THE HOME:  Any stairs in or around the home? Yes  If so, are there any without handrails? No  Home free of loose throw rugs in walkways, pet beds, electrical cords, etc? Yes  Adequate lighting in your home to reduce risk of falls? Yes   ASSISTIVE DEVICES UTILIZED TO PREVENT FALLS:  Life alert? No  Use of a cane, walker or w/c? No  Grab bars in the bathroom? No  Shower chair or bench in shower? No  Elevated toilet seat or a handicapped toilet? No   TIMED UP AND GO:  Was the test performed? No . Telephonic visit  Cognitive Function: MMSE - Mini Mental State Exam 01/27/2018 01/26/2017  Orientation to time 5 5  Orientation to Place 5 5  Registration 3 3  Attention/ Calculation 5 5  Recall 2 2  Language- name 2 objects 2 2  Language- repeat 1 0  Language- follow 3 step command 3 3  Language- read & follow direction 1 1  Write a sentence 0 1  Copy design 1 1  Total score 28 28     6CIT Screen 05/28/2021 01/31/2019  What Year? 0 points 0 points  What month? 0 points 0 points  What time? 0 points 0 points  Count back from 20 0 points 0 points  Months in reverse 0 points 0 points  Repeat phrase 0 points 0 points  Total Score 0 0    Immunizations Immunization History  Administered Date(s) Administered   Fluad Quad(high Dose 65+) 05/23/2019, 05/04/2020   Influenza, High Dose Seasonal PF 05/19/2016, 05/26/2017, 05/25/2018   Influenza-Unspecified 05/11/2013, 05/20/2014, 05/26/2015   Moderna Sars-Covid-2 Vaccination 09/14/2019, 10/12/2019, 06/19/2020   Pneumococcal Conjugate-13 05/27/2013   Pneumococcal Polysaccharide-23 12/22/2014   Td 05/12/2003, 12/06/2018    TDAP status: Up to date  Flu Vaccine status: Due, Education has been provided regarding the importance of this  vaccine. Advised may receive this vaccine at local pharmacy or Health Dept. Aware to provide a copy of the vaccination record if obtained from local pharmacy or Health Dept. Verbalized acceptance and understanding.  Pneumococcal vaccine status: Up to date  Covid-19 vaccine status: Completed vaccines  Qualifies for Shingles Vaccine? Yes   Zostavax completed Yes   Shingrix Completed?: No.    Education has been provided regarding the importance of this vaccine. Patient has been advised to call insurance company to determine out of pocket expense if they have not yet received this vaccine. Advised may also receive vaccine at local pharmacy or Health Dept. Verbalized acceptance and understanding.  Screening Tests Health Maintenance  Topic Date Due   Zoster Vaccines- Shingrix (1 of 2) Never done   COVID-19 Vaccine (4 - Booster for Moderna series) 10/17/2020   INFLUENZA VACCINE  03/11/2021   HEMOGLOBIN A1C  05/30/2021   OPHTHALMOLOGY EXAM  06/05/2021   FOOT EXAM  08/14/2021   COLONOSCOPY (Pts 45-69yrs Insurance coverage will need to be confirmed)  01/19/2022   TETANUS/TDAP  12/05/2028   Hepatitis C Screening  Completed   HPV VACCINES  Aged Out    Health Maintenance  Health Maintenance Due  Topic Date Due   Zoster Vaccines- Shingrix (1 of 2) Never  done   COVID-19 Vaccine (4 - Booster for Moderna series) 10/17/2020   INFLUENZA VACCINE  03/11/2021    Colorectal cancer screening: Type of screening: Colonoscopy. Completed 01/20/2019. Repeat every 3 years  Lung Cancer Screening: (Low Dose CT Chest recommended if Age 56-80 years, 30 pack-year currently smoking OR have quit w/in 15years.) does not qualify.   Additional Screening:  Hepatitis C Screening: does qualify; Completed 02/08/2016  Vision Screening: Recommended annual ophthalmology exams for early detection of glaucoma and other disorders of the eye. Is the patient up to date with their annual eye exam?  Yes  Who is the provider or  what is the name of the office in which the patient attends annual eye exams? [facility] If pt is not established with a provider, would they like to be referred to a provider to establish care? No .   Dental Screening: Recommended annual dental exams for proper oral hygiene  Community Resource Referral / Chronic Care Management: CRR required this visit?  No   CCM required this visit?  No      Plan:     I have personally reviewed and noted the following in the patient's chart:   Medical and social history Use of alcohol, tobacco or illicit drugs  Current medications and supplements including opioid prescriptions. Patient is not currently taking opioid prescriptions. Functional ability and status Nutritional status Physical activity Advanced directives List of other physicians Hospitalizations, surgeries, and ER visits in previous 12 months Vitals Screenings to include cognitive, depression, and falls Referrals and appointments  In addition, I have reviewed and discussed with patient certain preventive protocols, quality metrics, and best practice recommendations. A written personalized care plan for preventive services as well as general preventive health recommendations were provided to patient.     Sandrea Hammond, LPN   29/19/1660   Nurse Notes: None

## 2021-05-30 DIAGNOSIS — L03031 Cellulitis of right toe: Secondary | ICD-10-CM | POA: Diagnosis not present

## 2021-05-30 DIAGNOSIS — M79674 Pain in right toe(s): Secondary | ICD-10-CM | POA: Diagnosis not present

## 2021-05-31 ENCOUNTER — Other Ambulatory Visit: Payer: Self-pay

## 2021-05-31 ENCOUNTER — Ambulatory Visit (INDEPENDENT_AMBULATORY_CARE_PROVIDER_SITE_OTHER): Payer: Medicare HMO | Admitting: Nurse Practitioner

## 2021-05-31 ENCOUNTER — Encounter: Payer: Self-pay | Admitting: Nurse Practitioner

## 2021-05-31 VITALS — BP 103/56 | HR 65 | Temp 97.6°F | Resp 20 | Ht 73.0 in | Wt 208.0 lb

## 2021-05-31 DIAGNOSIS — I10 Essential (primary) hypertension: Secondary | ICD-10-CM | POA: Diagnosis not present

## 2021-05-31 DIAGNOSIS — E119 Type 2 diabetes mellitus without complications: Secondary | ICD-10-CM

## 2021-05-31 DIAGNOSIS — Z6829 Body mass index (BMI) 29.0-29.9, adult: Secondary | ICD-10-CM | POA: Diagnosis not present

## 2021-05-31 DIAGNOSIS — Z23 Encounter for immunization: Secondary | ICD-10-CM

## 2021-05-31 DIAGNOSIS — E7800 Pure hypercholesterolemia, unspecified: Secondary | ICD-10-CM | POA: Diagnosis not present

## 2021-05-31 DIAGNOSIS — K219 Gastro-esophageal reflux disease without esophagitis: Secondary | ICD-10-CM | POA: Diagnosis not present

## 2021-05-31 LAB — CBC WITH DIFFERENTIAL/PLATELET
Basophils Absolute: 0.1 10*3/uL (ref 0.0–0.2)
Basos: 1 %
EOS (ABSOLUTE): 0.5 10*3/uL — ABNORMAL HIGH (ref 0.0–0.4)
Eos: 5 %
Hematocrit: 38.5 % (ref 37.5–51.0)
Hemoglobin: 13.2 g/dL (ref 13.0–17.7)
Immature Grans (Abs): 0 10*3/uL (ref 0.0–0.1)
Immature Granulocytes: 0 %
Lymphocytes Absolute: 1.9 10*3/uL (ref 0.7–3.1)
Lymphs: 20 %
MCH: 28.8 pg (ref 26.6–33.0)
MCHC: 34.3 g/dL (ref 31.5–35.7)
MCV: 84 fL (ref 79–97)
Monocytes Absolute: 0.7 10*3/uL (ref 0.1–0.9)
Monocytes: 8 %
Neutrophils Absolute: 6.2 10*3/uL (ref 1.4–7.0)
Neutrophils: 66 %
Platelets: 300 10*3/uL (ref 150–450)
RBC: 4.59 x10E6/uL (ref 4.14–5.80)
RDW: 13.1 % (ref 11.6–15.4)
WBC: 9.3 10*3/uL (ref 3.4–10.8)

## 2021-05-31 LAB — CMP14+EGFR
ALT: 13 IU/L (ref 0–44)
AST: 14 IU/L (ref 0–40)
Albumin/Globulin Ratio: 1.7 (ref 1.2–2.2)
Albumin: 4.2 g/dL (ref 3.7–4.7)
Alkaline Phosphatase: 70 IU/L (ref 44–121)
BUN/Creatinine Ratio: 12 (ref 10–24)
BUN: 12 mg/dL (ref 8–27)
Bilirubin Total: 0.9 mg/dL (ref 0.0–1.2)
CO2: 23 mmol/L (ref 20–29)
Calcium: 9.8 mg/dL (ref 8.6–10.2)
Chloride: 105 mmol/L (ref 96–106)
Creatinine, Ser: 1 mg/dL (ref 0.76–1.27)
Globulin, Total: 2.5 g/dL (ref 1.5–4.5)
Glucose: 125 mg/dL — ABNORMAL HIGH (ref 70–99)
Potassium: 4.2 mmol/L (ref 3.5–5.2)
Sodium: 142 mmol/L (ref 134–144)
Total Protein: 6.7 g/dL (ref 6.0–8.5)
eGFR: 79 mL/min/{1.73_m2} (ref >59)

## 2021-05-31 LAB — LIPID PANEL
Chol/HDL Ratio: 2.6 ratio (ref 0.0–5.0)
Cholesterol, Total: 82 mg/dL — ABNORMAL LOW (ref 100–199)
HDL: 31 mg/dL — ABNORMAL LOW (ref >39)
LDL Chol Calc (NIH): 35 mg/dL (ref 0–99)
Triglycerides: 78 mg/dL (ref 0–149)
VLDL Cholesterol Cal: 16 mg/dL (ref 5–40)

## 2021-05-31 LAB — BAYER DCA HB A1C WAIVED: HB A1C (BAYER DCA - WAIVED): 6.8 % — ABNORMAL HIGH (ref 4.8–5.6)

## 2021-05-31 MED ORDER — BENAZEPRIL HCL 40 MG PO TABS: 40.0000 mg | ORAL_TABLET | Freq: Every day | ORAL | 1 refills | Status: DC

## 2021-05-31 MED ORDER — HYDROCHLOROTHIAZIDE 25 MG PO TABS: 25.0000 mg | ORAL_TABLET | Freq: Every day | ORAL | 1 refills | Status: DC

## 2021-05-31 MED ORDER — ROSUVASTATIN CALCIUM 10 MG PO TABS: 10.0000 mg | ORAL_TABLET | Freq: Every day | ORAL | 1 refills | Status: DC

## 2021-05-31 MED ORDER — OMEPRAZOLE 40 MG PO CPDR: 40.0000 mg | DELAYED_RELEASE_CAPSULE | Freq: Every day | ORAL | 1 refills | Status: DC

## 2021-05-31 MED ORDER — AMLODIPINE BESYLATE 10 MG PO TABS: 10.0000 mg | ORAL_TABLET | Freq: Every day | ORAL | 1 refills | Status: DC

## 2021-05-31 MED ORDER — METFORMIN HCL 1000 MG PO TABS: 1000.0000 mg | ORAL_TABLET | Freq: Two times a day (BID) | ORAL | 1 refills | Status: DC

## 2021-05-31 NOTE — Patient Instructions (Signed)

## 2021-05-31 NOTE — Progress Notes (Signed)
Subjective:    Patient ID: Jorge Garcia, male    DOB: 11-21-1945, 75 y.o.   MRN: [id number]  Chief Complaint: Medical Management of Chronic Issues    HPI:  1. Primary hypertension No c/o chest pain, sob or headache. Does not check blood pressure at home. BP Readings from Last 3 Encounters:  05/31/21 (!) 103/56  11/28/20 125/77  08/14/20 114/70     2. Pure hypercholesterolemia Tries to watch diet.does no dedicated exercise. Lab Results  Component Value Date   CHOL 105 11/28/2020   HDL 42 11/28/2020   LDLCALC 46 11/28/2020   TRIG 88 11/28/2020   CHOLHDL 2.5 11/28/2020     3. Type 2 diabetes mellitus without complication, without long-term current use of insulin (HCC) Fasting blood sugars are running 120-130. He denies nay low blood sugars. Lab Results  Component Value Date   HGBA1C 6.5 11/28/2020     4. Gastroesophageal reflux disease without esophagitis Is on omeprazole daily and is doing well.  5. BMI 29.0-29.9,adult Weight is down 7lbs Wt Readings from Last 3 Encounters:  05/31/21 208 lb (94.3 kg)  05/28/21 215 lb (97.5 kg)  11/28/20 215 lb (97.5 kg)   BMI Readings from Last 3 Encounters:  05/31/21 27.44 kg/m  05/28/21 28.37 kg/m  11/28/20 28.37 kg/m       Outpatient Encounter Medications as of 05/31/2021  Medication Sig   Accu-Chek Softclix Lancets lancets USE 1  TO CHECK GLUCOSE 4 TIMES DAILY   amLODipine (NORVASC) 10 MG tablet Take 1 tablet (10 mg total) by mouth daily.   aspirin EC 81 MG tablet Take 81 mg by mouth daily.   benazepril (LOTENSIN) 40 MG tablet Take 1 tablet (40 mg total) by mouth daily.   cholecalciferol (VITAMIN D) 1000 UNITS tablet Take 2,000 Units by mouth daily.    fish oil-omega-3 fatty acids 1000 MG capsule Take 1 g by mouth daily.   glucose blood (ACCU-CHEK AVIVA PLUS) test strip Check glucose 4 times daily dx E11.9   hydrochlorothiazide (HYDRODIURIL) 25 MG tablet Take 1 tablet (25 mg total) by mouth daily.   ibuprofen  (ADVIL) 400 MG tablet Take 1 tablet (400 mg total) by mouth every 6 (six) hours as needed.   metFORMIN (GLUCOPHAGE) 1000 MG tablet TAKE 1 TABLET BY MOUTH TWICE DAILY WITH A MEAL   omeprazole (PRILOSEC) 40 MG capsule TAKE 1 CAPSULE BY MOUTH ONCE DAILY BEFORE BREAKFAST   rosuvastatin (CRESTOR) 10 MG tablet Take 1 tablet by mouth once daily   [DISCONTINUED] guaiFENesin (MUCINEX) 600 MG 12 hr tablet Take 1-2 tablets (600-1,200 mg total) by mouth 2 (two) times daily as needed for cough or to loosen phlegm.   [DISCONTINUED] benzonatate (TESSALON PERLES) 100 MG capsule Take 1 capsule (100 mg total) by mouth 3 (three) times daily as needed for cough.   No facility-administered encounter medications on file as of 05/31/2021.    Past Surgical History:  Procedure Laterality Date   COLONOSCOPY  12/01/2013   HEMORRHOID SURGERY  1978   POLYPECTOMY     UPPER GASTROINTESTINAL ENDOSCOPY      Family History  Problem Relation Age of Onset   Diabetes Mother    Hypertension Mother    Stroke Mother 77   Diabetes Father    Hypertension Father    Heart attack Father 42   Colon cancer Neg Hx    Colon polyps Neg Hx    Esophageal cancer Neg Hx    Stomach cancer Neg Hx  Rectal cancer Neg Hx     New complaints: None today  Social history: Lives by hisself but has a significant other.  Controlled substance contract: n/a      Review of Systems  Constitutional:  Negative for diaphoresis.  Eyes:  Negative for pain.  Respiratory:  Negative for shortness of breath.   Cardiovascular:  Negative for chest pain, palpitations and leg swelling.  Gastrointestinal:  Negative for abdominal pain.  Endocrine: Negative for polydipsia.  Skin:  Negative for rash.  Neurological:  Negative for dizziness, weakness and headaches.  Hematological:  Does not bruise/bleed easily.  All other systems reviewed and are negative.     Objective:   Physical Exam Vitals and nursing note reviewed.  Constitutional:       Appearance: Normal appearance. He is well-developed.  HENT:     Head: Normocephalic.     Nose: Nose normal.  Eyes:     Pupils: Pupils are equal, round, and reactive to light.  Neck:     Thyroid: No thyroid mass or thyromegaly.     Vascular: No carotid bruit or JVD.     Trachea: Phonation normal.  Cardiovascular:     Rate and Rhythm: Normal rate and regular rhythm.  Pulmonary:     Effort: Pulmonary effort is normal. No respiratory distress.     Breath sounds: Normal breath sounds.  Abdominal:     General: Bowel sounds are normal.     Palpations: Abdomen is soft.     Tenderness: There is no abdominal tenderness.  Musculoskeletal:        General: Normal range of motion.     Cervical back: Normal range of motion and neck supple.  Lymphadenopathy:     Cervical: No cervical adenopathy.  Skin:    General: Skin is warm and dry.  Neurological:     Mental Status: He is alert and oriented to person, place, and time.  Psychiatric:        Behavior: Behavior normal.        Thought Content: Thought content normal.        Judgment: Judgment normal.     BP (!) 103/56   Pulse 65   Temp 97.6 F (36.4 C) (Temporal)   Resp 20   Ht '6\' 1"'  (1.854 m)   Wt 208 lb (94.3 kg)   SpO2 96%   BMI 27.44 kg/m   Hgba1c 6.8%    Assessment & Plan:   Kees Idrovo comes in today with chief complaint of Medical Management of Chronic Issues   Diagnosis and orders addressed:  1. Primary hypertension Low sodium diet - CBC with Differential/Platelet - CMP14+EGFR - benazepril (LOTENSIN) 40 MG tablet; Take 1 tablet (40 mg total) by mouth daily.  Dispense: 90 tablet; Refill: 1 - amLODipine (NORVASC) 10 MG tablet; Take 1 tablet (10 mg total) by mouth daily.  Dispense: 90 tablet; Refill: 1 - hydrochlorothiazide (HYDRODIURIL) 25 MG tablet; Take 1 tablet (25 mg total) by mouth daily.  Dispense: 90 tablet; Refill: 1  2. Pure hypercholesterolemia Low fat diet - Lipid panel - rosuvastatin (CRESTOR) 10  MG tablet; Take 1 tablet (10 mg total) by mouth daily.  Dispense: 90 tablet; Refill: 1  3. Type 2 diabetes mellitus without complication, without long-term current use of insulin (HCC) Continue to watch carbs in diet - Bayer DCA Hb A1c Waived - metFORMIN (GLUCOPHAGE) 1000 MG tablet; Take 1 tablet (1,000 mg total) by mouth 2 (two) times daily with a meal.  Dispense: 180  tablet; Refill: 1  4. Gastroesophageal reflux disease without esophagitis Avoid spicy foods Do not eat 2 hours prior to bedtime - omeprazole (PRILOSEC) 40 MG capsule; Take 1 capsule (40 mg total) by mouth daily.  Dispense: 90 capsule; Refill: 1  5. BMI 29.0-29.9,adult Discussed diet and exercise for person with BMI >25 Will recheck weight in 3-6 months    Labs pending Health Maintenance reviewed Diet and exercise encouraged  Follow up plan: 6 months   Mary-Margaret Hassell Done, FNP

## 2021-06-06 DIAGNOSIS — H43813 Vitreous degeneration, bilateral: Secondary | ICD-10-CM | POA: Diagnosis not present

## 2021-06-06 DIAGNOSIS — Z961 Presence of intraocular lens: Secondary | ICD-10-CM | POA: Diagnosis not present

## 2021-06-06 DIAGNOSIS — H18413 Arcus senilis, bilateral: Secondary | ICD-10-CM | POA: Diagnosis not present

## 2021-06-06 DIAGNOSIS — E119 Type 2 diabetes mellitus without complications: Secondary | ICD-10-CM | POA: Diagnosis not present

## 2021-06-10 ENCOUNTER — Ambulatory Visit (INDEPENDENT_AMBULATORY_CARE_PROVIDER_SITE_OTHER): Payer: Medicare HMO | Admitting: Nurse Practitioner

## 2021-06-10 ENCOUNTER — Encounter: Payer: Self-pay | Admitting: Nurse Practitioner

## 2021-06-10 DIAGNOSIS — R058 Other specified cough: Secondary | ICD-10-CM | POA: Diagnosis not present

## 2021-06-10 DIAGNOSIS — R059 Cough, unspecified: Secondary | ICD-10-CM | POA: Insufficient documentation

## 2021-06-10 MED ORDER — BENZONATATE 100 MG PO CAPS: 100.0000 mg | ORAL_CAPSULE | Freq: Three times a day (TID) | ORAL | 0 refills | Status: DC | PRN

## 2021-06-10 NOTE — Progress Notes (Signed)
   Virtual Visit  Note Due to COVID-19 pandemic this visit was conducted virtually. This visit type was conducted due to national recommendations for restrictions regarding the COVID-19 Pandemic (e.g. social distancing, sheltering in place) in an effort to limit this patient's exposure and mitigate transmission in our community. All issues noted in this document were discussed and addressed.  A physical exam was not performed with this format.  I connected with Jorge Garcia on 06/10/21 at 1:15 PM by telephone and verified that I am speaking with the correct person using two identifiers. Jorge Garcia is currently located at home during visit. The provider, Ivy Lynn, NP is located in their office at time of visit.  I discussed the limitations, risks, security and privacy concerns of performing an evaluation and management service by telephone and the availability of in person appointments. I also discussed with the patient that there may be a patient responsible charge related to this service. The patient expressed understanding and agreed to proceed.   History and Present Illness:  Cough This is a recurrent problem. The current episode started 1 to 4 weeks ago. The problem has been unchanged. The problem occurs constantly. The cough is Non-productive. Pertinent negatives include no chills, ear congestion, ear pain, fever, headaches, heartburn, nasal congestion, sore throat or shortness of breath. Nothing aggravates the symptoms. He has tried OTC cough suppressant for the symptoms. The treatment provided no relief.     Review of Systems  Constitutional:  Negative for chills and fever.  HENT:  Negative for ear pain and sore throat.   Respiratory:  Positive for cough. Negative for shortness of breath.   Gastrointestinal:  Negative for heartburn.  Neurological:  Negative for headaches.  All other systems reviewed and are negative.   Observations/Objective: Televisit patient not in  distress  Assessment and Plan: Take meds as prescribed - Use a cool mist humidifier  -Use saline nose sprays frequently -Force fluids -For fever or aches or pains- take Tylenol or ibuprofen. -Benzonatate 100 mg tablet by mouth -If symptoms do not improve, he may need to be COVID tested to rule this out Follow up with worsening unresolved symptoms   Follow Up Instructions: Follow-up with worsening unresolved symptoms.    I discussed the assessment and treatment plan with the patient. The patient was provided an opportunity to ask questions and all were answered. The patient agreed with the plan and demonstrated an understanding of the instructions.   The patient was advised to call back or seek an in-person evaluation if the symptoms worsen or if the condition fails to improve as anticipated.  The above assessment and management plan was discussed with the patient. The patient verbalized understanding of and has agreed to the management plan. Patient is aware to call the clinic if symptoms persist or worsen. Patient is aware when to return to the clinic for a follow-up visit. Patient educated on when it is appropriate to go to the emergency department.   Time call ended: 1:30 PM  I provided 10 minutes of  non face-to-face time during this encounter.    Ivy Lynn, NP

## 2021-06-10 NOTE — Assessment & Plan Note (Signed)
Take meds as prescribed - Use a cool mist humidifier  -Use saline nose sprays frequently -Force fluids -For fever or aches or pains- take Tylenol or ibuprofen. -Benzonatate 100 mg tablet by mouth -If symptoms do not improve, he may need to be COVID tested to rule this out Follow up with worsening unresolved symptoms

## 2021-07-15 ENCOUNTER — Ambulatory Visit: Payer: Medicare HMO | Admitting: Gastroenterology

## 2021-08-06 ENCOUNTER — Telehealth: Payer: Self-pay | Admitting: Nurse Practitioner

## 2021-08-06 LAB — PULMONARY FUNCTION TEST

## 2021-08-06 NOTE — Telephone Encounter (Signed)
Patient sees [facility] yearly. He had a low dose CT scan in March of 22 which was negative. He had a chest xray at [facility] and something was seen so they ordered a CT scan which showed a mass. Now they want him to have a PET scan. He saw an chest specialist today. They are going to order PET scan. I will talk with patient once PET scan is done.

## 2021-08-22 DIAGNOSIS — B351 Tinea unguium: Secondary | ICD-10-CM | POA: Diagnosis not present

## 2021-08-22 DIAGNOSIS — M79676 Pain in unspecified toe(s): Secondary | ICD-10-CM | POA: Diagnosis not present

## 2021-08-22 DIAGNOSIS — L84 Corns and callosities: Secondary | ICD-10-CM | POA: Diagnosis not present

## 2021-08-22 DIAGNOSIS — E1142 Type 2 diabetes mellitus with diabetic polyneuropathy: Secondary | ICD-10-CM | POA: Diagnosis not present

## 2021-08-22 IMAGING — CT CT CHEST LUNG CANCER SCREENING LOW DOSE W/O CM
1 series · 10 of 10 positions shown, 13 images · non-contrast
Comparison: 10/28/2019

CLINICAL DATA: 74-year-old male with 50 pack-year history of
smoking. Lung cancer screening.

EXAM:
CT CHEST WITHOUT CONTRAST LOW-DOSE FOR LUNG CANCER SCREENING
TECHNIQUE: Multidetector CT imaging of the chest was performed following the
standard protocol without IV contrast.

[ct lung segmentation data · axial · 0.69mm/px · z∈[-198,-198]mm · 10 of 273 frames shown]
[frame 1/273  mediastinal]
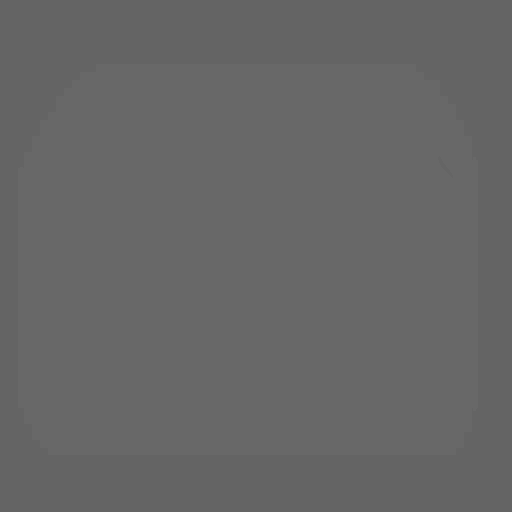
[frame 1/273  lung]
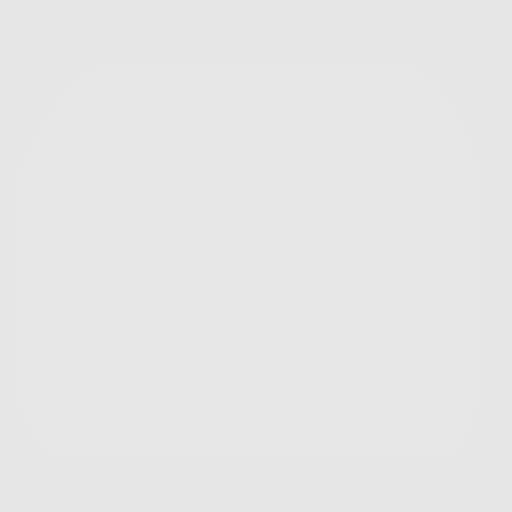
[frame 31/273  lung]
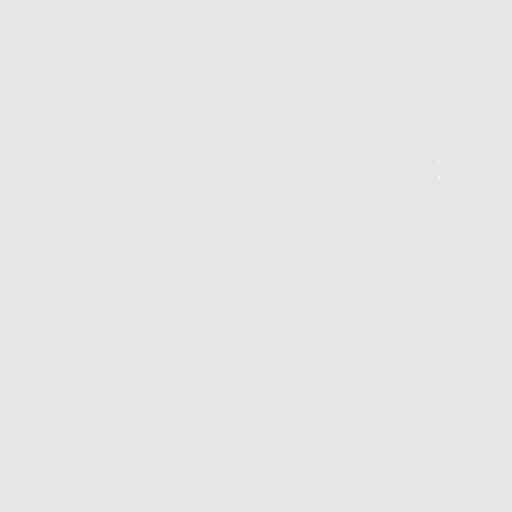
[frame 61/273  lung]
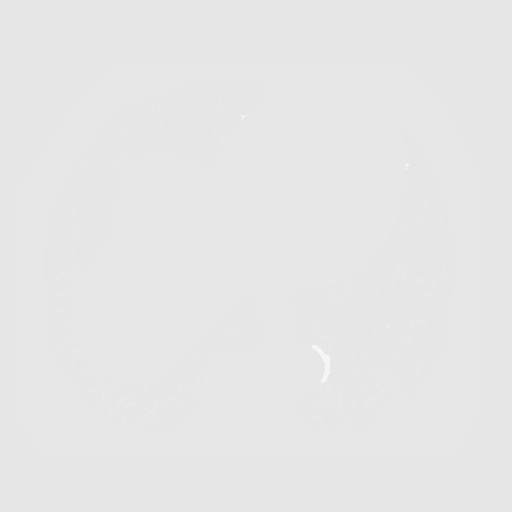
[frame 91/273  lung]
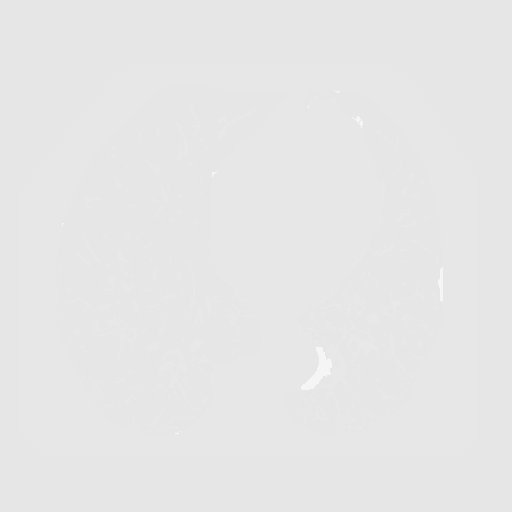
[frame 121/273  mediastinal]
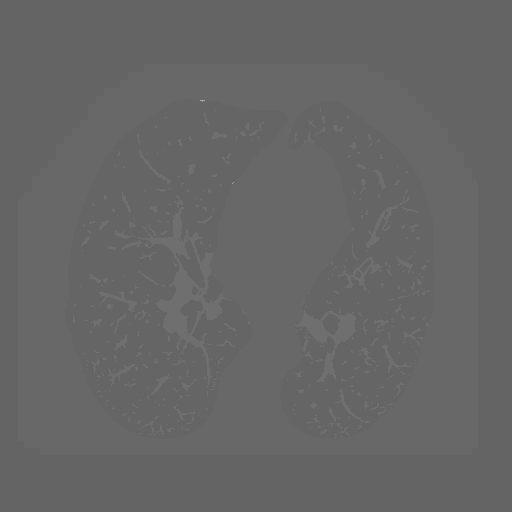
[frame 121/273  lung]
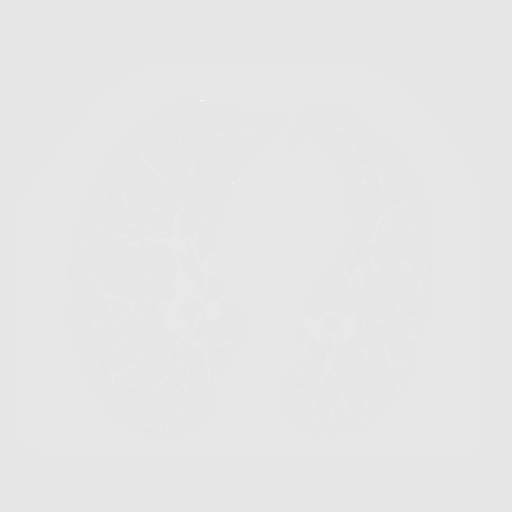
[frame 152/273  lung]
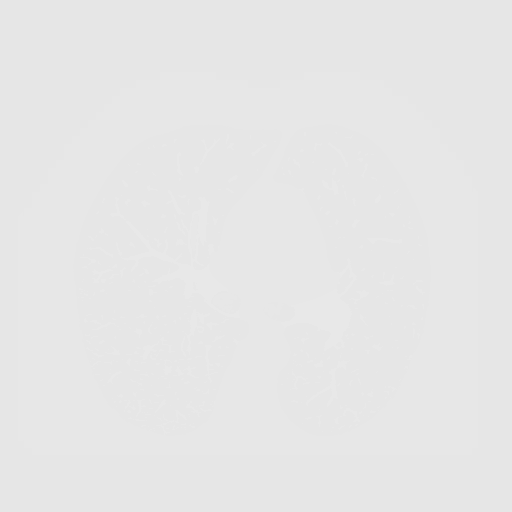
[frame 182/273  lung]
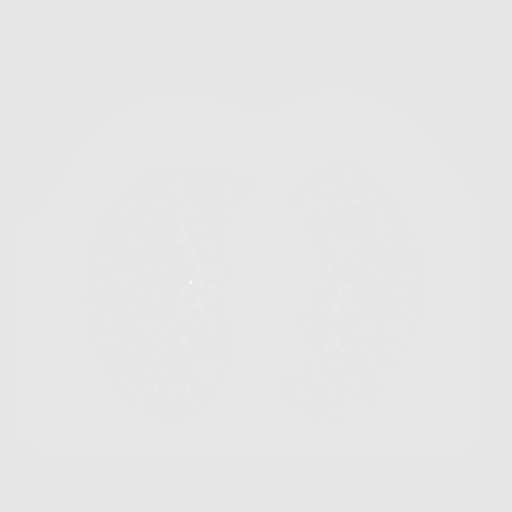
[frame 212/273  lung]
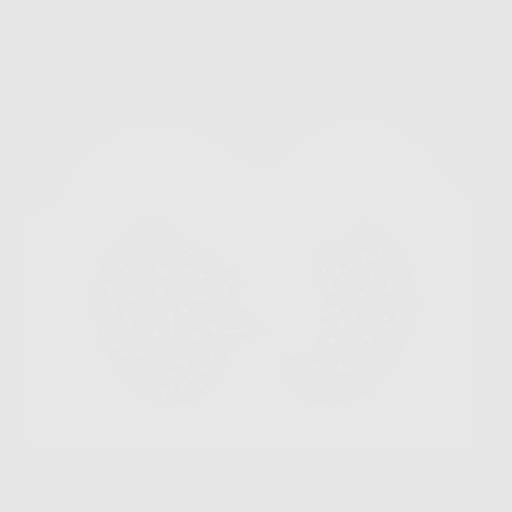
[frame 242/273  mediastinal]
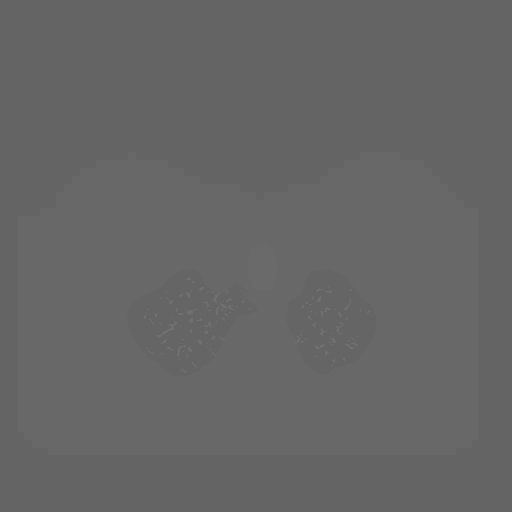
[frame 242/273  lung]
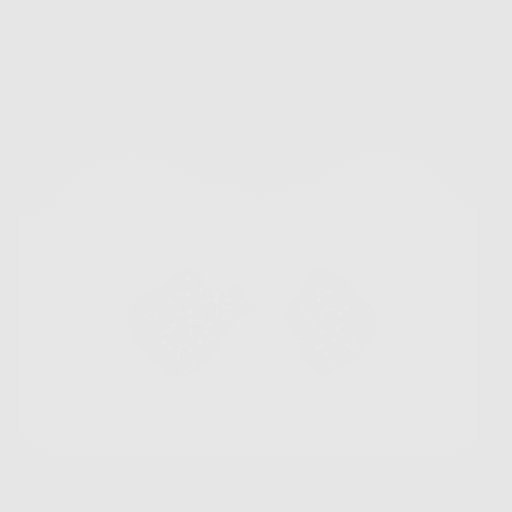
[frame 273/273  lung]
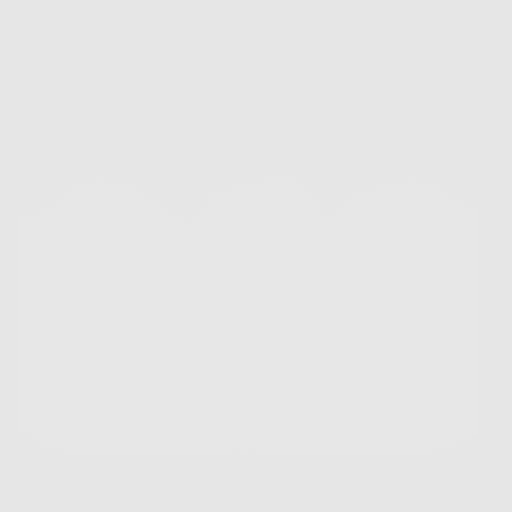

[10 of 10 positions shown; findings below may reference images not displayed]

FINDINGS: Cardiovascular: The heart size is normal. No substantial pericardial
effusion. Coronary artery calcification is evident. Atherosclerotic
calcification is noted in the wall of the thoracic aorta.

Mediastinum/Nodes: No mediastinal lymphadenopathy. No evidence for
gross hilar lymphadenopathy although assessment is limited by the
lack of intravenous contrast on today's study. The esophagus has
normal imaging features. There is no axillary lymphadenopathy.

Lungs/Pleura: Centrilobular emphsyema noted. Tiny left lower lobe
pulmonary nodules are unchanged. Stable tiny perifissural nodule in
the right lung. No new suspicious pulmonary nodule or mass. No focal
airspace consolidation. No pleural effusion.

Upper Abdomen: Unremarkable. Gallbladder not included on today's
study.

Musculoskeletal: No worrisome lytic or sclerotic osseous
abnormality.
IMPRESSION: 1. Lung-RADS 2, benign appearance or behavior. Continue annual
screening with low-dose chest CT without contrast in 12 months.
2.  Emphysema (FU8AE-GS7.C) and Aortic Atherosclerosis (FU8AE-170.0)

## 2021-08-30 ENCOUNTER — Other Ambulatory Visit: Payer: Self-pay | Admitting: Nurse Practitioner

## 2021-09-18 ENCOUNTER — Encounter (HOSPITAL_COMMUNITY): Payer: Self-pay

## 2021-09-18 NOTE — Progress Notes (Signed)
Call placed to patient regarding follow-up LDCT. Patient quit smoking in April 2007 and is now 15+ years out since smoking, no follow-up LDCT indicated at this time. Patient aware and verbalized understanding, referring provider also made aware. Referral closed.

## 2021-10-11 ENCOUNTER — Other Ambulatory Visit: Payer: Self-pay | Admitting: Nurse Practitioner

## 2021-10-11 DIAGNOSIS — E119 Type 2 diabetes mellitus without complications: Secondary | ICD-10-CM

## 2021-11-07 DIAGNOSIS — B351 Tinea unguium: Secondary | ICD-10-CM | POA: Diagnosis not present

## 2021-11-07 DIAGNOSIS — M79676 Pain in unspecified toe(s): Secondary | ICD-10-CM | POA: Diagnosis not present

## 2021-11-07 DIAGNOSIS — L84 Corns and callosities: Secondary | ICD-10-CM | POA: Diagnosis not present

## 2021-11-07 DIAGNOSIS — E1142 Type 2 diabetes mellitus with diabetic polyneuropathy: Secondary | ICD-10-CM | POA: Diagnosis not present

## 2021-12-05 ENCOUNTER — Ambulatory Visit (INDEPENDENT_AMBULATORY_CARE_PROVIDER_SITE_OTHER): Payer: Medicare HMO | Admitting: Nurse Practitioner

## 2021-12-05 ENCOUNTER — Encounter: Payer: Self-pay | Admitting: Nurse Practitioner

## 2021-12-05 VITALS — BP 107/67 | HR 65 | Temp 97.9°F | Resp 20 | Ht 73.0 in | Wt 214.0 lb

## 2021-12-05 DIAGNOSIS — N521 Erectile dysfunction due to diseases classified elsewhere: Secondary | ICD-10-CM

## 2021-12-05 DIAGNOSIS — E119 Type 2 diabetes mellitus without complications: Secondary | ICD-10-CM

## 2021-12-05 DIAGNOSIS — E1169 Type 2 diabetes mellitus with other specified complication: Secondary | ICD-10-CM

## 2021-12-05 DIAGNOSIS — K219 Gastro-esophageal reflux disease without esophagitis: Secondary | ICD-10-CM

## 2021-12-05 DIAGNOSIS — E7800 Pure hypercholesterolemia, unspecified: Secondary | ICD-10-CM

## 2021-12-05 DIAGNOSIS — I10 Essential (primary) hypertension: Secondary | ICD-10-CM | POA: Diagnosis not present

## 2021-12-05 DIAGNOSIS — Z6828 Body mass index (BMI) 28.0-28.9, adult: Secondary | ICD-10-CM

## 2021-12-05 LAB — BAYER DCA HB A1C WAIVED: HB A1C (BAYER DCA - WAIVED): 6.9 % — ABNORMAL HIGH (ref 4.8–5.6)

## 2021-12-05 MED ORDER — OMEPRAZOLE 40 MG PO CPDR: 40.0000 mg | DELAYED_RELEASE_CAPSULE | Freq: Every day | ORAL | 1 refills | Status: DC

## 2021-12-05 MED ORDER — AMLODIPINE BESYLATE 10 MG PO TABS: 10.0000 mg | ORAL_TABLET | Freq: Every day | ORAL | 1 refills | Status: DC

## 2021-12-05 MED ORDER — METFORMIN HCL 1000 MG PO TABS: 1000.0000 mg | ORAL_TABLET | Freq: Two times a day (BID) | ORAL | 1 refills | Status: DC

## 2021-12-05 MED ORDER — BENAZEPRIL HCL 40 MG PO TABS: 40.0000 mg | ORAL_TABLET | Freq: Every day | ORAL | 1 refills | Status: DC

## 2021-12-05 MED ORDER — ROSUVASTATIN CALCIUM 10 MG PO TABS: 10.0000 mg | ORAL_TABLET | Freq: Every day | ORAL | 1 refills | Status: DC

## 2021-12-05 MED ORDER — HYDROCHLOROTHIAZIDE 25 MG PO TABS: 25.0000 mg | ORAL_TABLET | Freq: Every day | ORAL | 1 refills | Status: DC

## 2021-12-05 NOTE — Progress Notes (Signed)
? ?Subjective:  ? ? Patient ID: Jorge Garcia, male    DOB: 1945/11/11, 76 y.o.   MRN: [id number] ? ? ?Chief Complaint: medical management of chronic issues  ?  ? ?HPI: ? ?Jorge Garcia is a 76 y.o. who identifies as a male who was assigned male at birth.  ? ?Social history: ?Lives with: by hisself ?Work history: retired ? ? ?Comes in today for follow up of the following chronic medical issues: ? ?1. Primary hypertension ?No c/o chest pain, sob or headache. Does not check blood pressure at home. ?BP Readings from Last 3 Encounters:  ?05/31/21 (!) 103/56  ?11/28/20 125/77  ?08/14/20 114/70  ? ? ? ?2. Pure hypercholesterolemia ?Does not really watch diet and does no dedicated exercise. ?Lab Results  ?Component Value Date  ? CHOL 82 (L) 05/31/2021  ? HDL 31 (L) 05/31/2021  ? [place] 35 05/31/2021  ? TRIG 78 05/31/2021  ? CHOLHDL 2.6 05/31/2021  ? ? ? ?3. Type 2 diabetes mellitus without complication, without long-term current use of insulin ([place]) ?Fasting blood sugars are running in the 120 most days. Has no low blood sugars. ?Lab Results  ?Component Value Date  ? HGBA1C 6.8 (H) 05/31/2021  ? ? ? ?4. Gastroesophageal reflux disease without esophagitis ?Is on omeprazole daily and is having no issues ? ?5. Erectile dysfunction associated with type 2 diabetes mellitus ([place]) ?Is really not a problem currently. ? ?6. BMI 27.0-27.9,adult ? ?Wt Readings from Last 3 Encounters:  ?12/05/21 214 lb (97.1 kg)  ?05/31/21 208 lb (94.3 kg)  ?05/28/21 215 lb (97.5 kg)  ? ?BMI Readings from Last 3 Encounters:  ?12/05/21 28.23 kg/m?  ?05/31/21 27.44 kg/m?  ?05/28/21 28.37 kg/m?  ? ? ? ? ?New complaints: ?Had lung nodule. He had an extensive work up including PET scan and that did not show anything. He had a repeat CT scan earlier this week and they toldit was good and he did not need follow up. ? ?No Known Allergies ?Outpatient Encounter Medications as of 12/05/2021  ?Medication Sig  ? Accu-Chek Softclix Lancets lancets USE 1  TO CHECK  GLUCOSE 4 TIMES DAILY  ? amLODipine (NORVASC) 10 MG tablet Take 1 tablet (10 mg total) by mouth daily.  ? aspirin EC 81 MG tablet Take 81 mg by mouth daily.  ? benazepril (LOTENSIN) 40 MG tablet Take 1 tablet (40 mg total) by mouth daily.  ? benzonatate (TESSALON PERLES) 100 MG capsule Take 1 capsule (100 mg total) by mouth 3 (three) times daily as needed for cough.  ? cholecalciferol (VITAMIN D) 1000 UNITS tablet Take 2,000 Units by mouth daily.   ? fish oil-omega-3 fatty acids 1000 MG capsule Take 1 g by mouth daily.  ? glucose blood (ACCU-CHEK AVIVA PLUS) test strip CHECK GLUCOSE 4 TIMES DAILY Dx E11.9  ? hydrochlorothiazide (HYDRODIURIL) 25 MG tablet Take 1 tablet (25 mg total) by mouth daily.  ? ibuprofen (ADVIL) 400 MG tablet Take 1 tablet (400 mg total) by mouth every 6 (six) hours as needed.  ? metFORMIN (GLUCOPHAGE) 1000 MG tablet Take 1 tablet (1,000 mg total) by mouth 2 (two) times daily with a meal. (NEEDS TO BE SEEN BEFORE NEXT REFILL)  ? omeprazole (PRILOSEC) 40 MG capsule Take 1 capsule (40 mg total) by mouth daily.  ? rosuvastatin (CRESTOR) 10 MG tablet Take 1 tablet (10 mg total) by mouth daily.  ? ?No facility-administered encounter medications on file as of 12/05/2021.  ? ? ?Past Surgical History:  ?Procedure Laterality  Date  ? COLONOSCOPY  12/01/2013  ? [place]  ? POLYPECTOMY    ? UPPER GASTROINTESTINAL ENDOSCOPY    ? ? ?Family History  ?Problem Relation Age of Onset  ? Diabetes Mother   ? Hypertension Mother   ? Stroke Mother 56  ? Diabetes Father   ? Hypertension Father   ? Heart attack Father 54  ? Colon cancer Neg Hx   ? Colon polyps Neg Hx   ? Esophageal cancer Neg Hx   ? Stomach cancer Neg Hx   ? Rectal cancer Neg Hx   ? ? ? ? ?Controlled substance contract: n/a ? ? ? ? ?Review of Systems  ?Constitutional:  Negative for diaphoresis.  ?Eyes:  Negative for pain.  ?Respiratory:  Negative for shortness of breath.   ?Cardiovascular:  Negative for chest pain, palpitations and leg  swelling.  ?Gastrointestinal:  Negative for abdominal pain.  ?Endocrine: Negative for polydipsia.  ?Skin:  Negative for rash.  ?Neurological:  Negative for dizziness, weakness and headaches.  ?Hematological:  Does not bruise/bleed easily.  ?All other systems reviewed and are negative. ? ?   ?Objective:  ? Physical Exam ?Vitals and nursing note reviewed.  ?Constitutional:   ?   Appearance: Normal appearance. He is well-developed.  ?HENT:  ?   Head: Normocephalic.  ?   Nose: Nose normal.  ?   Mouth/Throat:  ?   Mouth: Mucous membranes are moist.  ?   Pharynx: Oropharynx is clear.  ?Eyes:  ?   Pupils: Pupils are equal, round, and reactive to light.  ?Neck:  ?   Thyroid: No thyroid mass or thyromegaly.  ?   Vascular: No carotid bruit or JVD.  ?   Trachea: Phonation normal.  ?Cardiovascular:  ?   Rate and Rhythm: Normal rate and regular rhythm.  ?Pulmonary:  ?   Effort: Pulmonary effort is normal. No respiratory distress.  ?   Breath sounds: Normal breath sounds.  ?Abdominal:  ?   General: Bowel sounds are normal.  ?   Palpations: Abdomen is soft.  ?   Tenderness: There is no abdominal tenderness.  ?Musculoskeletal:     ?   General: Normal range of motion.  ?   Cervical back: Normal range of motion and neck supple.  ?Lymphadenopathy:  ?   Cervical: No cervical adenopathy.  ?Skin: ?   General: Skin is warm and dry.  ?Neurological:  ?   Mental Status: He is alert and oriented to person, place, and time.  ?Psychiatric:     ?   Behavior: Behavior normal.     ?   Thought Content: Thought content normal.     ?   Judgment: Judgment normal.  ? ? ?BP 107/67   Pulse 65   Temp 97.9 ?F (36.6 ?C) (Temporal)   Resp 20   Ht _0  (1.854 m)   Wt 214 lb (97.1 kg)   SpO2 98%   BMI 28.23 kg/m?  ? ?Hgba1c discussed at appointment 6.9% ? ? ?   ?Assessment & Plan:  ? ?Jorge Garcia comes in today with chief complaint of Medical Management of Chronic Issues ? ? ?Diagnosis and orders addressed: ? ?1. Primary hypertension ?Low sodium  diet ?- CBC with Differential/Platelet ?- CMP14+EGFR ?- benazepril (LOTENSIN) 40 MG tablet; Take 1 tablet (40 mg total) by mouth daily.  Dispense: 90 tablet; Refill: 1 ?- hydrochlorothiazide (HYDRODIURIL) 25 MG tablet; Take 1 tablet (25 mg total) by mouth daily.  Dispense: 90  tablet; Refill: 1 ?- amLODipine (NORVASC) 10 MG tablet; Take 1 tablet (10 mg total) by mouth daily.  Dispense: 90 tablet; Refill: 1 ? ?2. Pure hypercholesterolemia ?Ow fat diet ?- Lipid panel ?- rosuvastatin (CRESTOR) 10 MG tablet; Take 1 tablet (10 mg total) by mouth daily.  Dispense: 90 tablet; Refill: 1 ? ?3. Type 2 diabetes mellitus without complication, without long-term current use of insulin ([place]) ?Continue to watch carbsin diet ?- Bayer DCA Hb A1c Waived ?- Microalbumin / creatinine urine ratio ?- metFORMIN (GLUCOPHAGE) 1000 MG tablet; Take 1 tablet (1,000 mg total) by mouth 2 (two) times daily with a meal. (NEEDS TO BE SEEN BEFORE NEXT REFILL)  Dispense: 180 tablet; Refill: 1 ? ?4. Gastroesophageal reflux disease without esophagitis ?Avoid spicy foods ?Do not eat 2 hours prior to bedtime ?- omeprazole (PRILOSEC) 40 MG capsule; Take 1 capsule (40 mg total) by mouth daily.  Dispense: 90 capsule; Refill: 1 ? ?5. Erectile dysfunction associated with type 2 diabetes mellitus ([place]) ?Report any issues ? ?6. BMI 28.0-28.9,adult ?Discussed diet and exercise for person with BMI >25 ?Will recheck weight in 3-6 months ? ? ? ?Labs pending ?Health Maintenance reviewed ?Diet and exercise encouraged ? ?Follow up plan: ?6 months ? ? ?Mary-Margaret Hassell Done, FNP ? ?

## 2021-12-05 NOTE — Patient Instructions (Signed)

## 2021-12-06 LAB — CMP14+EGFR
ALT: 19 IU/L (ref 0–44)
AST: 17 IU/L (ref 0–40)
Albumin/Globulin Ratio: 2 (ref 1.2–2.2)
Albumin: 5.1 g/dL — ABNORMAL HIGH (ref 3.7–4.7)
Alkaline Phosphatase: 64 IU/L (ref 44–121)
BUN/Creatinine Ratio: 14 (ref 10–24)
BUN: 16 mg/dL (ref 8–27)
Bilirubin Total: 2.4 mg/dL — ABNORMAL HIGH (ref 0.0–1.2)
CO2: 25 mmol/L (ref 20–29)
Calcium: 10.7 mg/dL — ABNORMAL HIGH (ref 8.6–10.2)
Chloride: 103 mmol/L (ref 96–106)
Creatinine, Ser: 1.16 mg/dL (ref 0.76–1.27)
Globulin, Total: 2.5 g/dL (ref 1.5–4.5)
Glucose: 144 mg/dL — ABNORMAL HIGH (ref 70–99)
Potassium: 4.8 mmol/L (ref 3.5–5.2)
Sodium: 143 mmol/L (ref 134–144)
Total Protein: 7.6 g/dL (ref 6.0–8.5)
eGFR: 67 mL/min/1.73

## 2021-12-06 LAB — CBC WITH DIFFERENTIAL/PLATELET
Basophils Absolute: 0.1 10*3/uL (ref 0.0–0.2)
Basos: 1 %
EOS (ABSOLUTE): 0.6 10*3/uL — ABNORMAL HIGH (ref 0.0–0.4)
Eos: 5 %
Hematocrit: 47.3 % (ref 37.5–51.0)
Hemoglobin: 15.7 g/dL (ref 13.0–17.7)
Immature Grans (Abs): 0 10*3/uL (ref 0.0–0.1)
Immature Granulocytes: 0 %
Lymphocytes Absolute: 2.8 10*3/uL (ref 0.7–3.1)
Lymphs: 27 %
MCH: 28.4 pg (ref 26.6–33.0)
MCHC: 33.2 g/dL (ref 31.5–35.7)
MCV: 86 fL (ref 79–97)
Monocytes Absolute: 0.8 10*3/uL (ref 0.1–0.9)
Monocytes: 8 %
Neutrophils Absolute: 6.2 10*3/uL (ref 1.4–7.0)
Neutrophils: 59 %
Platelets: 294 10*3/uL (ref 150–450)
RBC: 5.53 x10E6/uL (ref 4.14–5.80)
RDW: 13.7 % (ref 11.6–15.4)
WBC: 10.5 10*3/uL (ref 3.4–10.8)

## 2021-12-06 LAB — LIPID PANEL
Chol/HDL Ratio: 2.6 ratio (ref 0.0–5.0)
Cholesterol, Total: 116 mg/dL (ref 100–199)
HDL: 44 mg/dL
LDL Chol Calc (NIH): 48 mg/dL (ref 0–99)
Triglycerides: 135 mg/dL (ref 0–149)
VLDL Cholesterol Cal: 24 mg/dL (ref 5–40)

## 2021-12-06 LAB — MICROALBUMIN / CREATININE URINE RATIO
Creatinine, Urine: 177.2 mg/dL
Microalb/Creat Ratio: 16 mg/g creat (ref 0–29)
Microalbumin, Urine: 28.6 ug/mL

## 2022-01-16 DIAGNOSIS — B351 Tinea unguium: Secondary | ICD-10-CM | POA: Diagnosis not present

## 2022-01-16 DIAGNOSIS — L84 Corns and callosities: Secondary | ICD-10-CM | POA: Diagnosis not present

## 2022-01-16 DIAGNOSIS — E1142 Type 2 diabetes mellitus with diabetic polyneuropathy: Secondary | ICD-10-CM | POA: Diagnosis not present

## 2022-01-16 DIAGNOSIS — M79676 Pain in unspecified toe(s): Secondary | ICD-10-CM | POA: Diagnosis not present

## 2022-01-28 ENCOUNTER — Other Ambulatory Visit: Payer: Self-pay | Admitting: Nurse Practitioner

## 2022-02-03 ENCOUNTER — Encounter: Payer: Self-pay | Admitting: Nurse Practitioner

## 2022-02-03 ENCOUNTER — Ambulatory Visit (INDEPENDENT_AMBULATORY_CARE_PROVIDER_SITE_OTHER): Payer: Medicare HMO | Admitting: Nurse Practitioner

## 2022-02-03 DIAGNOSIS — S61213A Laceration without foreign body of left middle finger without damage to nail, initial encounter: Secondary | ICD-10-CM | POA: Diagnosis not present

## 2022-02-03 DIAGNOSIS — S61211A Laceration without foreign body of left index finger without damage to nail, initial encounter: Secondary | ICD-10-CM | POA: Diagnosis not present

## 2022-02-03 NOTE — Progress Notes (Signed)
Subjective:    Patient ID: Jorge Garcia, male    DOB: 12-22-1945, 76 y.o.   MRN: [id number]   Chief Complaint: laceration  HPI  Patient was doing some bush trimming and lacerated his left index and ring finger .    Review of Systems  Constitutional:  Negative for diaphoresis.  Eyes:  Negative for pain.  Respiratory:  Negative for shortness of breath.   Cardiovascular:  Negative for chest pain, palpitations and leg swelling.  Gastrointestinal:  Negative for abdominal pain.  Endocrine: Negative for polydipsia.  Skin:  Negative for rash.  Neurological:  Negative for dizziness, weakness and headaches.  Hematological:  Does not bruise/bleed easily.  All other systems reviewed and are negative.      Objective:   Physical Exam Skin:    Comments: 3cm laceration to left middle finger V shaped 3cm laceratoin to left index finger Denies any numbness      Laceration repair  Date/Time: 02/03/2022 1:30 PM  Performed by: Bennie Pierini, FNP Authorized by: Bennie Pierini, FNP   Consent:    Consent obtained:  Verbal   Consent given by:  Patient   Risks, benefits, and alternatives were discussed: yes     Risks discussed:  Infection and pain   Alternatives discussed:  No treatment Anesthesia:    Anesthesia method:  Local infiltration Laceration details:    Location:  Finger   Finger location:  L long finger   Length (cm):  3 Pre-procedure details:    Preparation:  Patient was prepped and draped in usual sterile fashion Treatment:    Area cleansed with:  Povidone-iodine   Amount of cleaning:  Standard   Irrigation solution:  Sterile saline   Visualized foreign bodies/material removed: no     Debridement:  Minimal   Undermining:  None   Scar revision: no   Skin repair:    Repair method:  Sutures   Suture size:  4-0   Suture material:  Nylon   Suture technique:  Simple interrupted   Number of sutures:  5 Approximation:    Approximation:   Close Post-procedure details:    Dressing:  Bulky dressing   Procedure completion:  Tolerated well, no immediate complications Laceration repair  Date/Time: 02/03/2022 1:33 PM  Performed by: Bennie Pierini, FNP Authorized by: Bennie Pierini, FNP   Consent:    Consent obtained:  Verbal   Consent given by:  Patient   Risks discussed:  Infection and pain   Alternatives discussed:  No treatment Anesthesia:    Anesthesia method:  Local infiltration   Local anesthetic:  Lidocaine 1% w/o epi Laceration details:    Location:  Finger   Finger location:  L index finger   Length (cm):  3 Pre-procedure details:    Preparation:  Patient was prepped and draped in usual sterile fashion Treatment:    Area cleansed with:  Povidone-iodine   Amount of cleaning:  Standard   Irrigation solution:  Sterile saline   Visualized foreign bodies/material removed: no     Debridement:  Minimal   Undermining:  None   Scar revision: no   Skin repair:    Repair method:  Sutures   Suture size:  4-0   Suture material:  Nylon   Suture technique:  Simple interrupted   Number of sutures:  2 Approximation:    Approximation:  Close Repair type:    Repair type:  Simple Post-procedure details:    Dressing:  Bulky dressing   Procedure completion:  Tolerated well, no immediate complications         Assessment & Plan:   Jorge Garcia in today with chief complaint of No chief complaint on file.   1. Laceration of left index finger without foreign body without damage to nail, initial encounter  2. Laceration of left middle finger without foreign body without damage to nail, initial encounter Remove dressing tomorrow Clean with antibacterial soap Antibiotic ointment Keep covered 'watch for signs of infection Suture removal in 10 days    The above assessment and management plan was discussed with the patient. The patient verbalized understanding of and has agreed to the management  plan. Patient is aware to call the clinic if symptoms persist or worsen. Patient is aware when to return to the clinic for a follow-up visit. Patient educated on when it is appropriate to go to the emergency department.   Jorge Daphine Deutscher, FNP

## 2022-02-13 ENCOUNTER — Ambulatory Visit (INDEPENDENT_AMBULATORY_CARE_PROVIDER_SITE_OTHER): Payer: Medicare HMO | Admitting: Nurse Practitioner

## 2022-02-13 ENCOUNTER — Encounter: Payer: Self-pay | Admitting: Nurse Practitioner

## 2022-02-13 VITALS — BP 115/71 | HR 71 | Temp 98.9°F | Resp 20 | Ht 73.0 in | Wt 213.0 lb

## 2022-02-13 DIAGNOSIS — Z4802 Encounter for removal of sutures: Secondary | ICD-10-CM

## 2022-02-13 NOTE — Progress Notes (Signed)
   Subjective:    Patient ID: Jorge Garcia, male    DOB: 25-Dec-1945, 76 y.o.   MRN: [id number]   Chief Complaint: suture removal  HPI Patient was seen on 02/03/22 with laceration to his left index and ring finger from a hedge trimmer. Sutures were put in. He is here today for suture removal. He is doing well . Denies any pain or redness of fingers.    Review of Systems  Constitutional:  Negative for diaphoresis.  Eyes:  Negative for pain.  Respiratory:  Negative for shortness of breath.   Cardiovascular:  Negative for chest pain, palpitations and leg swelling.  Gastrointestinal:  Negative for abdominal pain.  Endocrine: Negative for polydipsia.  Skin:  Negative for rash.  Neurological:  Negative for dizziness, weakness and headaches.  Hematological:  Does not bruise/bleed easily.  All other systems reviewed and are negative.      Objective:   Physical Exam Vitals and nursing note reviewed.  Constitutional:      Appearance: Normal appearance. He is well-developed.  Neck:     Thyroid: No thyroid mass or thyromegaly.     Vascular: No carotid bruit or JVD.     Trachea: Phonation normal.  Cardiovascular:     Rate and Rhythm: Normal rate and regular rhythm.  Pulmonary:     Effort: Pulmonary effort is normal. No respiratory distress.     Breath sounds: Normal breath sounds.  Abdominal:     General: Bowel sounds are normal.     Palpations: Abdomen is soft.     Tenderness: There is no abdominal tenderness.  Musculoskeletal:        General: Normal range of motion.     Cervical back: Normal range of motion and neck supple.  Lymphadenopathy:     Cervical: No cervical adenopathy.  Skin:    General: Skin is warm and dry.     Comments: Wound edges well approximated. No erythemas or edema.  Neurological:     Mental Status: He is alert and oriented to person, place, and time.  Psychiatric:        Behavior: Behavior normal.        Thought Content: Thought content normal.         Judgment: Judgment normal.    BP 115/71   Pulse 71   Temp 98.9 F (37.2 C) (Temporal)   Resp 20   Ht '6\' 1"'$  (1.854 m)   Wt 213 lb (96.6 kg)   SpO2 94%   BMI 28.10 kg/m          Assessment & Plan:   Jorge Garcia in today with chief complaint of No chief complaint on file.   1. Visit for suture removal Keep clean and dry Steristrips will fall off themselves. RTO prn    The above assessment and management plan was discussed with the patient. The patient verbalized understanding of and has agreed to the management plan. Patient is aware to call the clinic if symptoms persist or worsen. Patient is aware when to return to the clinic for a follow-up visit. Patient educated on when it is appropriate to go to the emergency department.   Mary-Margaret Hassell Done, FNP

## 2022-02-19 ENCOUNTER — Encounter: Payer: Self-pay | Admitting: Gastroenterology

## 2022-02-26 ENCOUNTER — Encounter: Payer: Self-pay | Admitting: Gastroenterology

## 2022-03-19 DIAGNOSIS — R0602 Shortness of breath: Secondary | ICD-10-CM | POA: Diagnosis not present

## 2022-03-19 DIAGNOSIS — J441 Chronic obstructive pulmonary disease with (acute) exacerbation: Secondary | ICD-10-CM | POA: Diagnosis not present

## 2022-03-19 DIAGNOSIS — J329 Chronic sinusitis, unspecified: Secondary | ICD-10-CM | POA: Diagnosis not present

## 2022-03-19 DIAGNOSIS — R509 Fever, unspecified: Secondary | ICD-10-CM | POA: Diagnosis not present

## 2022-03-27 DIAGNOSIS — L84 Corns and callosities: Secondary | ICD-10-CM | POA: Diagnosis not present

## 2022-03-27 DIAGNOSIS — B351 Tinea unguium: Secondary | ICD-10-CM | POA: Diagnosis not present

## 2022-03-27 DIAGNOSIS — M79676 Pain in unspecified toe(s): Secondary | ICD-10-CM | POA: Diagnosis not present

## 2022-03-27 DIAGNOSIS — E1142 Type 2 diabetes mellitus with diabetic polyneuropathy: Secondary | ICD-10-CM | POA: Diagnosis not present

## 2022-04-01 ENCOUNTER — Ambulatory Visit (AMBULATORY_SURGERY_CENTER): Payer: Self-pay

## 2022-04-01 VITALS — Ht 73.0 in | Wt 206.0 lb

## 2022-04-01 DIAGNOSIS — Z86010 Personal history of colonic polyps: Secondary | ICD-10-CM

## 2022-04-01 MED ORDER — NA SULFATE-K SULFATE-MG SULF 17.5-3.13-1.6 GM/177ML PO SOLN: 1.0000 | Freq: Once | ORAL | 0 refills | Status: AC

## 2022-04-01 NOTE — Progress Notes (Signed)
No egg or soy allergy known to patient  No issues known to pt with past sedation with any surgeries or procedures Patient denies ever being told they had issues or difficulty with intubation  No FH of Malignant Hyperthermia Pt is not on diet pills Pt is not on home 02  Pt is not on blood thinners  Pt reports issues with constipation - patient reports he may go days without having a BM- advised patient to take OTC meds (Miralax daily and to also increase oral fluids and veggies/fruits when constipation occurs) No A fib or A flutter Have any cardiac testing pending--NO Pt instructed to use Singlecare[employer]com or GoodRx for a price reduction on prep

## 2022-04-07 ENCOUNTER — Encounter: Payer: Self-pay | Admitting: Gastroenterology

## 2022-04-22 ENCOUNTER — Encounter: Payer: Self-pay | Admitting: Gastroenterology

## 2022-04-22 ENCOUNTER — Ambulatory Visit (AMBULATORY_SURGERY_CENTER): Payer: Medicare HMO | Admitting: Gastroenterology

## 2022-04-22 VITALS — BP 104/79 | HR 60 | Temp 96.6°F | Resp 18 | Ht 73.0 in | Wt 206.0 lb

## 2022-04-22 DIAGNOSIS — Z86010 Personal history of colonic polyps: Secondary | ICD-10-CM | POA: Diagnosis not present

## 2022-04-22 DIAGNOSIS — D123 Benign neoplasm of transverse colon: Secondary | ICD-10-CM

## 2022-04-22 DIAGNOSIS — Z09 Encounter for follow-up examination after completed treatment for conditions other than malignant neoplasm: Secondary | ICD-10-CM | POA: Diagnosis not present

## 2022-04-22 HISTORY — PX: COLONOSCOPY: SHX174

## 2022-04-22 MED ORDER — SODIUM CHLORIDE 0.9 % IV SOLN: 500.0000 mL | INTRAVENOUS | Status: DC

## 2022-04-22 NOTE — Progress Notes (Signed)
To pacu, VSS. Report to [email or web address] 

## 2022-04-22 NOTE — Op Note (Signed)
[name] Patient Name: Jorge Garcia Procedure Date: 04/22/2022 10:12 AM MRN: [id number] Endoscopist: Ladene Artist , MD Age: 76 Referring MD:  Date of Birth: 25-Sep-1945 Gender: Male Account #: [id number] Procedure:                Colonoscopy Indications:              Surveillance: Personal history of adenomatous                            polyps on last colonoscopy 3 years ago Medicines:                Monitored Anesthesia Care Procedure:                Pre-Anesthesia Assessment:                           - Prior to the procedure, a History and Physical                            was performed, and patient medications and                            allergies were reviewed. The patient's tolerance of                            previous anesthesia was also reviewed. The risks                            and benefits of the procedure and the sedation                            options and risks were discussed with the patient.                            All questions were answered, and informed consent                            was obtained. Prior Anticoagulants: The patient has                            taken no previous anticoagulant or antiplatelet                            agents. ASA Grade Assessment: II - A patient with                            mild systemic disease. After reviewing the risks                            and benefits, the patient was deemed in                            satisfactory condition to undergo the procedure.  After obtaining informed consent, the colonoscope                            was passed under direct vision. Throughout the                            procedure, the patient's blood pressure, pulse, and                            oxygen saturations were monitored continuously. The                            CF [id number] #[id number] was introduced through the anus                            and advanced to the the  cecum, identified by                            appendiceal orifice and ileocecal valve. The                            ileocecal valve, appendiceal orifice, and rectum                            were photographed. The quality of the bowel                            preparation was good. The colonoscopy was performed                            without difficulty. The patient tolerated the                            procedure well. Scope In: 10:22:55 AM Scope Out: 10:38:21 AM Scope Withdrawal Time: 0 hours 11 minutes 7 seconds  Total Procedure Duration: 0 hours 15 minutes 26 seconds  Findings:                 The perianal and digital rectal examinations were                            normal.                           A 6 mm polyp was found in the transverse colon. The                            polyp was sessile. The polyp was removed with a                            cold snare. Resection and retrieval were complete.                           Multiple small-mouthed diverticula were found in  the left colon. There was no evidence of                            diverticular bleeding.                           Internal hemorrhoids were found during                            retroflexion. The hemorrhoids were small and Grade                            I (internal hemorrhoids that do not prolapse).                           The exam was otherwise without abnormality on                            direct and retroflexion views. Complications:            No immediate complications. Estimated blood loss:                            None. Estimated Blood Loss:     Estimated blood loss: none. Impression:               - One 6 mm polyp in the transverse colon, removed                            with a cold snare. Resected and retrieved.                           - Mild diverticulosis in the left colon.                           - Internal hemorrhoids.                            - The examination was otherwise normal on direct                            and retroflexion views. Recommendation:           - Patient has a contact number available for                            emergencies. The signs and symptoms of potential                            delayed complications were discussed with the                            patient. Return to normal activities tomorrow.                            Written discharge instructions were provided to the  patient.                           - High fiber diet.                           - Continue present medications.                           - Await pathology results.                           - No repeat colonoscopy due to age. Ladene Artist, MD 04/22/2022 10:42:32 AM This report has been signed electronically.

## 2022-04-22 NOTE — Progress Notes (Signed)
Pt's states no medical or surgical changes since previsit or office visit. 

## 2022-04-22 NOTE — Progress Notes (Signed)
History & Physical  Primary Care Physician:  Chevis Pretty, FNP Primary Gastroenterologist: Lucio Edward, MD  CHIEF COMPLAINT:  Personal history of colon polyps   HPI: Jorge Garcia is a 76 y.o. male with a personal history of adenomatous colon polyps for surveillance colonoscopy.   Past Medical History:  Diagnosis Date   Arthritis    back   Cataract    bilateral sx   Diabetes mellitus without complication ([name])    on meds   GERD (gastroesophageal reflux disease)    on meds   Hyperlipidemia    on meds   Hypertension    on meds   Internal hemorrhoids    Tubulovillous adenoma of colon 2009   Vitamin D deficiency     Past Surgical History:  Procedure Laterality Date   CATARACT EXTRACTION, BILATERAL Bilateral    COLONOSCOPY  12/01/2013   COLONOSCOPY  2020   MS-MAC-suprep(good)-tics/hems/HPPx1/TAx4   HEMORRHOID SURGERY  1978   POLYPECTOMY  2020   HPPx1/TAx4   UPPER GASTROINTESTINAL ENDOSCOPY     WISDOM TOOTH EXTRACTION      Prior to Admission medications   Medication Sig Start Date End Date Taking? Authorizing Provider  Accu-Chek Softclix Lancets lancets USE 1 LANCET TO CHECK GLUCOSE 4 TIMES DAILY 01/28/22  Yes Hassell Done, Mary-Margaret, FNP  amLODipine (NORVASC) 10 MG tablet Take 1 tablet (10 mg total) by mouth daily. Patient taking differently: Take 5 mg by mouth daily. 12/05/21  Yes Hassell Done, Mary-Margaret, FNP  aspirin EC 81 MG tablet Take 81 mg by mouth daily.   Yes [provider]  benazepril (LOTENSIN) 40 MG tablet Take 1 tablet (40 mg total) by mouth daily. Patient taking differently: Take 20 mg by mouth daily. 12/05/21  Yes Nebergall, Mary-Margaret, FNP  cholecalciferol (VITAMIN D) 1000 UNITS tablet Take 2,000 Units by mouth daily.    Yes [provider]  fish oil-omega-3 fatty acids 1000 MG capsule Take 1 g by mouth daily.   Yes [provider]  glucose blood (ACCU-CHEK AVIVA PLUS) test strip CHECK GLUCOSE 4 TIMES DAILY Dx E11.9  08/30/21  Yes Hassell Done, Mary-Margaret, FNP  hydrochlorothiazide (HYDRODIURIL) 25 MG tablet Take 1 tablet (25 mg total) by mouth daily. 12/05/21  Yes Buhrman, Mary-Margaret, FNP  omeprazole (PRILOSEC) 40 MG capsule Take 1 capsule (40 mg total) by mouth daily. 12/05/21  Yes Broadwell, Mary-Margaret, FNP  rosuvastatin (CRESTOR) 10 MG tablet Take 1 tablet (10 mg total) by mouth daily. 12/05/21  Yes Disla, Mary-Margaret, FNP  ibuprofen (ADVIL) 400 MG tablet Take 1 tablet (400 mg total) by mouth every 6 (six) hours as needed. 12/06/18   Noemi Chapel, MD  metFORMIN (GLUCOPHAGE) 1000 MG tablet Take 1 tablet (1,000 mg total) by mouth 2 (two) times daily with a meal. (NEEDS TO BE SEEN BEFORE NEXT REFILL) 12/05/21   Chevis Pretty, FNP    Current Outpatient Medications  Medication Sig Dispense Refill   Accu-Chek Softclix Lancets lancets USE 1 LANCET TO CHECK GLUCOSE 4 TIMES DAILY 400 each 10   amLODipine (NORVASC) 10 MG tablet Take 1 tablet (10 mg total) by mouth daily. (Patient taking differently: Take 5 mg by mouth daily.) 90 tablet 1   aspirin EC 81 MG tablet Take 81 mg by mouth daily.     benazepril (LOTENSIN) 40 MG tablet Take 1 tablet (40 mg total) by mouth daily. (Patient taking differently: Take 20 mg by mouth daily.) 90 tablet 1   cholecalciferol (VITAMIN D) 1000 UNITS tablet Take 2,000 Units by mouth daily.  fish oil-omega-3 fatty acids 1000 MG capsule Take 1 g by mouth daily.     glucose blood (ACCU-CHEK AVIVA PLUS) test strip CHECK GLUCOSE 4 TIMES DAILY Dx E11.9 400 each 3   hydrochlorothiazide (HYDRODIURIL) 25 MG tablet Take 1 tablet (25 mg total) by mouth daily. 90 tablet 1   omeprazole (PRILOSEC) 40 MG capsule Take 1 capsule (40 mg total) by mouth daily. 90 capsule 1   rosuvastatin (CRESTOR) 10 MG tablet Take 1 tablet (10 mg total) by mouth daily. 90 tablet 1   ibuprofen (ADVIL) 400 MG tablet Take 1 tablet (400 mg total) by mouth every 6 (six) hours as needed. 30 tablet 0   metFORMIN  (GLUCOPHAGE) 1000 MG tablet Take 1 tablet (1,000 mg total) by mouth 2 (two) times daily with a meal. (NEEDS TO BE SEEN BEFORE NEXT REFILL) 180 tablet 1   Current Facility-Administered Medications  Medication Dose Route Frequency Provider Last Rate Last Admin   0.9 %  sodium chloride infusion  500 mL Intravenous Continuous Ladene Artist, MD        Allergies as of 04/22/2022   (No Known Allergies)    Family History  Problem Relation Age of Onset   Diabetes Mother    Hypertension Mother    Stroke Mother 11   Diabetes Father    Hypertension Father    Heart attack Father 31   Colon cancer Neg Hx    Colon polyps Neg Hx    Esophageal cancer Neg Hx    Stomach cancer Neg Hx    Rectal cancer Neg Hx     Social History   Socioeconomic History   Marital status: Single    Spouse name: Not on file   Number of children: 0   Years of education: 12   Highest education level: 12th grade  Occupational History   Occupation: Retired    Fish farm manager: [employer]  Tobacco Use   Smoking status: Former    Packs/day: 2.00    Years: 40.00    Total pack years: 80.00    Types: Cigarettes    Quit date: 11/09/2005    Years since quitting: 16.4   Smokeless tobacco: Never  Vaping Use   Vaping Use: Never used  Substance and Sexual Activity   Alcohol use: No   Drug use: No   Sexual activity: Not Currently  Other Topics Concern   Not on file  Social History Narrative   Lives alone. No children    VA benefits   Social Determinants of Health   Financial Resource Strain: Low Risk  (05/28/2021)   Overall Financial Resource Strain (CARDIA)    Difficulty of Paying Living Expenses: Not hard at all  Food Insecurity: No Food Insecurity (05/28/2021)   Hunger Vital Sign    Worried About Running Out of Food in the Last Year: Never true    Ran Out of Food in the Last Year: Never true  Transportation Needs: No Transportation Needs (05/28/2021)   PRAPARE - Hydrologist  (Medical): No    Lack of Transportation (Non-Medical): No  Physical Activity: Insufficiently Active (05/28/2021)   Exercise Vital Sign    Days of Exercise per Week: 4 days    Minutes of Exercise per Session: 30 min  Stress: No Stress Concern Present (05/28/2021)   [place]    Feeling of Stress : Not at all  Social Connections: Moderately Integrated (05/28/2021)   Social  Connection and Isolation Panel [NHANES]    Frequency of Communication with Friends and Family: More than three times a week    Frequency of Social Gatherings with Friends and Family: More than three times a week    Attends Religious Services: More than 4 times per year    Active Member of [employer] or Organizations: Yes    Attends Music therapist: More than 4 times per year    Marital Status: Divorced  Intimate Partner Violence: Not At Risk (05/28/2021)   Humiliation, Afraid, Rape, and Kick questionnaire    Fear of Current or Ex-Partner: No    Emotionally Abused: No    Physically Abused: No    Sexually Abused: No    Review of Systems:  All systems reviewed were negative except where noted in HPI.   Physical Exam: General:  Alert, well-developed, in NAD Head:  Normocephalic and atraumatic. Eyes:  Sclera clear, no icterus.   Conjunctiva pink. Ears:  Normal auditory acuity. Mouth:  No deformity or lesions.  Neck:  Supple; no masses . Lungs:  Clear throughout to auscultation.   No wheezes, crackles, or rhonchi. No acute distress. Heart:  Regular rate and rhythm; no murmurs. Abdomen:  Soft, nondistended, nontender. No masses, hepatomegaly. No obvious masses.  Normal bowel .    Rectal:  Deferred   Msk:  Symmetrical without gross deformities.. Pulses:  Normal pulses noted. Extremities:  Without edema. Neurologic:  Alert and  oriented x4;  grossly normal neurologically. Skin:  Intact without significant lesions or rashes. Cervical  Nodes:  No significant cervical adenopathy. Psych:  Alert and cooperative. Normal mood and affect.    Impression / Plan:   Personal history of adenomatous colon polyps for surveillance colonoscopy.  Pricilla Riffle. Fuller Plan  04/22/2022, 10:14 AM See Shea Evans, [facility], to contact our on call provider

## 2022-04-22 NOTE — Patient Instructions (Signed)
Information on polyps, diverticulosis and hemorrhoids given to you today.  Await pathology results.  Resume previous diet and medications.  No repeat colonoscopy due to age.    YOU HAD AN ENDOSCOPIC PROCEDURE TODAY AT [facility]:   Refer to the procedure report that was given to you for any specific questions about what was found during the examination.  If the procedure report does not answer your questions, please call your gastroenterologist to clarify.  If you requested that your care partner not be given the details of your procedure findings, then the procedure report has been included in a sealed envelope for you to review at your convenience later.  YOU SHOULD EXPECT: Some feelings of bloating in the abdomen. Passage of more gas than usual.  Walking can help get rid of the air that was put into your GI tract during the procedure and reduce the bloating. If you had a lower endoscopy (such as a colonoscopy or flexible sigmoidoscopy) you may notice spotting of blood in your stool or on the toilet paper. If you underwent a bowel prep for your procedure, you may not have a normal bowel movement for a few days.  Please Note:  You might notice some irritation and congestion in your nose or some drainage.  This is from the oxygen used during your procedure.  There is no need for concern and it should clear up in a day or so.  SYMPTOMS TO REPORT IMMEDIATELY:  Following lower endoscopy (colonoscopy or flexible sigmoidoscopy):  Excessive amounts of blood in the stool  Significant tenderness or worsening of abdominal pains  Swelling of the abdomen that is new, acute  Fever of 100F or higher   For urgent or emergent issues, a gastroenterologist can be reached at any hour by calling [phone]. Do not use MyChart messaging for urgent concerns.    DIET:  We do recommend a small meal at first, but then you may proceed to your regular diet.  Drink plenty of fluids but you  should avoid alcoholic beverages for 24 hours.  ACTIVITY:  You should plan to take it easy for the rest of today and you should NOT DRIVE or use heavy machinery until tomorrow (because of the sedation medicines used during the test).    FOLLOW UP: Our staff will call the number listed on your records the next business day following your procedure.  We will call around 7:15- 8:00 am to check on you and address any questions or concerns that you may have regarding the information given to you following your procedure. If we do not reach you, we will leave a message.     If any biopsies were taken you will be contacted by phone or by letter within the next 1-3 weeks.  Please call us at [phone] if you have not heard about the biopsies in 3 weeks.    SIGNATURES/CONFIDENTIALITY: You and/or your care partner have signed paperwork which will be entered into your electronic medical record.  These signatures attest to the fact that that the information above on your After Visit Summary has been reviewed and is understood.  Full responsibility of the confidentiality of this discharge information lies with you and/or your care-partner.

## 2022-04-22 NOTE — Progress Notes (Signed)
Called to room to assist during endoscopic procedure.  Patient ID and intended procedure confirmed with present staff. Received instructions for my participation in the procedure from the performing physician.  

## 2022-04-23 ENCOUNTER — Telehealth: Payer: Self-pay | Admitting: *Deleted

## 2022-04-23 NOTE — Telephone Encounter (Signed)
  Follow up Call-     04/22/2022    9:36 AM  Call back number  Post procedure Call Back phone  # [phone]  Permission to leave phone message Yes     Patient questions:  Do you have a fever, pain , or abdominal swelling? No. Pain Score  0 *  Have you tolerated food without any problems? Yes.    Have you been able to return to your normal activities? Yes.    Do you have any questions about your discharge instructions: Diet   No. Medications  No. Follow up visit  No.  Do you have questions or concerns about your Care? No.  Actions: * If pain score is 4 or above: No action needed, pain <4.

## 2022-04-30 ENCOUNTER — Encounter: Payer: Self-pay | Admitting: Gastroenterology

## 2022-06-05 DIAGNOSIS — L84 Corns and callosities: Secondary | ICD-10-CM | POA: Diagnosis not present

## 2022-06-05 DIAGNOSIS — E1142 Type 2 diabetes mellitus with diabetic polyneuropathy: Secondary | ICD-10-CM | POA: Diagnosis not present

## 2022-06-05 DIAGNOSIS — M79676 Pain in unspecified toe(s): Secondary | ICD-10-CM | POA: Diagnosis not present

## 2022-06-05 DIAGNOSIS — B351 Tinea unguium: Secondary | ICD-10-CM | POA: Diagnosis not present

## 2022-06-06 ENCOUNTER — Encounter: Payer: Self-pay | Admitting: Nurse Practitioner

## 2022-06-06 ENCOUNTER — Ambulatory Visit (INDEPENDENT_AMBULATORY_CARE_PROVIDER_SITE_OTHER): Payer: Medicare HMO | Admitting: Nurse Practitioner

## 2022-06-06 VITALS — BP 120/68 | HR 63 | Temp 97.4°F | Resp 20 | Ht 73.0 in | Wt 207.0 lb

## 2022-06-06 DIAGNOSIS — Z125 Encounter for screening for malignant neoplasm of prostate: Secondary | ICD-10-CM | POA: Diagnosis not present

## 2022-06-06 DIAGNOSIS — E1169 Type 2 diabetes mellitus with other specified complication: Secondary | ICD-10-CM

## 2022-06-06 DIAGNOSIS — K219 Gastro-esophageal reflux disease without esophagitis: Secondary | ICD-10-CM

## 2022-06-06 DIAGNOSIS — I10 Essential (primary) hypertension: Secondary | ICD-10-CM

## 2022-06-06 DIAGNOSIS — N521 Erectile dysfunction due to diseases classified elsewhere: Secondary | ICD-10-CM | POA: Diagnosis not present

## 2022-06-06 DIAGNOSIS — Z6829 Body mass index (BMI) 29.0-29.9, adult: Secondary | ICD-10-CM

## 2022-06-06 DIAGNOSIS — E119 Type 2 diabetes mellitus without complications: Secondary | ICD-10-CM

## 2022-06-06 DIAGNOSIS — E7800 Pure hypercholesterolemia, unspecified: Secondary | ICD-10-CM

## 2022-06-06 LAB — BAYER DCA HB A1C WAIVED: HB A1C (BAYER DCA - WAIVED): 6.7 % — ABNORMAL HIGH (ref 4.8–5.6)

## 2022-06-06 MED ORDER — HYDROCHLOROTHIAZIDE 25 MG PO TABS: 25.0000 mg | ORAL_TABLET | Freq: Every day | ORAL | 1 refills | Status: DC

## 2022-06-06 MED ORDER — OMEPRAZOLE 40 MG PO CPDR: 40.0000 mg | DELAYED_RELEASE_CAPSULE | Freq: Every day | ORAL | 1 refills | Status: DC

## 2022-06-06 MED ORDER — ROSUVASTATIN CALCIUM 10 MG PO TABS: 10.0000 mg | ORAL_TABLET | Freq: Every day | ORAL | 1 refills | Status: DC

## 2022-06-06 MED ORDER — BENAZEPRIL HCL 40 MG PO TABS: 40.0000 mg | ORAL_TABLET | Freq: Every day | ORAL | 1 refills | Status: DC

## 2022-06-06 MED ORDER — AMLODIPINE BESYLATE 10 MG PO TABS: 10.0000 mg | ORAL_TABLET | Freq: Every day | ORAL | 1 refills | Status: DC

## 2022-06-06 MED ORDER — METFORMIN HCL 1000 MG PO TABS: 1000.0000 mg | ORAL_TABLET | Freq: Two times a day (BID) | ORAL | 1 refills | Status: DC

## 2022-06-06 NOTE — Progress Notes (Signed)
Subjective:    Patient ID: Jorge Garcia, male    DOB: 1945/09/24, 76 y.o.   MRN: [id number]   Chief Complaint: medical management of chronic issues     HPI:  Jorge Garcia is a 76 y.o. who identifies as a male who was assigned male at birth.   Social history: Lives with: by himself Work history: retired   Scientist, forensic in today for follow up of the following chronic medical issues:  1. Primary hypertension No c/o chest pain, sob or headache. Does not check blood pressure at home. BP Readings from Last 3 Encounters:  04/22/22 104/79  02/13/22 115/71  12/05/21 107/67     2. Pure hypercholesterolemia Does not really watch diet. Stays active but no dedicated exercise. Lab Results  Component Value Date   CHOL 116 12/05/2021   HDL 44 12/05/2021   LDLCALC 48 12/05/2021   TRIG 135 12/05/2021   CHOLHDL 2.6 12/05/2021   The ASCVD Risk score (Arnett DK, et al., 2019) failed to calculate for the following reasons:   The valid total cholesterol range is 130 to 320 mg/dL   3. Type 2 diabetes mellitus without complication, without long-term current use of insulin (HCC) Fasting blood sugars are running around 120-130. No symptoms of low blod sugars Lab Results  Component Value Date   HGBA1C 6.9 (H) 12/05/2021    4. Gastroesophageal reflux disease without esophagitis Is on omeprazole daily and is doing well.  5. Erectile dysfunction associated with type 2 diabetes mellitus ([place]) He says he is not on anything. Says he is okay  6. BMI 29.0-29.9,adult No recent weight changes Wt Readings from Last 3 Encounters:  06/06/22 207 lb (93.9 kg)  04/22/22 206 lb (93.4 kg)  04/01/22 206 lb (93.4 kg)   BMI Readings from Last 3 Encounters:  06/06/22 27.31 kg/m  04/22/22 27.18 kg/m  04/01/22 27.18 kg/m     New complaints: N/a  No Known Allergies Outpatient Encounter Medications as of 06/06/2022  Medication Sig   Accu-Chek Softclix Lancets lancets USE 1 LANCET TO CHECK GLUCOSE  4 TIMES DAILY   amLODipine (NORVASC) 10 MG tablet Take 1 tablet (10 mg total) by mouth daily. (Patient taking differently: Take 5 mg by mouth daily.)   aspirin EC 81 MG tablet Take 81 mg by mouth daily.   benazepril (LOTENSIN) 40 MG tablet Take 1 tablet (40 mg total) by mouth daily. (Patient taking differently: Take 20 mg by mouth daily.)   cholecalciferol (VITAMIN D) 1000 UNITS tablet Take 2,000 Units by mouth daily.    fish oil-omega-3 fatty acids 1000 MG capsule Take 1 g by mouth daily.   glucose blood (ACCU-CHEK AVIVA PLUS) test strip CHECK GLUCOSE 4 TIMES DAILY Dx E11.9   hydrochlorothiazide (HYDRODIURIL) 25 MG tablet Take 1 tablet (25 mg total) by mouth daily.   ibuprofen (ADVIL) 400 MG tablet Take 1 tablet (400 mg total) by mouth every 6 (six) hours as needed.   metFORMIN (GLUCOPHAGE) 1000 MG tablet Take 1 tablet (1,000 mg total) by mouth 2 (two) times daily with a meal. (NEEDS TO BE SEEN BEFORE NEXT REFILL)   omeprazole (PRILOSEC) 40 MG capsule Take 1 capsule (40 mg total) by mouth daily.   rosuvastatin (CRESTOR) 10 MG tablet Take 1 tablet (10 mg total) by mouth daily.   No facility-administered encounter medications on file as of 06/06/2022.    Past Surgical History:  Procedure Laterality Date   CATARACT EXTRACTION, BILATERAL Bilateral    COLONOSCOPY  12/01/2013  COLONOSCOPY  2020   MS-MAC-suprep(good)-tics/hems/HPPx1/TAx4   COLONOSCOPY  04/22/2022   HEMORRHOID SURGERY  1978   POLYPECTOMY  2020   HPPx1/TAx4   UPPER GASTROINTESTINAL ENDOSCOPY     WISDOM TOOTH EXTRACTION      Family History  Problem Relation Age of Onset   Diabetes Mother    Hypertension Mother    Stroke Mother 4   Diabetes Father    Hypertension Father    Heart attack Father 80   Colon cancer Neg Hx    Colon polyps Neg Hx    Esophageal cancer Neg Hx    Stomach cancer Neg Hx    Rectal cancer Neg Hx         Review of Systems  Constitutional:  Negative for diaphoresis.  Eyes:  Negative for  pain.  Respiratory:  Negative for shortness of breath.   Cardiovascular:  Negative for chest pain, palpitations and leg swelling.  Gastrointestinal:  Negative for abdominal pain.  Endocrine: Negative for polydipsia.  Skin:  Negative for rash.  Neurological:  Negative for dizziness, weakness and headaches.  Hematological:  Does not bruise/bleed easily.  All other systems reviewed and are negative.      Objective:   Physical Exam Vitals and nursing note reviewed.  Constitutional:      Appearance: Normal appearance. He is well-developed.  HENT:     Head: Normocephalic.     Nose: Nose normal.     Mouth/Throat:     Mouth: Mucous membranes are moist.     Pharynx: Oropharynx is clear.  Eyes:     Pupils: Pupils are equal, round, and reactive to light.  Neck:     Thyroid: No thyroid mass or thyromegaly.     Vascular: No carotid bruit or JVD.     Trachea: Phonation normal.  Cardiovascular:     Rate and Rhythm: Normal rate and regular rhythm.  Pulmonary:     Effort: Pulmonary effort is normal. No respiratory distress.     Breath sounds: Normal breath sounds.  Abdominal:     General: Bowel sounds are normal.     Palpations: Abdomen is soft.     Tenderness: There is no abdominal tenderness.  Musculoskeletal:        General: Normal range of motion.     Cervical back: Normal range of motion and neck supple.  Lymphadenopathy:     Cervical: No cervical adenopathy.  Skin:    General: Skin is warm and dry.  Neurological:     Mental Status: He is alert and oriented to person, place, and time.  Psychiatric:        Behavior: Behavior normal.        Thought Content: Thought content normal.        Judgment: Judgment normal.    BP 120/68   Pulse 63   Temp (!) 97.4 F (36.3 C) (Temporal)   Resp 20   Ht 6' 1" (1.854 m)   Wt 207 lb (93.9 kg)   SpO2 97%   BMI 27.31 kg/m   HGBA1c 6.7%      Assessment & Plan:   Jorge Garcia comes in today with chief complaint of Medical  Management of Chronic Issues   Diagnosis and orders addressed:  1. Primary hypertension Low sodium diet - CBC with Differential/Platelet - CMP14+EGFR - benazepril (LOTENSIN) 40 MG tablet; Take 1 tablet (40 mg total) by mouth daily.  Dispense: 90 tablet; Refill: 1 - hydrochlorothiazide (HYDRODIURIL) 25 MG tablet; Take 1 tablet (25  mg total) by mouth daily.  Dispense: 90 tablet; Refill: 1 - amLODipine (NORVASC) 10 MG tablet; Take 1 tablet (10 mg total) by mouth daily.  Dispense: 90 tablet; Refill: 1  2. Pure hypercholesterolemia Low fat diet - Lipid panel - rosuvastatin (CRESTOR) 10 MG tablet; Take 1 tablet (10 mg total) by mouth daily.  Dispense: 90 tablet; Refill: 1  3. Type 2 diabetes mellitus without complication, without long-term current use of insulin (HCC) Carb counting - Bayer DCA Hb A1c Waived - PSA, total and free - metFORMIN (GLUCOPHAGE) 1000 MG tablet; Take 1 tablet (1,000 mg total) by mouth 2 (two) times daily with a meal. (NEEDS TO BE SEEN BEFORE NEXT REFILL)  Dispense: 180 tablet; Refill: 1  4. Gastroesophageal reflux disease without esophagitis Avoid spicy foods Do not eat 2 hours prior to bedtime - omeprazole (PRILOSEC) 40 MG capsule; Take 1 capsule (40 mg total) by mouth daily.  Dispense: 90 capsule; Refill: 1  5. Erectile dysfunction associated with type 2 diabetes mellitus ([place]) Doe snot want to do anything about this  6. BMI 29.0-29.9,adult Discussed diet and exercise for person with BMI >25 Will recheck weight in 3-6 months    Labs pending Health Maintenance reviewed Diet and exercise encouraged  Follow up plan: 6 months   Mary-Margaret Hassell Done, FNP

## 2022-06-07 LAB — CBC WITH DIFFERENTIAL/PLATELET
Basophils Absolute: 0 10*3/uL (ref 0.0–0.2)
Basos: 1 %
EOS (ABSOLUTE): 0.4 10*3/uL (ref 0.0–0.4)
Eos: 6 %
Hematocrit: 39.4 % (ref 37.5–51.0)
Hemoglobin: 13.4 g/dL (ref 13.0–17.7)
Immature Grans (Abs): 0 10*3/uL (ref 0.0–0.1)
Immature Granulocytes: 0 %
Lymphocytes Absolute: 2.1 10*3/uL (ref 0.7–3.1)
Lymphs: 30 %
MCH: 28.8 pg (ref 26.6–33.0)
MCHC: 34 g/dL (ref 31.5–35.7)
MCV: 85 fL (ref 79–97)
Monocytes Absolute: 0.7 10*3/uL (ref 0.1–0.9)
Monocytes: 9 %
Neutrophils Absolute: 3.7 10*3/uL (ref 1.4–7.0)
Neutrophils: 54 %
Platelets: 225 10*3/uL (ref 150–450)
RBC: 4.65 x10E6/uL (ref 4.14–5.80)
RDW: 12.8 % (ref 11.6–15.4)
WBC: 6.9 10*3/uL (ref 3.4–10.8)

## 2022-06-07 LAB — CMP14+EGFR
ALT: 15 IU/L (ref 0–44)
AST: 13 IU/L (ref 0–40)
Albumin/Globulin Ratio: 2 (ref 1.2–2.2)
Albumin: 4.4 g/dL (ref 3.8–4.8)
Alkaline Phosphatase: 55 IU/L (ref 44–121)
BUN/Creatinine Ratio: 13 (ref 10–24)
BUN: 13 mg/dL (ref 8–27)
Bilirubin Total: 1.6 mg/dL — ABNORMAL HIGH (ref 0.0–1.2)
CO2: 22 mmol/L (ref 20–29)
Calcium: 9.8 mg/dL (ref 8.6–10.2)
Chloride: 104 mmol/L (ref 96–106)
Creatinine, Ser: 1.03 mg/dL (ref 0.76–1.27)
Globulin, Total: 2.2 g/dL (ref 1.5–4.5)
Glucose: 133 mg/dL — ABNORMAL HIGH (ref 70–99)
Potassium: 5.2 mmol/L (ref 3.5–5.2)
Sodium: 141 mmol/L (ref 134–144)
Total Protein: 6.6 g/dL (ref 6.0–8.5)
eGFR: 76 mL/min/{1.73_m2} (ref >59)

## 2022-06-07 LAB — LIPID PANEL
Chol/HDL Ratio: 2.2 ratio (ref 0.0–5.0)
Cholesterol, Total: 99 mg/dL — ABNORMAL LOW (ref 100–199)
HDL: 45 mg/dL (ref >39)
LDL Chol Calc (NIH): 39 mg/dL (ref 0–99)
Triglycerides: 69 mg/dL (ref 0–149)
VLDL Cholesterol Cal: 15 mg/dL (ref 5–40)

## 2022-06-07 LAB — PSA, TOTAL AND FREE
PSA, Free Pct: 40.4 %
PSA, Free: 0.97 ng/mL
Prostate Specific Ag, Serum: 2.4 ng/mL (ref 0.0–4.0)

## 2022-06-25 ENCOUNTER — Encounter: Payer: Self-pay | Admitting: *Deleted

## 2022-08-14 DIAGNOSIS — E1142 Type 2 diabetes mellitus with diabetic polyneuropathy: Secondary | ICD-10-CM | POA: Diagnosis not present

## 2022-08-14 DIAGNOSIS — L84 Corns and callosities: Secondary | ICD-10-CM | POA: Diagnosis not present

## 2022-08-14 DIAGNOSIS — B351 Tinea unguium: Secondary | ICD-10-CM | POA: Diagnosis not present

## 2022-08-14 DIAGNOSIS — M79676 Pain in unspecified toe(s): Secondary | ICD-10-CM | POA: Diagnosis not present

## 2022-08-30 NOTE — Patient Instructions (Signed)
Jorge Garcia , Thank you for taking time to come for your Medicare Wellness Visit. I appreciate your ongoing commitment to your health goals. Please review the following plan we discussed and let me know if I can assist you in the future.   These are the goals we discussed:  Goals      DIET - REDUCE SUGAR INTAKE     Eat more fruits and vegetables     Increase vegetables servings to 3-5 per day       Exercise 150 min/wk Moderate Activity     Try to walk or engage in physical activity for at least 30 minutes 5 times a week          This is a list of the screening recommended for you and due dates:  Health Maintenance  Topic Date Due   Eye exam for diabetics  06/05/2021   COVID-19 Vaccine (4 - 2023-24 season) 04/11/2022   Medicare Annual Wellness Visit  05/28/2022   Complete foot exam   05/31/2022   Zoster (Shingles) Vaccine (1 of 2) 09/06/2022*   Yearly kidney health urinalysis for diabetes  12/06/2022   Hemoglobin A1C  12/06/2022   Yearly kidney function blood test for diabetes  06/07/2023   Colon Cancer Screening  04/22/2025   DTaP/Tdap/Td vaccine (3 - Tdap) 12/05/2028   Pneumonia Vaccine  Completed   Flu Shot  Completed   Hepatitis C Screening: USPSTF Recommendation to screen - Ages 15-79 yo.  Completed   HPV Vaccine  Aged Out  *Topic was postponed. The date shown is not the original due date.    Advanced directives: Please bring a copy of your health care power of attorney and living will to the office to be added to your chart at your convenience.   Conditions/risks identified: Aim for 30 minutes of exercise or brisk walking, 6-8 glasses of water, and 5 servings of fruits and vegetables each day.   Next appointment: Follow up in one year for your annual wellness visit.   Preventive Care 75 Years and Older, Male  Preventive care refers to lifestyle choices and visits with your health care provider that can promote health and wellness. What does preventive care  include? A yearly physical exam. This is also called an annual well check. Dental exams once or twice a year. Routine eye exams. Ask your health care provider how often you should have your eyes checked. Personal lifestyle choices, including: Daily care of your teeth and gums. Regular physical activity. Eating a healthy diet. Avoiding tobacco and drug use. Limiting alcohol use. Practicing safe sex. Taking low doses of aspirin every day. Taking vitamin and mineral supplements as recommended by your health care provider. What happens during an annual well check? The services and screenings done by your health care provider during your annual well check will depend on your age, overall health, lifestyle risk factors, and family history of disease. Counseling  Your health care provider may ask you questions about your: Alcohol use. Tobacco use. Drug use. Emotional well-being. Home and relationship well-being. Sexual activity. Eating habits. History of falls. Memory and ability to understand (cognition). Work and work Statistician. Screening  You may have the following tests or measurements: Height, weight, and BMI. Blood pressure. Lipid and cholesterol levels. These may be checked every 5 years, or more frequently if you are over 25 years old. Skin check. Lung cancer screening. You may have this screening every year starting at age 75 if you have a 30-pack-year  history of smoking and currently smoke or have quit within the past 15 years. Fecal occult blood test (FOBT) of the stool. You may have this test every year starting at age 75. Flexible sigmoidoscopy or colonoscopy. You may have a sigmoidoscopy every 5 years or a colonoscopy every 10 years starting at age 75. Prostate cancer screening. Recommendations will vary depending on your family history and other risks. Hepatitis C blood test. Hepatitis B blood test. Sexually transmitted disease (STD) testing. Diabetes screening. This  is done by checking your blood sugar (glucose) after you have not eaten for a while (fasting). You may have this done every 1-3 years. Abdominal aortic aneurysm (AAA) screening. You may need this if you are a current or former smoker. Osteoporosis. You may be screened starting at age 50 if you are at high risk. Talk with your health care provider about your test results, treatment options, and if necessary, the need for more tests. Vaccines  Your health care provider may recommend certain vaccines, such as: Influenza vaccine. This is recommended every year. Tetanus, diphtheria, and acellular pertussis (Tdap, Td) vaccine. You may need a Td booster every 10 years. Zoster vaccine. You may need this after age 75. Pneumococcal 13-valent conjugate (PCV13) vaccine. One dose is recommended after age 75. Pneumococcal polysaccharide (PPSV23) vaccine. One dose is recommended after age 75. Talk to your health care provider about which screenings and vaccines you need and how often you need them. This information is not intended to replace advice given to you by your health care provider. Make sure you discuss any questions you have with your health care provider. Document Released: 08/24/2015 Document Revised: 04/16/2016 Document Reviewed: 05/29/2015 Elsevier Interactive Patient Education  2017 [place] Prevention in the Home Falls can cause injuries. They can happen to people of all ages. There are many things you can do to make your home safe and to help prevent falls. What can I do on the outside of my home? Regularly fix the edges of walkways and driveways and fix any cracks. Remove anything that might make you trip as you walk through a door, such as a raised step or threshold. Trim any bushes or trees on the path to your home. Use bright outdoor lighting. Clear any walking paths of anything that might make someone trip, such as rocks or tools. Regularly check to see if handrails are  loose or broken. Make sure that both sides of any steps have handrails. Any raised decks and porches should have guardrails on the edges. Have any leaves, snow, or ice cleared regularly. Use sand or salt on walking paths during winter. Clean up any spills in your garage right away. This includes oil or grease spills. What can I do in the bathroom? Use night lights. Install grab bars by the toilet and in the tub and shower. Do not use towel bars as grab bars. Use non-skid mats or decals in the tub or shower. If you need to sit down in the shower, use a plastic, non-slip stool. Keep the floor dry. Clean up any water that spills on the floor as soon as it happens. Remove soap buildup in the tub or shower regularly. Attach bath mats securely with double-sided non-slip rug tape. Do not have throw rugs and other things on the floor that can make you trip. What can I do in the bedroom? Use night lights. Make sure that you have a light by your bed that is easy to reach. Do  not use any sheets or blankets that are too big for your bed. They should not hang down onto the floor. Have a firm chair that has side arms. You can use this for support while you get dressed. Do not have throw rugs and other things on the floor that can make you trip. What can I do in the kitchen? Clean up any spills right away. Avoid walking on wet floors. Keep items that you use a lot in easy-to-reach places. If you need to reach something above you, use a strong step stool that has a grab bar. Keep electrical cords out of the way. Do not use floor polish or wax that makes floors slippery. If you must use wax, use non-skid floor wax. Do not have throw rugs and other things on the floor that can make you trip. What can I do with my stairs? Do not leave any items on the stairs. Make sure that there are handrails on both sides of the stairs and use them. Fix handrails that are broken or loose. Make sure that handrails are as  long as the stairways. Check any carpeting to make sure that it is firmly attached to the stairs. Fix any carpet that is loose or worn. Avoid having throw rugs at the top or bottom of the stairs. If you do have throw rugs, attach them to the floor with carpet tape. Make sure that you have a light switch at the top of the stairs and the bottom of the stairs. If you do not have them, ask someone to add them for you. What else can I do to help prevent falls? Wear shoes that: Do not have high heels. Have rubber bottoms. Are comfortable and fit you well. Are closed at the toe. Do not wear sandals. If you use a stepladder: Make sure that it is fully opened. Do not climb a closed stepladder. Make sure that both sides of the stepladder are locked into place. Ask someone to hold it for you, if possible. Clearly mark and make sure that you can see: Any grab bars or handrails. First and last steps. Where the edge of each step is. Use tools that help you move around (mobility aids) if they are needed. These include: Canes. Walkers. Scooters. Crutches. Turn on the lights when you go into a dark area. Replace any light bulbs as soon as they burn out. Set up your furniture so you have a clear path. Avoid moving your furniture around. If any of your floors are uneven, fix them. If there are any pets around you, be aware of where they are. Review your medicines with your doctor. Some medicines can make you feel dizzy. This can increase your chance of falling. Ask your doctor what other things that you can do to help prevent falls. This information is not intended to replace advice given to you by your health care provider. Make sure you discuss any questions you have with your health care provider. Document Released: 05/24/2009 Document Revised: 01/03/2016 Document Reviewed: 09/01/2014 Elsevier Interactive Patient Education  2017 [employer].

## 2022-08-31 NOTE — Progress Notes (Signed)
Subjective:   Trayshawn Durkin is a 77 y.o. male who presents for Medicare Annual/Subsequent preventive examination.  I connected with  Deondre Marinaro on 09/01/22 by a audio enabled telemedicine application and verified that I am speaking with the correct person using two identifiers.  Patient Location: Home  Provider Location: Home Office  I discussed the limitations of evaluation and management by telemedicine. The patient expressed understanding and agreed to proceed.  Review of Systems     Cardiac Risk Factors include: advanced age (>74mn, >>26women);diabetes mellitus;dyslipidemia;hypertension;male gender     Objective:    Today's Vitals   09/01/22 0850  Weight: 205 lb (93 kg)  Height: _0  (1.854 m)   Body mass index is 27.05 kg/m.     09/01/2022    8:56 AM 05/28/2021    2:57 PM 01/31/2019   11:04 AM 12/06/2018    6:12 PM 01/27/2018   10:59 AM 01/26/2017   10:36 AM 06/12/2014   10:36 AM  Advanced Directives  Does Patient Have a Medical Advance Directive? _1  Yes Yes  Type of Advance Directive Living will;Healthcare Power of [place] Living will Living will [place]   Does patient want to make changes to medical advance directive? No - Patient declined  No - Patient declined No - Patient declined No - Patient declined    Copy of HH[place] Chart? No - copy requested No - copy requested   No - copy requested No - copy requested No - copy requested    Current Medications (verified) Outpatient Encounter Medications as of 09/01/2022  Medication Sig   Accu-Chek Softclix Lancets lancets USE 1 LANCET TO CHECK GLUCOSE 4 TIMES DAILY   amLODipine (NORVASC) 10 MG tablet Take 1 tablet (10 mg total) by mouth daily.   aspirin EC 81 MG tablet Take 81 mg by mouth daily.   benazepril (LOTENSIN) 40 MG tablet Take 1 tablet (40 mg total) by  mouth daily.   cholecalciferol (VITAMIN D) 1000 UNITS tablet Take 2,000 Units by mouth daily.    fish oil-omega-3 fatty acids 1000 MG capsule Take 1 g by mouth daily.   glucose blood (ACCU-CHEK AVIVA PLUS) test strip CHECK GLUCOSE 4 TIMES DAILY Dx E11.9   hydrochlorothiazide (HYDRODIURIL) 25 MG tablet Take 1 tablet (25 mg total) by mouth daily.   ibuprofen (ADVIL) 400 MG tablet Take 1 tablet (400 mg total) by mouth every 6 (six) hours as needed.   metFORMIN (GLUCOPHAGE) 1000 MG tablet Take 1 tablet (1,000 mg total) by mouth 2 (two) times daily with a meal. (NEEDS TO BE SEEN BEFORE NEXT REFILL)   omeprazole (PRILOSEC) 40 MG capsule Take 1 capsule (40 mg total) by mouth daily.   rosuvastatin (CRESTOR) 10 MG tablet Take 1 tablet (10 mg total) by mouth daily.   No facility-administered encounter medications on file as of 09/01/2022.    Allergies (verified) Patient has no known allergies.   History: Past Medical History:  Diagnosis Date   Arthritis    back   Cataract    bilateral sx   Diabetes mellitus without complication ([place]    on meds   GERD (gastroesophageal reflux disease)    on meds   Hyperlipidemia    on meds   Hypertension    on meds   Internal hemorrhoids    Tubulovillous adenoma of colon 2009   Vitamin D deficiency  Past Surgical History:  Procedure Laterality Date   CATARACT EXTRACTION, BILATERAL Bilateral    COLONOSCOPY  12/01/2013   COLONOSCOPY  2020   MS-MAC-suprep(good)-tics/hems/HPPx1/TAx4   COLONOSCOPY  04/22/2022   HEMORRHOID SURGERY  1978   POLYPECTOMY  2020   HPPx1/TAx4   UPPER GASTROINTESTINAL ENDOSCOPY     WISDOM TOOTH EXTRACTION     Family History  Problem Relation Age of Onset   Diabetes Mother    Hypertension Mother    Stroke Mother 73   Diabetes Father    Hypertension Father    Heart attack Father 70   Colon cancer Neg Hx    Colon polyps Neg Hx    Esophageal cancer Neg Hx    Stomach cancer Neg Hx    Rectal cancer Neg Hx    Social  History   Socioeconomic History   Marital status: Single    Spouse name: Not on file   Number of children: 0   Years of education: 12   Highest education level: 12th grade  Occupational History   Occupation: Retired    Fish farm manager: [employer]  Tobacco Use   Smoking status: Former    Packs/day: 2.00    Years: 40.00    Total pack years: 80.00    Types: Cigarettes    Quit date: 11/09/2005    Years since quitting: 16.8   Smokeless tobacco: Never  Vaping Use   Vaping Use: Never used  Substance and Sexual Activity   Alcohol use: No   Drug use: No   Sexual activity: Not Currently  Other Topics Concern   Not on file  Social History Narrative   Lives alone. No children    VA benefits   Social Determinants of Health   Financial Resource Strain: Low Risk  (09/01/2022)   Overall Financial Resource Strain (CARDIA)    Difficulty of Paying Living Expenses: Not hard at all  Food Insecurity: No Food Insecurity (09/01/2022)   Hunger Vital Sign    Worried About Running Out of Food in the Last Year: Never true    Ran Out of Food in the Last Year: Never true  Transportation Needs: No Transportation Needs (09/01/2022)   PRAPARE - Hydrologist (Medical): No    Lack of Transportation (Non-Medical): No  Physical Activity: Sufficiently Active (09/01/2022)   Exercise Vital Sign    Days of Exercise per Week: 5 days    Minutes of Exercise per Session: 30 min  Stress: No Stress Concern Present (09/01/2022)   [place]    Feeling of Stress : Not at all  Social Connections: Moderately Integrated (09/01/2022)   Social Connection and Isolation Panel [NHANES]    Frequency of Communication with Friends and Family: More than three times a week    Frequency of Social Gatherings with Friends and Family: Three times a week    Attends Religious Services: More than 4 times per year    Active Member of Clubs or  Organizations: Yes    Attends Music therapist: More than 4 times per year    Marital Status: Divorced    Tobacco Counseling Counseling given: Not Answered   Clinical Intake:  Pre-visit preparation completed: Yes  Pain : No/denies pain  Diabetes: Yes CBG done?: No Did pt. bring in CBG monitor from home?: No  How often do you need to have someone help you when you read instructions, pamphlets, or other written materials from  your doctor or pharmacy?: 1 - Never  Diabetic?Yes Nutrition Risk Assessment:  Has the patient had any N/V/D within the last 2 months?  No  Does the patient have any non-healing wounds?  No  Has the patient had any unintentional weight loss or weight gain?  No   Diabetes:  Is the patient diabetic?  Yes  If diabetic, was a CBG obtained today?  No  Did the patient bring in their glucometer from home?  No  How often do you monitor your CBG's? Daily .   Financial Strains and Diabetes Management:  Are you having any financial strains with the device, your supplies or your medication? No .  Does the patient want to be seen by Chronic Care Management for management of their diabetes?  No  Would the patient like to be referred to a Nutritionist or for Diabetic Management?  No   Diabetic Exams:  Diabetic Eye Exam: Overdue for diabetic eye exam. Pt has been advised about the importance in completing this exam. Patient advised to call and schedule an eye exam. Diabetic Foot Exam: Overdue, Pt has been advised about the importance in completing this exam. Pt is scheduled for diabetic foot exam on next appointment date.   Interpreter Needed?: No  Information entered by :: Denman George LPN   Activities of Daily Living    09/01/2022    8:57 AM  In your present state of health, do you have any difficulty performing the following activities:  Hearing? 0  Vision? 0  Difficulty concentrating or making decisions? 0  Walking or climbing stairs? 0   Dressing or bathing? 0  Doing errands, shopping? 0  Preparing Food and eating ? N  Using the Toilet? N  In the past six months, have you accidently leaked urine? N  Do you have problems with loss of bowel control? N  Managing your Medications? N  Managing your Finances? N  Housekeeping or managing your Housekeeping? N    Patient Care Team: Chevis Pretty, FNP as PCP - General (Family Medicine) Clinic, [place] any recent Medical Services you may have received from other than [facility] providers in the past year (date may be approximate).     Assessment:   This is a routine wellness examination for [place].  Hearing/Vision screen Hearing Screening - Comments:: Denies hearing difficulties  Vision Screening - Comments:: Denies vision problems; will schedule a diabetic eye exam this year   Dietary issues and exercise activities discussed: Current Exercise Habits: Home exercise routine, Type of exercise: walking, Time (Minutes): 30, Frequency (Times/Week): 5, Weekly Exercise (Minutes/Week): 150, Intensity: Mild   Goals Addressed   None    Depression Screen    09/01/2022   11:37 AM 06/06/2022    9:13 AM 02/13/2022    8:12 AM 12/05/2021   10:43 AM 05/31/2021   10:33 AM 05/28/2021    2:58 PM 11/28/2020   10:11 AM  PHQ 2/9 Scores  PHQ - 2 Score 0 0 0 0 0 0 0  PHQ- 9 Score  0 0 0 0      Fall Risk    09/01/2022    8:53 AM 06/06/2022    9:13 AM 02/13/2022    8:12 AM 12/05/2021   10:43 AM 05/31/2021   10:33 AM  Fall Risk   Falls in the past year? 0 0 0 0 0  Number falls in past yr: 0      Injury with Fall? 0  Follow up Falls prevention discussed;Education provided;Falls evaluation completed        FALL RISK PREVENTION PERTAINING TO THE HOME:  Any stairs in or around the home? Yes  If so, are there any without handrails? No  Home free of loose throw rugs in walkways, pet beds, electrical cords, etc? Yes  Adequate lighting in your home to reduce risk  of falls? Yes   ASSISTIVE DEVICES UTILIZED TO PREVENT FALLS:  Life alert? No  Use of a cane, walker or w/c? No  Grab bars in the bathroom? Yes  Shower chair or bench in shower? No  Elevated toilet seat or a handicapped toilet? Yes   TIMED UP AND GO:  Was the test performed? No . Telephonic visit   Cognitive Function:    01/27/2018   11:06 AM 01/26/2017   11:07 AM  MMSE - Mini Mental State Exam  Orientation to time 5 5  Orientation to Place 5 5  Registration 3 3  Attention/ Calculation 5 5  Recall 2 2  Language- name 2 objects 2 2  Language- repeat 1 0  Language- follow 3 step command 3 3  Language- read & follow direction 1 1  Write a sentence 0 1  Copy design 1 1  Total score 28 28        09/01/2022    8:58 AM 05/28/2021    3:03 PM 01/31/2019   11:07 AM  6CIT Screen  What Year? 0 points 0 points 0 points  What month? 0 points 0 points 0 points  What time? 0 points 0 points 0 points  Count back from 20 0 points 0 points 0 points  Months in reverse 0 points 0 points 0 points  Repeat phrase 0 points 0 points 0 points  Total Score 0 points 0 points 0 points    Immunizations Immunization History  Administered Date(s) Administered   Fluad Quad(high Dose 65+) 05/23/2019, 05/04/2020, 05/31/2021   Influenza, High Dose Seasonal PF 05/19/2016, 05/26/2017, 05/25/2018   Influenza-Unspecified 05/11/2013, 05/20/2014, 05/26/2015, 06/04/2022   Moderna Sars-Covid-2 Vaccination 09/14/2019, 10/12/2019, 06/19/2020   Pneumococcal Conjugate-13 05/27/2013   Pneumococcal Polysaccharide-23 12/22/2014   Td 05/12/2003, 12/06/2018    TDAP status: Up to date  Flu Vaccine status: Up to date  Pneumococcal vaccine status: Up to date  Covid-19 vaccine status: Information provided on how to obtain vaccines.   Qualifies for Shingles Vaccine? Yes   Zostavax completed No   Shingrix Completed?: No.    Education has been provided regarding the importance of this vaccine. Patient has been  advised to call insurance company to determine out of pocket expense if they have not yet received this vaccine. Advised may also receive vaccine at local pharmacy or Health Dept. Verbalized acceptance and understanding.  Screening Tests Health Maintenance  Topic Date Due   OPHTHALMOLOGY EXAM  06/05/2021   COVID-19 Vaccine (4 - 2023-24 season) 04/11/2022   FOOT EXAM  05/31/2022   Zoster Vaccines- Shingrix (1 of 2) 09/06/2022 (Originally [birth year]/1998)   Diabetic kidney evaluation - Urine ACR  12/06/2022   HEMOGLOBIN A1C  12/06/2022   Diabetic kidney evaluation - eGFR measurement  06/07/2023   Medicare Annual Wellness (AWV)  09/02/2023   COLONOSCOPY (Pts 45-68yr Insurance coverage will need to be confirmed)  04/22/2025   DTaP/Tdap/Td (3 - Tdap) 12/05/2028   Pneumonia Vaccine 75 Years old  Completed   INFLUENZA VACCINE  Completed   Hepatitis C Screening  Completed   HPV VACCINES  Aged Out  Health Maintenance  Health Maintenance Due  Topic Date Due   OPHTHALMOLOGY EXAM  06/05/2021   COVID-19 Vaccine (4 - 2023-24 season) 04/11/2022   FOOT EXAM  05/31/2022    Colorectal cancer screening: Type of screening: Colonoscopy. Completed 04/22/22. Repeat every 3 years  Lung Cancer Screening: (Low Dose CT Chest recommended if Age 33-80 years, 30 pack-year currently smoking OR have quit w/in 15years.) does not qualify.   Lung Cancer Screening Referral: n/a   Additional Screening:  Hepatitis C Screening: does qualify; Completed 02/08/16  Vision Screening: Recommended annual ophthalmology exams for early detection of glaucoma and other disorders of the eye. Is the patient up to date with their annual eye exam?  No  Who is the provider or what is the name of the office in which the patient attends annual eye exams? None  If pt is not established with a provider, would they like to be referred to a provider to establish care? No .   Dental Screening: Recommended annual dental exams for proper  oral hygiene  Community Resource Referral / Chronic Care Management: CRR required this visit?  No   CCM required this visit?  No      Plan:     I have personally reviewed and noted the following in the patient's chart:   Medical and social history Use of alcohol, tobacco or illicit drugs  Current medications and supplements including opioid prescriptions. Patient is not currently taking opioid prescriptions. Functional ability and status Nutritional status Physical activity Advanced directives List of other physicians Hospitalizations, surgeries, and ER visits in previous 12 months Vitals Screenings to include cognitive, depression, and falls Referrals and appointments  In addition, I have reviewed and discussed with patient certain preventive protocols, quality metrics, and best practice recommendations. A written personalized care plan for preventive services as well as general preventive health recommendations were provided to patient.     Vanetta Mulders, [place]   9/39/0300   Due to this being a virtual visit, the after visit summary with patients personalized plan was offered to patient via mail or my-chart. per request, patient was mailed a copy of AVS.  Nurse Notes: No concerns

## 2022-09-01 ENCOUNTER — Ambulatory Visit (INDEPENDENT_AMBULATORY_CARE_PROVIDER_SITE_OTHER): Payer: Medicare HMO

## 2022-09-01 VITALS — Ht 73.0 in | Wt 205.0 lb

## 2022-09-01 DIAGNOSIS — Z0000 Encounter for general adult medical examination without abnormal findings: Secondary | ICD-10-CM | POA: Diagnosis not present

## 2022-10-23 DIAGNOSIS — L84 Corns and callosities: Secondary | ICD-10-CM | POA: Diagnosis not present

## 2022-10-23 DIAGNOSIS — M79676 Pain in unspecified toe(s): Secondary | ICD-10-CM | POA: Diagnosis not present

## 2022-10-23 DIAGNOSIS — E1142 Type 2 diabetes mellitus with diabetic polyneuropathy: Secondary | ICD-10-CM | POA: Diagnosis not present

## 2022-10-23 DIAGNOSIS — L603 Nail dystrophy: Secondary | ICD-10-CM | POA: Diagnosis not present

## 2022-12-05 ENCOUNTER — Ambulatory Visit: Payer: Medicare HMO | Admitting: Nurse Practitioner

## 2022-12-11 ENCOUNTER — Ambulatory Visit (INDEPENDENT_AMBULATORY_CARE_PROVIDER_SITE_OTHER): Payer: Medicare HMO

## 2022-12-11 ENCOUNTER — Encounter: Payer: Self-pay | Admitting: Nurse Practitioner

## 2022-12-11 ENCOUNTER — Ambulatory Visit (INDEPENDENT_AMBULATORY_CARE_PROVIDER_SITE_OTHER): Payer: Medicare HMO | Admitting: Nurse Practitioner

## 2022-12-11 VITALS — BP 127/69 | HR 74 | Temp 97.1°F | Resp 20 | Ht 73.0 in | Wt 210.0 lb

## 2022-12-11 DIAGNOSIS — R5383 Other fatigue: Secondary | ICD-10-CM

## 2022-12-11 DIAGNOSIS — Z6829 Body mass index (BMI) 29.0-29.9, adult: Secondary | ICD-10-CM

## 2022-12-11 DIAGNOSIS — D649 Anemia, unspecified: Secondary | ICD-10-CM | POA: Diagnosis not present

## 2022-12-11 DIAGNOSIS — E785 Hyperlipidemia, unspecified: Secondary | ICD-10-CM | POA: Diagnosis not present

## 2022-12-11 DIAGNOSIS — E119 Type 2 diabetes mellitus without complications: Secondary | ICD-10-CM

## 2022-12-11 DIAGNOSIS — Z7984 Long term (current) use of oral hypoglycemic drugs: Secondary | ICD-10-CM

## 2022-12-11 DIAGNOSIS — R0602 Shortness of breath: Secondary | ICD-10-CM | POA: Diagnosis not present

## 2022-12-11 DIAGNOSIS — K219 Gastro-esophageal reflux disease without esophagitis: Secondary | ICD-10-CM

## 2022-12-11 DIAGNOSIS — D519 Vitamin B12 deficiency anemia, unspecified: Secondary | ICD-10-CM | POA: Diagnosis not present

## 2022-12-11 DIAGNOSIS — I10 Essential (primary) hypertension: Secondary | ICD-10-CM

## 2022-12-11 DIAGNOSIS — E1169 Type 2 diabetes mellitus with other specified complication: Secondary | ICD-10-CM | POA: Diagnosis not present

## 2022-12-11 DIAGNOSIS — E559 Vitamin D deficiency, unspecified: Secondary | ICD-10-CM | POA: Diagnosis not present

## 2022-12-11 LAB — CMP14+EGFR
ALT: 60 IU/L — ABNORMAL HIGH (ref 0–44)
AST: 47 IU/L — ABNORMAL HIGH (ref 0–40)
Albumin/Globulin Ratio: 1.3 (ref 1.2–2.2)
Albumin: 3.7 g/dL — ABNORMAL LOW (ref 3.8–4.8)
Alkaline Phosphatase: 104 IU/L (ref 44–121)
BUN/Creatinine Ratio: 18 (ref 10–24)
BUN: 26 mg/dL (ref 8–27)
Bilirubin Total: 1.7 mg/dL — ABNORMAL HIGH (ref 0.0–1.2)
CO2: 21 mmol/L (ref 20–29)
Calcium: 9.3 mg/dL (ref 8.6–10.2)
Chloride: 99 mmol/L (ref 96–106)
Creatinine, Ser: 1.44 mg/dL — ABNORMAL HIGH (ref 0.76–1.27)
Globulin, Total: 2.8 g/dL (ref 1.5–4.5)
Glucose: 165 mg/dL — ABNORMAL HIGH (ref 70–99)
Potassium: 4.8 mmol/L (ref 3.5–5.2)
Sodium: 139 mmol/L (ref 134–144)
Total Protein: 6.5 g/dL (ref 6.0–8.5)
eGFR: 51 mL/min/{1.73_m2} — ABNORMAL LOW (ref >59)

## 2022-12-11 LAB — CBC WITH DIFFERENTIAL/PLATELET
Basophils Absolute: 0 10*3/uL (ref 0.0–0.2)
Basos: 0 %
EOS (ABSOLUTE): 0.2 10*3/uL (ref 0.0–0.4)
Eos: 2 %
Hematocrit: 38.2 % (ref 37.5–51.0)
Hemoglobin: 12.9 g/dL — ABNORMAL LOW (ref 13.0–17.7)
Immature Grans (Abs): 0 10*3/uL (ref 0.0–0.1)
Immature Granulocytes: 0 %
Lymphocytes Absolute: 1.7 10*3/uL (ref 0.7–3.1)
Lymphs: 15 %
MCH: 28.2 pg (ref 26.6–33.0)
MCHC: 33.8 g/dL (ref 31.5–35.7)
MCV: 83 fL (ref 79–97)
Monocytes Absolute: 1.1 10*3/uL — ABNORMAL HIGH (ref 0.1–0.9)
Monocytes: 10 %
Neutrophils Absolute: 8 10*3/uL — ABNORMAL HIGH (ref 1.4–7.0)
Neutrophils: 73 %
Platelets: 301 10*3/uL (ref 150–450)
RBC: 4.58 x10E6/uL (ref 4.14–5.80)
RDW: 13 % (ref 11.6–15.4)
WBC: 11 10*3/uL — ABNORMAL HIGH (ref 3.4–10.8)

## 2022-12-11 LAB — LIPID PANEL
Chol/HDL Ratio: 3.5 ratio (ref 0.0–5.0)
Cholesterol, Total: 111 mg/dL (ref 100–199)
HDL: 32 mg/dL — ABNORMAL LOW (ref >39)
LDL Chol Calc (NIH): 58 mg/dL (ref 0–99)
Triglycerides: 114 mg/dL (ref 0–149)
VLDL Cholesterol Cal: 21 mg/dL (ref 5–40)

## 2022-12-11 LAB — BAYER DCA HB A1C WAIVED: HB A1C (BAYER DCA - WAIVED): 6.9 % — ABNORMAL HIGH (ref 4.8–5.6)

## 2022-12-11 MED ORDER — AMLODIPINE BESYLATE 10 MG PO TABS
10.0000 mg | ORAL_TABLET | Freq: Every day | ORAL | 1 refills | Status: DC
Start: 2022-12-11 — End: 2023-06-12

## 2022-12-11 MED ORDER — ROSUVASTATIN CALCIUM 10 MG PO TABS: 10.0000 mg | ORAL_TABLET | Freq: Every day | ORAL | 1 refills | Status: DC

## 2022-12-11 MED ORDER — OMEPRAZOLE 40 MG PO CPDR
40.0000 mg | DELAYED_RELEASE_CAPSULE | Freq: Every day | ORAL | 1 refills | Status: DC
Start: 2022-12-11 — End: 2023-06-12

## 2022-12-11 MED ORDER — METFORMIN HCL 1000 MG PO TABS
1000.0000 mg | ORAL_TABLET | Freq: Two times a day (BID) | ORAL | 1 refills | Status: DC
Start: 2022-12-11 — End: 2023-06-12

## 2022-12-11 MED ORDER — HYDROCHLOROTHIAZIDE 25 MG PO TABS
25.0000 mg | ORAL_TABLET | Freq: Every day | ORAL | 1 refills | Status: DC
Start: 2022-12-11 — End: 2023-06-12

## 2022-12-11 MED ORDER — BENAZEPRIL HCL 40 MG PO TABS
40.0000 mg | ORAL_TABLET | Freq: Every day | ORAL | 1 refills | Status: DC
Start: 2022-12-11 — End: 2023-06-12

## 2022-12-11 NOTE — Patient Instructions (Signed)
Fatigue If you have fatigue, you feel tired all the time and have a lack of energy or a lack of motivation. Fatigue may make it difficult to start or complete tasks because of exhaustion. Occasional or mild fatigue is often a normal response to activity or life. However, long-term (chronic) or extreme fatigue may be a symptom of a medical condition such as: Depression. Not having enough red blood cells or hemoglobin in the blood (anemia). A problem with a small gland located in the lower front part of the neck (thyroid disorder). Rheumatologic conditions. These are problems related to the body's defense system (immune system). Infections, especially certain viral infections. Fatigue can also lead to negative health outcomes over time. Follow these instructions at home: Medicines Take over-the-counter and prescription medicines only as told by your health care provider. Take a multivitamin if told by your health care provider. Do not use herbal or dietary supplements unless they are approved by your health care provider. Eating and drinking  Avoid heavy meals in the evening. Eat a well-balanced diet, which includes lean proteins, whole grains, plenty of fruits and vegetables, and low-fat dairy products. Avoid eating or drinking too many products with caffeine in them. Avoid alcohol. Drink enough fluid to keep your urine pale yellow. Activity  Exercise regularly, as told by your health care provider. Use or practice techniques to help you relax, such as yoga, tai chi, meditation, or massage therapy. Lifestyle Change situations that cause you stress. Try to keep your work and personal schedules in balance. Do not use recreational or illegal drugs. General instructions Monitor your fatigue for any changes. Go to bed and get up at the same time every day. Avoid fatigue by pacing yourself during the day and getting enough sleep at night. Maintain a healthy weight. Contact a health care  provider if: Your fatigue does not get better. You have a fever. You suddenly lose or gain weight. You have headaches. You have trouble falling asleep or sleeping through the night. You feel angry, guilty, anxious, or sad. You have swelling in your legs or another part of your body. Get help right away if: You feel confused, feel like you might faint, or faint. Your vision is blurry or you have a severe headache. You have severe pain in your abdomen, your back, or the area between your waist and hips (pelvis). You have chest pain, shortness of breath, or an irregular or fast heartbeat. You are unable to urinate, or you urinate less than normal. You have abnormal bleeding from the rectum, nose, lungs, nipples, or, if you are male, the vagina. You vomit blood. You have thoughts about hurting yourself or others. These symptoms may be an emergency. Get help right away. Call 911. Do not wait to see if the symptoms will go away. Do not drive yourself to the hospital. Get help right away if you feel like you may hurt yourself or others, or have thoughts about taking your own life. Go to your nearest emergency room or: Call 911. Call the National Suicide Prevention Lifeline at 1-800-273-8255 or 988. This is open 24 hours a day. Text the Crisis Text Line at 741741. Summary If you have fatigue, you feel tired all the time and have a lack of energy or a lack of motivation. Fatigue may make it difficult to start or complete tasks because of exhaustion. Long-term (chronic) or extreme fatigue may be a symptom of a medical condition. Exercise regularly, as told by your health care provider.   Change situations that cause you stress. Try to keep your work and personal schedules in balance. This information is not intended to replace advice given to you by your health care provider. Make sure you discuss any questions you have with your health care provider. Document Revised: 05/20/2021 Document  Reviewed: 05/20/2021 Elsevier Patient Education  2023 Elsevier Inc.  

## 2022-12-11 NOTE — Progress Notes (Signed)
Subjective:    Patient ID: Jorge Garcia, male    DOB: 24-Sep-1945, 77 y.o.   MRN: [id number]   Chief Complaint: medical management of chronic issues     HPI:  Jorge Garcia is a 77 y.o. who identifies as a male who was assigned male at birth.   Social history: Lives with: by himself Work history: retired   Water engineer in today for follow up of the following chronic medical issues:  1. Primary hypertension No c/o chest pain, sob or headache. Does not check blood pressure at home. BP Readings from Last 3 Encounters:  06/06/22 120/68  04/22/22 104/79  02/13/22 115/71     2. Hyperlipidemia associated with type 2 diabetes mellitus (HCC) Does [facility] diet and stays very active. Lab Results  Component Value Date   CHOL 99 (L) 06/06/2022   HDL 45 06/06/2022   LDLCALC 39 06/06/2022   TRIG 69 06/06/2022   CHOLHDL 2.2 06/06/2022     3. Type 2 diabetes mellitus without complication, without long-term current use of insulin (HCC) Fasting blood sugars are running around 100-140. No low blood sugars. Lab Results  Component Value Date   HGBA1C 6.7 (H) 06/06/2022     4. Gastroesophageal reflux disease without esophagitis Is on omeprazole and is doing well.   5. BMI 29.0-29.9,adult Weight is up 5 lbs. Wt Readings from Last 3 Encounters:  12/11/22 210 lb (95.3 kg)  09/01/22 205 lb (93 kg)  06/06/22 207 lb (93.9 kg)   BMI Readings from Last 3 Encounters:  12/11/22 27.71 kg/m  09/01/22 27.05 kg/m  06/06/22 27.31 kg/m      New complaints: Has been fatigued for over a week. Has no energy to do anything. Decreased appetite and says he has no energy to do anything. Not sleeping well.  No Known Allergies Outpatient Encounter Medications as of 12/11/2022  Medication Sig   Accu-Chek Softclix Lancets lancets USE 1 LANCET TO CHECK GLUCOSE 4 TIMES DAILY   amLODipine (NORVASC) 10 MG tablet Take 1 tablet (10 mg total) by mouth daily.   aspirin EC 81 MG tablet Take 81 mg by mouth  daily.   benazepril (LOTENSIN) 40 MG tablet Take 1 tablet (40 mg total) by mouth daily.   cholecalciferol (VITAMIN D) 1000 UNITS tablet Take 2,000 Units by mouth daily.    fish oil-omega-3 fatty acids 1000 MG capsule Take 1 g by mouth daily.   glucose blood (ACCU-CHEK AVIVA PLUS) test strip CHECK GLUCOSE 4 TIMES DAILY Dx E11.9   hydrochlorothiazide (HYDRODIURIL) 25 MG tablet Take 1 tablet (25 mg total) by mouth daily.   ibuprofen (ADVIL) 400 MG tablet Take 1 tablet (400 mg total) by mouth every 6 (six) hours as needed.   metFORMIN (GLUCOPHAGE) 1000 MG tablet Take 1 tablet (1,000 mg total) by mouth 2 (two) times daily with a meal. (NEEDS TO BE SEEN BEFORE NEXT REFILL)   omeprazole (PRILOSEC) 40 MG capsule Take 1 capsule (40 mg total) by mouth daily.   rosuvastatin (CRESTOR) 10 MG tablet Take 1 tablet (10 mg total) by mouth daily.   No facility-administered encounter medications on file as of 12/11/2022.    Past Surgical History:  Procedure Laterality Date   CATARACT EXTRACTION, BILATERAL Bilateral    COLONOSCOPY  12/01/2013   COLONOSCOPY  2020   MS-MAC-suprep(good)-tics/hems/HPPx1/TAx4   COLONOSCOPY  04/22/2022   HEMORRHOID SURGERY  1978   POLYPECTOMY  2020   HPPx1/TAx4   UPPER GASTROINTESTINAL ENDOSCOPY     WISDOM TOOTH EXTRACTION  Family History  Problem Relation Age of Onset   Diabetes Mother    Hypertension Mother    Stroke Mother 74   Diabetes Father    Hypertension Father    Heart attack Father 55   Colon cancer Neg Hx    Colon polyps Neg Hx    Esophageal cancer Neg Hx    Stomach cancer Neg Hx    Rectal cancer Neg Hx       Controlled substance contract: n/a     Review of Systems  Constitutional:  Negative for diaphoresis.  Eyes:  Negative for pain.  Respiratory:  Negative for shortness of breath.   Cardiovascular:  Negative for chest pain, palpitations and leg swelling.  Gastrointestinal:  Negative for abdominal pain.  Endocrine: Negative for polydipsia.   Skin:  Negative for rash.  Neurological:  Negative for dizziness, weakness and headaches.  Hematological:  Does not bruise/bleed easily.  All other systems reviewed and are negative.      Objective:   Physical Exam Vitals and nursing note reviewed.  Constitutional:      Appearance: Normal appearance. He is well-developed.  HENT:     Head: Normocephalic.     Nose: Nose normal.     Mouth/Throat:     Mouth: Mucous membranes are moist.     Pharynx: Oropharynx is clear.  Eyes:     Pupils: Pupils are equal, round, and reactive to light.  Neck:     Thyroid: No thyroid mass or thyromegaly.     Vascular: No carotid bruit or JVD.     Trachea: Phonation normal.  Cardiovascular:     Rate and Rhythm: Normal rate and regular rhythm.  Pulmonary:     Effort: Pulmonary effort is normal. No respiratory distress.     Breath sounds: Normal breath sounds.  Abdominal:     General: Bowel sounds are normal.     Palpations: Abdomen is soft.     Tenderness: There is no abdominal tenderness.  Musculoskeletal:        General: Normal range of motion.     Cervical back: Normal range of motion and neck supple.  Lymphadenopathy:     Cervical: No cervical adenopathy.  Skin:    General: Skin is warm and dry.  Neurological:     Mental Status: He is alert and oriented to person, place, and time.  Psychiatric:        Behavior: Behavior normal.        Thought Content: Thought content normal.        Judgment: Judgment normal.    BP 127/69   Pulse 74   Temp (!) 97.1 F (36.2 C) (Temporal)   Resp 20   Ht 6\' 1"  (1.854 m)   Wt 210 lb (95.3 kg)   SpO2 96%   BMI 27.71 kg/m   Hgba1c 6.9% Chest xray unchanged from previous-Preliminary reading by Paulene Floor, FNP  [facility]  EKG- NSR with occasional PVC's-Mary-Margaret Daphine Deutscher, FNP     Assessment & Plan:   Caprice Wasko comes in today with chief complaint of Medical Management of Chronic Issues   Diagnosis and orders addressed:  1. Primary  hypertension Low sodium diet - CBC with Differential/Platelet - CMP14+EGFR - amLODipine (NORVASC) 10 MG tablet; Take 1 tablet (10 mg total) by mouth daily.  Dispense: 90 tablet; Refill: 1 - benazepril (LOTENSIN) 40 MG tablet; Take 1 tablet (40 mg total) by mouth daily.  Dispense: 90 tablet; Refill: 1 - hydrochlorothiazide (HYDRODIURIL) 25 MG tablet;  Take 1 tablet (25 mg total) by mouth daily.  Dispense: 90 tablet; Refill: 1  2. Hyperlipidemia associated with type 2 diabetes mellitus (HCC) Low fat diet - Lipid panel - rosuvastatin (CRESTOR) 10 MG tablet; Take 1 tablet (10 mg total) by mouth daily.  Dispense: 90 tablet; Refill: 1  3. Type 2 diabetes mellitus without complication, without long-term current use of insulin (HCC) Continue to watch carbs in diet - Bayer DCA Hb A1c Waived - Microalbumin / creatinine urine ratio - metFORMIN (GLUCOPHAGE) 1000 MG tablet; Take 1 tablet (1,000 mg total) by mouth 2 (two) times daily with a meal. (NEEDS TO BE SEEN BEFORE NEXT REFILL)  Dispense: 180 tablet; Refill: 1  4. Gastroesophageal reflux disease without esophagitis Avoid spicy foods Do not eat 2 hours prior to bedtime - omeprazole (PRILOSEC) 40 MG capsule; Take 1 capsule (40 mg total) by mouth daily.  Dispense: 90 capsule; Refill: 1  5. BMI 29.0-29.9,adult Discussed diet and exercise for person with BMI >25 Will recheck weight in 3-6 months   6. Fatigue, unspecified type Labs pending - DG Chest 2 View - EKG 12-Lead - Vitamin B12 - VITAMIN D 25 Hydroxy (Vit-D Deficiency, Fractures) - Anemia Profile B   Labs pending Health Maintenance reviewed Diet and exercise encouraged  Follow up plan: 6 months   Mary-Margaret Daphine Deutscher, FNP

## 2022-12-13 LAB — ANEMIA PROFILE A

## 2022-12-14 LAB — ANEMIA PROFILE A
Basophils Absolute: 0 10*3/uL (ref 0.0–0.2)
Basos: 0 %
EOS (ABSOLUTE): 0.2 10*3/uL (ref 0.0–0.4)
Eos: 2 %
Hemoglobin: 12.9 g/dL — ABNORMAL LOW (ref 13.0–17.7)
Lymphocytes Absolute: 1.7 10*3/uL (ref 0.7–3.1)
MCHC: 33.8 g/dL (ref 31.5–35.7)
MCV: 83 fL (ref 79–97)

## 2022-12-14 LAB — VITAMIN D 25 HYDROXY (VIT D DEFICIENCY, FRACTURES)

## 2022-12-15 LAB — ANEMIA PROFILE A
Immature Granulocytes: 0 %
MCH: 28.2 pg (ref 26.6–33.0)
Monocytes Absolute: 1.1 10*3/uL — ABNORMAL HIGH (ref 0.1–0.9)
Neutrophils: 73 %
Platelets: 301 10*3/uL (ref 150–450)
RDW: 13 % (ref 11.6–15.4)

## 2022-12-15 LAB — VITAMIN B12

## 2022-12-16 LAB — ANEMIA PROFILE A
Hematocrit: 38.2 % (ref 37.5–51.0)
Lymphs: 15 %
Monocytes: 10 %
Neutrophils Absolute: 8 10*3/uL — ABNORMAL HIGH (ref 1.4–7.0)
RBC: 4.58 x10E6/uL (ref 4.14–5.80)
Retic Ct Pct: 1.2 % (ref 0.6–2.6)
WBC: 11 10*3/uL — ABNORMAL HIGH (ref 3.4–10.8)

## 2022-12-16 LAB — SPECIMEN STATUS REPORT

## 2022-12-17 ENCOUNTER — Other Ambulatory Visit: Payer: Self-pay

## 2022-12-17 DIAGNOSIS — R7989 Other specified abnormal findings of blood chemistry: Secondary | ICD-10-CM

## 2022-12-17 MED ORDER — CYANOCOBALAMIN 1000 MCG/ML IJ SOLN
1000.0000 ug | INTRAMUSCULAR | Status: AC
Start: 2022-12-18 — End: 2023-01-08
  Administered 2022-12-18 – 2023-01-08 (×4): 1000 ug via INTRAMUSCULAR

## 2022-12-18 ENCOUNTER — Ambulatory Visit (INDEPENDENT_AMBULATORY_CARE_PROVIDER_SITE_OTHER): Payer: Medicare HMO

## 2022-12-18 DIAGNOSIS — E538 Deficiency of other specified B group vitamins: Secondary | ICD-10-CM | POA: Diagnosis not present

## 2022-12-18 DIAGNOSIS — R7989 Other specified abnormal findings of blood chemistry: Secondary | ICD-10-CM

## 2022-12-18 NOTE — Progress Notes (Signed)
Cyanocobalamin injection given to left deltoid.  Patient tolerated well. 

## 2022-12-25 ENCOUNTER — Ambulatory Visit (INDEPENDENT_AMBULATORY_CARE_PROVIDER_SITE_OTHER): Payer: Medicare HMO

## 2022-12-25 DIAGNOSIS — E538 Deficiency of other specified B group vitamins: Secondary | ICD-10-CM | POA: Diagnosis not present

## 2022-12-25 DIAGNOSIS — R7989 Other specified abnormal findings of blood chemistry: Secondary | ICD-10-CM

## 2022-12-25 NOTE — Progress Notes (Signed)
B12 given to patient and tolerated well. 

## 2023-01-01 ENCOUNTER — Ambulatory Visit (INDEPENDENT_AMBULATORY_CARE_PROVIDER_SITE_OTHER): Payer: Medicare HMO

## 2023-01-01 DIAGNOSIS — R7989 Other specified abnormal findings of blood chemistry: Secondary | ICD-10-CM | POA: Diagnosis not present

## 2023-01-08 ENCOUNTER — Ambulatory Visit (INDEPENDENT_AMBULATORY_CARE_PROVIDER_SITE_OTHER): Payer: Medicare HMO

## 2023-01-08 DIAGNOSIS — B351 Tinea unguium: Secondary | ICD-10-CM | POA: Diagnosis not present

## 2023-01-08 DIAGNOSIS — E1142 Type 2 diabetes mellitus with diabetic polyneuropathy: Secondary | ICD-10-CM | POA: Diagnosis not present

## 2023-01-08 DIAGNOSIS — M79676 Pain in unspecified toe(s): Secondary | ICD-10-CM | POA: Diagnosis not present

## 2023-01-08 DIAGNOSIS — R7989 Other specified abnormal findings of blood chemistry: Secondary | ICD-10-CM

## 2023-01-08 DIAGNOSIS — E538 Deficiency of other specified B group vitamins: Secondary | ICD-10-CM | POA: Diagnosis not present

## 2023-01-08 DIAGNOSIS — L603 Nail dystrophy: Secondary | ICD-10-CM | POA: Diagnosis not present

## 2023-01-08 DIAGNOSIS — L84 Corns and callosities: Secondary | ICD-10-CM | POA: Diagnosis not present

## 2023-01-08 NOTE — Progress Notes (Signed)
Cyanocobalamin injection given to right deltoid.  Patient tolerated well. 

## 2023-01-23 ENCOUNTER — Ambulatory Visit (INDEPENDENT_AMBULATORY_CARE_PROVIDER_SITE_OTHER): Payer: Medicare HMO | Admitting: Family Medicine

## 2023-01-23 DIAGNOSIS — R7989 Other specified abnormal findings of blood chemistry: Secondary | ICD-10-CM

## 2023-01-23 DIAGNOSIS — E538 Deficiency of other specified B group vitamins: Secondary | ICD-10-CM | POA: Diagnosis not present

## 2023-01-23 MED ORDER — CYANOCOBALAMIN 1000 MCG/ML IJ SOLN
1000.0000 ug | Freq: Once | INTRAMUSCULAR | Status: AC
Start: 2023-01-23 — End: 2023-01-23
  Administered 2023-01-23: 1000 ug via INTRAMUSCULAR

## 2023-02-02 ENCOUNTER — Ambulatory Visit (INDEPENDENT_AMBULATORY_CARE_PROVIDER_SITE_OTHER): Payer: Medicare HMO | Admitting: Nurse Practitioner

## 2023-02-02 ENCOUNTER — Ambulatory Visit (INDEPENDENT_AMBULATORY_CARE_PROVIDER_SITE_OTHER): Payer: Medicare HMO

## 2023-02-02 VITALS — BP 94/59 | HR 100 | Temp 97.0°F | Resp 20 | Ht 73.0 in | Wt 199.0 lb

## 2023-02-02 DIAGNOSIS — K5901 Slow transit constipation: Secondary | ICD-10-CM

## 2023-02-02 DIAGNOSIS — R109 Unspecified abdominal pain: Secondary | ICD-10-CM | POA: Diagnosis not present

## 2023-02-02 DIAGNOSIS — N2889 Other specified disorders of kidney and ureter: Secondary | ICD-10-CM | POA: Diagnosis not present

## 2023-02-02 NOTE — Patient Instructions (Signed)

## 2023-02-02 NOTE — Progress Notes (Signed)
   Subjective:    Patient ID: Jorge Garcia, male    DOB: Oct 15, 1945, 77 y.o.   MRN: [id number]   Chief Complaint: Right side pain   Patient says he just does not fel good. Having right upper flank pain on right side. Worse when he takes a ddep breathe. Rates pain 5/810 currently. Eating has no affect on pain. He has not taken anything for it. Has been constipated  and drank mag citrate. Has been belching a lot.     Patient Active Problem List   Diagnosis Date Noted   Encounter for screening for lung cancer 10/28/2019   BMI 29.0-29.9,adult 07/19/2015   Gastroesophageal reflux disease without esophagitis 12/22/2014   Type 2 diabetes mellitus without complication (HCC) 06/12/2014   Erectile dysfunction associated with type 2 diabetes mellitus (HCC) 03/17/2014   Hypertension 11/14/2010   Hyperlipidemia associated with type 2 diabetes mellitus (HCC) 11/14/2010       Review of Systems  Constitutional: Negative.   Respiratory: Negative.    Gastrointestinal:  Positive for abdominal pain, constipation and nausea. Negative for vomiting.  Genitourinary: Negative.   Neurological: Negative.   Psychiatric/Behavioral: Negative.         Objective:   Physical Exam Vitals reviewed.  Constitutional:      Appearance: Normal appearance.  Cardiovascular:     Rate and Rhythm: Normal rate and regular rhythm.  Pulmonary:     Breath sounds: Normal breath sounds.  Abdominal:     General: There is distension.     Tenderness: There is abdominal tenderness (mild diffuse). There is no guarding.  Neurological:     General: No focal deficit present.     Mental Status: He is alert and oriented to person, place, and time.  Psychiatric:        Mood and Affect: Mood normal.        Behavior: Behavior normal.    BP (!) 94/59   Pulse 100   Temp (!) 97 F (36.1 C) (Temporal)   Resp 20   Ht 6\' 1"  (1.854 m)   Wt 199 lb (90.3 kg)   SpO2 96%   BMI 26.25 kg/m    KUB- moderate amount of stool  burden-Preliminary reading by Paulene Floor, FNP  [facility]      Assessment & Plan:  Julaine Hua in today with chief complaint of Right side pain   1. Flank pain - DG Abd 1 View  2. Slow transit constipation Milk of magnesia and 6 oz of prune juice- repeat in 6 hours if no results Start miralax in apple juice daily to every other day. Increase vegetables and fiber in diet Force fluids RTO prn    The above assessment and management plan was discussed with the patient. The patient verbalized understanding of and has agreed to the management plan. Patient is aware to call the clinic if symptoms persist or worsen. Patient is aware when to return to the clinic for a follow-up visit. Patient educated on when it is appropriate to go to the emergency department.   Mary-Margaret Daphine Deutscher, FNP  s

## 2023-02-06 ENCOUNTER — Ambulatory Visit (INDEPENDENT_AMBULATORY_CARE_PROVIDER_SITE_OTHER): Payer: Medicare HMO

## 2023-02-06 DIAGNOSIS — R7989 Other specified abnormal findings of blood chemistry: Secondary | ICD-10-CM

## 2023-02-06 NOTE — Progress Notes (Signed)
Cyanocobalamin injection to right deltoid, patient tolerated well.

## 2023-02-19 ENCOUNTER — Ambulatory Visit: Payer: Medicare HMO

## 2023-02-19 DIAGNOSIS — E538 Deficiency of other specified B group vitamins: Secondary | ICD-10-CM

## 2023-02-19 DIAGNOSIS — R7989 Other specified abnormal findings of blood chemistry: Secondary | ICD-10-CM

## 2023-02-19 MED ORDER — CYANOCOBALAMIN 1000 MCG/ML IJ SOLN
1000.0000 ug | Freq: Once | INTRAMUSCULAR | Status: AC
Start: 2023-02-19 — End: 2023-02-19
  Administered 2023-02-19: 1000 ug via INTRAMUSCULAR

## 2023-02-19 NOTE — Progress Notes (Signed)
B12 injection given lt deltoid - patient tolerated well

## 2023-03-05 ENCOUNTER — Ambulatory Visit (INDEPENDENT_AMBULATORY_CARE_PROVIDER_SITE_OTHER): Payer: Medicare HMO

## 2023-03-05 DIAGNOSIS — R7989 Other specified abnormal findings of blood chemistry: Secondary | ICD-10-CM | POA: Diagnosis not present

## 2023-03-05 MED ORDER — CYANOCOBALAMIN 1000 MCG/ML IJ SOLN
1000.0000 ug | Freq: Once | INTRAMUSCULAR | Status: AC
Start: 2023-03-05 — End: 2023-03-05
  Administered 2023-03-05: 1000 ug via INTRAMUSCULAR

## 2023-03-05 NOTE — Progress Notes (Signed)
Cyanocobalamin injection given to right deltoid.  Patient tolerated well. 

## 2023-03-12 DIAGNOSIS — L84 Corns and callosities: Secondary | ICD-10-CM | POA: Diagnosis not present

## 2023-03-12 DIAGNOSIS — M79676 Pain in unspecified toe(s): Secondary | ICD-10-CM | POA: Diagnosis not present

## 2023-03-12 DIAGNOSIS — E1142 Type 2 diabetes mellitus with diabetic polyneuropathy: Secondary | ICD-10-CM | POA: Diagnosis not present

## 2023-03-12 DIAGNOSIS — L603 Nail dystrophy: Secondary | ICD-10-CM | POA: Diagnosis not present

## 2023-04-06 ENCOUNTER — Ambulatory Visit (INDEPENDENT_AMBULATORY_CARE_PROVIDER_SITE_OTHER): Payer: Medicare HMO

## 2023-04-06 DIAGNOSIS — E291 Testicular hypofunction: Secondary | ICD-10-CM

## 2023-04-06 DIAGNOSIS — R7989 Other specified abnormal findings of blood chemistry: Secondary | ICD-10-CM

## 2023-04-06 MED ORDER — CYANOCOBALAMIN 1000 MCG/ML IJ SOLN
1000.0000 ug | INTRAMUSCULAR | Status: AC
Start: 2023-04-06 — End: 2024-03-31
  Administered 2023-04-06 – 2024-03-17 (×11): 1000 ug via INTRAMUSCULAR

## 2023-04-06 NOTE — Progress Notes (Signed)
Cyanocobalamin injection given to left deltoid.  Patient tolerated well. 

## 2023-05-07 ENCOUNTER — Ambulatory Visit (INDEPENDENT_AMBULATORY_CARE_PROVIDER_SITE_OTHER): Payer: Medicare HMO | Admitting: *Deleted

## 2023-05-07 DIAGNOSIS — Z23 Encounter for immunization: Secondary | ICD-10-CM | POA: Diagnosis not present

## 2023-05-07 DIAGNOSIS — E538 Deficiency of other specified B group vitamins: Secondary | ICD-10-CM

## 2023-05-07 NOTE — Progress Notes (Signed)
Patient in today for monthly B 12 injection. Tolerated well. Patient also received flu vaccine and tolerated well.

## 2023-05-21 DIAGNOSIS — B351 Tinea unguium: Secondary | ICD-10-CM | POA: Diagnosis not present

## 2023-05-21 DIAGNOSIS — L84 Corns and callosities: Secondary | ICD-10-CM | POA: Diagnosis not present

## 2023-05-21 DIAGNOSIS — E1142 Type 2 diabetes mellitus with diabetic polyneuropathy: Secondary | ICD-10-CM | POA: Diagnosis not present

## 2023-05-21 DIAGNOSIS — M79676 Pain in unspecified toe(s): Secondary | ICD-10-CM | POA: Diagnosis not present

## 2023-06-05 ENCOUNTER — Ambulatory Visit (INDEPENDENT_AMBULATORY_CARE_PROVIDER_SITE_OTHER): Payer: Medicare HMO

## 2023-06-05 DIAGNOSIS — E538 Deficiency of other specified B group vitamins: Secondary | ICD-10-CM

## 2023-06-05 DIAGNOSIS — R7989 Other specified abnormal findings of blood chemistry: Secondary | ICD-10-CM

## 2023-06-05 NOTE — Progress Notes (Signed)
Cyanocobalamin injection given to left deltoid.  Patient tolerated well. 

## 2023-06-12 ENCOUNTER — Ambulatory Visit (INDEPENDENT_AMBULATORY_CARE_PROVIDER_SITE_OTHER): Payer: Medicare HMO | Admitting: Nurse Practitioner

## 2023-06-12 ENCOUNTER — Encounter: Payer: Self-pay | Admitting: Nurse Practitioner

## 2023-06-12 VITALS — BP 123/72 | HR 63 | Temp 97.6°F | Resp 20 | Ht 73.0 in | Wt 208.0 lb

## 2023-06-12 DIAGNOSIS — Z7984 Long term (current) use of oral hypoglycemic drugs: Secondary | ICD-10-CM | POA: Diagnosis not present

## 2023-06-12 DIAGNOSIS — K219 Gastro-esophageal reflux disease without esophagitis: Secondary | ICD-10-CM | POA: Diagnosis not present

## 2023-06-12 DIAGNOSIS — E785 Hyperlipidemia, unspecified: Secondary | ICD-10-CM | POA: Diagnosis not present

## 2023-06-12 DIAGNOSIS — E119 Type 2 diabetes mellitus without complications: Secondary | ICD-10-CM

## 2023-06-12 DIAGNOSIS — Z6829 Body mass index (BMI) 29.0-29.9, adult: Secondary | ICD-10-CM | POA: Diagnosis not present

## 2023-06-12 DIAGNOSIS — E1169 Type 2 diabetes mellitus with other specified complication: Secondary | ICD-10-CM

## 2023-06-12 DIAGNOSIS — I10 Essential (primary) hypertension: Secondary | ICD-10-CM

## 2023-06-12 LAB — LIPID PANEL
Chol/HDL Ratio: 2.3 ratio (ref 0.0–5.0)
Cholesterol, Total: 86 mg/dL — ABNORMAL LOW (ref 100–199)
HDL: 37 mg/dL — ABNORMAL LOW (ref >39)
LDL Chol Calc (NIH): 33 mg/dL (ref 0–99)
Triglycerides: 79 mg/dL (ref 0–149)
VLDL Cholesterol Cal: 16 mg/dL (ref 5–40)

## 2023-06-12 LAB — CBC WITH DIFFERENTIAL/PLATELET
Basophils Absolute: 0.1 10*3/uL (ref 0.0–0.2)
Basos: 1 %
EOS (ABSOLUTE): 0.5 10*3/uL — ABNORMAL HIGH (ref 0.0–0.4)
Eos: 6 %
Hematocrit: 40.8 % (ref 37.5–51.0)
Hemoglobin: 13 g/dL (ref 13.0–17.7)
Immature Grans (Abs): 0 10*3/uL (ref 0.0–0.1)
Immature Granulocytes: 0 %
Lymphocytes Absolute: 2.2 10*3/uL (ref 0.7–3.1)
Lymphs: 24 %
MCH: 27.4 pg (ref 26.6–33.0)
MCHC: 31.9 g/dL (ref 31.5–35.7)
MCV: 86 fL (ref 79–97)
Monocytes Absolute: 0.8 10*3/uL (ref 0.1–0.9)
Monocytes: 8 %
Neutrophils Absolute: 5.9 10*3/uL (ref 1.4–7.0)
Neutrophils: 61 %
Platelets: 262 10*3/uL (ref 150–450)
RBC: 4.75 x10E6/uL (ref 4.14–5.80)
RDW: 14 % (ref 11.6–15.4)
WBC: 9.6 10*3/uL (ref 3.4–10.8)

## 2023-06-12 LAB — CMP14+EGFR
ALT: 14 [IU]/L (ref 0–44)
AST: 17 [IU]/L (ref 0–40)
Albumin: 4.2 g/dL (ref 3.8–4.8)
Alkaline Phosphatase: 84 [IU]/L (ref 44–121)
BUN/Creatinine Ratio: 16 (ref 10–24)
BUN: 14 mg/dL (ref 8–27)
Bilirubin Total: 1 mg/dL (ref 0.0–1.2)
CO2: 24 mmol/L (ref 20–29)
Calcium: 9.5 mg/dL (ref 8.6–10.2)
Chloride: 103 mmol/L (ref 96–106)
Creatinine, Ser: 0.86 mg/dL (ref 0.76–1.27)
Globulin, Total: 2.5 g/dL (ref 1.5–4.5)
Glucose: 139 mg/dL — ABNORMAL HIGH (ref 70–99)
Potassium: 4.2 mmol/L (ref 3.5–5.2)
Sodium: 140 mmol/L (ref 134–144)
Total Protein: 6.7 g/dL (ref 6.0–8.5)
eGFR: 90 mL/min/{1.73_m2} (ref >59)

## 2023-06-12 LAB — BAYER DCA HB A1C WAIVED: HB A1C (BAYER DCA - WAIVED): 7.2 % — ABNORMAL HIGH (ref 4.8–5.6)

## 2023-06-12 MED ORDER — HYDROCHLOROTHIAZIDE 25 MG PO TABS: 25.0000 mg | ORAL_TABLET | Freq: Every day | ORAL | 1 refills | Status: DC

## 2023-06-12 MED ORDER — METFORMIN HCL 1000 MG PO TABS: 1000.0000 mg | ORAL_TABLET | Freq: Two times a day (BID) | ORAL | 1 refills | Status: DC

## 2023-06-12 MED ORDER — OMEPRAZOLE 40 MG PO CPDR: 40.0000 mg | DELAYED_RELEASE_CAPSULE | Freq: Every day | ORAL | 1 refills | Status: DC

## 2023-06-12 MED ORDER — BENAZEPRIL HCL 40 MG PO TABS: 40.0000 mg | ORAL_TABLET | Freq: Every day | ORAL | 1 refills | Status: DC

## 2023-06-12 MED ORDER — ROSUVASTATIN CALCIUM 10 MG PO TABS: 10.0000 mg | ORAL_TABLET | Freq: Every day | ORAL | 1 refills | Status: DC

## 2023-06-12 MED ORDER — AMLODIPINE BESYLATE 10 MG PO TABS: 10.0000 mg | ORAL_TABLET | Freq: Every day | ORAL | 1 refills | Status: DC

## 2023-06-12 NOTE — Progress Notes (Signed)
Subjective:    Patient ID: Jorge Garcia, male    DOB: 05-16-1946, 77 y.o.   MRN: [id number]   Chief Complaint: medical management of chronic issues     HPI:  Jorge Garcia is a 77 y.o. who identifies as a male who was assigned male at birth.   Social history: Lives with: by hisself Work history: retired   Water engineer in today for follow up of the following chronic medical issues:  1. Primary hypertension No c/o chest pain, sob or headache. Does not check blood pressure at home. BP Readings from Last 3 Encounters:  02/02/23 (!) 94/59  12/11/22 127/69  06/06/22 120/68     2. Diabetes mellitus treated with oral medication (HCC) Very seldom checks his blood sugars Lab Results  Component Value Date   HGBA1C 6.9 (H) 12/11/2022     3. Hyperlipidemia associated with type 2 diabetes mellitus (HCC) Does not watch diet and does no exercise. Lab Results  Component Value Date   CHOL 111 12/11/2022   HDL 32 (L) 12/11/2022   LDLCALC 58 12/11/2022   TRIG 114 12/11/2022   CHOLHDL 3.5 12/11/2022     4. Gastroesophageal reflux disease without esophagitis Is on omperazole and is doing well.  5. BMI 29.0-29.9,adult Weight is up 9lbs Wt Readings from Last 3 Encounters:  06/12/23 208 lb (94.3 kg)  02/02/23 199 lb (90.3 kg)  12/11/22 210 lb (95.3 kg)   BMI Readings from Last 3 Encounters:  06/12/23 27.44 kg/m  02/02/23 26.25 kg/m  12/11/22 27.71 kg/m      New complaints: None today  No Known Allergies Outpatient Encounter Medications as of 06/12/2023  Medication Sig   Accu-Chek Softclix Lancets lancets USE 1 LANCET TO CHECK GLUCOSE 4 TIMES DAILY   amLODipine (NORVASC) 10 MG tablet Take 1 tablet (10 mg total) by mouth daily.   aspirin EC 81 MG tablet Take 81 mg by mouth daily.   benazepril (LOTENSIN) 40 MG tablet Take 1 tablet (40 mg total) by mouth daily.   cholecalciferol (VITAMIN D) 1000 UNITS tablet Take 2,000 Units by mouth daily.    fish oil-omega-3 fatty  acids 1000 MG capsule Take 1 g by mouth daily.   glucose blood (ACCU-CHEK AVIVA PLUS) test strip CHECK GLUCOSE 4 TIMES DAILY Dx E11.9   hydrochlorothiazide (HYDRODIURIL) 25 MG tablet Take 1 tablet (25 mg total) by mouth daily.   ibuprofen (ADVIL) 400 MG tablet Take 1 tablet (400 mg total) by mouth every 6 (six) hours as needed.   metFORMIN (GLUCOPHAGE) 1000 MG tablet Take 1 tablet (1,000 mg total) by mouth 2 (two) times daily with a meal. (NEEDS TO BE SEEN BEFORE NEXT REFILL)   omeprazole (PRILOSEC) 40 MG capsule Take 1 capsule (40 mg total) by mouth daily.   rosuvastatin (CRESTOR) 10 MG tablet Take 1 tablet (10 mg total) by mouth daily.   Facility-Administered Encounter Medications as of 06/12/2023  Medication   cyanocobalamin (VITAMIN B12) injection 1,000 mcg    Past Surgical History:  Procedure Laterality Date   CATARACT EXTRACTION, BILATERAL Bilateral    COLONOSCOPY  12/01/2013   COLONOSCOPY  2020   MS-MAC-suprep(good)-tics/hems/HPPx1/TAx4   COLONOSCOPY  04/22/2022   HEMORRHOID SURGERY  1978   POLYPECTOMY  2020   HPPx1/TAx4   UPPER GASTROINTESTINAL ENDOSCOPY     WISDOM TOOTH EXTRACTION      Family History  Problem Relation Age of Onset   Diabetes Mother    Hypertension Mother    Stroke Mother 47  Diabetes Father    Hypertension Father    Heart attack Father 15   Colon cancer Neg Hx    Colon polyps Neg Hx    Esophageal cancer Neg Hx    Stomach cancer Neg Hx    Rectal cancer Neg Hx       Controlled substance contract: n/a     Review of Systems  Constitutional:  Negative for diaphoresis.  Eyes:  Negative for pain.  Respiratory:  Negative for shortness of breath.   Cardiovascular:  Negative for chest pain, palpitations and leg swelling.  Gastrointestinal:  Negative for abdominal pain.  Endocrine: Negative for polydipsia.  Skin:  Negative for rash.  Neurological:  Negative for dizziness, weakness and headaches.  Hematological:  Does not bruise/bleed easily.   All other systems reviewed and are negative.      Objective:   Physical Exam Vitals and nursing note reviewed.  Constitutional:      Appearance: Normal appearance. He is well-developed.  HENT:     Head: Normocephalic.     Nose: Nose normal.     Mouth/Throat:     Mouth: Mucous membranes are moist.     Pharynx: Oropharynx is clear.  Eyes:     Pupils: Pupils are equal, round, and reactive to light.  Neck:     Thyroid: No thyroid mass or thyromegaly.     Vascular: No carotid bruit or JVD.     Trachea: Phonation normal.  Cardiovascular:     Rate and Rhythm: Normal rate and regular rhythm.  Pulmonary:     Effort: Pulmonary effort is normal. No respiratory distress.     Breath sounds: Normal breath sounds.  Abdominal:     General: Bowel sounds are normal.     Palpations: Abdomen is soft.     Tenderness: There is no abdominal tenderness.  Musculoskeletal:        General: Normal range of motion.     Cervical back: Normal range of motion and neck supple.  Lymphadenopathy:     Cervical: No cervical adenopathy.  Skin:    General: Skin is warm and dry.  Neurological:     Mental Status: He is alert and oriented to person, place, and time.  Psychiatric:        Behavior: Behavior normal.        Thought Content: Thought content normal.        Judgment: Judgment normal.    BP 123/72   Pulse 63   Temp 97.6 F (36.4 C) (Temporal)   Resp 20   Ht 6\' 1"  (1.854 m)   Wt 208 lb (94.3 kg)   SpO2 95%   BMI 27.44 kg/m    Hgba1c 7.2%     Assessment & Plan:   Jorge Garcia comes in today with chief complaint of Medical Management of Chronic Issues   Diagnosis and orders addressed:  1. Primary hypertension Low sodium diet - CBC with Differential/Platelet - CMP14+EGFR - amLODipine (NORVASC) 10 MG tablet; Take 1 tablet (10 mg total) by mouth daily.  Dispense: 90 tablet; Refill: 1 - benazepril (LOTENSIN) 40 MG tablet; Take 1 tablet (40 mg total) by mouth daily.  Dispense: 90  tablet; Refill: 1 - hydrochlorothiazide (HYDRODIURIL) 25 MG tablet; Take 1 tablet (25 mg total) by mouth daily.  Dispense: 90 tablet; Refill: 1  2. Diabetes mellitus treated with oral medication (HCC) Continue to watch carbs in diet - Bayer DCA Hb A1c Waived - Microalbumin / creatinine urine ratio - metFORMIN (GLUCOPHAGE)  1000 MG tablet; Take 1 tablet (1,000 mg total) by mouth 2 (two) times daily with a meal. (NEEDS TO BE SEEN BEFORE NEXT REFILL)  Dispense: 180 tablet; Refill: 1  3. Hyperlipidemia associated with type 2 diabetes mellitus (HCC) Low fat diet - Lipid panel - rosuvastatin (CRESTOR) 10 MG tablet; Take 1 tablet (10 mg total) by mouth daily.  Dispense: 90 tablet; Refill: 1  4. Gastroesophageal reflux disease without esophagitis Avoid spicy foods Do not eat 2 hours prior to bedtime - omeprazole (PRILOSEC) 40 MG capsule; Take 1 capsule (40 mg total) by mouth daily.  Dispense: 90 capsule; Refill: 1  5. BMI 29.0-29.9,adult Discussed diet and exercise for person with BMI >25 Will recheck weight in 3-6 months    Labs pending Health Maintenance reviewed Diet and exercise encouraged  Follow up plan: 6 months   Mary-Margaret Daphine Deutscher, FNP

## 2023-06-14 LAB — MICROALBUMIN / CREATININE URINE RATIO
Creatinine, Urine: 140.3 mg/dL
Microalb/Creat Ratio: 7 mg/g{creat} (ref 0–29)
Microalbumin, Urine: 9.5 ug/mL

## 2023-06-30 ENCOUNTER — Ambulatory Visit: Payer: Medicare HMO

## 2023-07-06 ENCOUNTER — Ambulatory Visit: Payer: Medicare HMO

## 2023-07-07 ENCOUNTER — Ambulatory Visit (INDEPENDENT_AMBULATORY_CARE_PROVIDER_SITE_OTHER): Payer: Medicare HMO

## 2023-07-07 DIAGNOSIS — R7989 Other specified abnormal findings of blood chemistry: Secondary | ICD-10-CM

## 2023-07-07 DIAGNOSIS — Z7984 Long term (current) use of oral hypoglycemic drugs: Secondary | ICD-10-CM | POA: Diagnosis not present

## 2023-07-07 DIAGNOSIS — E119 Type 2 diabetes mellitus without complications: Secondary | ICD-10-CM

## 2023-07-07 LAB — HM DIABETES EYE EXAM

## 2023-07-07 NOTE — Progress Notes (Signed)
Cyanocobalamin injection given to right deltoid.  Patient tolerated well.

## 2023-07-07 NOTE — Progress Notes (Signed)
Jorge Garcia arrived 07/07/2023 and has given verbal consent to obtain images and complete their overdue diabetic retinal screening.  The images have been sent to an ophthalmologist or optometrist for review and interpretation.  Results will be sent back to Bennie Pierini, FNP for review.  Patient has been informed they will be contacted when we receive the results via telephone or MyChart

## 2023-07-30 DIAGNOSIS — M79676 Pain in unspecified toe(s): Secondary | ICD-10-CM | POA: Diagnosis not present

## 2023-07-30 DIAGNOSIS — B351 Tinea unguium: Secondary | ICD-10-CM | POA: Diagnosis not present

## 2023-07-30 DIAGNOSIS — E1142 Type 2 diabetes mellitus with diabetic polyneuropathy: Secondary | ICD-10-CM | POA: Diagnosis not present

## 2023-07-30 DIAGNOSIS — L84 Corns and callosities: Secondary | ICD-10-CM | POA: Diagnosis not present

## 2023-08-06 ENCOUNTER — Ambulatory Visit (INDEPENDENT_AMBULATORY_CARE_PROVIDER_SITE_OTHER): Payer: Medicare HMO | Admitting: *Deleted

## 2023-08-06 DIAGNOSIS — R7989 Other specified abnormal findings of blood chemistry: Secondary | ICD-10-CM | POA: Diagnosis not present

## 2023-08-06 NOTE — Progress Notes (Signed)
Pt given B12 injection IM left deltoid and tolerated well. °

## 2023-09-07 ENCOUNTER — Ambulatory Visit: Payer: Medicare HMO

## 2023-09-07 ENCOUNTER — Ambulatory Visit (INDEPENDENT_AMBULATORY_CARE_PROVIDER_SITE_OTHER): Payer: Medicare HMO

## 2023-09-07 VITALS — Ht 73.0 in | Wt 208.0 lb

## 2023-09-07 DIAGNOSIS — Z0000 Encounter for general adult medical examination without abnormal findings: Secondary | ICD-10-CM

## 2023-09-07 DIAGNOSIS — R7989 Other specified abnormal findings of blood chemistry: Secondary | ICD-10-CM | POA: Diagnosis not present

## 2023-09-07 NOTE — Progress Notes (Signed)
Subjective:   Jorge Garcia is a 78 y.o. male who presents for Medicare Annual/Subsequent preventive examination.  Visit Complete: Virtual I connected with  Jorge Garcia on 09/07/23 by a audio enabled telemedicine application and verified that I am speaking with the correct person using two identifiers.  Patient Location: Home  Provider Location: Home Office  This patient declined Interactive audio and video telecommunications. Therefore the visit was completed with audio only.  I discussed the limitations of evaluation and management by telemedicine. The patient expressed understanding and agreed to proceed.  Vital Signs: Because this visit was a virtual/telehealth visit, some criteria may be missing or patient reported. Any vitals not documented were not able to be obtained and vitals that have been documented are patient reported.  Cardiac Risk Factors include: advanced age (>44men, >24 women);diabetes mellitus;dyslipidemia;hypertension;male gender     Objective:    Today's Vitals   09/07/23 0849  Weight: 208 lb (94.3 kg)  Height: 6\' 1"  (1.854 m)   Body mass index is 27.44 kg/m.     09/07/2023    8:54 AM 09/01/2022    8:56 AM 05/28/2021    2:57 PM 01/31/2019   11:04 AM 12/06/2018    6:12 PM 01/27/2018   10:59 AM 01/26/2017   10:36 AM  Advanced Directives  Does Patient Have a Medical Advance Directive? No Yes Yes Yes Yes Yes Yes  Type of Advance Directive  Living will;Healthcare Power of [employer] Power of [place];Living will Living will Living will Healthcare Power of [place];Living will Healthcare Power of [place];Living will  Does patient want to make changes to medical advance directive?  No - Patient declined  No - Patient declined No - Patient declined No - Patient declined   Copy of Healthcare Power of Attorney in Chart?  No - copy requested No - copy requested   No - copy requested No - copy requested  Would patient like information on creating a  medical advance directive? Yes (MAU/Ambulatory/Procedural Areas - Information given)          Current Medications (verified) Outpatient Encounter Medications as of 09/07/2023  Medication Sig   Accu-Chek Softclix Lancets lancets USE 1 LANCET TO CHECK GLUCOSE 4 TIMES DAILY   amLODipine (NORVASC) 10 MG tablet Take 1 tablet (10 mg total) by mouth daily.   aspirin EC 81 MG tablet Take 81 mg by mouth daily.   benazepril (LOTENSIN) 40 MG tablet Take 1 tablet (40 mg total) by mouth daily.   cholecalciferol (VITAMIN D) 1000 UNITS tablet Take 2,000 Units by mouth daily.    fish oil-omega-3 fatty acids 1000 MG capsule Take 1 g by mouth daily.   glucose blood (ACCU-CHEK AVIVA PLUS) test strip CHECK GLUCOSE 4 TIMES DAILY Dx E11.9   hydrochlorothiazide (HYDRODIURIL) 25 MG tablet Take 1 tablet (25 mg total) by mouth daily.   ibuprofen (ADVIL) 400 MG tablet Take 1 tablet (400 mg total) by mouth every 6 (six) hours as needed.   metFORMIN (GLUCOPHAGE) 1000 MG tablet Take 1 tablet (1,000 mg total) by mouth 2 (two) times daily with a meal. (NEEDS TO BE SEEN BEFORE NEXT REFILL)   omeprazole (PRILOSEC) 40 MG capsule Take 1 capsule (40 mg total) by mouth daily.   rosuvastatin (CRESTOR) 10 MG tablet Take 1 tablet (10 mg total) by mouth daily.   Facility-Administered Encounter Medications as of 09/07/2023  Medication   cyanocobalamin (VITAMIN B12) injection 1,000 mcg    Allergies (verified) Patient has no known allergies.   History:  Past Medical History:  Diagnosis Date   Arthritis    back   Cataract    bilateral sx   Diabetes mellitus without complication (HCC)    on meds   GERD (gastroesophageal reflux disease)    on meds   Hyperlipidemia    on meds   Hypertension    on meds   Internal hemorrhoids    Tubulovillous adenoma of colon 2009   Vitamin D deficiency    Past Surgical History:  Procedure Laterality Date   CATARACT EXTRACTION, BILATERAL Bilateral    COLONOSCOPY  12/01/2013    COLONOSCOPY  2020   MS-MAC-suprep(good)-tics/hems/HPPx1/TAx4   COLONOSCOPY  04/22/2022   HEMORRHOID SURGERY  1978   POLYPECTOMY  2020   HPPx1/TAx4   UPPER GASTROINTESTINAL ENDOSCOPY     WISDOM TOOTH EXTRACTION     Family History  Problem Relation Age of Onset   Diabetes Mother    Hypertension Mother    Stroke Mother 36   Diabetes Father    Hypertension Father    Heart attack Father 1   Colon cancer Neg Hx    Colon polyps Neg Hx    Esophageal cancer Neg Hx    Stomach cancer Neg Hx    Rectal cancer Neg Hx    Social History   Socioeconomic History   Marital status: Single    Spouse name: Not on file   Number of children: 0   Years of education: 12   Highest education level: 12th grade  Occupational History   Occupation: Retired    Associate Professor: [employer]  Tobacco Use   Smoking status: Former    Current packs/day: 0.00    Average packs/day: 2.0 packs/day for 40.0 years (80.0 ttl pk-yrs)    Types: Cigarettes    Start date: 11/09/1965    Quit date: 11/09/2005    Years since quitting: 17.8   Smokeless tobacco: Never  Vaping Use   Vaping status: Never Used  Substance and Sexual Activity   Alcohol use: No   Drug use: No   Sexual activity: Not Currently  Other Topics Concern   Not on file  Social History Narrative   Lives alone. No children    VA benefits   Social Drivers of Health   Financial Resource Strain: Low Risk  (09/07/2023)   Overall Financial Resource Strain (CARDIA)    Difficulty of Paying Living Expenses: Not hard at all  Food Insecurity: No Food Insecurity (09/07/2023)   Hunger Vital Sign    Worried About Running Out of Food in the Last Year: Never true    Ran Out of Food in the Last Year: Never true  Transportation Needs: No Transportation Needs (09/07/2023)   PRAPARE - Administrator, Civil Service (Medical): No    Lack of Transportation (Non-Medical): No  Physical Activity: Sufficiently Active (09/07/2023)   Exercise Vital Sign     Days of Exercise per Week: 5 days    Minutes of Exercise per Session: 30 min  Stress: No Stress Concern Present (09/07/2023)   [employer] of Occupational Health - Occupational Stress Questionnaire    Feeling of Stress : Not at all  Social Connections: Moderately Integrated (09/07/2023)   Social Connection and Isolation Panel [NHANES]    Frequency of Communication with Friends and Family: More than three times a week    Frequency of Social Gatherings with Friends and Family: Three times a week    Attends Religious Services: More than 4 times per year  Active Member of Clubs or Organizations: Yes    Attends Engineer, structural: More than 4 times per year    Marital Status: Divorced    Tobacco Counseling Counseling given: Not Answered   Clinical Intake:  Pre-visit preparation completed: Yes  Pain : No/denies pain     Diabetes: Yes CBG done?: No Did pt. bring in CBG monitor from home?: No  How often do you need to have someone help you when you read instructions, pamphlets, or other written materials from your doctor or pharmacy?: 1 - Never  Interpreter Needed?: No  Information entered by :: Kandis Fantasia LPN   Activities of Daily Living    09/07/2023    8:51 AM  In your present state of health, do you have any difficulty performing the following activities:  Hearing? 0  Vision? 0  Difficulty concentrating or making decisions? 0  Walking or climbing stairs? 0  Dressing or bathing? 0  Doing errands, shopping? 0  Preparing Food and eating ? N  Using the Toilet? N  In the past six months, have you accidently leaked urine? N  Do you have problems with loss of bowel control? N  Managing your Medications? N  Managing your Finances? N  Housekeeping or managing your Housekeeping? N    Patient Care Team: Bennie Pierini, FNP as PCP - General (Family Medicine) Clinic, [facility]  Indicate any recent Medical Services you may have received  from other than [facility] providers in the past year (date may be approximate).     Assessment:   This is a routine wellness examination for Jorge Garcia.  Hearing/Vision screen Hearing Screening - Comments:: Denies hearing difficulties   Vision Screening - Comments:: up to date with routine eye exams with in office diabetic eye exam; and seen by [facility] specialist     Goals Addressed             This Visit's Progress    Remain active and independent        Depression Screen    09/07/2023    8:53 AM 06/12/2023    8:12 AM 02/02/2023    1:56 PM 12/11/2022    8:18 AM 09/01/2022   11:37 AM 06/06/2022    9:13 AM 02/13/2022    8:12 AM  PHQ 2/9 Scores  PHQ - 2 Score 0 0 6 6 0 0 0  PHQ- 9 Score  0 18 18  0 0    Fall Risk    09/07/2023    8:55 AM 06/12/2023    8:12 AM 02/02/2023    1:56 PM 12/11/2022    8:18 AM 09/01/2022    8:53 AM  Fall Risk   Falls in the past year? 0 0 0 0 0  Number falls in past yr: 0    0  Injury with Fall? 0    0  Risk for fall due to : No Fall Risks      Follow up Falls prevention discussed;Education provided;Falls evaluation completed    Falls prevention discussed;Education provided;Falls evaluation completed    MEDICARE RISK AT HOME: Medicare Risk at Home Any stairs in or around the home?: Yes If so, are there any without handrails?: No Home free of loose throw rugs in walkways, pet beds, electrical cords, etc?: Yes Adequate lighting in your home to reduce risk of falls?: Yes Life alert?: No Use of a cane, walker or w/c?: No Grab bars in the bathroom?: Yes Shower chair or bench in shower?: No  Elevated toilet seat or a handicapped toilet?: Yes  TIMED UP AND GO:  Was the test performed?  No    Cognitive Function:    01/27/2018   11:06 AM 01/26/2017   11:07 AM  MMSE - Mini Mental State Exam  Orientation to time 5 5  Orientation to Place 5 5  Registration 3 3  Attention/ Calculation 5 5  Recall 2 2  Language- name 2 objects 2 2  Language- repeat 1 0   Language- follow 3 step command 3 3  Language- read & follow direction 1 1  Write a sentence 0 1  Copy design 1 1  Total score 28 28        09/07/2023    8:55 AM 09/01/2022    8:58 AM 05/28/2021    3:03 PM 01/31/2019   11:07 AM  6CIT Screen  What Year? 0 points 0 points 0 points 0 points  What month? 0 points 0 points 0 points 0 points  What time? 0 points 0 points 0 points 0 points  Count back from 20 0 points 0 points 0 points 0 points  Months in reverse 0 points 0 points 0 points 0 points  Repeat phrase 0 points 0 points 0 points 0 points  Total Score 0 points 0 points 0 points 0 points    Immunizations Immunization History  Administered Date(s) Administered   Fluad Quad(high Dose 65+) 05/23/2019, 05/04/2020, 05/31/2021   Fluad Trivalent(High Dose 65+) 05/07/2023   Influenza, High Dose Seasonal PF 05/19/2016, 05/26/2017, 05/25/2018   Influenza-Unspecified 05/11/2013, 05/20/2014, 05/26/2015, 06/04/2022   Moderna Sars-Covid-2 Vaccination 09/14/2019, 10/12/2019, 06/19/2020   Pneumococcal Conjugate-13 05/27/2013   Pneumococcal Polysaccharide-23 12/22/2014   Td 05/12/2003, 12/06/2018   Zoster, Live 04/27/2015    TDAP status: Up to date  Flu Vaccine status: Up to date  Pneumococcal vaccine status: Up to date  Covid-19 vaccine status: Information provided on how to obtain vaccines.   Qualifies for Shingles Vaccine? Yes   Zostavax completed Yes   Shingrix Completed?: No.    Education has been provided regarding the importance of this vaccine. Patient has been advised to call insurance company to determine out of pocket expense if they have not yet received this vaccine. Advised may also receive vaccine at local pharmacy or Health Dept. Verbalized acceptance and understanding.  Screening Tests Health Maintenance  Topic Date Due   COVID-19 Vaccine (4 - 2024-25 season) 04/12/2023   Zoster Vaccines- Shingrix (1 of 2) 09/12/2023 (Originally 03/04/1998)   HEMOGLOBIN A1C   12/10/2023   Diabetic kidney evaluation - eGFR measurement  06/11/2024   Diabetic kidney evaluation - Urine ACR  06/11/2024   FOOT EXAM  06/11/2024   OPHTHALMOLOGY EXAM  07/06/2024   Medicare Annual Wellness (AWV)  09/06/2024   Colonoscopy  04/22/2025   DTaP/Tdap/Td (3 - Tdap) 12/05/2028   Pneumonia Vaccine 74+ Years old  Completed   INFLUENZA VACCINE  Completed   Hepatitis C Screening  Completed   HPV VACCINES  Aged Out    Health Maintenance  Health Maintenance Due  Topic Date Due   COVID-19 Vaccine (4 - 2024-25 season) 04/12/2023    Colorectal cancer screening: Type of screening: Colonoscopy. Completed 04/22/22. Repeat every 3 years  Lung Cancer Screening: (Low Dose CT Chest recommended if Age 23-80 years, 20 pack-year currently smoking OR have quit w/in 15years.) does not qualify.   Lung Cancer Screening Referral: n/a  Additional Screening:  Hepatitis C Screening: does qualify; Completed 02/08/16  Vision Screening:  Recommended annual ophthalmology exams for early detection of glaucoma and other disorders of the eye. Is the patient up to date with their annual eye exam?  Yes  Who is the provider or what is the name of the office in which the patient attends annual eye exams? In office and with [facility] If pt is not established with a provider, would they like to be referred to a provider to establish care? No .   Dental Screening: Recommended annual dental exams for proper oral hygiene  Diabetic Foot Exam: Diabetic Foot Exam: Completed 06/12/23  Community Resource Referral / Chronic Care Management: CRR required this visit?  No   CCM required this visit?  No     Plan:     I have personally reviewed and noted the following in the patient's chart:   Medical and social history Use of alcohol, tobacco or illicit drugs  Current medications and supplements including opioid prescriptions. Patient is not currently taking opioid prescriptions. Functional ability  and status Nutritional status Physical activity Advanced directives List of other physicians Hospitalizations, surgeries, and ER visits in previous 12 months Vitals Screenings to include cognitive, depression, and falls Referrals and appointments  In addition, I have reviewed and discussed with patient certain preventive protocols, quality metrics, and best practice recommendations. A written personalized care plan for preventive services as well as general preventive health recommendations were provided to patient.     Kandis Fantasia [place], [place]   [id number]   After Visit Summary: (Mail) Due to this being a telephonic visit, the after visit summary with patients personalized plan was offered to patient via mail   Nurse Notes: No concerns at this time

## 2023-09-07 NOTE — Progress Notes (Signed)
Patient is in office today for a nurse visit for B12 Injection. Patient Injection was given in the  Right deltoid. Patient tolerated injection well.

## 2023-09-07 NOTE — Patient Instructions (Signed)
Jorge Garcia , Thank you for taking time to come for your Medicare Wellness Visit. I appreciate your ongoing commitment to your health goals. Please review the following plan we discussed and let me know if I can assist you in the future.   Referrals/Orders/Follow-Ups/Clinician Recommendations: Aim for 30 minutes of exercise or brisk walking, 6-8 glasses of water, and 5 servings of fruits and vegetables each day.  This is a list of the screening recommended for you and due dates:  Health Maintenance  Topic Date Due   COVID-19 Vaccine (4 - 2024-25 season) 04/12/2023   Zoster (Shingles) Vaccine (1 of 2) 09/12/2023*   Hemoglobin A1C  12/10/2023   Yearly kidney function blood test for diabetes  06/11/2024   Yearly kidney health urinalysis for diabetes  06/11/2024   Complete foot exam   06/11/2024   Eye exam for diabetics  07/06/2024   Medicare Annual Wellness Visit  09/06/2024   Colon Cancer Screening  04/22/2025   DTaP/Tdap/Td vaccine (3 - Tdap) 12/05/2028   Pneumonia Vaccine  Completed   Flu Shot  Completed   Hepatitis C Screening  Completed   HPV Vaccine  Aged Out  *Topic was postponed. The date shown is not the original due date.    Advanced directives: (ACP Link)Information on Advanced Care Planning can be found at [facility] Advance Health Care Directives Advance Health Care Directives ([email or web address]   Next Medicare Annual Wellness Visit scheduled for next year: Yes

## 2023-10-06 ENCOUNTER — Ambulatory Visit (INDEPENDENT_AMBULATORY_CARE_PROVIDER_SITE_OTHER): Payer: Medicare HMO | Admitting: Nurse Practitioner

## 2023-10-06 ENCOUNTER — Encounter: Payer: Self-pay | Admitting: Nurse Practitioner

## 2023-10-06 VITALS — BP 138/70 | HR 77 | Temp 98.0°F | Ht 73.0 in | Wt 211.0 lb

## 2023-10-06 DIAGNOSIS — R509 Fever, unspecified: Secondary | ICD-10-CM

## 2023-10-06 NOTE — Progress Notes (Signed)
   Subjective:    Patient ID: Jorge Garcia, male    DOB: 1946-02-10, 78 y.o.   MRN: [id number]   Chief Complaint: Shaking (Since Saturday/)   HPI  Patient comes in c/o shakes and fluctuating fever. This started last Friday night. Temp was 101 at the highest. Tylenol would bring it down. He started theraflu but that increased his blood sugars so he stopped taking it. Shaking episodes would last a couple of minutes. No episodes since Sunday. Patient Active Problem List   Diagnosis Date Noted   Encounter for screening for lung cancer 10/28/2019   BMI 29.0-29.9,adult 07/19/2015   Gastroesophageal reflux disease without esophagitis 12/22/2014   Diabetes mellitus treated with oral medication (HCC) 06/12/2014   Erectile dysfunction associated with type 2 diabetes mellitus (HCC) 03/17/2014   Hypertension 11/14/2010   Hyperlipidemia associated with type 2 diabetes mellitus (HCC) 11/14/2010       Review of Systems  Constitutional:  Positive for chills, fatigue and fever.  HENT: Negative.    Respiratory:  Negative for cough and shortness of breath.   Cardiovascular: Negative.   Genitourinary: Negative.   Neurological:  Positive for tremors (possibly). Negative for seizures.  Psychiatric/Behavioral: Negative.         Objective:   Physical Exam Constitutional:      Appearance: Normal appearance.  Cardiovascular:     Rate and Rhythm: Normal rate and regular rhythm.     Heart sounds: Normal heart sounds.  Pulmonary:     Effort: Pulmonary effort is normal.     Breath sounds: Normal breath sounds.  Skin:    General: Skin is warm.  Neurological:     General: No focal deficit present.     Mental Status: He is alert and oriented to person, place, and time.  Psychiatric:        Mood and Affect: Mood normal.        Behavior: Behavior normal.     BP 138/70   Pulse 77   Temp 98 F (36.7 C) (Temporal)   Ht 6\' 1"  (1.854 m)   Wt 211 lb (95.7 kg)   SpO2 100%   BMI 27.84 kg/m         Assessment & Plan:   [name] in today with chief complaint of Shaking (Since Saturday/)   1. Fever and chills (Primary) Probably viral in nature Continue tylenol is needed Force fluids RTO prn   The above assessment and management plan was discussed with the patient. The patient verbalized understanding of and has agreed to the management plan. Patient is aware to call the clinic if symptoms persist or worsen. Patient is aware when to return to the clinic for a follow-up visit. Patient educated on when it is appropriate to go to the emergency department.   Mary-Margaret Daphine Deutscher, FNP

## 2023-10-06 NOTE — Patient Instructions (Signed)
 Fever, Adult     A fever is a high body temperature that is 100.53F (38C) or higher. Brief mild or moderate fevers often have no lasting effects. They often do not need treatment. Moderate or high fevers can feel uncomfortable. Sometimes, they can be a sign of a serious problem. A fever that keeps coming back or that lasts a long time may cause you to lose water in your body (get dehydrated). You can use a thermometer to check for a fever. Temperature can change with: Age. Time of day. Where the temperature is taken, such as: In the mouth. In the opening of the butt (anus). In the ear. Under the arm. On the forehead. Follow these instructions at home: Medicines Take over-the-counter and prescription medicines only as told by your doctor. Follow instructions on how much medicine to take and how often. If you were prescribed antibiotics, take them as told by your doctor. Do not stop taking them even if you start to feel better. General instructions Watch for any changes in your symptoms. Tell your doctor about them. Rest as needed. Drink enough fluid to keep your pee (urine) pale yellow. Bathe or sponge bathe with room-temperature water as needed. This helps to lower your body temperature. Do not use cold water. Do not use too many blankets or wear clothes that are too heavy. You should stay home from work and public places for at least 24 hours after your fever is gone. Your fever should be gone without the use of medicines. Contact a doctor if: You vomit or have watery poop (diarrhea) that does not get better. You cannot eat or drink without vomiting. It hurts when you pee. Your symptoms do not get better with treatment or you have new symptoms. You have a rash on your skin. You have signs of not having enough water in your body, such as: Dark pee, very little pee, or no pee. Cracked lips or dry mouth. Sunken eyes. Sleepiness. Weakness. Get help right away if: You are short of  breath or have trouble breathing. You are dizzy or pass out (faint). You feel mixed up or confused. You have very bad pain in your belly (abdomen). These symptoms may be an emergency. Get help right away. Call 911. Do not wait to see if the symptoms will go away. Do not drive yourself to the hospital. This information is not intended to replace advice given to you by your health care provider. Make sure you discuss any questions you have with your health care provider. Document Revised: 04/29/2022 Document Reviewed: 04/29/2022 Elsevier Patient Education  2024 [employer].

## 2023-10-08 ENCOUNTER — Ambulatory Visit: Payer: Medicare HMO

## 2023-10-14 ENCOUNTER — Ambulatory Visit

## 2023-10-14 DIAGNOSIS — E538 Deficiency of other specified B group vitamins: Secondary | ICD-10-CM | POA: Diagnosis not present

## 2023-10-14 NOTE — Progress Notes (Signed)
 Patient is in office today for a nurse visit for B12 Injection. Patient Injection was given in the  Left deltoid. Patient tolerated injection well.

## 2023-10-16 ENCOUNTER — Ambulatory Visit: Admitting: Family Medicine

## 2023-10-16 ENCOUNTER — Ambulatory Visit: Payer: Self-pay | Admitting: Nurse Practitioner

## 2023-10-16 ENCOUNTER — Ambulatory Visit (INDEPENDENT_AMBULATORY_CARE_PROVIDER_SITE_OTHER)

## 2023-10-16 ENCOUNTER — Encounter: Payer: Self-pay | Admitting: Family Medicine

## 2023-10-16 VITALS — BP 96/57 | HR 77 | Temp 97.4°F | Ht 73.0 in | Wt 207.0 lb

## 2023-10-16 DIAGNOSIS — E1165 Type 2 diabetes mellitus with hyperglycemia: Secondary | ICD-10-CM | POA: Diagnosis not present

## 2023-10-16 DIAGNOSIS — R6889 Other general symptoms and signs: Secondary | ICD-10-CM | POA: Diagnosis not present

## 2023-10-16 DIAGNOSIS — Z7984 Long term (current) use of oral hypoglycemic drugs: Secondary | ICD-10-CM

## 2023-10-16 DIAGNOSIS — R61 Generalized hyperhidrosis: Secondary | ICD-10-CM

## 2023-10-16 DIAGNOSIS — I959 Hypotension, unspecified: Secondary | ICD-10-CM | POA: Diagnosis not present

## 2023-10-16 LAB — URINALYSIS, ROUTINE W REFLEX MICROSCOPIC
Bilirubin, UA: NEGATIVE
Ketones, UA: NEGATIVE
Leukocytes,UA: NEGATIVE
Nitrite, UA: NEGATIVE
Specific Gravity, UA: 1.01 (ref 1.005–1.030)
Urobilinogen, Ur: 1 mg/dL (ref 0.2–1.0)
pH, UA: 6 (ref 5.0–7.5)

## 2023-10-16 LAB — GLUCOSE HEMOCUE WAIVED: Glu Hemocue Waived: 230 mg/dL — ABNORMAL HIGH (ref 70–99)

## 2023-10-16 LAB — MICROSCOPIC EXAMINATION
Bacteria, UA: NONE SEEN
Epithelial Cells (non renal): NONE SEEN /HPF (ref 0–10)
Renal Epithel, UA: NONE SEEN /HPF
WBC, UA: NONE SEEN /HPF (ref 0–5)
Yeast, UA: NONE SEEN

## 2023-10-16 LAB — BAYER DCA HB A1C WAIVED: HB A1C (BAYER DCA - WAIVED): 7.8 % — ABNORMAL HIGH (ref 4.8–5.6)

## 2023-10-16 MED ORDER — AZITHROMYCIN 250 MG PO TABS: ORAL_TABLET | ORAL | 0 refills | Status: DC

## 2023-10-16 MED ORDER — AMOXICILLIN-POT CLAVULANATE 875-125 MG PO TABS: 1.0000 | ORAL_TABLET | Freq: Two times a day (BID) | ORAL | 0 refills | Status: AC

## 2023-10-16 NOTE — Telephone Encounter (Signed)
  Chief Complaint: elevated glucose- patient concerned Symptoms: elevated fasting glucose for 2 days- over 250 fasting Frequency: 2 days Pertinent Negatives: Patient denies , fever, frequent urination, difficulty breathing, dizziness, weakness, vomiting Disposition: [] ED /[] Urgent Care (no appt availability in office) / [x] Appointment(In office/virtual)/ []  [place] [facility]/ [] Home Care/ [] Refused Recommended Disposition /[] [place] [facility]/ []  Follow-up with PCP Additional Notes: Patient is concerned about his glucose level- he could not get it below 200 yesterday and fasting today was 300. Patient has requested an appointment and has been scheduled.

## 2023-10-16 NOTE — Progress Notes (Signed)
 Acute Office Visit  Subjective:     Patient ID: Jorge Garcia, male    DOB: 07/28/1946, 78 y.o.   MRN: [id number]  Chief Complaint  Patient presents with   Diabetes    HPI Here with wife today. Patient is in today for fasting blood sguar of 200-306 for 2-3 days. Normal fasting blood sugars for him are 120-130s. He checks this daily. Compliant with metformin 1000 mg BID. Report some general fatigue. Had a fever for 2-3 days around 10/06/23 with chills and shaking. No other symtpoms during that time and he has been afebrile since. He does reports night sweats for the last few week. Not severe or drenching. Denies HA, sore throat, ear pain, nausea, vomiting, diarrhea, abdominal pain, urinary symptoms, flank pain, blurred vision, shortness of breath, chest pain, cough, congestion, dizziness, focal weakness. No changes in diet. Hasn't eaten since last night. Last A1c was 7.1.   ROS As per HPI.      Objective:    BP (!) 96/57   Pulse 77   Temp (!) 97.4 F (36.3 C) (Temporal)   Ht 6\' 1"  (1.854 m)   Wt 207 lb (93.9 kg)   SpO2 96%   BMI 27.31 kg/m  BP Readings from Last 3 Encounters:  10/16/23 (!) 96/57  10/06/23 138/70  06/12/23 123/72   Wt Readings from Last 3 Encounters:  10/16/23 207 lb (93.9 kg)  10/06/23 211 lb (95.7 kg)  09/07/23 208 lb (94.3 kg)   Physical Exam Vitals and nursing note reviewed.  Constitutional:      General: He is not in acute distress.    Appearance: He is not ill-appearing, toxic-appearing or diaphoretic.  HENT:     Head: Normocephalic and atraumatic.     Right Ear: Tympanic membrane, ear canal and external ear normal.     Left Ear: Tympanic membrane, ear canal and external ear normal.     Nose: Nose normal.     Mouth/Throat:     Mouth: Mucous membranes are moist.     Pharynx: Oropharynx is clear. No oropharyngeal exudate or posterior oropharyngeal erythema.  Eyes:     General:        Right eye: No discharge.        Left eye: No discharge.      Conjunctiva/sclera: Conjunctivae normal.     Pupils: Pupils are equal, round, and reactive to light.  Neck:     Vascular: No carotid bruit.  Cardiovascular:     Rate and Rhythm: Normal rate and regular rhythm.     Heart sounds: Normal heart sounds. No murmur heard. Pulmonary:     Effort: Pulmonary effort is normal. No respiratory distress.     Breath sounds: Normal breath sounds. No wheezing, rhonchi or rales.  Abdominal:     General: Bowel sounds are normal. There is no distension.     Palpations: Abdomen is soft.     Tenderness: There is no abdominal tenderness. There is no right CVA tenderness, left CVA tenderness, guarding or rebound.  Musculoskeletal:     Cervical back: Neck supple. No rigidity or tenderness.     Right lower leg: No edema.     Left lower leg: No edema.  Lymphadenopathy:     Cervical: No cervical adenopathy.  Skin:    General: Skin is warm and dry.  Neurological:     General: No focal deficit present.     Mental Status: He is alert and oriented to person, place, and time.  Psychiatric:        Mood and Affect: Mood normal.        Behavior: Behavior normal.     Results for orders placed or performed in visit on 10/16/23  Microscopic Examination   Urine  Result Value Ref Range   WBC, UA None seen 0 - 5 /hpf   RBC, Urine 0-2 0 - 2 /hpf   Epithelial Cells (non renal) None seen 0 - 10 /hpf   Renal Epithel, UA None seen None seen /hpf   Bacteria, UA None seen None seen/Few   Yeast, UA None seen None seen  Glucose Hemocue Waived  Result Value Ref Range   Glu Hemocue Waived 230 (H) 70 - 99 mg/dL  Urinalysis, Routine w reflex microscopic  Result Value Ref Range   Specific Gravity, UA 1.010 1.005 - 1.030   pH, UA 6.0 5.0 - 7.5   Color, UA Yellow Yellow   Appearance Ur Clear Clear   Leukocytes,UA Negative Negative   Protein,UA Trace (A) Negative/Trace   Glucose, UA Trace (A) Negative   Ketones, UA Negative Negative   RBC, UA Trace (A) Negative    Bilirubin, UA Negative Negative   Urobilinogen, Ur 1.0 0.2 - 1.0 mg/dL   Nitrite, UA Negative Negative   Microscopic Examination See below:   Bayer DCA Hb A1c Waived  Result Value Ref Range   HB A1C (BAYER DCA - WAIVED) 7.8 (H) 4.8 - 5.6 %        Assessment & Plan:   Jorge Garcia was seen today for diabetes.  Diagnoses and all orders for this visit:  Type 2 diabetes mellitus with hyperglycemia, without long-term current use of insulin (HCC) -     Glucose Hemocue Waived -     Bayer DCA Hb A1c Waived -     CBC with Differential/Platelet -     CMP14+EGFR  Night sweats -     DG Chest 2 View; Future -     Urinalysis, Routine w reflex microscopic -     Urine Culture -     TSH -     amoxicillin-clavulanate (AUGMENTIN) 875-125 MG tablet; Take 1 tablet by mouth 2 (two) times daily for 7 days. -     azithromycin (ZITHROMAX Z-PAK) 250 MG tablet; As directed -     Microscopic Examination  Hypotension, unspecified hypotension type   Acutely elevated blood sugars for 2-3 days. Blood sugar 230 today at visit, has not eaten since last night. No ketones in urine. Trace glucose in urine. No signs of infection on UA. Suspect infection as cause to elevated blood sugars, though source is unknown. Will treat empirically with augmentin and azithromycin though CXR is normal per radiology report. Continue to monitor blood sugars closely. Discussed to go to ER for blood sugars persistently >350, dizziness, abdominal pain, fever, nausea, vomiting, etc. Will notify patient of results and plan of care on Monday.   The patient indicates understanding of these issues and agrees with the plan.  Total time spent caring for the patient today was 47 minutes. This includes time spent before the visit reviewing the chart, time spent during the visit, and time spent after the visit on documentation   Caress Reffitt Hughes Better, FNP

## 2023-10-16 NOTE — Telephone Encounter (Signed)
 Summary: Blood Sugar   Copied From CRM [id number]. Reason for Triage: Patient is calling in because his blood sugar is reading 306. No appointments available for today with pcp     Reason for Disposition  [1] Blood glucose 240 - 300 mg/dL (78.4 - 69.6 mmol/L) AND [2] does not  use insulin (e.g., not insulin-dependent; most people with type 2 diabetes)  Answer Assessment - Initial Assessment Questions 1. BLOOD GLUCOSE: "What is your blood glucose level?"      308 , 255   yesterday 280 2. ONSET: "When did you check the blood glucose?"     9:30- fasting 3. USUAL RANGE: "What is your glucose level usually?" (e.g., usual fasting morning value, usual evening value)     120-130  5. TYPE 1 or 2:  "Do you know what type of diabetes you have?"  (e.g., Type 1, Type 2, Gestational; doesn't know)      Type 2 6. INSULIN: "Do you take insulin?" "What type of insulin(s) do you use? What is the mode of delivery? (syringe, pen; injection or pump)?"      no 7. DIABETES PILLS: "Do you take any pills for your diabetes?" If Yes, ask: "Have you missed taking any pills recently?"     Metformin  8. OTHER SYMPTOMS: "Do you have any symptoms?" (e.g., fever, frequent urination, difficulty breathing, dizziness, weakness, vomiting)     no  Protocols used: Diabetes - High Blood Sugar-A-AH

## 2023-10-17 LAB — CMP14+EGFR
ALT: 103 IU/L — ABNORMAL HIGH (ref 0–44)
AST: 76 IU/L — ABNORMAL HIGH (ref 0–40)
Albumin: 4.2 g/dL (ref 3.8–4.8)
Alkaline Phosphatase: 594 IU/L — ABNORMAL HIGH (ref 44–121)
BUN/Creatinine Ratio: 15 (ref 10–24)
BUN: 17 mg/dL (ref 8–27)
Bilirubin Total: 2.1 mg/dL — ABNORMAL HIGH (ref 0.0–1.2)
CO2: 23 mmol/L (ref 20–29)
Calcium: 10.2 mg/dL (ref 8.6–10.2)
Chloride: 94 mmol/L — ABNORMAL LOW (ref 96–106)
Creatinine, Ser: 1.12 mg/dL (ref 0.76–1.27)
Globulin, Total: 3 g/dL (ref 1.5–4.5)
Glucose: 191 mg/dL — ABNORMAL HIGH (ref 70–99)
Potassium: 5.5 mmol/L — ABNORMAL HIGH (ref 3.5–5.2)
Sodium: 134 mmol/L (ref 134–144)
Total Protein: 7.2 g/dL (ref 6.0–8.5)
eGFR: 69 mL/min/{1.73_m2} (ref >59)

## 2023-10-17 LAB — CBC WITH DIFFERENTIAL/PLATELET
Basophils Absolute: 0.1 10*3/uL (ref 0.0–0.2)
Basos: 0 %
EOS (ABSOLUTE): 0.4 10*3/uL (ref 0.0–0.4)
Eos: 3 %
Hematocrit: 41.4 % (ref 37.5–51.0)
Hemoglobin: 13.3 g/dL (ref 13.0–17.7)
Immature Grans (Abs): 0.1 10*3/uL (ref 0.0–0.1)
Immature Granulocytes: 1 %
Lymphocytes Absolute: 2.2 10*3/uL (ref 0.7–3.1)
Lymphs: 19 %
MCH: 26.9 pg (ref 26.6–33.0)
MCHC: 32.1 g/dL (ref 31.5–35.7)
MCV: 84 fL (ref 79–97)
Monocytes Absolute: 0.9 10*3/uL (ref 0.1–0.9)
Monocytes: 8 %
Neutrophils Absolute: 7.8 10*3/uL — ABNORMAL HIGH (ref 1.4–7.0)
Neutrophils: 69 %
Platelets: 436 10*3/uL (ref 150–450)
RBC: 4.94 x10E6/uL (ref 4.14–5.80)
RDW: 13.6 % (ref 11.6–15.4)
WBC: 11.4 10*3/uL — ABNORMAL HIGH (ref 3.4–10.8)

## 2023-10-17 LAB — TSH: TSH: 3.84 u[IU]/mL (ref 0.450–4.500)

## 2023-10-18 LAB — URINE CULTURE

## 2023-10-19 ENCOUNTER — Telehealth: Payer: Self-pay | Admitting: Nurse Practitioner

## 2023-10-19 ENCOUNTER — Ambulatory Visit (HOSPITAL_COMMUNITY)
Admission: RE | Admit: 2023-10-19 | Discharge: 2023-10-19 | Disposition: A | Discharge status: Home / Self Care | Source: Ambulatory Visit | Attending: Family Medicine | Admitting: Family Medicine

## 2023-10-19 ENCOUNTER — Ambulatory Visit: Payer: Self-pay | Admitting: Nurse Practitioner

## 2023-10-19 ENCOUNTER — Other Ambulatory Visit

## 2023-10-19 ENCOUNTER — Other Ambulatory Visit: Payer: Self-pay | Admitting: *Deleted

## 2023-10-19 DIAGNOSIS — R748 Abnormal levels of other serum enzymes: Secondary | ICD-10-CM | POA: Insufficient documentation

## 2023-10-19 DIAGNOSIS — K8020 Calculus of gallbladder without cholecystitis without obstruction: Secondary | ICD-10-CM | POA: Diagnosis not present

## 2023-10-19 DIAGNOSIS — R17 Unspecified jaundice: Secondary | ICD-10-CM | POA: Insufficient documentation

## 2023-10-19 DIAGNOSIS — E875 Hyperkalemia: Secondary | ICD-10-CM

## 2023-10-19 DIAGNOSIS — R932 Abnormal findings on diagnostic imaging of liver and biliary tract: Secondary | ICD-10-CM

## 2023-10-19 LAB — HEPATIC FUNCTION PANEL
ALT: 78 IU/L — ABNORMAL HIGH (ref 0–44)
AST: 41 IU/L — ABNORMAL HIGH (ref 0–40)
Albumin: 4.2 g/dL (ref 3.8–4.8)
Alkaline Phosphatase: 469 IU/L — ABNORMAL HIGH (ref 44–121)
Bilirubin Total: 1.2 mg/dL (ref 0.0–1.2)
Bilirubin, Direct: 0.68 mg/dL — ABNORMAL HIGH (ref 0.00–0.40)
Total Protein: 7 g/dL (ref 6.0–8.5)

## 2023-10-19 LAB — POTASSIUM: Potassium: 4.9 mmol/L (ref 3.5–5.2)

## 2023-10-19 NOTE — Telephone Encounter (Signed)
 Pt says that he will not be able to make it to [place] apt at 10:30am today. I tried to give pt AP number to r/s pt refused and asked for the person to call him to r/c.

## 2023-10-19 NOTE — Telephone Encounter (Signed)
 Patient returning call, stating he received a call 15 min ago. This RN could not find notes, but did look under APPTS and saw notes about patient needing to be informed to arrive 2 hours early to CT Scan at [facility] tomorrow so he can drink Oral Contrast. This RN advised patient of these instructions. Patient verbalized understanding. Please call patient back first thing in AM if any additional info is needed.  Reason for Disposition  Health Information question, no triage required and triager able to answer question  Answer Assessment - Initial Assessment Questions 1. REASON FOR CALL or QUESTION: "What is your reason for calling today?" or "How can I best help you?" or "What question do you have that I can help answer?"     Patient returning call, stating he received a call 15 min ago. This RN could not find notes, but did look under APPTS and saw notes about patient needing to be informed to arrive 2 hours early to CT Scan at [facility] tomorrow so he can drink Oral Contrast. This RN advised patient of these instructions. Patient verbalized understanding.  Protocols used: Information Only Call - No Triage-A-AH

## 2023-10-19 NOTE — Telephone Encounter (Signed)
 Agent, Barbara Cower, attempted transfer from [facility] to report stat labs and call was dropped. Unsure of [facility] contact info. Informed supervisors.   Copied from CRM [id number]. Topic: Clinical - Lab/Test Results >> Oct 19, 2023  3:32 PM Marlow Baars wrote: Reason for CRM: [facility]ella with [facility] called with STAT results so I will transfer the call to [facility]

## 2023-10-19 NOTE — Addendum Note (Signed)
 Addended by: Hessie Diener on: 10/19/2023 04:06 PM   Modules accepted: Orders

## 2023-10-20 ENCOUNTER — Ambulatory Visit (HOSPITAL_COMMUNITY)
Admission: RE | Admit: 2023-10-20 | Discharge: 2023-10-20 | Disposition: A | Discharge status: Home / Self Care | Source: Ambulatory Visit | Attending: Family Medicine | Admitting: Family Medicine

## 2023-10-20 ENCOUNTER — Encounter (HOSPITAL_COMMUNITY): Payer: Self-pay | Admitting: Radiology

## 2023-10-20 ENCOUNTER — Other Ambulatory Visit (HOSPITAL_COMMUNITY): Payer: Self-pay | Admitting: Nurse Practitioner

## 2023-10-20 DIAGNOSIS — R748 Abnormal levels of other serum enzymes: Secondary | ICD-10-CM | POA: Insufficient documentation

## 2023-10-20 DIAGNOSIS — K819 Cholecystitis, unspecified: Secondary | ICD-10-CM

## 2023-10-20 DIAGNOSIS — R932 Abnormal findings on diagnostic imaging of liver and biliary tract: Secondary | ICD-10-CM | POA: Insufficient documentation

## 2023-10-20 DIAGNOSIS — R17 Unspecified jaundice: Secondary | ICD-10-CM | POA: Insufficient documentation

## 2023-10-20 DIAGNOSIS — N289 Disorder of kidney and ureter, unspecified: Secondary | ICD-10-CM | POA: Diagnosis not present

## 2023-10-20 DIAGNOSIS — K8070 Calculus of gallbladder and bile duct without cholecystitis without obstruction: Secondary | ICD-10-CM | POA: Diagnosis not present

## 2023-10-20 MED ORDER — IOHEXOL 9 MG/ML PO SOLN
ORAL | Status: AC
  Filled 2023-10-20: qty 1000

## 2023-10-20 MED ORDER — IOHEXOL 9 MG/ML PO SOLN: 500.0000 mL | ORAL | Status: AC

## 2023-10-20 MED ORDER — IOHEXOL 300 MG/ML  SOLN
100.0000 mL | Freq: Once | INTRAMUSCULAR | Status: AC | PRN
  Administered 2023-10-20: 100 mL via INTRAVENOUS

## 2023-10-20 NOTE — Telephone Encounter (Signed)
 At this time Patient has STAT CT of Abdomen ordered placed. I have contacted Patient x2 with no call back in regards to STAT Appt.   Please Advise.

## 2023-10-23 ENCOUNTER — Telehealth: Payer: Self-pay | Admitting: Nurse Practitioner

## 2023-10-23 NOTE — Telephone Encounter (Signed)
 Copied from CRM [id number]. Topic: Referral - Status >> Oct 22, 2023  4:16 PM Fuller Mandril wrote: Reason for CRM: Patient called to leave message for [facility]. Would like her to give him a call about scheduling surgery. Checking to see if she heard when his surgery will be scheduled. Thank you.

## 2023-10-23 NOTE — Telephone Encounter (Signed)
 Referral has been sent to [facility] in [place] - Their Office will reach out to Patient for scheduling.

## 2023-10-27 ENCOUNTER — Other Ambulatory Visit: Payer: Self-pay

## 2023-10-27 ENCOUNTER — Encounter (HOSPITAL_COMMUNITY): Payer: Self-pay | Admitting: *Deleted

## 2023-10-27 ENCOUNTER — Emergency Department (HOSPITAL_COMMUNITY)

## 2023-10-27 ENCOUNTER — Ambulatory Visit (INDEPENDENT_AMBULATORY_CARE_PROVIDER_SITE_OTHER): Admitting: Surgery

## 2023-10-27 ENCOUNTER — Inpatient Hospital Stay (HOSPITAL_COMMUNITY)
Admission: EM | Admit: 2023-10-27 | Discharge: 2023-11-03 | DRG: 417 | Disposition: A | Discharge status: Home / Self Care | Source: Ambulatory Visit | Attending: Internal Medicine | Admitting: Internal Medicine

## 2023-10-27 ENCOUNTER — Encounter: Payer: Self-pay | Admitting: Surgery

## 2023-10-27 VITALS — BP 119/71 | HR 78 | Temp 98.2°F | Resp 12 | Ht 73.0 in | Wt 202.0 lb

## 2023-10-27 DIAGNOSIS — N179 Acute kidney failure, unspecified: Secondary | ICD-10-CM | POA: Diagnosis present

## 2023-10-27 DIAGNOSIS — E785 Hyperlipidemia, unspecified: Secondary | ICD-10-CM | POA: Diagnosis not present

## 2023-10-27 DIAGNOSIS — M479 Spondylosis, unspecified: Secondary | ICD-10-CM | POA: Diagnosis present

## 2023-10-27 DIAGNOSIS — J439 Emphysema, unspecified: Secondary | ICD-10-CM | POA: Diagnosis present

## 2023-10-27 DIAGNOSIS — K838 Other specified diseases of biliary tract: Secondary | ICD-10-CM | POA: Diagnosis not present

## 2023-10-27 DIAGNOSIS — K2100 Gastro-esophageal reflux disease with esophagitis, without bleeding: Secondary | ICD-10-CM | POA: Diagnosis present

## 2023-10-27 DIAGNOSIS — E876 Hypokalemia: Secondary | ICD-10-CM | POA: Diagnosis present

## 2023-10-27 DIAGNOSIS — K839 Disease of biliary tract, unspecified: Secondary | ICD-10-CM | POA: Diagnosis not present

## 2023-10-27 DIAGNOSIS — Z8249 Family history of ischemic heart disease and other diseases of the circulatory system: Secondary | ICD-10-CM | POA: Diagnosis not present

## 2023-10-27 DIAGNOSIS — E1169 Type 2 diabetes mellitus with other specified complication: Secondary | ICD-10-CM | POA: Diagnosis not present

## 2023-10-27 DIAGNOSIS — Q446 Cystic disease of liver: Secondary | ICD-10-CM | POA: Diagnosis not present

## 2023-10-27 DIAGNOSIS — Z87891 Personal history of nicotine dependence: Secondary | ICD-10-CM | POA: Diagnosis not present

## 2023-10-27 DIAGNOSIS — E119 Type 2 diabetes mellitus without complications: Secondary | ICD-10-CM

## 2023-10-27 DIAGNOSIS — K209 Esophagitis, unspecified without bleeding: Secondary | ICD-10-CM | POA: Diagnosis not present

## 2023-10-27 DIAGNOSIS — K8050 Calculus of bile duct without cholangitis or cholecystitis without obstruction: Secondary | ICD-10-CM | POA: Diagnosis not present

## 2023-10-27 DIAGNOSIS — K2980 Duodenitis without bleeding: Secondary | ICD-10-CM | POA: Diagnosis not present

## 2023-10-27 DIAGNOSIS — Z9889 Other specified postprocedural states: Secondary | ICD-10-CM | POA: Diagnosis not present

## 2023-10-27 DIAGNOSIS — Z833 Family history of diabetes mellitus: Secondary | ICD-10-CM | POA: Diagnosis not present

## 2023-10-27 DIAGNOSIS — Z7982 Long term (current) use of aspirin: Secondary | ICD-10-CM

## 2023-10-27 DIAGNOSIS — K222 Esophageal obstruction: Secondary | ICD-10-CM | POA: Diagnosis present

## 2023-10-27 DIAGNOSIS — Z79899 Other long term (current) drug therapy: Secondary | ICD-10-CM

## 2023-10-27 DIAGNOSIS — Z4682 Encounter for fitting and adjustment of non-vascular catheter: Secondary | ICD-10-CM | POA: Diagnosis not present

## 2023-10-27 DIAGNOSIS — Z7984 Long term (current) use of oral hypoglycemic drugs: Secondary | ICD-10-CM | POA: Diagnosis not present

## 2023-10-27 DIAGNOSIS — K8064 Calculus of gallbladder and bile duct with chronic cholecystitis without obstruction: Principal | ICD-10-CM | POA: Diagnosis present

## 2023-10-27 DIAGNOSIS — K832 Perforation of bile duct: Secondary | ICD-10-CM | POA: Diagnosis not present

## 2023-10-27 DIAGNOSIS — I10 Essential (primary) hypertension: Secondary | ICD-10-CM | POA: Diagnosis not present

## 2023-10-27 DIAGNOSIS — K219 Gastro-esophageal reflux disease without esophagitis: Secondary | ICD-10-CM | POA: Diagnosis not present

## 2023-10-27 DIAGNOSIS — K8020 Calculus of gallbladder without cholecystitis without obstruction: Secondary | ICD-10-CM | POA: Diagnosis not present

## 2023-10-27 DIAGNOSIS — R112 Nausea with vomiting, unspecified: Secondary | ICD-10-CM | POA: Diagnosis not present

## 2023-10-27 DIAGNOSIS — R945 Abnormal results of liver function studies: Secondary | ICD-10-CM | POA: Diagnosis present

## 2023-10-27 DIAGNOSIS — K5710 Diverticulosis of small intestine without perforation or abscess without bleeding: Secondary | ICD-10-CM | POA: Diagnosis not present

## 2023-10-27 DIAGNOSIS — Z9049 Acquired absence of other specified parts of digestive tract: Secondary | ICD-10-CM | POA: Diagnosis not present

## 2023-10-27 DIAGNOSIS — E559 Vitamin D deficiency, unspecified: Secondary | ICD-10-CM | POA: Diagnosis present

## 2023-10-27 DIAGNOSIS — R748 Abnormal levels of other serum enzymes: Secondary | ICD-10-CM | POA: Diagnosis present

## 2023-10-27 DIAGNOSIS — Z860101 Personal history of adenomatous and serrated colon polyps: Secondary | ICD-10-CM | POA: Diagnosis not present

## 2023-10-27 DIAGNOSIS — K8010 Calculus of gallbladder with chronic cholecystitis without obstruction: Secondary | ICD-10-CM | POA: Diagnosis not present

## 2023-10-27 DIAGNOSIS — R7989 Other specified abnormal findings of blood chemistry: Secondary | ICD-10-CM

## 2023-10-27 DIAGNOSIS — K8044 Calculus of bile duct with chronic cholecystitis without obstruction: Secondary | ICD-10-CM | POA: Diagnosis not present

## 2023-10-27 DIAGNOSIS — K8070 Calculus of gallbladder and bile duct without cholecystitis without obstruction: Secondary | ICD-10-CM | POA: Diagnosis not present

## 2023-10-27 LAB — COMPREHENSIVE METABOLIC PANEL
ALT: 240 U/L — ABNORMAL HIGH (ref 0–44)
AST: 94 U/L — ABNORMAL HIGH (ref 15–41)
Albumin: 3.7 g/dL (ref 3.5–5.0)
Alkaline Phosphatase: 409 U/L — ABNORMAL HIGH (ref 38–126)
Anion gap: 13 (ref 5–15)
BUN: 32 mg/dL — ABNORMAL HIGH (ref 8–23)
CO2: 21 mmol/L — ABNORMAL LOW (ref 22–32)
Calcium: 9.4 mg/dL (ref 8.9–10.3)
Chloride: 101 mmol/L (ref 98–111)
Creatinine, Ser: 1.18 mg/dL (ref 0.61–1.24)
GFR, Estimated: >60 mL/min (ref >60)
Glucose, Bld: 156 mg/dL — ABNORMAL HIGH (ref 70–99)
Potassium: 3.3 mmol/L — ABNORMAL LOW (ref 3.5–5.1)
Sodium: 135 mmol/L (ref 135–145)
Total Bilirubin: 2.5 mg/dL — ABNORMAL HIGH (ref 0.0–1.2)
Total Protein: 7.5 g/dL (ref 6.5–8.1)

## 2023-10-27 LAB — CBC WITH DIFFERENTIAL/PLATELET
Abs Immature Granulocytes: 0.02 10*3/uL (ref 0.00–0.07)
Basophils Absolute: 0 10*3/uL (ref 0.0–0.1)
Basophils Relative: 1 %
Eosinophils Absolute: 0.2 10*3/uL (ref 0.0–0.5)
Eosinophils Relative: 2 %
HCT: 38.5 % — ABNORMAL LOW (ref 39.0–52.0)
Hemoglobin: 12.6 g/dL — ABNORMAL LOW (ref 13.0–17.0)
Immature Granulocytes: 0 %
Lymphocytes Relative: 23 %
Lymphs Abs: 1.9 10*3/uL (ref 0.7–4.0)
MCH: 27.5 pg (ref 26.0–34.0)
MCHC: 32.7 g/dL (ref 30.0–36.0)
MCV: 84.1 fL (ref 80.0–100.0)
Monocytes Absolute: 0.7 10*3/uL (ref 0.1–1.0)
Monocytes Relative: 9 %
Neutro Abs: 5.2 10*3/uL (ref 1.7–7.7)
Neutrophils Relative %: 65 %
Platelets: 281 10*3/uL (ref 150–400)
RBC: 4.58 MIL/uL (ref 4.22–5.81)
RDW: 13.8 % (ref 11.5–15.5)
WBC: 8 10*3/uL (ref 4.0–10.5)
nRBC: 0 % (ref 0.0–0.2)

## 2023-10-27 LAB — LIPASE, BLOOD: Lipase: 104 U/L — ABNORMAL HIGH (ref 11–51)

## 2023-10-27 MED ORDER — ACETAMINOPHEN 650 MG RE SUPP: 650.0000 mg | Freq: Four times a day (QID) | RECTAL | Status: DC | PRN

## 2023-10-27 MED ORDER — POTASSIUM CHLORIDE IN NACL 40-0.9 MEQ/L-% IV SOLN
INTRAVENOUS | Status: AC
  Filled 2023-10-27: qty 1000

## 2023-10-27 MED ORDER — ONDANSETRON HCL 4 MG PO TABS
4.0000 mg | ORAL_TABLET | Freq: Four times a day (QID) | ORAL | Status: DC | PRN
  Filled 2023-10-27: qty 1

## 2023-10-27 MED ORDER — INSULIN ASPART 100 UNIT/ML IJ SOLN
0.0000 [IU] | INTRAMUSCULAR | Status: DC
  Administered 2023-10-28: 2 [IU] via SUBCUTANEOUS
  Administered 2023-10-28: 7 [IU] via SUBCUTANEOUS
  Administered 2023-10-28 (×2): 2 [IU] via SUBCUTANEOUS
  Administered 2023-10-29: 7 [IU] via SUBCUTANEOUS
  Administered 2023-10-29: 2 [IU] via SUBCUTANEOUS
  Administered 2023-10-29: 1 [IU] via SUBCUTANEOUS
  Administered 2023-10-29: 7 [IU] via SUBCUTANEOUS
  Administered 2023-10-29: 2 [IU] via SUBCUTANEOUS
  Administered 2023-10-30: 3 [IU] via SUBCUTANEOUS
  Administered 2023-10-30: 2 [IU] via SUBCUTANEOUS
  Administered 2023-10-30 (×2): 3 [IU] via SUBCUTANEOUS
  Administered 2023-10-30: 2 [IU] via SUBCUTANEOUS
  Administered 2023-10-31: 3 [IU] via SUBCUTANEOUS
  Administered 2023-10-31: 2 [IU] via SUBCUTANEOUS
  Administered 2023-10-31: 5 [IU] via SUBCUTANEOUS
  Administered 2023-10-31: 2 [IU] via SUBCUTANEOUS
  Administered 2023-10-31 – 2023-11-01 (×3): 3 [IU] via SUBCUTANEOUS
  Administered 2023-11-01 (×2): 2 [IU] via SUBCUTANEOUS
  Administered 2023-11-01 (×3): 3 [IU] via SUBCUTANEOUS
  Administered 2023-11-02: 2 [IU] via SUBCUTANEOUS
  Administered 2023-11-02: 1 [IU] via SUBCUTANEOUS
  Administered 2023-11-02 (×2): 2 [IU] via SUBCUTANEOUS
  Administered 2023-11-02: 5 [IU] via SUBCUTANEOUS
  Administered 2023-11-03: 3 [IU] via SUBCUTANEOUS
  Administered 2023-11-03: 7 [IU] via SUBCUTANEOUS
  Administered 2023-11-03: 3 [IU] via SUBCUTANEOUS
  Administered 2023-11-03: 7 [IU] via SUBCUTANEOUS

## 2023-10-27 MED ORDER — ONDANSETRON HCL 4 MG/2ML IJ SOLN: 4.0000 mg | Freq: Once | INTRAMUSCULAR | Status: DC

## 2023-10-27 MED ORDER — BENAZEPRIL HCL 20 MG PO TABS
40.0000 mg | ORAL_TABLET | Freq: Every day | ORAL | Status: DC
  Administered 2023-10-28 – 2023-10-30 (×3): 40 mg via ORAL
  Filled 2023-10-27 (×4): qty 2

## 2023-10-27 MED ORDER — AMLODIPINE BESYLATE 10 MG PO TABS
10.0000 mg | ORAL_TABLET | Freq: Every day | ORAL | Status: DC
  Administered 2023-10-28 – 2023-11-03 (×6): 10 mg via ORAL
  Filled 2023-10-27 (×7): qty 1

## 2023-10-27 MED ORDER — HYDROCHLOROTHIAZIDE 12.5 MG PO TABS
25.0000 mg | ORAL_TABLET | Freq: Every day | ORAL | Status: DC
  Administered 2023-10-28 – 2023-10-30 (×3): 25 mg via ORAL
  Filled 2023-10-27 (×3): qty 2

## 2023-10-27 MED ORDER — ONDANSETRON HCL 4 MG/2ML IJ SOLN: 4.0000 mg | Freq: Four times a day (QID) | INTRAMUSCULAR | Status: DC | PRN

## 2023-10-27 MED ORDER — PANTOPRAZOLE SODIUM 40 MG PO TBEC
40.0000 mg | DELAYED_RELEASE_TABLET | Freq: Every day | ORAL | Status: DC
Start: 2023-10-28 — End: 2023-10-28
  Administered 2023-10-28: 40 mg via ORAL
  Filled 2023-10-27: qty 1

## 2023-10-27 MED ORDER — GADOBUTROL 1 MMOL/ML IV SOLN
10.0000 mL | Freq: Once | INTRAVENOUS | Status: AC | PRN
  Administered 2023-10-27: 10 mL via INTRAVENOUS

## 2023-10-27 MED ORDER — ACETAMINOPHEN 325 MG PO TABS
650.0000 mg | ORAL_TABLET | Freq: Four times a day (QID) | ORAL | Status: DC | PRN
  Administered 2023-10-30 – 2023-11-02 (×3): 650 mg via ORAL
  Filled 2023-10-27 (×5): qty 2

## 2023-10-27 MED ORDER — POLYETHYLENE GLYCOL 3350 17 G PO PACK
17.0000 g | PACK | Freq: Every day | ORAL | Status: DC | PRN
  Administered 2023-10-31: 17 g via ORAL
  Filled 2023-10-27: qty 1

## 2023-10-27 NOTE — ED Triage Notes (Signed)
 Pt in POV reports Dr. Robyne Peers with [facility] told him to come to the ED for imaging of his abd to assess if he continues to have a gallstone in his duct, pt reports he is suppose to have a cholecystectomy, pt denies pain, denies n/v/d, A&O x4

## 2023-10-27 NOTE — Progress Notes (Signed)
 Received call from Riki Sheer, PA with Jorge Garcia. Patient has bile duct stones on MRCP. Imaging reviewed by Dr. Leone Payor. Will get patient transferred to [facility] for ERCP tomorrow at 3 pm. Jorge Garcia will contact Hospitalist about transfer / admission to [facility]. Asked that he be NPO after MN.    WBC 8, afebrile Lipase 104 without pancreatitis on MRI   Latest Reference Range & Units 10/27/23 11:25  Alkaline Phosphatase 38 - 126 U/L 409 (H)  Albumin 3.5 - 5.0 g/dL 3.7  Lipase 11 - 51 U/L 104 (H)  AST 15 - 41 U/L 94 (H)  ALT 0 - 44 U/L 240 (H)  Total Protein 6.5 - 8.1 g/dL 7.5  Total Bilirubin 0.0 - 1.2 mg/dL 2.5 (H)   MRI / MRCP IMPRESSION: 1. Numerous gallstones contracted in the gallbladder or a gallbladder remnant. No gallbladder wall thickening. 2. Multiple small gallstones and gravel throughout the cystic duct as well as within the central common hepatic duct and common bile duct within the pancreatic head. Calculi measure up to 0.5 cm. 3. Common hepatic duct is dilated up to 1.0 cm with intrahepatic ductal dilatation, similar to prior examination. 4. Periampullary descending duodenal diverticulum.

## 2023-10-27 NOTE — ED Provider Notes (Cosign Needed Addendum)
 Jorge Garcia Provider Note   CSN: [id number] Arrival date & time: 10/27/23  7829     History  Chief Complaint  Patient presents with   Abdominal Pain   HPI Jorge Garcia is a 78 y.o. male presenting for gallbladder issue.  States recent CT scan showed evidence of gallstones along with evidence of choledocholithiasis.  Was seen today by his surgeon Santina Evans Poppayliou, DO who advised to come to the ED for an MRCP for further evaluation.  At this time patient does not have any abdominal pain.  Endorses nausea but no vomiting or diarrhea.  Had a normal bowel movement yesterday.  Denies urinary symptoms and fever.  Past Medical History:  Diagnosis Date   Arthritis    back   Cataract    bilateral sx   Diabetes mellitus without complication (HCC)    on meds   GERD (gastroesophageal reflux disease)    on meds   Hyperlipidemia    on meds   Hypertension    on meds   Internal hemorrhoids    Tubulovillous adenoma of colon 2009   Vitamin D deficiency       Abdominal Pain      Home Medications Prior to Admission medications   Medication Sig Start Date End Date Taking? Authorizing Provider  Accu-Chek Softclix Lancets lancets USE 1 LANCET TO CHECK GLUCOSE 4 TIMES DAILY 01/28/22   Daphine Deutscher, Mary-Margaret, FNP  amLODipine (NORVASC) 10 MG tablet Take 1 tablet (10 mg total) by mouth daily. 06/12/23   Daphine Deutscher, Mary-Margaret, FNP  aspirin EC 81 MG tablet Take 81 mg by mouth daily.    [provider]  benazepril (LOTENSIN) 40 MG tablet Take 1 tablet (40 mg total) by mouth daily. 06/12/23   Daphine Deutscher Mary-Margaret, FNP  cholecalciferol (VITAMIN D) 1000 UNITS tablet Take 2,000 Units by mouth daily.     [provider]  fish oil-omega-3 fatty acids 1000 MG capsule Take 1 g by mouth daily.    [provider]  glucose blood (ACCU-CHEK AVIVA PLUS) test strip CHECK GLUCOSE 4 TIMES DAILY Dx E11.9 08/30/21   Daphine Deutscher, Mary-Margaret,  FNP  hydrochlorothiazide (HYDRODIURIL) 25 MG tablet Take 1 tablet (25 mg total) by mouth daily. 06/12/23   Daphine Deutscher, Mary-Margaret, FNP  ibuprofen (ADVIL) 400 MG tablet Take 1 tablet (400 mg total) by mouth every 6 (six) hours as needed. 12/06/18   Eber Hong, MD  metFORMIN (GLUCOPHAGE) 1000 MG tablet Take 1 tablet (1,000 mg total) by mouth 2 (two) times daily with a meal. (NEEDS TO BE SEEN BEFORE NEXT REFILL) 06/12/23   Daphine Deutscher, Mary-Margaret, FNP  omeprazole (PRILOSEC) 40 MG capsule Take 1 capsule (40 mg total) by mouth daily. 06/12/23   Daphine Deutscher, Mary-Margaret, FNP  rosuvastatin (CRESTOR) 10 MG tablet Take 1 tablet (10 mg total) by mouth daily. 06/12/23   Bennie Pierini, FNP      Allergies    Patient has no known allergies.    Review of Systems   Review of Systems  Gastrointestinal:  Positive for abdominal pain.    Physical Exam Updated Vital Signs BP 130/66   Pulse 61   Temp 97.9 F (36.6 C) (Oral)   Resp 18   Ht 6\' 1"  (1.854 m)   Wt 91.6 kg   SpO2 95%   BMI 26.65 kg/m  Physical Exam Vitals and nursing note reviewed.  HENT:     Head: Normocephalic and atraumatic.     Mouth/Throat:     Mouth:  Mucous membranes are moist.  Eyes:     General:        Right eye: No discharge.        Left eye: No discharge.     Conjunctiva/sclera: Conjunctivae normal.  Cardiovascular:     Rate and Rhythm: Normal rate and regular rhythm.     Pulses: Normal pulses.     Heart sounds: Normal heart sounds.  Pulmonary:     Effort: Pulmonary effort is normal.     Breath sounds: Normal breath sounds.  Abdominal:     General: Abdomen is flat. There is no distension.     Palpations: Abdomen is soft.     Tenderness: There is no abdominal tenderness.  Skin:    General: Skin is warm and dry.  Neurological:     General: No focal deficit present.  Psychiatric:        Mood and Affect: Mood normal.     ED Results / Procedures / Treatments   Labs (all labs ordered are listed, but only abnormal  results are displayed) Labs Reviewed  CBC WITH DIFFERENTIAL/PLATELET - Abnormal; Notable for the following components:      Result Value   Hemoglobin 12.6 (*)    HCT 38.5 (*)    All other components within normal limits  COMPREHENSIVE METABOLIC PANEL - Abnormal; Notable for the following components:   Potassium 3.3 (*)    CO2 21 (*)    Glucose, Bld 156 (*)    BUN 32 (*)    AST 94 (*)    ALT 240 (*)    Alkaline Phosphatase 409 (*)    Total Bilirubin 2.5 (*)    All other components within normal limits  LIPASE, BLOOD - Abnormal; Notable for the following components:   Lipase 104 (*)    All other components within normal limits    EKG None  Radiology MR ABDOMEN MRCP W WO CONTAST Result Date: 10/27/2023 CLINICAL DATA:  Cholelithiasis, choledocholithiasis suspected by prior CT EXAM: MRI ABDOMEN WITHOUT AND WITH CONTRAST (INCLUDING MRCP) TECHNIQUE: Multiplanar multisequence MR imaging of the abdomen was performed both before and after the administration of intravenous contrast. Heavily T2-weighted images of the biliary and pancreatic ducts were obtained, and three-dimensional MRCP images were rendered by post processing. CONTRAST:  10mL GADAVIST GADOBUTROL 1 MMOL/ML IV SOLN COMPARISON:  CT abdomen pelvis, 10/20/2023 FINDINGS: Lower chest: No acute abnormality. Hepatobiliary: No solid liver abnormality is seen. Numerous gallstones contracted in the gallbladder or a gallbladder remnant (series 5, image 14). No gallbladder wall thickening. Multiple small gallstones and gravel throughout the cystic duct measuring up to 0.5 cm, as well as within the central common hepatic duct and common bile duct within the pancreatic head (series 5, image 15, series 8, image 70). Relatively low insertion of the cystic duct. Common hepatic duct is dilated up to 1.0 cm with intrahepatic ductal dilatation, similar to prior examination (series 3, image 17). Pancreas: Unremarkable. No pancreatic ductal dilatation or  surrounding inflammatory changes. Spleen: Normal in size without significant abnormality. Adrenals/Urinary Tract: Adrenal glands are unremarkable. Kidneys are normal, without renal calculi, solid lesion, or hydronephrosis. Stomach/Bowel: Stomach is within normal limits. Periampullary descending duodenal diverticulum. No evidence of bowel wall thickening, distention, or inflammatory changes. Vascular/Lymphatic: Aortic atherosclerosis. No enlarged abdominal lymph nodes. Other: No abdominal wall hernia or abnormality. No ascites. Musculoskeletal: No acute or significant osseous findings. IMPRESSION: 1. Numerous gallstones contracted in the gallbladder or a gallbladder remnant. No gallbladder wall thickening. 2. Multiple small  gallstones and gravel throughout the cystic duct as well as within the central common hepatic duct and common bile duct within the pancreatic head. Calculi measure up to 0.5 cm. 3. Common hepatic duct is dilated up to 1.0 cm with intrahepatic ductal dilatation, similar to prior examination. 4. Periampullary descending duodenal diverticulum. Aortic Atherosclerosis (ICD10-I70.0). Electronically Signed   By: Jearld Lesch M.D.   On: 10/27/2023 14:05   MR 3D Recon At Scanner Result Date: 10/27/2023 CLINICAL DATA:  Cholelithiasis, choledocholithiasis suspected by prior CT EXAM: MRI ABDOMEN WITHOUT AND WITH CONTRAST (INCLUDING MRCP) TECHNIQUE: Multiplanar multisequence MR imaging of the abdomen was performed both before and after the administration of intravenous contrast. Heavily T2-weighted images of the biliary and pancreatic ducts were obtained, and three-dimensional MRCP images were rendered by post processing. CONTRAST:  10mL GADAVIST GADOBUTROL 1 MMOL/ML IV SOLN COMPARISON:  CT abdomen pelvis, 10/20/2023 FINDINGS: Lower chest: No acute abnormality. Hepatobiliary: No solid liver abnormality is seen. Numerous gallstones contracted in the gallbladder or a gallbladder remnant (series 5, image 14).  No gallbladder wall thickening. Multiple small gallstones and gravel throughout the cystic duct measuring up to 0.5 cm, as well as within the central common hepatic duct and common bile duct within the pancreatic head (series 5, image 15, series 8, image 70). Relatively low insertion of the cystic duct. Common hepatic duct is dilated up to 1.0 cm with intrahepatic ductal dilatation, similar to prior examination (series 3, image 17). Pancreas: Unremarkable. No pancreatic ductal dilatation or surrounding inflammatory changes. Spleen: Normal in size without significant abnormality. Adrenals/Urinary Tract: Adrenal glands are unremarkable. Kidneys are normal, without renal calculi, solid lesion, or hydronephrosis. Stomach/Bowel: Stomach is within normal limits. Periampullary descending duodenal diverticulum. No evidence of bowel wall thickening, distention, or inflammatory changes. Vascular/Lymphatic: Aortic atherosclerosis. No enlarged abdominal lymph nodes. Other: No abdominal wall hernia or abnormality. No ascites. Musculoskeletal: No acute or significant osseous findings. IMPRESSION: 1. Numerous gallstones contracted in the gallbladder or a gallbladder remnant. No gallbladder wall thickening. 2. Multiple small gallstones and gravel throughout the cystic duct as well as within the central common hepatic duct and common bile duct within the pancreatic head. Calculi measure up to 0.5 cm. 3. Common hepatic duct is dilated up to 1.0 cm with intrahepatic ductal dilatation, similar to prior examination. 4. Periampullary descending duodenal diverticulum. Aortic Atherosclerosis (ICD10-I70.0). Electronically Signed   By: Jearld Lesch M.D.   On: 10/27/2023 14:05    Procedures Procedures    Medications Ordered in ED Medications  ondansetron (ZOFRAN) injection 4 mg (0 mg Intravenous Hold 10/27/23 1128)  gadobutrol (GADAVIST) 1 MMOL/ML injection 10 mL (10 mLs Intravenous Contrast Given 10/27/23 1312)    ED Course/  Medical Decision Making/ A&P Clinical Course as of 10/27/23 1620  Tue Oct 27, 2023  1430 Dr. Tasia Catchings [JR]  1521 Gunn[address] Fusi, Dr. Leone Payor [JR]    Clinical Course User Index [JR] Gareth Eagle, PA-C                                 Medical Decision Making Amount and/or Complexity of Data Reviewed Labs: ordered. Radiology: ordered.  Risk Prescription drug management. Decision regarding hospitalization.   Initial Impression and Ddx 78 year old well-appearing male presenting for concerns of choledocholithiasis.  Exam was unremarkable.  DDx includes acute cholangitis, acute cholecystitis, acute hepatitis, kidney stone, choledocholithiasis, biliary colic, sepsis, other. Patient PMH that increases complexity of ED encounter: Hypertension, hyperlipidemia, type 2  diabetes, GERD  Interpretation of Diagnostics I independent reviewed and interpreted the labs as followed: Elevated AST, ALT and AP, total bili is 2.5 (8 days ago was 1.2)  - I independently visualized the following imaging with scope of interpretation limited to determining acute life threatening conditions related to emergency care: MRCP, which revealed  1. Numerous gallstones contracted in the gallbladder or a  gallbladder remnant. No gallbladder wall thickening.  2. Multiple small gallstones and gravel throughout the cystic duct  as well as within the central common hepatic duct and common bile  duct within the pancreatic head. Calculi measure up to 0.5 cm.  3. Common hepatic duct is dilated up to 1.0 cm with intrahepatic  ductal dilatation, similar to prior examination.  4. Periampullary descending duodenal diverticulum.    Patient Reassessment and Ultimate Disposition/Management Workup suggestive of choledocholithiasis.  Discussed patient with Dr. Tasia Catchings of GI at [facility] who advised that patient would need transfer to [facility] for further evaluation and likely ERCP.  Also talk to NP Willette Cluster with [facility] who advised  transfer to [facility] and that Dr. Stan Head would likely conduct the ERCP tomorrow morning.  They advised n.p.o. after midnight.  Admitted to [facility] service with Dr. Wendall Stade who is planning to facilitate transfer.  Patient management required discussion with the following services or consulting groups:  Hospitalist Service and Gastroenterology  Complexity of Problems Addressed Acute complicated illness or Injury  Additional Data Reviewed and Analyzed Further history obtained from: Further history from spouse/family member, Past medical history and medications listed in the EMR, and Prior ED visit notes  Patient Encounter Risk Assessment Consideration of hospitalization         Final Clinical Impression(s) / ED Diagnoses Final diagnoses:  Choledocholithiasis    Rx / DC Orders ED Discharge Orders     None         Gareth Eagle, PA-C 10/27/23 1620    Gareth Eagle, PA-C 10/27/23 1640    Cathren Laine, MD 10/28/23 2109

## 2023-10-27 NOTE — Assessment & Plan Note (Addendum)
 Cholelithiasis, choledocholithiasis.  Afebrile.  WBC 8.  Mental status intact.  Elevated liver enzymes AST 94, ALT 240, ALP 409, T. bili 2.5, lipase 104.  MRCP-multiple gallstones in gallbladder, cystic duct, central common hepatic duct and common bile duct within the pancreatic head.  -EDP talked to GI at [facility], admit to [facility], plan for ERCP tomorrow at 3 PM, n.p.o. midnight. -Holding antibiotics for now - N/s + 40kcl 75cc/hr x 12hrs -Will need general surgery eval also

## 2023-10-27 NOTE — ED Notes (Signed)
 [facility] was called for transport for this PT

## 2023-10-27 NOTE — Patient Instructions (Signed)
 Go to ED for MRCP to evaluate for gallstone in the common bile duct.  This potentially requires a different procedure before surgery

## 2023-10-27 NOTE — Progress Notes (Signed)
 [facility] History and Physical  Reason for Referral: Cholelithiasis Referring Physician: Bennie Pierini, FNP  Chief Complaint   New Patient (Initial Visit)     Jorge Garcia is a 78 y.o. male.  HPI: Patient presents for evaluation of gallstones.  He was seen by his primary care doctor on 3/7 secondary to elevated fasting glucose.  His PCP believed this was secondary to an infectious process, but was unsure the source.  He underwent a chest x-ray and subsequent abdominal ultrasound.  Abdominal ultrasound demonstrated gallstones with questionable wall thickening versus contraction.  He subsequently underwent a CT which demonstrated a stone filled gallbladder suspicious for chronic cholecystitis and choledocholithiasis with proximal common duct stones up to 6 mm in size with mild secondary intrahepatic biliary ductal dilation.  He last had LFTs on 3/10, which was the day prior to the CT scan.  At that time, he had elevation in ALP 469, mild elevation in AST and ALT, normal total bilirubin at 2.1 but mildly elevated direct bilirubin at 0.68.  He states that for the last month he has had chills with occasional nausea and vomiting.  He denies any significant abdominal pain.  His last fever was last week prior to his CT scan.  His wife feels that he has been a little bit more confused over the last month, and he endorses decreased energy.  He denies any postprandial abdominal pain.  His past medical history is significant for hypertension, diabetes, hyperlipidemia, and GERD.  He denies history of abdominal surgeries.  He takes an 81 mg aspirin daily.  He denies use of tobacco products, alcohol, and illicit drugs.  Past Medical History:  Diagnosis Date   Arthritis    back   Cataract    bilateral sx   Diabetes mellitus without complication (HCC)    on meds   GERD (gastroesophageal reflux disease)    on meds   Hyperlipidemia    on meds   Hypertension    on meds   Internal  hemorrhoids    Tubulovillous adenoma of colon 2009   Vitamin D deficiency     Past Surgical History:  Procedure Laterality Date   CATARACT EXTRACTION, BILATERAL Bilateral    COLONOSCOPY  12/01/2013   COLONOSCOPY  2020   MS-MAC-suprep(good)-tics/hems/HPPx1/TAx4   COLONOSCOPY  04/22/2022   HEMORRHOID SURGERY  1978   POLYPECTOMY  2020   HPPx1/TAx4   UPPER GASTROINTESTINAL ENDOSCOPY     WISDOM TOOTH EXTRACTION      Family History  Problem Relation Age of Onset   Diabetes Mother    Hypertension Mother    Stroke Mother 75   Diabetes Father    Hypertension Father    Heart attack Father 75   Colon cancer Neg Hx    Colon polyps Neg Hx    Esophageal cancer Neg Hx    Stomach cancer Neg Hx    Rectal cancer Neg Hx     Social History   Tobacco Use   Smoking status: Former    Current packs/day: 0.00    Average packs/day: 2.0 packs/day for 40.0 years (80.0 ttl pk-yrs)    Types: Cigarettes    Start date: 11/09/1965    Quit date: 11/09/2005    Years since quitting: 17.9   Smokeless tobacco: Never  Vaping Use   Vaping status: Never Used  Substance Use Topics   Alcohol use: No   Drug use: No    Medications: I have reviewed the patient's current medications. Allergies as of  10/27/2023   No Known Allergies      Medication List        Accurate as of October 27, 2023  8:56 AM. If you have any questions, ask your nurse or doctor.          STOP taking these medications    azithromycin 250 MG tablet Commonly known as: Zithromax Z-Pak Stopped by: Esme Durkin A Corley Maffeo       TAKE these medications    Accu-Chek Aviva Plus test strip Generic drug: glucose blood CHECK GLUCOSE 4 TIMES DAILY Dx E11.9   Accu-Chek Softclix Lancets lancets USE 1 LANCET TO CHECK GLUCOSE 4 TIMES DAILY   amLODipine 10 MG tablet Commonly known as: NORVASC Take 1 tablet (10 mg total) by mouth daily.   aspirin EC 81 MG tablet Take 81 mg by mouth daily.   benazepril 40 MG tablet Commonly  known as: LOTENSIN Take 1 tablet (40 mg total) by mouth daily.   cholecalciferol 1000 units tablet Commonly known as: VITAMIN D Take 2,000 Units by mouth daily.   fish oil-omega-3 fatty acids 1000 MG capsule Take 1 g by mouth daily.   hydrochlorothiazide 25 MG tablet Commonly known as: HYDRODIURIL Take 1 tablet (25 mg total) by mouth daily.   ibuprofen 400 MG tablet Commonly known as: ADVIL Take 1 tablet (400 mg total) by mouth every 6 (six) hours as needed.   metFORMIN 1000 MG tablet Commonly known as: GLUCOPHAGE Take 1 tablet (1,000 mg total) by mouth 2 (two) times daily with a meal. (NEEDS TO BE SEEN BEFORE NEXT REFILL)   omeprazole 40 MG capsule Commonly known as: PRILOSEC Take 1 capsule (40 mg total) by mouth daily.   rosuvastatin 10 MG tablet Commonly known as: CRESTOR Take 1 tablet (10 mg total) by mouth daily.         ROS:  Constitutional: positive for chills, fatigue, and fevers Eyes: negative for visual disturbance and pain Ears, nose, mouth, throat, and face: negative for ear drainage, sore throat, and sinus problems Respiratory: positive for shortness of breath, negative for cough and wheezing Cardiovascular: negative for chest pain and palpitations Gastrointestinal: positive for nausea, reflux symptoms, and vomiting, negative for abdominal pain Genitourinary:positive for frequency, negative for dysuria Integument/breast: negative for dryness and rash Hematologic/lymphatic: negative for bleeding and lymphadenopathy Musculoskeletal:negative for back pain and neck pain Neurological: negative for dizziness and tremors Endocrine: negative for temperature intolerance  Blood pressure 119/71, pulse 78, temperature 98.2 F (36.8 C), temperature source Oral, resp. rate 12, height 6\' 1"  (1.854 m), weight 202 lb (91.6 kg), SpO2 96%. Physical Exam Vitals reviewed.  Constitutional:      Appearance: Normal appearance.  HENT:     Head: Normocephalic and  atraumatic.  Eyes:     General: No scleral icterus.    Extraocular Movements: Extraocular movements intact.     Pupils: Pupils are equal, round, and reactive to light.  Cardiovascular:     Rate and Rhythm: Normal rate and regular rhythm.  Pulmonary:     Effort: Pulmonary effort is normal.     Breath sounds: Normal breath sounds.  Abdominal:     Comments: Abdomen soft, nondistended, no percussion tenderness, nontender to palpation; no rigidity, guarding, rebound tenderness; negative Murphy sign  Musculoskeletal:        General: Normal range of motion.     Cervical back: Normal range of motion.  Skin:    General: Skin is warm and dry.  Neurological:     General: No  focal deficit present.     Mental Status: He is alert and oriented to person, place, and time.  Psychiatric:        Mood and Affect: Mood normal.        Behavior: Behavior normal.     Results: Abdominal ultrasound (10/19/2023): IMPRESSION: 1. Gallbladder candidate is favored to be contracted around multiple shadowing gallstones. If further imaging is desired, recommend dedicated CT abdomen pelvis with contrast.  CT of the abdomen and pelvis (10/20/2023): IMPRESSION: 1. Stone filled, contracted gallbladder. Findings suspicious for chronic cholecystitis. Cannot exclude mild superimposed acute inflammation. 2. Choledocholithiasis with proximal common duct stones of up to 6 mm. Mild secondary intrahepatic biliary ductal dilatation. 3. Prostatomegaly 4. Coronary artery atherosclerosis. Aortic Atherosclerosis (ICD10-I70.0). Emphysema (ICD10-J43.9). 5. Aortic valvular calcifications. Consider echocardiography to evaluate for valvular dysfunction.   Assessment & Plan:  Jorge Garcia is a 78 y.o. male who presents for evaluation of cholelithiasis.  -I discussed with the patient that while his imaging does demonstrate cholelithiasis, his CT scan also demonstrated choledocholithiasis.  His known choledocholithiasis needs  to be managed prior to any surgical interventions.  I discussed with the patient that he is at risk for developing acute cholangitis with choledocholithiasis. -While patient is afebrile and not tachycardic in the office, I feel the safest option for him is to present to the emergency department for blood work and MRCP to fully evaluate for choledocholithiasis.  If choledocholithiasis is still present, will need referral to GI for ERCP.  If his blood work is otherwise unremarkable and choledocholithiasis is not noted, we will plan to schedule the patient for elective cholecystectomy -I counseled the patient about the indication, risks and benefits of robotic assisted laparoscopic cholecystectomy.  He understands there is a very small chance for bleeding, infection, injury to normal structures (including common bile duct), conversion to open surgery, persistent symptoms, evolution of postcholecystectomy diarrhea, need for secondary interventions, anesthesia reaction, cardiopulmonary issues and other risks not specifically detailed here. I described the expected recovery, the plan for follow-up and the restrictions during the recovery phase.  All questions were answered. -Information provided to the patient regarding cholelithiasis, cholecystitis, cholecystectomy, and low-fat diet -Patient is wife plan to proceed to the emergency department from office -Dr. Denton Lank has been made aware of the patient coming for evaluation  All questions were answered to the satisfaction of the patient and family.  Note: Portions of this report may have been transcribed using voice recognition software. Every effort has been made to ensure accuracy; however, inadvertent computerized transcription errors may still be present.   Theophilus Kinds, DO [facility] [address] [place], [place] [address] [phone] (office)

## 2023-10-27 NOTE — Assessment & Plan Note (Signed)
 Stable. -Resume Norvasc, HCTZ, benazepril

## 2023-10-27 NOTE — H&P (Signed)
 History and Physical    Jorge Garcia ZOX:[id number] DOB: Feb 03, 1946 DOA: 10/27/2023  PCP: Bennie Pierini, FNP   Patient coming from: Home  I have personally briefly reviewed patient's old medical records in [facility]  Chief Complaint: Elevated LFTs.  HPI: Jorge Garcia is a 78 y.o. male with medical history significant for diabetes mellitus, hypertension, GERD. Patient was referred to general surgeon-Dr. Pappayliou for ultrasound findings of gallstones and subsequent CT showing stool-filled gallbladder suspicious for chronic cholecystitis and choledocholithiasis, with intrahepatic biliary ductal dilation.  He saw general surgery today for his first visit and was referred to the ED for MRCP.  On my evaluation, patient is eating lunch, denies any abdominal pain.  No vomiting no nausea.  He is awake alert and oriented and able to answer questions.  ED Course: Temperature 97.9.  Stable vitals.  WBC 8.  Elevated liver enzymes, AST 94, ALT 240, ALP 409, T. bili 2.5.  Lipase 104. MRCP showed numerous gallstones contracted in the gallbladder or a gallbladder remnant, no gallbladder wall thickening.  Bile duct within the pancreatic head. Multiple small gallstones and gravel throughout the cystic duct as well as within the central common hepatic duct and common bile duct within the pancreatic head. Calculi measure up to 0.5 cm. Common hepatic duct is dilated up to 1.0 cm with intrahepatic ductal dilatation, similar to prior examination.  [facility]: GI consulted, admit to [facility], for ERCP 3 PM tomorrow.  N.p.o. after midnight.  Review of Systems: As per HPI all other systems reviewed and negative.  Past Medical History:  Diagnosis Date   Arthritis    back   Cataract    bilateral sx   Diabetes mellitus without complication (HCC)    on meds   GERD (gastroesophageal reflux disease)    on meds   Hyperlipidemia    on meds   Hypertension    on meds   Internal hemorrhoids     Tubulovillous adenoma of colon 2009   Vitamin D deficiency     Past Surgical History:  Procedure Laterality Date   CATARACT EXTRACTION, BILATERAL Bilateral    COLONOSCOPY  12/01/2013   COLONOSCOPY  2020   MS-MAC-suprep(good)-tics/hems/HPPx1/TAx4   COLONOSCOPY  04/22/2022   HEMORRHOID SURGERY  1978   POLYPECTOMY  2020   HPPx1/TAx4   UPPER GASTROINTESTINAL ENDOSCOPY     WISDOM TOOTH EXTRACTION       reports that he quit smoking about 17 years ago. His smoking use included cigarettes. He started smoking about 58 years ago. He has a 80 pack-year smoking history. He has never used smokeless tobacco. He reports that he does not drink alcohol and does not use drugs.  No Known Allergies  Family History  Problem Relation Age of Onset   Diabetes Mother    Hypertension Mother    Stroke Mother 43   Diabetes Father    Hypertension Father    Heart attack Father 32   Colon cancer Neg Hx    Colon polyps Neg Hx    Esophageal cancer Neg Hx    Stomach cancer Neg Hx    Rectal cancer Neg Hx     Prior to Admission medications   Medication Sig Start Date End Date Taking? Authorizing Provider  Accu-Chek Softclix Lancets lancets USE 1 LANCET TO CHECK GLUCOSE 4 TIMES DAILY 01/28/22   Daphine Deutscher, Mary-Margaret, FNP  amLODipine (NORVASC) 10 MG tablet Take 1 tablet (10 mg total) by mouth daily. 06/12/23   Bennie Pierini, FNP  aspirin EC 81 MG tablet Take 81 mg by mouth daily.    [provider]  benazepril (LOTENSIN) 40 MG tablet Take 1 tablet (40 mg total) by mouth daily. 06/12/23   Daphine Deutscher Mary-Margaret, FNP  cholecalciferol (VITAMIN D) 1000 UNITS tablet Take 2,000 Units by mouth daily.     [provider]  fish oil-omega-3 fatty acids 1000 MG capsule Take 1 g by mouth daily.    [provider]  glucose blood (ACCU-CHEK AVIVA PLUS) test strip CHECK GLUCOSE 4 TIMES DAILY Dx E11.9 08/30/21   Daphine Deutscher, Mary-Margaret, FNP  hydrochlorothiazide (HYDRODIURIL) 25 MG tablet Take 1  tablet (25 mg total) by mouth daily. 06/12/23   Daphine Deutscher, Mary-Margaret, FNP  ibuprofen (ADVIL) 400 MG tablet Take 1 tablet (400 mg total) by mouth every 6 (six) hours as needed. 12/06/18   Eber Hong, MD  metFORMIN (GLUCOPHAGE) 1000 MG tablet Take 1 tablet (1,000 mg total) by mouth 2 (two) times daily with a meal. (NEEDS TO BE SEEN BEFORE NEXT REFILL) 06/12/23   Daphine Deutscher, Mary-Margaret, FNP  omeprazole (PRILOSEC) 40 MG capsule Take 1 capsule (40 mg total) by mouth daily. 06/12/23   Daphine Deutscher, Mary-Margaret, FNP  rosuvastatin (CRESTOR) 10 MG tablet Take 1 tablet (10 mg total) by mouth daily. 06/12/23   Bennie Pierini, FNP    Physical Exam: Vitals:   10/27/23 1226 10/27/23 1227 10/27/23 1230 10/27/23 1245  BP: 131/76  130/66   Pulse:  61 61 61  Resp:      Temp:      TempSrc:      SpO2:  94% 95% 95%  Weight:      Height:        Constitutional: NAD, calm, comfortable Vitals:   10/27/23 1226 10/27/23 1227 10/27/23 1230 10/27/23 1245  BP: 131/76  130/66   Pulse:  61 61 61  Resp:      Temp:      TempSrc:      SpO2:  94% 95% 95%  Weight:      Height:       Eyes: PERRL, lids and conjunctivae normal ENMT: Mucous membranes are moist. .  Neck: normal, supple, no masses, no thyromegaly Respiratory: clear to auscultation bilaterally, no wheezing, no crackles. Normal respiratory effort. No accessory muscle use.  Cardiovascular: Regular rate and rhythm, no murmurs / rubs / gallops. No extremity edema.  Extremities warm. Abdomen: no tenderness, no masses palpated. No hepatosplenomegaly. Bowel sounds positive.  Musculoskeletal: no clubbing / cyanosis. No joint deformity upper and lower extremities.  Skin: no rashes, lesions, ulcers. No induration Neurologic:  No facial symmetry, moving extremities spontaneously, speech fluent.  Psychiatric: Normal judgment and insight. Alert and oriented x 3. Normal mood.   Labs on Admission: I have personally reviewed following labs and imaging  studies  CBC: Recent Labs  Lab 10/27/23 1125  WBC 8.0  NEUTROABS 5.2  HGB 12.6*  HCT 38.5*  MCV 84.1  PLT 281   Basic Metabolic Panel: Recent Labs  Lab 10/27/23 1125  NA 135  K 3.3*  CL 101  CO2 21*  GLUCOSE 156*  BUN 32*  CREATININE 1.18  CALCIUM 9.4   GFR: Estimated Creatinine Clearance: 61.1 mL/min (by C-G formula based on SCr of 1.18 mg/dL). Liver Function Tests: Recent Labs  Lab 10/27/23 1125  AST 94*  ALT 240*  ALKPHOS 409*  BILITOT 2.5*  PROT 7.5  ALBUMIN 3.7   Recent Labs  Lab 10/27/23 1125  LIPASE 104*   Urine analysis:  Component Value Date/Time   COLORURINE YELLOW 05/01/2009 1230   APPEARANCEUR Clear 10/16/2023 1402   LABSPEC 1.010 05/01/2009 1230   PHURINE 6.5 05/01/2009 1230   GLUCOSEU Trace (A) 10/16/2023 1402   HGBUR NEGATIVE 05/01/2009 1230   BILIRUBINUR Negative 10/16/2023 1402   KETONESUR NEGATIVE 05/01/2009 1230   PROTEINUR Trace (A) 10/16/2023 1402   PROTEINUR NEGATIVE 05/01/2009 1230   UROBILINOGEN 0.2 05/01/2009 1230   NITRITE Negative 10/16/2023 1402   NITRITE NEGATIVE 05/01/2009 1230   LEUKOCYTESUR Negative 10/16/2023 1402    Radiological Exams on Admission: MR ABDOMEN MRCP W WO CONTAST Result Date: 10/27/2023 CLINICAL DATA:  Cholelithiasis, choledocholithiasis suspected by prior CT EXAM: MRI ABDOMEN WITHOUT AND WITH CONTRAST (INCLUDING MRCP) TECHNIQUE: Multiplanar multisequence MR imaging of the abdomen was performed both before and after the administration of intravenous contrast. Heavily T2-weighted images of the biliary and pancreatic ducts were obtained, and three-dimensional MRCP images were rendered by post processing. CONTRAST:  10mL GADAVIST GADOBUTROL 1 MMOL/ML IV SOLN COMPARISON:  CT abdomen pelvis, 10/20/2023 FINDINGS: Lower chest: No acute abnormality. Hepatobiliary: No solid liver abnormality is seen. Numerous gallstones contracted in the gallbladder or a gallbladder remnant (series 5, image 14). No gallbladder  wall thickening. Multiple small gallstones and gravel throughout the cystic duct measuring up to 0.5 cm, as well as within the central common hepatic duct and common bile duct within the pancreatic head (series 5, image 15, series 8, image 70). Relatively low insertion of the cystic duct. Common hepatic duct is dilated up to 1.0 cm with intrahepatic ductal dilatation, similar to prior examination (series 3, image 17). Pancreas: Unremarkable. No pancreatic ductal dilatation or surrounding inflammatory changes. Spleen: Normal in size without significant abnormality. Adrenals/Urinary Tract: Adrenal glands are unremarkable. Kidneys are normal, without renal calculi, solid lesion, or hydronephrosis. Stomach/Bowel: Stomach is within normal limits. Periampullary descending duodenal diverticulum. No evidence of bowel wall thickening, distention, or inflammatory changes. Vascular/Lymphatic: Aortic atherosclerosis. No enlarged abdominal lymph nodes. Other: No abdominal wall hernia or abnormality. No ascites. Musculoskeletal: No acute or significant osseous findings. IMPRESSION: 1. Numerous gallstones contracted in the gallbladder or a gallbladder remnant. No gallbladder wall thickening. 2. Multiple small gallstones and gravel throughout the cystic duct as well as within the central common hepatic duct and common bile duct within the pancreatic head. Calculi measure up to 0.5 cm. 3. Common hepatic duct is dilated up to 1.0 cm with intrahepatic ductal dilatation, similar to prior examination. 4. Periampullary descending duodenal diverticulum. Aortic Atherosclerosis (ICD10-I70.0). Electronically Signed   By: Jearld Lesch M.D.   On: 10/27/2023 14:05   MR 3D Recon At Scanner Result Date: 10/27/2023 CLINICAL DATA:  Cholelithiasis, choledocholithiasis suspected by prior CT EXAM: MRI ABDOMEN WITHOUT AND WITH CONTRAST (INCLUDING MRCP) TECHNIQUE: Multiplanar multisequence MR imaging of the abdomen was performed both before and  after the administration of intravenous contrast. Heavily T2-weighted images of the biliary and pancreatic ducts were obtained, and three-dimensional MRCP images were rendered by post processing. CONTRAST:  10mL GADAVIST GADOBUTROL 1 MMOL/ML IV SOLN COMPARISON:  CT abdomen pelvis, 10/20/2023 FINDINGS: Lower chest: No acute abnormality. Hepatobiliary: No solid liver abnormality is seen. Numerous gallstones contracted in the gallbladder or a gallbladder remnant (series 5, image 14). No gallbladder wall thickening. Multiple small gallstones and gravel throughout the cystic duct measuring up to 0.5 cm, as well as within the central common hepatic duct and common bile duct within the pancreatic head (series 5, image 15, series 8, image 70). Relatively low insertion of the  cystic duct. Common hepatic duct is dilated up to 1.0 cm with intrahepatic ductal dilatation, similar to prior examination (series 3, image 17). Pancreas: Unremarkable. No pancreatic ductal dilatation or surrounding inflammatory changes. Spleen: Normal in size without significant abnormality. Adrenals/Urinary Tract: Adrenal glands are unremarkable. Kidneys are normal, without renal calculi, solid lesion, or hydronephrosis. Stomach/Bowel: Stomach is within normal limits. Periampullary descending duodenal diverticulum. No evidence of bowel wall thickening, distention, or inflammatory changes. Vascular/Lymphatic: Aortic atherosclerosis. No enlarged abdominal lymph nodes. Other: No abdominal wall hernia or abnormality. No ascites. Musculoskeletal: No acute or significant osseous findings. IMPRESSION: 1. Numerous gallstones contracted in the gallbladder or a gallbladder remnant. No gallbladder wall thickening. 2. Multiple small gallstones and gravel throughout the cystic duct as well as within the central common hepatic duct and common bile duct within the pancreatic head. Calculi measure up to 0.5 cm. 3. Common hepatic duct is dilated up to 1.0 cm with  intrahepatic ductal dilatation, similar to prior examination. 4. Periampullary descending duodenal diverticulum. Aortic Atherosclerosis (ICD10-I70.0). Electronically Signed   By: Jearld Lesch M.D.   On: 10/27/2023 14:05   EKG: None   Assessment/Plan Principal Problem:   Choledocholithiasis Active Problems:   Hypertension   Hyperlipidemia associated with type 2 diabetes mellitus (HCC)   Diabetes mellitus treated with oral medication (HCC)   Assessment and Plan: * Choledocholithiasis Cholelithiasis, choledocholithiasis.  Afebrile.  WBC 8.  Mental status intact.  Elevated liver enzymes AST 94, ALT 240, ALP 409, T. bili 2.5, lipase 104.  MRCP-multiple gallstones in gallbladder, cystic duct, central common hepatic duct and common bile duct within the pancreatic head.  -EDP talked to GI at [facility], admit to [facility] plan for ERCP tomorrow at 3 PM, n.p.o. midnight. -Holding antibiotics for now - N/s + 40kcl 75cc/hr x 12hrs -Will need general surgery eval also  Diabetes mellitus treated with oral medication (HCC) - A1c 7.8. - SSI- S q6h -Hold home metformin  Hyperlipidemia associated with type 2 diabetes mellitus (HCC) Hold Crestor in the setting of elevated liver enzymes  Hypertension Stable. -Resume Norvasc, HCTZ, benazepril    DVT prophylaxis: SCDS for now pending ERCP Code Status: FULL Code Family Communication:Spouse at bedside Disposition Plan: ~ 2 days Consults called: GI, will need gen surg eval also  Admission status: Inpt Med surg I certify that at the point of admission it is my clinical judgment that the patient will require inpatient hospital care spanning beyond 2 midnights from the point of admission due to high intensity of service, high risk for further deterioration and high frequency of surveillance required.   Author: Onnie Boer, MD 10/27/2023 6:28 PM  For on call review [email or web address].

## 2023-10-27 NOTE — Assessment & Plan Note (Signed)
 Hold Crestor in the setting of elevated liver enzymes

## 2023-10-27 NOTE — ED Notes (Signed)
 [place] paged at this time. Jorge Garcia

## 2023-10-27 NOTE — Assessment & Plan Note (Signed)
-   A1c 7.8. - SSI- S q6h -Hold home metformin

## 2023-10-28 ENCOUNTER — Inpatient Hospital Stay (HOSPITAL_COMMUNITY): Admitting: Anesthesiology

## 2023-10-28 ENCOUNTER — Inpatient Hospital Stay (HOSPITAL_COMMUNITY)

## 2023-10-28 ENCOUNTER — Encounter (HOSPITAL_COMMUNITY)
Admission: EM | Disposition: A | Discharge status: Home / Self Care | Payer: Self-pay | Source: Ambulatory Visit | Attending: Internal Medicine

## 2023-10-28 ENCOUNTER — Encounter (HOSPITAL_COMMUNITY): Payer: Self-pay | Admitting: Internal Medicine

## 2023-10-28 DIAGNOSIS — K8050 Calculus of bile duct without cholangitis or cholecystitis without obstruction: Secondary | ICD-10-CM

## 2023-10-28 DIAGNOSIS — K8020 Calculus of gallbladder without cholecystitis without obstruction: Secondary | ICD-10-CM | POA: Diagnosis not present

## 2023-10-28 DIAGNOSIS — K2980 Duodenitis without bleeding: Secondary | ICD-10-CM

## 2023-10-28 DIAGNOSIS — K222 Esophageal obstruction: Secondary | ICD-10-CM

## 2023-10-28 DIAGNOSIS — K209 Esophagitis, unspecified without bleeding: Secondary | ICD-10-CM

## 2023-10-28 DIAGNOSIS — K5710 Diverticulosis of small intestine without perforation or abscess without bleeding: Secondary | ICD-10-CM | POA: Diagnosis not present

## 2023-10-28 DIAGNOSIS — K219 Gastro-esophageal reflux disease without esophagitis: Secondary | ICD-10-CM

## 2023-10-28 DIAGNOSIS — K2100 Gastro-esophageal reflux disease with esophagitis, without bleeding: Secondary | ICD-10-CM

## 2023-10-28 HISTORY — PX: ESOPHAGOGASTRODUODENOSCOPY: SHX5428

## 2023-10-28 HISTORY — PX: ERCP: SHX5425

## 2023-10-28 HISTORY — PX: BIOPSY OF SKIN SUBCUTANEOUS TISSUE AND/OR MUCOUS MEMBRANE: SHX6741

## 2023-10-28 HISTORY — PX: PANCREATIC STENT PLACEMENT: SHX5539

## 2023-10-28 LAB — COMPREHENSIVE METABOLIC PANEL
ALT: 203 U/L — ABNORMAL HIGH (ref 0–44)
AST: 95 U/L — ABNORMAL HIGH (ref 15–41)
Albumin: 2.9 g/dL — ABNORMAL LOW (ref 3.5–5.0)
Alkaline Phosphatase: 334 U/L — ABNORMAL HIGH (ref 38–126)
Anion gap: 12 (ref 5–15)
BUN: 23 mg/dL (ref 8–23)
CO2: 21 mmol/L — ABNORMAL LOW (ref 22–32)
Calcium: 9.1 mg/dL (ref 8.9–10.3)
Chloride: 103 mmol/L (ref 98–111)
Creatinine, Ser: 0.91 mg/dL (ref 0.61–1.24)
GFR, Estimated: >60 mL/min (ref >60)
Glucose, Bld: 195 mg/dL — ABNORMAL HIGH (ref 70–99)
Potassium: 3.5 mmol/L (ref 3.5–5.1)
Sodium: 136 mmol/L (ref 135–145)
Total Bilirubin: 1.8 mg/dL — ABNORMAL HIGH (ref 0.0–1.2)
Total Protein: 6.2 g/dL — ABNORMAL LOW (ref 6.5–8.1)

## 2023-10-28 LAB — CBC
HCT: 35.6 % — ABNORMAL LOW (ref 39.0–52.0)
Hemoglobin: 11.7 g/dL — ABNORMAL LOW (ref 13.0–17.0)
MCH: 27.1 pg (ref 26.0–34.0)
MCHC: 32.9 g/dL (ref 30.0–36.0)
MCV: 82.4 fL (ref 80.0–100.0)
Platelets: 269 10*3/uL (ref 150–400)
RBC: 4.32 MIL/uL (ref 4.22–5.81)
RDW: 13.6 % (ref 11.5–15.5)
WBC: 8.4 10*3/uL (ref 4.0–10.5)
nRBC: 0 % (ref 0.0–0.2)

## 2023-10-28 LAB — GLUCOSE, CAPILLARY
Glucose-Capillary: 192 mg/dL — ABNORMAL HIGH (ref 70–99)
Glucose-Capillary: 169 mg/dL — ABNORMAL HIGH (ref 70–99)
Glucose-Capillary: 154 mg/dL — ABNORMAL HIGH (ref 70–99)
Glucose-Capillary: 135 mg/dL — ABNORMAL HIGH (ref 70–99)
Glucose-Capillary: 153 mg/dL — ABNORMAL HIGH (ref 70–99)
Glucose-Capillary: 324 mg/dL — ABNORMAL HIGH (ref 70–99)

## 2023-10-28 SURGERY — ERCP, WITH INTERVENTION IF INDICATED
Anesthesia: General

## 2023-10-28 MED ORDER — ROCURONIUM BROMIDE 10 MG/ML (PF) SYRINGE
PREFILLED_SYRINGE | INTRAVENOUS | Status: DC | PRN
  Administered 2023-10-28: 50 mg via INTRAVENOUS

## 2023-10-28 MED ORDER — PANTOPRAZOLE SODIUM 40 MG PO TBEC
40.0000 mg | DELAYED_RELEASE_TABLET | Freq: Two times a day (BID) | ORAL | Status: DC
  Administered 2023-10-29 – 2023-11-03 (×11): 40 mg via ORAL
  Filled 2023-10-28 (×11): qty 1

## 2023-10-28 MED ORDER — INDOCYANINE GREEN 25 MG IV SOLR
2.5000 mg | INTRAVENOUS | Status: AC
  Administered 2023-10-29: 2.5 mg via INTRAVENOUS
  Filled 2023-10-28: qty 10

## 2023-10-28 MED ORDER — PHENYLEPHRINE HCL-NACL 20-0.9 MG/250ML-% IV SOLN
INTRAVENOUS | Status: DC | PRN
  Administered 2023-10-28: 30 ug/min via INTRAVENOUS

## 2023-10-28 MED ORDER — SODIUM CHLORIDE 0.9 % IV SOLN
INTRAVENOUS | Status: DC | PRN
  Administered 2023-10-28: 10 mL

## 2023-10-28 MED ORDER — INDOMETHACIN 50 MG RE SUPP
100.0000 mg | Freq: Once | RECTAL | Status: AC
  Administered 2023-10-28: 100 mg via RECTAL
  Filled 2023-10-28 (×2): qty 2

## 2023-10-28 MED ORDER — PHENYLEPHRINE 80 MCG/ML (10ML) SYRINGE FOR IV PUSH (FOR BLOOD PRESSURE SUPPORT)
PREFILLED_SYRINGE | INTRAVENOUS | Status: DC | PRN
  Administered 2023-10-28: 80 ug via INTRAVENOUS
  Administered 2023-10-28 (×3): 160 ug via INTRAVENOUS

## 2023-10-28 MED ORDER — DICLOFENAC SUPPOSITORY 100 MG
RECTAL | Status: AC
  Filled 2023-10-28: qty 1

## 2023-10-28 MED ORDER — LIDOCAINE 2% (20 MG/ML) 5 ML SYRINGE
INTRAMUSCULAR | Status: DC | PRN
  Administered 2023-10-28: 80 mg via INTRAVENOUS

## 2023-10-28 MED ORDER — DEXAMETHASONE SODIUM PHOSPHATE 10 MG/ML IJ SOLN
INTRAMUSCULAR | Status: DC | PRN
  Administered 2023-10-28: 5 mg via INTRAVENOUS

## 2023-10-28 MED ORDER — FENTANYL CITRATE (PF) 100 MCG/2ML IJ SOLN
INTRAMUSCULAR | Status: AC
  Filled 2023-10-28: qty 2

## 2023-10-28 MED ORDER — GLUCAGON HCL RDNA (DIAGNOSTIC) 1 MG IJ SOLR
INTRAMUSCULAR | Status: AC
  Filled 2023-10-28: qty 1

## 2023-10-28 MED ORDER — SODIUM CHLORIDE 0.9 % IV SOLN
1.5000 g | INTRAVENOUS | Status: AC
  Administered 2023-10-28: 1.5 g via INTRAVENOUS
  Filled 2023-10-28 (×2): qty 4

## 2023-10-28 MED ORDER — PROPOFOL 10 MG/ML IV BOLUS
INTRAVENOUS | Status: DC | PRN
  Administered 2023-10-28: 150 mg via INTRAVENOUS

## 2023-10-28 MED ORDER — LACTATED RINGERS IV SOLN: INTRAVENOUS | Status: DC | PRN

## 2023-10-28 MED ORDER — SUGAMMADEX SODIUM 200 MG/2ML IV SOLN
INTRAVENOUS | Status: DC | PRN
  Administered 2023-10-28: 200 mg via INTRAVENOUS

## 2023-10-28 MED ORDER — ONDANSETRON HCL 4 MG/2ML IJ SOLN
INTRAMUSCULAR | Status: DC | PRN
  Administered 2023-10-28: 4 mg via INTRAVENOUS

## 2023-10-28 MED ORDER — DICLOFENAC SUPPOSITORY 100 MG
RECTAL | Status: DC | PRN
  Administered 2023-10-28: 100 mg via RECTAL

## 2023-10-28 NOTE — TOC Initial Note (Signed)
 Transition of Care [facility]) - Initial/Assessment Note    Patient Details  Name: Jorge Garcia MRN: [id number] Date of Birth: March 24, 1946  Transition of Care [facility]) CM/SW Contact:    Lawerance Sabal, RN Phone Number: 10/28/2023, 9:46 AM  Clinical Narrative:                  Patient from home. Plan for ERCP today and will need cholecystectomy following removal of bile duct stone. TOC will continue to follow for DC needs through progression rounds     Barriers to Discharge: Continued Medical Work up   Patient Goals and CMS Choice            Expected Discharge Plan and Services   Discharge Planning Services: CM Consult   Living arrangements for the past 2 months: Single Family Home                                      Prior Living Arrangements/Services Living arrangements for the past 2 months: Single Family Home Lives with:: Significant Other                   Activities of Daily Living      Permission Sought/Granted                  Emotional Assessment              Admission diagnosis:  Choledocholithiasis [K80.50] Patient Active Problem List   Diagnosis Date Noted   Choledocholithiasis 10/27/2023   Encounter for screening for lung cancer 10/28/2019   BMI 29.0-29.9,adult 07/19/2015   Gastroesophageal reflux disease without esophagitis 12/22/2014   Diabetes mellitus treated with oral medication (HCC) 06/12/2014   Erectile dysfunction associated with type 2 diabetes mellitus (HCC) 03/17/2014   Hypertension 11/14/2010   Hyperlipidemia associated with type 2 diabetes mellitus (HCC) 11/14/2010   PCP:  Bennie Pierini, FNP Pharmacy:   [facility] [address], [place] - [address] [place] [address] Phone: [phone] Fax: [phone]     Social Drivers of Health (SDOH) Social History: SDOH Screenings   Food Insecurity: No Food Insecurity (09/07/2023)  Housing: Unknown (09/07/2023)  Transportation  Needs: No Transportation Needs (09/07/2023)  Utilities: Not At Risk (09/07/2023)  Alcohol Screen: Low Risk  (09/07/2023)  Depression (PHQ2-9): Low Risk  (10/06/2023)  Financial Resource Strain: Low Risk  (09/07/2023)  Physical Activity: Sufficiently Active (09/07/2023)  Social Connections: Moderately Integrated (09/07/2023)  Stress: No Stress Concern Present (09/07/2023)  Tobacco Use: Medium Risk (10/27/2023)  Health Literacy: Adequate Health Literacy (09/07/2023)   SDOH Interventions:     Readmission Risk Interventions     No data to display

## 2023-10-28 NOTE — Interval H&P Note (Signed)
 History and Physical Interval Note:  10/28/2023 2:54 PM  Jorge Garcia  has presented today for surgery, with the diagnosis of Choledocholithiasis.  The various methods of treatment have been discussed with the patient and family. After consideration of risks, benefits and other options for treatment, the patient has consented to  Procedure(s): ERCP, WITH INTERVENTION IF INDICATED (N/A) as a surgical intervention.  The patient's history has been reviewed, patient examined, no change in status, stable for surgery.  I have reviewed the patient's chart and labs.  Questions were answered to the patient's satisfaction.     Stan Head

## 2023-10-28 NOTE — Op Note (Addendum)
 [facility] Patient Name: Jorge Garcia Procedure Date : 10/28/2023 MRN: [id number] Attending MD: Iva Boop , MD, [id number] Date of Birth: [birth year] CSN: [id number] Age: 78 Admit Type: Inpatient Procedure:                ERCP Indications:              Bile duct stone(s), For therapy of bile duct                            stone(s) Providers:                Iva Boop, MD, Jacquelyn "Jaci" Clelia Croft, RN,                            Alan Ripper, Technician Referring MD:              Medicines:                General Anesthesia, Unasyn 1.5 g IV, diclofenac 100                            mg per rectum Complications:            No immediate complications. Estimated Blood Loss:     Estimated blood loss was minimal. Procedure:                Pre-Anesthesia Assessment:                           - Prior to the procedure, a History and Physical                            was performed, and patient medications and                            allergies were reviewed. The patient's tolerance of                            previous anesthesia was also reviewed. The risks                            and benefits of the procedure and the sedation                            options and risks were discussed with the patient.                            All questions were answered, and informed consent                            was obtained. Prior Anticoagulants: The patient has                            taken no anticoagulant or antiplatelet agents. ASA  Grade Assessment: III - A patient with severe                            systemic disease. After reviewing the risks and                            benefits, the patient was deemed in satisfactory                            condition to undergo the procedure.                           After obtaining informed consent, the scope was                            passed under direct vision. Throughout the                             procedure, the patient's blood pressure, pulse, and                            oxygen saturations were monitored continuously. The                            [employer] single use                            duodenoscope was introduced through the mouth, and                            used to inject contrast into and used to inject                            contrast into the ventral pancreatic duct. The ERCP                            was performed with difficulty due to challenging                            cannulation because of peridiverticular papilla.                            The patient tolerated the procedure well. Scope In: Scope Out: Findings:      The scout film was normal. Initial attempt to pass duodenoscope       unsuccessful due to esophageal stricture so duodenoscope removed and       gastroscope was inserted. A standard esophagogastroduodenoscopy scope       was used for the examination of the upper gastrointestinal tract. The       scope was passed under direct vision through the upper GI tract. One       benign-appearing, intrinsic severe (stenosis; an endoscope cannot pass)       stenosis was found at the gastroesophageal junction. This stenosis       measured 1  cm (in length). The stenosis was traversed after dilation. A       large-mouthed diverticulum was found in the major papilla. There was       also duodenitis. The gastric antrum was biopsied (? H. pylori), the       distal esopahgus (esophagitis) was biopsied and the esophageal stricture       dilated to 18 mm w/ 15-16.5-18 mm balloon. The duodenoscope was then       inserted and papilla found in D2, on edge of diverticulum. No bile       draining. Papilla cannulated with standard traction sphincterotome and       035 wire which passed into a duct - given low cystic duct insertion       known ? that - contrast injected but this was pancreatic duct. Partial        pancreatogram unremarkable. I personally interpreted the images.       Attempted to cannulate bile duct with wire in pancreatic duct but no       success. 4 cnm 3 Fr pigtail pancreatic stent placed. I then attempted to       cannulate the bile duct with smaller traction sphincterotome and 025       wire but that was not successful either. Procedure terminated. Impression:               - Benign-appearing esophageal stenosis. Associated                            esophagitis. Biopsied this and dilated stricture to                            18 mm w/ balloon                           - Duodenitis                           - Duodenal diverticulum.                           - Attempts at a cholangiogram failed. Difficult                            anatomy due to periampullary diverticulum - wire                            would not turn "up" into bile duct despite multiple                            maneuvers as described above.                           - Normal limited pancreatogram                           - One temporary stent was placed into the ventral                            pancreatic duct. Recommendation:           -  Clear liquids only today                           Will need a repeat ERCP attempt with other                            endoscopist - keep NPO in AM in case this is                            possible tomorrow                           Daily PPI high-dose for GERD + Stricture and                            duodenitis                           F/U pathology Procedure Code(s):        --- Professional ---                           [id number], Endoscopic retrograde                            cholangiopancreatography (ERCP); with placement of                            endoscopic stent into biliary or pancreatic duct,                            including pre- and post-dilation and guide wire                            passage, when performed, including sphincterotomy,                             when performed, each stent                           43249, Esophagogastroduodenoscopy, flexible,                            transoral; with transendoscopic balloon dilation of                            esophagus (less than 30 mm diameter)                           43239, 59, Esophagogastroduodenoscopy, flexible,                            transoral; with biopsy, single or multiple                           74329, Endoscopic catheterization of the pancreatic  ductal system, radiological supervision and                            interpretation Diagnosis Code(s):        --- Professional ---                           K22.2, Esophageal obstruction                           K80.50, Calculus of bile duct without cholangitis                            or cholecystitis without obstruction                           K57.10, Diverticulosis of small intestine without                            perforation or abscess without bleeding CPT copyright 2022 [employer]. All rights reserved. The codes documented in this report are preliminary and upon coder review may  be revised to meet current compliance requirements. Iva Boop, MD 10/28/2023 4:49:35 PM This report has been signed electronically. Number of Addenda: 0

## 2023-10-28 NOTE — Consult Note (Signed)
 Consultation Note   Referring Provider:  [facility] PCP: Bennie Pierini, FNP Primary Gastroenterologist::  Claudette Head, MD      Reason for Consultation: Choledocholithiasis DOA: 10/27/2023         Hospital Day: 2   ASSESSMENT    78 y.o. year old male with cholelithiasis and choledocholithiasis.   History of colon polyps ( Tubular adenomas) Only one subcentimeter TA removed at time of surveillance colonoscopy in 2023.   GERD Manages with Omeprazole as needed  Hypertension Emphysema DM2 Prostamegaly See PMH for additional history     PLAN:   --Plan is for ERCP today at 3 PM with Dr. Leone Payor. The benefits and risks of ERCP with possible sphincterotomy not limited to cardiopulmonary complications of sedation, bleeding, infection, perforation,and pancreatitis were discussed with the patient who agrees to proceed.   --Will need cholecystectomy following removal of bile duct stone  HPI   78 year old male with cholelithiasis.  He was seen outpatient by surgery yesterday for possible chronic cholecystitis as well as choledocholithiasis.  Surgery sent him to [facility] ED for MRCP for MRCP which did confirm cholelithiasis and gallstones in the cystic duct, common hepatic duct and common bile duct.  His liver chemistries were elevated, predominantly cholestatic pattern.  ED at [facility] contacted [place] about transfer for ERCP.  The MRCP was reviewed by our biliary endoscopist Dr. Leone Payor.  Patient was transferred to [facility] overnight for ERCP to be done today. Not having any abdominal pain. Had had some recent nausea. Found to have cholelithiasis and choledocholithiasis during a workup for "an infection" per wife. Never really did have abdominal pain .   Labs and Imaging:  Recent Labs    10/27/23 1125 10/28/23 0427  WBC 8.0 8.4  HGB 12.6* 11.7*  HCT 38.5* 35.6*  MCV 84.1 82.4  PLT 281 269   No results for input(s):  "FOLATE", "VITAMINB12", "FERRITIN", "TIBC", "IRONPCTSAT" in the last 72 hours. Recent Labs    10/27/23 1125 10/28/23 0427  NA 135 136  K 3.3* 3.5  CL 101 103  CO2 21* 21*  GLUCOSE 156* 195*  BUN 32* 23  CREATININE 1.18 0.91  CALCIUM 9.4 9.1   Recent Labs    10/27/23 1125 10/28/23 0427  PROT 7.5 6.2*  ALBUMIN 3.7 2.9*  AST 94* 95*  ALT 240* 203*  ALKPHOS 409* 334*  BILITOT 2.5* 1.8*   No results for input(s): "INR" in the last 72 hours. No results for input(s): "AFPTUMOR" in the last 72 hours.  MR 3D Recon At Scanner CLINICAL DATA:  Cholelithiasis, choledocholithiasis suspected by prior CT  EXAM: MRI ABDOMEN WITHOUT AND WITH CONTRAST (INCLUDING MRCP)  TECHNIQUE: Multiplanar multisequence MR imaging of the abdomen was performed both before and after the administration of intravenous contrast. Heavily T2-weighted images of the biliary and pancreatic ducts were obtained, and three-dimensional MRCP images were rendered by post processing.  CONTRAST:  10mL GADAVIST GADOBUTROL 1 MMOL/ML IV SOLN  COMPARISON:  CT abdomen pelvis, 10/20/2023  FINDINGS: Lower chest: No acute abnormality.  Hepatobiliary: No solid liver abnormality is seen. Numerous gallstones contracted in the gallbladder or a gallbladder remnant (series 5, image 14). No gallbladder wall thickening. Multiple small gallstones and gravel throughout the cystic  duct measuring up to 0.5 cm, as well as within the central common hepatic duct and common bile duct within the pancreatic head (series 5, image 15, series 8, image 70). Relatively low insertion of the cystic duct. Common hepatic duct is dilated up to 1.0 cm with intrahepatic ductal dilatation, similar to prior examination (series 3, image 17).  Pancreas: Unremarkable. No pancreatic ductal dilatation or surrounding inflammatory changes.  Spleen: Normal in size without significant abnormality.  Adrenals/Urinary Tract: Adrenal glands are  unremarkable. Kidneys are normal, without renal calculi, solid lesion, or hydronephrosis.  Stomach/Bowel: Stomach is within normal limits. Periampullary descending duodenal diverticulum. No evidence of bowel wall thickening, distention, or inflammatory changes.  Vascular/Lymphatic: Aortic atherosclerosis. No enlarged abdominal lymph nodes.  Other: No abdominal wall hernia or abnormality. No ascites.  Musculoskeletal: No acute or significant osseous findings.  IMPRESSION: 1. Numerous gallstones contracted in the gallbladder or a gallbladder remnant. No gallbladder wall thickening. 2. Multiple small gallstones and gravel throughout the cystic duct as well as within the central common hepatic duct and common bile duct within the pancreatic head. Calculi measure up to 0.5 cm. 3. Common hepatic duct is dilated up to 1.0 cm with intrahepatic ductal dilatation, similar to prior examination. 4. Periampullary descending duodenal diverticulum.  Aortic Atherosclerosis (ICD10-I70.0).  Electronically Signed   By: Jearld Lesch M.D.   On: 10/27/2023 14:05 MR ABDOMEN MRCP W WO CONTAST CLINICAL DATA:  Cholelithiasis, choledocholithiasis suspected by prior CT  EXAM: MRI ABDOMEN WITHOUT AND WITH CONTRAST (INCLUDING MRCP)  TECHNIQUE: Multiplanar multisequence MR imaging of the abdomen was performed both before and after the administration of intravenous contrast. Heavily T2-weighted images of the biliary and pancreatic ducts were obtained, and three-dimensional MRCP images were rendered by post processing.  CONTRAST:  10mL GADAVIST GADOBUTROL 1 MMOL/ML IV SOLN  COMPARISON:  CT abdomen pelvis, 10/20/2023  FINDINGS: Lower chest: No acute abnormality.  Hepatobiliary: No solid liver abnormality is seen. Numerous gallstones contracted in the gallbladder or a gallbladder remnant (series 5, image 14). No gallbladder wall thickening. Multiple small gallstones and gravel throughout the  cystic duct measuring up to 0.5 cm, as well as within the central common hepatic duct and common bile duct within the pancreatic head (series 5, image 15, series 8, image 70). Relatively low insertion of the cystic duct. Common hepatic duct is dilated up to 1.0 cm with intrahepatic ductal dilatation, similar to prior examination (series 3, image 17).  Pancreas: Unremarkable. No pancreatic ductal dilatation or surrounding inflammatory changes.  Spleen: Normal in size without significant abnormality.  Adrenals/Urinary Tract: Adrenal glands are unremarkable. Kidneys are normal, without renal calculi, solid lesion, or hydronephrosis.  Stomach/Bowel: Stomach is within normal limits. Periampullary descending duodenal diverticulum. No evidence of bowel wall thickening, distention, or inflammatory changes.  Vascular/Lymphatic: Aortic atherosclerosis. No enlarged abdominal lymph nodes.  Other: No abdominal wall hernia or abnormality. No ascites.  Musculoskeletal: No acute or significant osseous findings.  IMPRESSION: 1. Numerous gallstones contracted in the gallbladder or a gallbladder remnant. No gallbladder wall thickening. 2. Multiple small gallstones and gravel throughout the cystic duct as well as within the central common hepatic duct and common bile duct within the pancreatic head. Calculi measure up to 0.5 cm. 3. Common hepatic duct is dilated up to 1.0 cm with intrahepatic ductal dilatation, similar to prior examination. 4. Periampullary descending duodenal diverticulum.  Aortic Atherosclerosis (ICD10-I70.0).  Electronically Signed   By: Jearld Lesch M.D.   On: 10/27/2023 14:05   Previous GI Evaluations  Surgical [P], ascending, transverse, sigmoid, descending, polyp (5) - TUBULAR ADENOMA(S) WITHOUT HIGH-GRADE DYSPLASIA OR MALIGNANCY - HYPERPLASTIC POLYP   Past Medical History:  Diagnosis Date   Arthritis    back   Cataract    bilateral sx   Diabetes mellitus  without complication (HCC)    on meds   GERD (gastroesophageal reflux disease)    on meds   Hyperlipidemia    on meds   Hypertension    on meds   Internal hemorrhoids    Tubulovillous adenoma of colon 2009   Vitamin D deficiency     Past Surgical History:  Procedure Laterality Date   CATARACT EXTRACTION, BILATERAL Bilateral    COLONOSCOPY  12/01/2013   COLONOSCOPY  2020   MS-MAC-suprep(good)-tics/hems/HPPx1/TAx4   COLONOSCOPY  04/22/2022   HEMORRHOID SURGERY  1978   POLYPECTOMY  2020   HPPx1/TAx4   UPPER GASTROINTESTINAL ENDOSCOPY     WISDOM TOOTH EXTRACTION      Family History  Problem Relation Age of Onset   Diabetes Mother    Hypertension Mother    Stroke Mother 40   Diabetes Father    Hypertension Father    Heart attack Father 43   Colon cancer Neg Hx    Colon polyps Neg Hx    Esophageal cancer Neg Hx    Stomach cancer Neg Hx    Rectal cancer Neg Hx     Prior to Admission medications   Medication Sig Start Date End Date Taking? Authorizing Provider  Accu-Chek Softclix Lancets lancets USE 1 LANCET TO CHECK GLUCOSE 4 TIMES DAILY 01/28/22   Daphine Deutscher, Mary-Margaret, FNP  amLODipine (NORVASC) 10 MG tablet Take 1 tablet (10 mg total) by mouth daily. 06/12/23   Daphine Deutscher, Mary-Margaret, FNP  aspirin EC 81 MG tablet Take 81 mg by mouth daily.    [provider]  benazepril (LOTENSIN) 40 MG tablet Take 1 tablet (40 mg total) by mouth daily. 06/12/23   Daphine Deutscher Mary-Margaret, FNP  cholecalciferol (VITAMIN D) 1000 UNITS tablet Take 2,000 Units by mouth daily.     [provider]  fish oil-omega-3 fatty acids 1000 MG capsule Take 1 g by mouth daily.    [provider]  glucose blood (ACCU-CHEK AVIVA PLUS) test strip CHECK GLUCOSE 4 TIMES DAILY Dx E11.9 08/30/21   Daphine Deutscher, Mary-Margaret, FNP  hydrochlorothiazide (HYDRODIURIL) 25 MG tablet Take 1 tablet (25 mg total) by mouth daily. 06/12/23   Daphine Deutscher, Mary-Margaret, FNP  ibuprofen (ADVIL) 400 MG tablet Take 1  tablet (400 mg total) by mouth every 6 (six) hours as needed. 12/06/18   Eber Hong, MD  metFORMIN (GLUCOPHAGE) 1000 MG tablet Take 1 tablet (1,000 mg total) by mouth 2 (two) times daily with a meal. (NEEDS TO BE SEEN BEFORE NEXT REFILL) 06/12/23   Daphine Deutscher, Mary-Margaret, FNP  omeprazole (PRILOSEC) 40 MG capsule Take 1 capsule (40 mg total) by mouth daily. 06/12/23   Daphine Deutscher, Mary-Margaret, FNP  rosuvastatin (CRESTOR) 10 MG tablet Take 1 tablet (10 mg total) by mouth daily. 06/12/23   Bennie Pierini, FNP    Current Facility-Administered Medications  Medication Dose Route Frequency Provider Last Rate Last Admin   0.9 % NaCl with KCl 40 mEq / L  infusion   Intravenous Continuous Emokpae, Ejiroghene E, MD 75 mL/hr at 10/27/23 2222 New Bag at 10/27/23 2222   acetaminophen (TYLENOL) tablet 650 mg  650 mg Oral Q6H PRN Emokpae, Ejiroghene E, MD       Or   acetaminophen (TYLENOL) suppository 650  mg  650 mg Rectal Q6H PRN Emokpae, Ejiroghene E, MD       amLODipine (NORVASC) tablet 10 mg  10 mg Oral Daily Emokpae, Ejiroghene E, MD       benazepril (LOTENSIN) tablet 40 mg  40 mg Oral Daily Emokpae, Ejiroghene E, MD       hydrochlorothiazide (HYDRODIURIL) tablet 25 mg  25 mg Oral Daily Emokpae, Ejiroghene E, MD       insulin aspart (novoLOG) injection 0-9 Units  0-9 Units Subcutaneous Q4H Emokpae, Ejiroghene E, MD   2 Units at 10/28/23 0848   ondansetron (ZOFRAN) injection 4 mg  4 mg Intravenous Once Roxan Hockey, John K, PA-C       ondansetron [facility]) tablet 4 mg  4 mg Oral Q6H PRN Emokpae, Ejiroghene E, MD       Or   ondansetron (ZOFRAN) injection 4 mg  4 mg Intravenous Q6H PRN Emokpae, Ejiroghene E, MD       pantoprazole (PROTONIX) EC tablet 40 mg  40 mg Oral Daily Emokpae, Ejiroghene E, MD       polyethylene glycol (MIRALAX / GLYCOLAX) packet 17 g  17 g Oral Daily PRN Emokpae, Ejiroghene E, MD        Allergies as of 10/27/2023   (No Known Allergies)    Social History   Socioeconomic History    Marital status: Single    Spouse name: Not on file   Number of children: 0   Years of education: 12   Highest education level: 12th grade  Occupational History   Occupation: Retired    Associate Professor: [employer]  Tobacco Use   Smoking status: Former    Current packs/day: 0.00    Average packs/day: 2.0 packs/day for 40.0 years (80.0 ttl pk-yrs)    Types: Cigarettes    Start date: 11/09/1965    Quit date: 11/09/2005    Years since quitting: 17.9   Smokeless tobacco: Never  Vaping Use   Vaping status: Never Used  Substance and Sexual Activity   Alcohol use: No   Drug use: No   Sexual activity: Not Currently  Other Topics Concern   Not on file  Social History Narrative   Lives alone. No children    [place] benefits   Social Drivers of Health   Financial Resource Strain: Low Risk  (09/07/2023)   Overall Financial Resource Strain (CARDIA)    Difficulty of Paying Living Expenses: Not hard at all  Food Insecurity: No Food Insecurity (09/07/2023)   Hunger Vital Sign    Worried About Running Out of Food in the Last Year: Never true    Ran Out of Food in the Last Year: Never true  Transportation Needs: No Transportation Needs (09/07/2023)   PRAPARE - Administrator, Civil Service (Medical): No    Lack of Transportation (Non-Medical): No  Physical Activity: Sufficiently Active (09/07/2023)   Exercise Vital Sign    Days of Exercise per Week: 5 days    Minutes of Exercise per Session: 30 min  Stress: No Stress Concern Present (09/07/2023)   [employer] of Occupational Health - Occupational Stress Questionnaire    Feeling of Stress : Not at all  Social Connections: Moderately Integrated (09/07/2023)   Social Connection and Isolation Panel [NHANES]    Frequency of Communication with Friends and Family: More than three times a week    Frequency of Social Gatherings with Friends and Family: Three times a week    Attends Religious Services: More than  4 times per year    Active  Member of Clubs or Organizations: Yes    Attends Banker Meetings: More than 4 times per year    Marital Status: Divorced  Intimate Partner Violence: Not At Risk (09/07/2023)   Humiliation, Afraid, Rape, and Kick questionnaire    Fear of Current or Ex-Partner: No    Emotionally Abused: No    Physically Abused: No    Sexually Abused: No     Code Status   Code Status: Full Code  Review of Systems: All systems reviewed and negative except where noted in HPI.  Physical Exam: Vital signs in last 24 hours: Temp:  [97.6 F (36.4 C)-98.4 F (36.9 C)] 97.6 F (36.4 C) (03/19 0816) Pulse Rate:  [59-71] 59 (03/19 0816) Resp:  [16-18] 18 (03/19 0816) BP: (127-157)/(65-76) 157/65 (03/19 0816) SpO2:  [94 %-97 %] 95 % (03/19 0816) Weight:  [91.6 kg] 91.6 kg (03/18 0959)    General:  Pleasant male in NAD Psych:  Cooperative. Normal mood and affect Eyes: Pupils equal Ears:  Normal auditory acuity Nose: No deformity, discharge or lesions Neck:  Supple, no masses felt Lungs:  Clear to auscultation.  Heart:  Regular rate, regular rhythm.  Abdomen:  Soft, nondistended, nontender, active bowel sounds, no masses felt Rectal :  Deferred Msk: Symmetrical without gross deformities.  Neurologic:  Alert, oriented, grossly normal neurologically Extremities : No edema Skin:  Intact without significant lesions.    Intake/Output from previous day: No intake/output data recorded. Intake/Output this shift:  No intake/output data recorded.   Willette Cluster, NP-C   10/28/2023, 8:53 AM

## 2023-10-28 NOTE — Plan of Care (Signed)

## 2023-10-28 NOTE — Anesthesia Procedure Notes (Signed)
 Procedure Name: Intubation Date/Time: 10/28/2023 3:06 PM  Performed by: Randon Goldsmith, CRNAPre-anesthesia Checklist: Patient identified, Emergency Drugs available, Suction available and Patient being monitored Patient Re-evaluated:Patient Re-evaluated prior to induction Oxygen Delivery Method: Circle system utilized Preoxygenation: Pre-oxygenation with 100% oxygen Induction Type: IV induction and Cricoid Pressure applied Ventilation: Mask ventilation without difficulty Laryngoscope Size: Mac and 4 Grade View: Grade I Tube type: Oral Tube size: 7.0 mm Number of attempts: 1 Airway Equipment and Method: Stylet and Oral airway Placement Confirmation: ETT inserted through vocal cords under direct vision, positive ETCO2 and breath sounds checked- equal and bilateral Secured at: 23 cm Tube secured with: Tape Dental Injury: Teeth and Oropharynx as per pre-operative assessment

## 2023-10-28 NOTE — Consult Note (Cosign Needed Addendum)
 Jorge Garcia Jan 27, 1946  [id number].    Requesting MD: Renford Dills, MD Chief Complaint/Reason for Consult: choledocholithiasis   HPI:  Jorge Garcia is a 78 y/o M who presented to [facility] from [facility] after he was diagnosed with choledocholithiasis.   The patient denies abdominal pain but reports occasional nausea/vomiting over the last month. Additional symptoms include fatigue and being more tired than usual. His significant other at bedside adds that he has had 2 episodes of fever, night sweats, body shakes. On 3/7 He saw his PCP and fasting glucose was elevated which prompted an infectious workup and prescription for azithromycin and Augmentin. Abdominal U/S showed gallstones and CT scan of the abdomen 3/11 showed cholelithiasis, possible chronic cholecystitis, and choledocholithiasis. He was referred to general surgery, Dr. Robyne Peers, and seen by her 3/18 before she directed the patient to the ED for blood work and MRCP, warning the patient that confirmation of choledocholithiasis would prompt admission for ERCP.  Workup in the ER revealed elevated LFTs ( AST 94, ALT 240, Alk phos 409, bili 2.5), lipase 104, WBC 8.0, and MRCP showing a contracted gallbladder with gallstones, without signs of cholecystitis, as well and gallstones in the cystic duct, common hepatic duct, and common bile duct. There is a duodenal diverticulum. There is intrahepatic ductal dilatation. The patient is undergoing ERCP today and general surgery is consulted for cholecystectomy.   Patients PMH significant for GERD, HTN, HLD, DM2 (A1c 06/2023 was 7.2). he denies any significant cardiac or pulmonary history Denies use of blood thinners. Denies any abdominal surgeries.  He is a former smoker (quit 2007) who denies tobacco, alcohol, drug use at present.   ROS: Review of Systems  All other systems reviewed and are negative.   Family History  Problem Relation Age of Onset   Diabetes Mother     Hypertension Mother    Stroke Mother 35   Diabetes Father    Hypertension Father    Heart attack Father 49   Colon cancer Neg Hx    Colon polyps Neg Hx    Esophageal cancer Neg Hx    Stomach cancer Neg Hx    Rectal cancer Neg Hx     Past Medical History:  Diagnosis Date   Arthritis    back   Cataract    bilateral sx   Diabetes mellitus without complication (HCC)    on meds   GERD (gastroesophageal reflux disease)    on meds   Hyperlipidemia    on meds   Hypertension    on meds   Internal hemorrhoids    Tubulovillous adenoma of colon 2009   Vitamin D deficiency     Past Surgical History:  Procedure Laterality Date   CATARACT EXTRACTION, BILATERAL Bilateral    COLONOSCOPY  12/01/2013   COLONOSCOPY  2020   MS-MAC-suprep(good)-tics/hems/HPPx1/TAx4   COLONOSCOPY  04/22/2022   HEMORRHOID SURGERY  1978   POLYPECTOMY  2020   HPPx1/TAx4   UPPER GASTROINTESTINAL ENDOSCOPY     WISDOM TOOTH EXTRACTION      Social History:  reports that he quit smoking about 17 years ago. His smoking use included cigarettes. He started smoking about 58 years ago. He has a 80 pack-year smoking history. He has never used smokeless tobacco. He reports that he does not drink alcohol and does not use drugs.  Allergies: No Known Allergies  Facility-Administered Medications Prior to Admission  Medication Dose Route Frequency Provider Last Rate Last Admin   cyanocobalamin (VITAMIN B12)  injection 1,000 mcg  1,000 mcg Intramuscular Q30 days Bennie Pierini, FNP   1,000 mcg at 10/14/23 1052   Medications Prior to Admission  Medication Sig Dispense Refill   Accu-Chek Softclix Lancets lancets USE 1 LANCET TO CHECK GLUCOSE 4 TIMES DAILY 400 each 10   amLODipine (NORVASC) 10 MG tablet Take 1 tablet (10 mg total) by mouth daily. 90 tablet 1   aspirin EC 81 MG tablet Take 81 mg by mouth daily.     benazepril (LOTENSIN) 40 MG tablet Take 1 tablet (40 mg total) by mouth daily. 90 tablet 1    cholecalciferol (VITAMIN D) 1000 UNITS tablet Take 2,000 Units by mouth daily.      fish oil-omega-3 fatty acids 1000 MG capsule Take 1 g by mouth daily.     glucose blood (ACCU-CHEK AVIVA PLUS) test strip CHECK GLUCOSE 4 TIMES DAILY Dx E11.9 400 each 3   hydrochlorothiazide (HYDRODIURIL) 25 MG tablet Take 1 tablet (25 mg total) by mouth daily. 90 tablet 1   ibuprofen (ADVIL) 400 MG tablet Take 1 tablet (400 mg total) by mouth every 6 (six) hours as needed. 30 tablet 0   metFORMIN (GLUCOPHAGE) 1000 MG tablet Take 1 tablet (1,000 mg total) by mouth 2 (two) times daily with a meal. (NEEDS TO BE SEEN BEFORE NEXT REFILL) 180 tablet 1   omeprazole (PRILOSEC) 40 MG capsule Take 1 capsule (40 mg total) by mouth daily. 90 capsule 1   rosuvastatin (CRESTOR) 10 MG tablet Take 1 tablet (10 mg total) by mouth daily. 90 tablet 1     Physical Exam: Blood pressure (!) 157/65, pulse (!) 59, temperature 97.6 F (36.4 C), temperature source Oral, resp. rate 18, height 6\' 1"  (1.854 m), weight 91.6 kg, SpO2 95%. General: Pleasant white male laying on hospital bed, appears younger than stated age, NAD. HEENT: head -normocephalic, atraumatic; Eyes: PERRLA, no conjunctival injection, anicteric sclerae Neck- Trachea is midline, no thyromegaly or JVD appreciated.  CV- RRR, normal S1/S2, no M/R/G, No lower extremity edema  Pulm- breathing is non-labored. On room air. Abd- soft, NT/ND, no masses, hernias, or organomegaly. GU- deferred  MSK- UE/LE symmetrical, no cyanosis, clubbing, or edema.  Neuro- CN II-XII grossly in tact, no paresthesias. Psych- Alert and Oriented x3 with appropriate affect Skin: warm and dry, no rashes or lesions   Results for orders placed or performed during the hospital encounter of 10/27/23 (from the past 48 hours)  CBC with Differential     Status: Abnormal   Collection Time: 10/27/23 11:25 AM  Result Value Ref Range   WBC 8.0 4.0 - 10.5 K/uL   RBC 4.58 4.22 - 5.81 MIL/uL    Hemoglobin 12.6 (L) 13.0 - 17.0 g/dL   HCT 40.9 (L) 81.1 - 91.4 %   MCV 84.1 80.0 - 100.0 fL   MCH 27.5 26.0 - 34.0 pg   MCHC 32.7 30.0 - 36.0 g/dL   RDW 78.2 95.6 - 21.3 %   Platelets 281 150 - 400 K/uL   nRBC 0.0 0.0 - 0.2 %   Neutrophils Relative % 65 %   Neutro Abs 5.2 1.7 - 7.7 K/uL   Lymphocytes Relative 23 %   Lymphs Abs 1.9 0.7 - 4.0 K/uL   Monocytes Relative 9 %   Monocytes Absolute 0.7 0.1 - 1.0 K/uL   Eosinophils Relative 2 %   Eosinophils Absolute 0.2 0.0 - 0.5 K/uL   Basophils Relative 1 %   Basophils Absolute 0.0 0.0 - 0.1 K/uL  Immature Granulocytes 0 %   Abs Immature Granulocytes 0.02 0.00 - 0.07 K/uL    Comment: Performed at [facility], [address] [place], [place] [address]  Comprehensive metabolic panel     Status: Abnormal   Collection Time: 10/27/23 11:25 AM  Result Value Ref Range   Sodium 135 135 - 145 mmol/L   Potassium 3.3 (L) 3.5 - 5.1 mmol/L   Chloride 101 98 - 111 mmol/L   CO2 21 (L) 22 - 32 mmol/L   Glucose, Bld 156 (H) 70 - 99 mg/dL    Comment: Glucose reference range applies only to samples taken after fasting for at least 8 hours.   BUN 32 (H) 8 - 23 mg/dL   Creatinine, Ser 0.34 0.61 - 1.24 mg/dL   Calcium 9.4 8.9 - 74.2 mg/dL   Total Protein 7.5 6.5 - 8.1 g/dL   Albumin 3.7 3.5 - 5.0 g/dL   AST 94 (H) 15 - 41 U/L   ALT 240 (H) 0 - 44 U/L   Alkaline Phosphatase 409 (H) 38 - 126 U/L   Total Bilirubin 2.5 (H) 0.0 - 1.2 mg/dL   GFR, Estimated >59 >56 mL/min    Comment: (NOTE) Calculated using the CKD-EPI Creatinine Equation (2021)    Anion gap 13 5 - 15    Comment: Performed at [facility], [address], [place], [place] [address]  Lipase, blood     Status: Abnormal   Collection Time: 10/27/23 11:25 AM  Result Value Ref Range   Lipase 104 (H) 11 - 51 U/L    Comment: Performed at [facility], [address], [place], [place] [address]  Glucose, capillary     Status: Abnormal   Collection Time: 10/28/23  3:55 AM  Result Value Ref  Range   Glucose-Capillary 192 (H) 70 - 99 mg/dL    Comment: Glucose reference range applies only to samples taken after fasting for at least 8 hours.   Comment 1 Notify RN   CBC     Status: Abnormal   Collection Time: 10/28/23  4:27 AM  Result Value Ref Range   WBC 8.4 4.0 - 10.5 K/uL   RBC 4.32 4.22 - 5.81 MIL/uL   Hemoglobin 11.7 (L) 13.0 - 17.0 g/dL   HCT 51.8 (L) 84.1 - 66.0 %   MCV 82.4 80.0 - 100.0 fL   MCH 27.1 26.0 - 34.0 pg   MCHC 32.9 30.0 - 36.0 g/dL   RDW 63.0 16.0 - 10.9 %   Platelets 269 150 - 400 K/uL   nRBC 0.0 0.0 - 0.2 %    Comment: Performed at [facility], [address], [place], [place] [address]  Comprehensive metabolic panel     Status: Abnormal   Collection Time: 10/28/23  4:27 AM  Result Value Ref Range   Sodium 136 135 - 145 mmol/L   Potassium 3.5 3.5 - 5.1 mmol/L   Chloride 103 98 - 111 mmol/L   CO2 21 (L) 22 - 32 mmol/L   Glucose, Bld 195 (H) 70 - 99 mg/dL    Comment: Glucose reference range applies only to samples taken after fasting for at least 8 hours.   BUN 23 8 - 23 mg/dL   Creatinine, Ser 7.32 0.61 - 1.24 mg/dL   Calcium 9.1 8.9 - 20.2 mg/dL   Total Protein 6.2 (L) 6.5 - 8.1 g/dL   Albumin 2.9 (L) 3.5 - 5.0 g/dL   AST 95 (H) 15 - 41 U/L  ALT 203 (H) 0 - 44 U/L   Alkaline Phosphatase 334 (H) 38 - 126 U/L   Total Bilirubin 1.8 (H) 0.0 - 1.2 mg/dL   GFR, Estimated >57 >84 mL/min    Comment: (NOTE) Calculated using the CKD-EPI Creatinine Equation (2021)    Anion gap 12 5 - 15    Comment: Performed at [facility], [address], [place], [place] [address]  Glucose, capillary     Status: Abnormal   Collection Time: 10/28/23  8:15 AM  Result Value Ref Range   Glucose-Capillary 169 (H) 70 - 99 mg/dL    Comment: Glucose reference range applies only to samples taken after fasting for at least 8 hours.   MR ABDOMEN MRCP W WO CONTAST Result Date: 10/27/2023 CLINICAL DATA:  Cholelithiasis, choledocholithiasis suspected by prior  CT EXAM: MRI ABDOMEN WITHOUT AND WITH CONTRAST (INCLUDING MRCP) TECHNIQUE: Multiplanar multisequence MR imaging of the abdomen was performed both before and after the administration of intravenous contrast. Heavily T2-weighted images of the biliary and pancreatic ducts were obtained, and three-dimensional MRCP images were rendered by post processing. CONTRAST:  10mL GADAVIST GADOBUTROL 1 MMOL/ML IV SOLN COMPARISON:  CT abdomen pelvis, 10/20/2023 FINDINGS: Lower chest: No acute abnormality. Hepatobiliary: No solid liver abnormality is seen. Numerous gallstones contracted in the gallbladder or a gallbladder remnant (series 5, image 14). No gallbladder wall thickening. Multiple small gallstones and gravel throughout the cystic duct measuring up to 0.5 cm, as well as within the central common hepatic duct and common bile duct within the pancreatic head (series 5, image 15, series 8, image 70). Relatively low insertion of the cystic duct. Common hepatic duct is dilated up to 1.0 cm with intrahepatic ductal dilatation, similar to prior examination (series 3, image 17). Pancreas: Unremarkable. No pancreatic ductal dilatation or surrounding inflammatory changes. Spleen: Normal in size without significant abnormality. Adrenals/Urinary Tract: Adrenal glands are unremarkable. Kidneys are normal, without renal calculi, solid lesion, or hydronephrosis. Stomach/Bowel: Stomach is within normal limits. Periampullary descending duodenal diverticulum. No evidence of bowel wall thickening, distention, or inflammatory changes. Vascular/Lymphatic: Aortic atherosclerosis. No enlarged abdominal lymph nodes. Other: No abdominal wall hernia or abnormality. No ascites. Musculoskeletal: No acute or significant osseous findings. IMPRESSION: 1. Numerous gallstones contracted in the gallbladder or a gallbladder remnant. No gallbladder wall thickening. 2. Multiple small gallstones and gravel throughout the cystic duct as well as within the  central common hepatic duct and common bile duct within the pancreatic head. Calculi measure up to 0.5 cm. 3. Common hepatic duct is dilated up to 1.0 cm with intrahepatic ductal dilatation, similar to prior examination. 4. Periampullary descending duodenal diverticulum. Aortic Atherosclerosis (ICD10-I70.0). Electronically Signed   By: Jearld Lesch M.D.   On: 10/27/2023 14:05   MR 3D Recon At Scanner Result Date: 10/27/2023 CLINICAL DATA:  Cholelithiasis, choledocholithiasis suspected by prior CT EXAM: MRI ABDOMEN WITHOUT AND WITH CONTRAST (INCLUDING MRCP) TECHNIQUE: Multiplanar multisequence MR imaging of the abdomen was performed both before and after the administration of intravenous contrast. Heavily T2-weighted images of the biliary and pancreatic ducts were obtained, and three-dimensional MRCP images were rendered by post processing. CONTRAST:  10mL GADAVIST GADOBUTROL 1 MMOL/ML IV SOLN COMPARISON:  CT abdomen pelvis, 10/20/2023 FINDINGS: Lower chest: No acute abnormality. Hepatobiliary: No solid liver abnormality is seen. Numerous gallstones contracted in the gallbladder or a gallbladder remnant (series 5, image 14). No gallbladder wall thickening. Multiple small gallstones and gravel throughout the cystic duct measuring up to 0.5 cm, as well  as within the central common hepatic duct and common bile duct within the pancreatic head (series 5, image 15, series 8, image 70). Relatively low insertion of the cystic duct. Common hepatic duct is dilated up to 1.0 cm with intrahepatic ductal dilatation, similar to prior examination (series 3, image 17). Pancreas: Unremarkable. No pancreatic ductal dilatation or surrounding inflammatory changes. Spleen: Normal in size without significant abnormality. Adrenals/Urinary Tract: Adrenal glands are unremarkable. Kidneys are normal, without renal calculi, solid lesion, or hydronephrosis. Stomach/Bowel: Stomach is within normal limits. Periampullary descending duodenal  diverticulum. No evidence of bowel wall thickening, distention, or inflammatory changes. Vascular/Lymphatic: Aortic atherosclerosis. No enlarged abdominal lymph nodes. Other: No abdominal wall hernia or abnormality. No ascites. Musculoskeletal: No acute or significant osseous findings. IMPRESSION: 1. Numerous gallstones contracted in the gallbladder or a gallbladder remnant. No gallbladder wall thickening. 2. Multiple small gallstones and gravel throughout the cystic duct as well as within the central common hepatic duct and common bile duct within the pancreatic head. Calculi measure up to 0.5 cm. 3. Common hepatic duct is dilated up to 1.0 cm with intrahepatic ductal dilatation, similar to prior examination. 4. Periampullary descending duodenal diverticulum. Aortic Atherosclerosis (ICD10-I70.0). Electronically Signed   By: Jearld Lesch M.D.   On: 10/27/2023 14:05       Latest Ref Rng & Units 10/28/2023    4:27 AM 10/27/2023   11:25 AM 10/19/2023   11:02 AM  Hepatic Function  Total Protein 6.5 - 8.1 g/dL 6.2  7.5  7.0   Albumin 3.5 - 5.0 g/dL 2.9  3.7  4.2   AST 15 - 41 U/L 95  94  41   ALT 0 - 44 U/L 203  240  78   Alk Phosphatase 38 - 126 U/L 334  409  469   Total Bilirubin 0.0 - 1.2 mg/dL 1.8  2.5  1.2   Bilirubin, Direct 0.00 - 0.40 mg/dL   8.29      Assessment/Plan 78 y/o M who presents with one month of intermittent nausea/vomiting, found to have choledocholithiasis and likely chronic cholecystitis. - AFVSS, WBC WNL - LFTs as above consistent with choledocholithiasis - will follow results of ERCP - patient would benefit from laparoscopic cholecystectomy this admission. Tentative plan for laparoscopic cholecystectomy tomorrow 3/20.    FEN - NPO, IVF; ok for CLD after ERCP from [facility] standpoint, NPO after MN VTE - SCD's,  ID - none currently Admit - TR[facility] service   DM2 HTN HLD  I reviewed nursing notes, Consultant GI, general surgery outpatient notes, hospitalist notes, last 24 h  vitals and pain scores, last 48 h intake and output, last 24 h labs and trends, and last 24 h imaging results.  Adam Phenix, [facility] Surgery 10/28/2023, 10:23 AM Please see Amion for pager number during day hours 7:00am-4:30pm or 7:00am -11:30am on weekends

## 2023-10-28 NOTE — Progress Notes (Signed)
 PROGRESS NOTE  Dailan Pfalzgraf  WUJ:[id number] DOB: 1945-09-29 DOA: 10/27/2023 PCP: Bennie Pierini, FNP   Brief Narrative: Patient is a 78 year old male with history of diabetes type 2, hypertension, GERD, cholelithiasis who was admitted after outpatient workup showed elevated liver function test, findings of choledocholithiasis, intrahepatic biliary duct dilatation.  On presentation, patient is hemodynamically stable.  Lab work showed elevated liver enzymes, lipase of 104.  MRCP showed numerous gallstones contracted in the gallbladder, no gallbladder wall thickening, a stone in the CBD, dilation of common hepatic duct to 1 cm.  Gastroenterology following, plan for ERCP. We will consult  general surgery as well  Assessment & Plan:  Principal Problem:   Choledocholithiasis Active Problems:   Hypertension   Hyperlipidemia associated with type 2 diabetes mellitus (HCC)   Diabetes mellitus treated with oral medication (HCC)   Choledocholithiasis: Presented with abnormal liver function test.  MRCP as above.  No abdominal pain, nausea or vomiting.  Admitted for ERCP.  GI following.  Continue gentle IV fluids. He was planned for elective cholecystectomy at any pain.  Will consult general surgery as well.  Diabetes type 2: Recent A1c of 7.8.  On metformin at home.  Currently on sliding scale  Hyperlipidemia: On Crestor.  Currently on hold due to elevated liver enzymes  Hypertension: On Norvasc, hydrochlorothiazide,benazipril at home.  Blood pressure stable        DVT prophylaxis:SCDs Start: 10/27/23 2130     Code Status: Full Code  Family Communication: Discussed with family member at bedside on 3/19  Patient status:Inpatient  Patient is from :Home  Anticipated discharge NF:AOZH  Estimated DC date: After full workup   Consultants: GI, general surgery  Procedures: None yet  Antimicrobials:  Anti-infectives (From admission, onward)    None        Subjective: Patient seen and examined at bedside today.  Hemodynamically stable.  Lying in bed.  Comfortable, no abdomen, nausea or vomiting  Objective: Vitals:   10/27/23 1858 10/27/23 1943 10/28/23 0019 10/28/23 0356  BP: (!) 146/67 (!) 152/68 (!) 142/73 137/75  Pulse: 71 69 67 65  Resp:  18 18 18   Temp: 98.4 F (36.9 C) 97.9 F (36.6 C) 97.8 F (36.6 C) 98.3 F (36.8 C)  TempSrc:      SpO2: 94% 96% 97% 95%  Weight:      Height:       No intake or output data in the 24 hours ending 10/28/23 0813 Filed Weights   10/27/23 0959  Weight: 91.6 kg    Examination:  General exam: Overall comfortable, not in distress HEENT: PERRL Respiratory system:  no wheezes or crackles  Cardiovascular system: S1 & S2 heard, RRR.  Gastrointestinal system: Abdomen is nondistended, soft and nontender. Central nervous system: Alert and oriented Extremities: No edema, no clubbing ,no cyanosis Skin: No rashes, no ulcers,no icterus     Data Reviewed: I have personally reviewed following labs and imaging studies  CBC: Recent Labs  Lab 10/27/23 1125 10/28/23 0427  WBC 8.0 8.4  NEUTROABS 5.2  --   HGB 12.6* 11.7*  HCT 38.5* 35.6*  MCV 84.1 82.4  PLT 281 269   Basic Metabolic Panel: Recent Labs  Lab 10/27/23 1125 10/28/23 0427  NA 135 136  K 3.3* 3.5  CL 101 103  CO2 21* 21*  GLUCOSE 156* 195*  BUN 32* 23  CREATININE 1.18 0.91  CALCIUM 9.4 9.1     No results found for this or any previous visit (from the  past 240 hours).   Radiology Studies: MR ABDOMEN MRCP W WO CONTAST Result Date: 10/27/2023 CLINICAL DATA:  Cholelithiasis, choledocholithiasis suspected by prior CT EXAM: MRI ABDOMEN WITHOUT AND WITH CONTRAST (INCLUDING MRCP) TECHNIQUE: Multiplanar multisequence MR imaging of the abdomen was performed both before and after the administration of intravenous contrast. Heavily T2-weighted images of the biliary and pancreatic ducts were obtained, and three-dimensional MRCP  images were rendered by post processing. CONTRAST:  10mL GADAVIST GADOBUTROL 1 MMOL/ML IV SOLN COMPARISON:  CT abdomen pelvis, 10/20/2023 FINDINGS: Lower chest: No acute abnormality. Hepatobiliary: No solid liver abnormality is seen. Numerous gallstones contracted in the gallbladder or a gallbladder remnant (series 5, image 14). No gallbladder wall thickening. Multiple small gallstones and gravel throughout the cystic duct measuring up to 0.5 cm, as well as within the central common hepatic duct and common bile duct within the pancreatic head (series 5, image 15, series 8, image 70). Relatively low insertion of the cystic duct. Common hepatic duct is dilated up to 1.0 cm with intrahepatic ductal dilatation, similar to prior examination (series 3, image 17). Pancreas: Unremarkable. No pancreatic ductal dilatation or surrounding inflammatory changes. Spleen: Normal in size without significant abnormality. Adrenals/Urinary Tract: Adrenal glands are unremarkable. Kidneys are normal, without renal calculi, solid lesion, or hydronephrosis. Stomach/Bowel: Stomach is within normal limits. Periampullary descending duodenal diverticulum. No evidence of bowel wall thickening, distention, or inflammatory changes. Vascular/Lymphatic: Aortic atherosclerosis. No enlarged abdominal lymph nodes. Other: No abdominal wall hernia or abnormality. No ascites. Musculoskeletal: No acute or significant osseous findings. IMPRESSION: 1. Numerous gallstones contracted in the gallbladder or a gallbladder remnant. No gallbladder wall thickening. 2. Multiple small gallstones and gravel throughout the cystic duct as well as within the central common hepatic duct and common bile duct within the pancreatic head. Calculi measure up to 0.5 cm. 3. Common hepatic duct is dilated up to 1.0 cm with intrahepatic ductal dilatation, similar to prior examination. 4. Periampullary descending duodenal diverticulum. Aortic Atherosclerosis (ICD10-I70.0).  Electronically Signed   By: Jearld Lesch M.D.   On: 10/27/2023 14:05   MR 3D Recon At Scanner Result Date: 10/27/2023 CLINICAL DATA:  Cholelithiasis, choledocholithiasis suspected by prior CT EXAM: MRI ABDOMEN WITHOUT AND WITH CONTRAST (INCLUDING MRCP) TECHNIQUE: Multiplanar multisequence MR imaging of the abdomen was performed both before and after the administration of intravenous contrast. Heavily T2-weighted images of the biliary and pancreatic ducts were obtained, and three-dimensional MRCP images were rendered by post processing. CONTRAST:  10mL GADAVIST GADOBUTROL 1 MMOL/ML IV SOLN COMPARISON:  CT abdomen pelvis, 10/20/2023 FINDINGS: Lower chest: No acute abnormality. Hepatobiliary: No solid liver abnormality is seen. Numerous gallstones contracted in the gallbladder or a gallbladder remnant (series 5, image 14). No gallbladder wall thickening. Multiple small gallstones and gravel throughout the cystic duct measuring up to 0.5 cm, as well as within the central common hepatic duct and common bile duct within the pancreatic head (series 5, image 15, series 8, image 70). Relatively low insertion of the cystic duct. Common hepatic duct is dilated up to 1.0 cm with intrahepatic ductal dilatation, similar to prior examination (series 3, image 17). Pancreas: Unremarkable. No pancreatic ductal dilatation or surrounding inflammatory changes. Spleen: Normal in size without significant abnormality. Adrenals/Urinary Tract: Adrenal glands are unremarkable. Kidneys are normal, without renal calculi, solid lesion, or hydronephrosis. Stomach/Bowel: Stomach is within normal limits. Periampullary descending duodenal diverticulum. No evidence of bowel wall thickening, distention, or inflammatory changes. Vascular/Lymphatic: Aortic atherosclerosis. No enlarged abdominal lymph nodes. Other: No abdominal wall hernia  or abnormality. No ascites. Musculoskeletal: No acute or significant osseous findings. IMPRESSION: 1. Numerous  gallstones contracted in the gallbladder or a gallbladder remnant. No gallbladder wall thickening. 2. Multiple small gallstones and gravel throughout the cystic duct as well as within the central common hepatic duct and common bile duct within the pancreatic head. Calculi measure up to 0.5 cm. 3. Common hepatic duct is dilated up to 1.0 cm with intrahepatic ductal dilatation, similar to prior examination. 4. Periampullary descending duodenal diverticulum. Aortic Atherosclerosis (ICD10-I70.0). Electronically Signed   By: Jearld Lesch M.D.   On: 10/27/2023 14:05    Scheduled Meds:  amLODipine  10 mg Oral Daily   benazepril  40 mg Oral Daily   hydrochlorothiazide  25 mg Oral Daily   insulin aspart  0-9 Units Subcutaneous Q4H   ondansetron (ZOFRAN) IV  4 mg Intravenous Once   pantoprazole  40 mg Oral Daily   Continuous Infusions:  0.9 % NaCl with KCl 40 mEq / L 75 mL/hr at 10/27/23 2222     LOS: 1 day   Burnadette Pop, MD [facility] P3/19/2025, 8:13 AM

## 2023-10-28 NOTE — H&P (View-Only) (Signed)
 Consultation Note   Referring Provider:  [facility] PCP: Bennie Pierini, FNP Primary Gastroenterologist::  Claudette Head, MD      Reason for Consultation: Choledocholithiasis DOA: 10/27/2023         Hospital Day: 2   ASSESSMENT    78 y.o. year old male with cholelithiasis and choledocholithiasis.   History of colon polyps ( Tubular adenomas) Only one subcentimeter TA removed at time of surveillance colonoscopy in 2023.   GERD Manages with Omeprazole as needed  Hypertension Emphysema DM2 Prostamegaly See PMH for additional history     PLAN:   --Plan is for ERCP today at 3 PM with Dr. Leone Payor. The benefits and risks of ERCP with possible sphincterotomy not limited to cardiopulmonary complications of sedation, bleeding, infection, perforation,and pancreatitis were discussed with the patient who agrees to proceed.   --Will need cholecystectomy following removal of bile duct stone  HPI   78 year old male with cholelithiasis.  He was seen outpatient by surgery yesterday for possible chronic cholecystitis as well as choledocholithiasis.  Surgery sent him to [facility] ED for MRCP for MRCP which did confirm cholelithiasis and gallstones in the cystic duct, common hepatic duct and common bile duct.  His liver chemistries were elevated, predominantly cholestatic pattern.  ED at [facility] contacted [place] about transfer for ERCP.  The MRCP was reviewed by our biliary endoscopist Dr. Leone Payor.  Patient was transferred to [facility] overnight for ERCP to be done today. Not having any abdominal pain. Had had some recent nausea. Found to have cholelithiasis and choledocholithiasis during a workup for "an infection" per wife. Never really did have abdominal pain .   Labs and Imaging:  Recent Labs    10/27/23 1125 10/28/23 0427  WBC 8.0 8.4  HGB 12.6* 11.7*  HCT 38.5* 35.6*  MCV 84.1 82.4  PLT 281 269   No results for input(s):  "FOLATE", "VITAMINB12", "FERRITIN", "TIBC", "IRONPCTSAT" in the last 72 hours. Recent Labs    10/27/23 1125 10/28/23 0427  NA 135 136  K 3.3* 3.5  CL 101 103  CO2 21* 21*  GLUCOSE 156* 195*  BUN 32* 23  CREATININE 1.18 0.91  CALCIUM 9.4 9.1   Recent Labs    10/27/23 1125 10/28/23 0427  PROT 7.5 6.2*  ALBUMIN 3.7 2.9*  AST 94* 95*  ALT 240* 203*  ALKPHOS 409* 334*  BILITOT 2.5* 1.8*   No results for input(s): "INR" in the last 72 hours. No results for input(s): "AFPTUMOR" in the last 72 hours.  MR 3D Recon At Scanner CLINICAL DATA:  Cholelithiasis, choledocholithiasis suspected by prior CT  EXAM: MRI ABDOMEN WITHOUT AND WITH CONTRAST (INCLUDING MRCP)  TECHNIQUE: Multiplanar multisequence MR imaging of the abdomen was performed both before and after the administration of intravenous contrast. Heavily T2-weighted images of the biliary and pancreatic ducts were obtained, and three-dimensional MRCP images were rendered by post processing.  CONTRAST:  10mL GADAVIST GADOBUTROL 1 MMOL/ML IV SOLN  COMPARISON:  CT abdomen pelvis, 10/20/2023  FINDINGS: Lower chest: No acute abnormality.  Hepatobiliary: No solid liver abnormality is seen. Numerous gallstones contracted in the gallbladder or a gallbladder remnant (series 5, image 14). No gallbladder wall thickening. Multiple small gallstones and gravel throughout the cystic  duct measuring up to 0.5 cm, as well as within the central common hepatic duct and common bile duct within the pancreatic head (series 5, image 15, series 8, image 70). Relatively low insertion of the cystic duct. Common hepatic duct is dilated up to 1.0 cm with intrahepatic ductal dilatation, similar to prior examination (series 3, image 17).  Pancreas: Unremarkable. No pancreatic ductal dilatation or surrounding inflammatory changes.  Spleen: Normal in size without significant abnormality.  Adrenals/Urinary Tract: Adrenal glands are  unremarkable. Kidneys are normal, without renal calculi, solid lesion, or hydronephrosis.  Stomach/Bowel: Stomach is within normal limits. Periampullary descending duodenal diverticulum. No evidence of bowel wall thickening, distention, or inflammatory changes.  Vascular/Lymphatic: Aortic atherosclerosis. No enlarged abdominal lymph nodes.  Other: No abdominal wall hernia or abnormality. No ascites.  Musculoskeletal: No acute or significant osseous findings.  IMPRESSION: 1. Numerous gallstones contracted in the gallbladder or a gallbladder remnant. No gallbladder wall thickening. 2. Multiple small gallstones and gravel throughout the cystic duct as well as within the central common hepatic duct and common bile duct within the pancreatic head. Calculi measure up to 0.5 cm. 3. Common hepatic duct is dilated up to 1.0 cm with intrahepatic ductal dilatation, similar to prior examination. 4. Periampullary descending duodenal diverticulum.  Aortic Atherosclerosis (ICD10-I70.0).  Electronically Signed   By: Jearld Lesch M.D.   On: 10/27/2023 14:05 MR ABDOMEN MRCP W WO CONTAST CLINICAL DATA:  Cholelithiasis, choledocholithiasis suspected by prior CT  EXAM: MRI ABDOMEN WITHOUT AND WITH CONTRAST (INCLUDING MRCP)  TECHNIQUE: Multiplanar multisequence MR imaging of the abdomen was performed both before and after the administration of intravenous contrast. Heavily T2-weighted images of the biliary and pancreatic ducts were obtained, and three-dimensional MRCP images were rendered by post processing.  CONTRAST:  10mL GADAVIST GADOBUTROL 1 MMOL/ML IV SOLN  COMPARISON:  CT abdomen pelvis, 10/20/2023  FINDINGS: Lower chest: No acute abnormality.  Hepatobiliary: No solid liver abnormality is seen. Numerous gallstones contracted in the gallbladder or a gallbladder remnant (series 5, image 14). No gallbladder wall thickening. Multiple small gallstones and gravel throughout the  cystic duct measuring up to 0.5 cm, as well as within the central common hepatic duct and common bile duct within the pancreatic head (series 5, image 15, series 8, image 70). Relatively low insertion of the cystic duct. Common hepatic duct is dilated up to 1.0 cm with intrahepatic ductal dilatation, similar to prior examination (series 3, image 17).  Pancreas: Unremarkable. No pancreatic ductal dilatation or surrounding inflammatory changes.  Spleen: Normal in size without significant abnormality.  Adrenals/Urinary Tract: Adrenal glands are unremarkable. Kidneys are normal, without renal calculi, solid lesion, or hydronephrosis.  Stomach/Bowel: Stomach is within normal limits. Periampullary descending duodenal diverticulum. No evidence of bowel wall thickening, distention, or inflammatory changes.  Vascular/Lymphatic: Aortic atherosclerosis. No enlarged abdominal lymph nodes.  Other: No abdominal wall hernia or abnormality. No ascites.  Musculoskeletal: No acute or significant osseous findings.  IMPRESSION: 1. Numerous gallstones contracted in the gallbladder or a gallbladder remnant. No gallbladder wall thickening. 2. Multiple small gallstones and gravel throughout the cystic duct as well as within the central common hepatic duct and common bile duct within the pancreatic head. Calculi measure up to 0.5 cm. 3. Common hepatic duct is dilated up to 1.0 cm with intrahepatic ductal dilatation, similar to prior examination. 4. Periampullary descending duodenal diverticulum.  Aortic Atherosclerosis (ICD10-I70.0).  Electronically Signed   By: Jearld Lesch M.D.   On: 10/27/2023 14:05   Previous GI Evaluations  Surgical [P], ascending, transverse, sigmoid, descending, polyp (5) - TUBULAR ADENOMA(S) WITHOUT HIGH-GRADE DYSPLASIA OR MALIGNANCY - HYPERPLASTIC POLYP   Past Medical History:  Diagnosis Date   Arthritis    back   Cataract    bilateral sx   Diabetes mellitus  without complication (HCC)    on meds   GERD (gastroesophageal reflux disease)    on meds   Hyperlipidemia    on meds   Hypertension    on meds   Internal hemorrhoids    Tubulovillous adenoma of colon 2009   Vitamin D deficiency     Past Surgical History:  Procedure Laterality Date   CATARACT EXTRACTION, BILATERAL Bilateral    COLONOSCOPY  12/01/2013   COLONOSCOPY  2020   MS-MAC-suprep(good)-tics/hems/HPPx1/TAx4   COLONOSCOPY  04/22/2022   HEMORRHOID SURGERY  1978   POLYPECTOMY  2020   HPPx1/TAx4   UPPER GASTROINTESTINAL ENDOSCOPY     WISDOM TOOTH EXTRACTION      Family History  Problem Relation Age of Onset   Diabetes Mother    Hypertension Mother    Stroke Mother 40   Diabetes Father    Hypertension Father    Heart attack Father 43   Colon cancer Neg Hx    Colon polyps Neg Hx    Esophageal cancer Neg Hx    Stomach cancer Neg Hx    Rectal cancer Neg Hx     Prior to Admission medications   Medication Sig Start Date End Date Taking? Authorizing Provider  Accu-Chek Softclix Lancets lancets USE 1 LANCET TO CHECK GLUCOSE 4 TIMES DAILY 01/28/22   Daphine Deutscher, Mary-Margaret, FNP  amLODipine (NORVASC) 10 MG tablet Take 1 tablet (10 mg total) by mouth daily. 06/12/23   Daphine Deutscher, Mary-Margaret, FNP  aspirin EC 81 MG tablet Take 81 mg by mouth daily.    [provider]  benazepril (LOTENSIN) 40 MG tablet Take 1 tablet (40 mg total) by mouth daily. 06/12/23   Daphine Deutscher Mary-Margaret, FNP  cholecalciferol (VITAMIN D) 1000 UNITS tablet Take 2,000 Units by mouth daily.     [provider]  fish oil-omega-3 fatty acids 1000 MG capsule Take 1 g by mouth daily.    [provider]  glucose blood (ACCU-CHEK AVIVA PLUS) test strip CHECK GLUCOSE 4 TIMES DAILY Dx E11.9 08/30/21   Daphine Deutscher, Mary-Margaret, FNP  hydrochlorothiazide (HYDRODIURIL) 25 MG tablet Take 1 tablet (25 mg total) by mouth daily. 06/12/23   Daphine Deutscher, Mary-Margaret, FNP  ibuprofen (ADVIL) 400 MG tablet Take 1  tablet (400 mg total) by mouth every 6 (six) hours as needed. 12/06/18   Eber Hong, MD  metFORMIN (GLUCOPHAGE) 1000 MG tablet Take 1 tablet (1,000 mg total) by mouth 2 (two) times daily with a meal. (NEEDS TO BE SEEN BEFORE NEXT REFILL) 06/12/23   Daphine Deutscher, Mary-Margaret, FNP  omeprazole (PRILOSEC) 40 MG capsule Take 1 capsule (40 mg total) by mouth daily. 06/12/23   Daphine Deutscher, Mary-Margaret, FNP  rosuvastatin (CRESTOR) 10 MG tablet Take 1 tablet (10 mg total) by mouth daily. 06/12/23   Bennie Pierini, FNP    Current Facility-Administered Medications  Medication Dose Route Frequency Provider Last Rate Last Admin   0.9 % NaCl with KCl 40 mEq / L  infusion   Intravenous Continuous Emokpae, Ejiroghene E, MD 75 mL/hr at 10/27/23 2222 New Bag at 10/27/23 2222   acetaminophen (TYLENOL) tablet 650 mg  650 mg Oral Q6H PRN Emokpae, Ejiroghene E, MD       Or   acetaminophen (TYLENOL) suppository 650  mg  650 mg Rectal Q6H PRN Emokpae, Ejiroghene E, MD       amLODipine (NORVASC) tablet 10 mg  10 mg Oral Daily Emokpae, Ejiroghene E, MD       benazepril (LOTENSIN) tablet 40 mg  40 mg Oral Daily Emokpae, Ejiroghene E, MD       hydrochlorothiazide (HYDRODIURIL) tablet 25 mg  25 mg Oral Daily Emokpae, Ejiroghene E, MD       insulin aspart (novoLOG) injection 0-9 Units  0-9 Units Subcutaneous Q4H Emokpae, Ejiroghene E, MD   2 Units at 10/28/23 0848   ondansetron (ZOFRAN) injection 4 mg  4 mg Intravenous Once Roxan Hockey, John K, PA-C       ondansetron [facility]) tablet 4 mg  4 mg Oral Q6H PRN Emokpae, Ejiroghene E, MD       Or   ondansetron (ZOFRAN) injection 4 mg  4 mg Intravenous Q6H PRN Emokpae, Ejiroghene E, MD       pantoprazole (PROTONIX) EC tablet 40 mg  40 mg Oral Daily Emokpae, Ejiroghene E, MD       polyethylene glycol (MIRALAX / GLYCOLAX) packet 17 g  17 g Oral Daily PRN Emokpae, Ejiroghene E, MD        Allergies as of 10/27/2023   (No Known Allergies)    Social History   Socioeconomic History    Marital status: Single    Spouse name: Not on file   Number of children: 0   Years of education: 12   Highest education level: 12th grade  Occupational History   Occupation: Retired    Associate Professor: [employer]  Tobacco Use   Smoking status: Former    Current packs/day: 0.00    Average packs/day: 2.0 packs/day for 40.0 years (80.0 ttl pk-yrs)    Types: Cigarettes    Start date: 11/09/1965    Quit date: 11/09/2005    Years since quitting: 17.9   Smokeless tobacco: Never  Vaping Use   Vaping status: Never Used  Substance and Sexual Activity   Alcohol use: No   Drug use: No   Sexual activity: Not Currently  Other Topics Concern   Not on file  Social History Narrative   Lives alone. No children    [place] benefits   Social Drivers of Health   Financial Resource Strain: Low Risk  (09/07/2023)   Overall Financial Resource Strain (CARDIA)    Difficulty of Paying Living Expenses: Not hard at all  Food Insecurity: No Food Insecurity (09/07/2023)   Hunger Vital Sign    Worried About Running Out of Food in the Last Year: Never true    Ran Out of Food in the Last Year: Never true  Transportation Needs: No Transportation Needs (09/07/2023)   PRAPARE - Administrator, Civil Service (Medical): No    Lack of Transportation (Non-Medical): No  Physical Activity: Sufficiently Active (09/07/2023)   Exercise Vital Sign    Days of Exercise per Week: 5 days    Minutes of Exercise per Session: 30 min  Stress: No Stress Concern Present (09/07/2023)   [employer] of Occupational Health - Occupational Stress Questionnaire    Feeling of Stress : Not at all  Social Connections: Moderately Integrated (09/07/2023)   Social Connection and Isolation Panel [NHANES]    Frequency of Communication with Friends and Family: More than three times a week    Frequency of Social Gatherings with Friends and Family: Three times a week    Attends Religious Services: More than  4 times per year    Active  Member of Clubs or Organizations: Yes    Attends Banker Meetings: More than 4 times per year    Marital Status: Divorced  Intimate Partner Violence: Not At Risk (09/07/2023)   Humiliation, Afraid, Rape, and Kick questionnaire    Fear of Current or Ex-Partner: No    Emotionally Abused: No    Physically Abused: No    Sexually Abused: No     Code Status   Code Status: Full Code  Review of Systems: All systems reviewed and negative except where noted in HPI.  Physical Exam: Vital signs in last 24 hours: Temp:  [97.6 F (36.4 C)-98.4 F (36.9 C)] 97.6 F (36.4 C) (03/19 0816) Pulse Rate:  [59-71] 59 (03/19 0816) Resp:  [16-18] 18 (03/19 0816) BP: (127-157)/(65-76) 157/65 (03/19 0816) SpO2:  [94 %-97 %] 95 % (03/19 0816) Weight:  [91.6 kg] 91.6 kg (03/18 0959)    General:  Pleasant male in NAD Psych:  Cooperative. Normal mood and affect Eyes: Pupils equal Ears:  Normal auditory acuity Nose: No deformity, discharge or lesions Neck:  Supple, no masses felt Lungs:  Clear to auscultation.  Heart:  Regular rate, regular rhythm.  Abdomen:  Soft, nondistended, nontender, active bowel sounds, no masses felt Rectal :  Deferred Msk: Symmetrical without gross deformities.  Neurologic:  Alert, oriented, grossly normal neurologically Extremities : No edema Skin:  Intact without significant lesions.    Intake/Output from previous day: No intake/output data recorded. Intake/Output this shift:  No intake/output data recorded.   Willette Cluster, NP-C   10/28/2023, 8:53 AM

## 2023-10-28 NOTE — Transfer of Care (Signed)
 Immediate Anesthesia Transfer of Care Note  Patient: Jorge Garcia  Procedure(s) Performed: ERCP, WITH INTERVENTION IF INDICATED EGD (ESOPHAGOGASTRODUODENOSCOPY) Balloon dilation wire-guided BIOPSY, STOMACH AND ESOPHAGUS INSERTION, STENT, PANCREATIC DUCT  Patient Location: PACU  Anesthesia Type:General  Level of Consciousness: awake, alert , and oriented  Airway & Oxygen Therapy: Patient Spontanous Breathing  Post-op Assessment: Report given to RN and Post -op Vital signs reviewed and stable  Post vital signs: Reviewed and stable  Last Vitals:  Vitals Value Taken Time  BP 128/52 10/28/23 1635  Temp 36.6 C 10/28/23 1635  Pulse 59 10/28/23 1635  Resp 16 10/28/23 1635  SpO2 94 % 10/28/23 1635    Last Pain:  Vitals:   10/28/23 1635  TempSrc: Temporal  PainSc: 0-No pain         Complications: No notable events documented.

## 2023-10-28 NOTE — Anesthesia Preprocedure Evaluation (Addendum)
 Anesthesia Evaluation  Patient identified by MRN, date of birth, ID band Patient awake    Reviewed: Allergy & Precautions, NPO status , Patient's Chart, lab work & pertinent test results  Airway Mallampati: III  TM Distance: >3 FB Neck ROM: Full    Dental  (+) Dental Advisory Given, Teeth Intact   Pulmonary former smoker   Pulmonary exam normal breath sounds clear to auscultation       Cardiovascular hypertension, Pt. on medications  Rhythm:Regular Rate:Normal + Systolic murmurs    Neuro/Psych negative neurological ROS     GI/Hepatic Neg liver ROS,GERD  Medicated and Controlled,,  Endo/Other  diabetes    Renal/GU negative Renal ROS     Musculoskeletal  (+) Arthritis ,    Abdominal   Peds  Hematology negative hematology ROS (+)   Anesthesia Other Findings   Reproductive/Obstetrics                             Anesthesia Physical Anesthesia Plan  ASA: 3  Anesthesia Plan: General   Post-op Pain Management: Minimal or no pain anticipated   Induction: Intravenous  PONV Risk Score and Plan: 2 and Ondansetron, Dexamethasone and Treatment may vary due to age or medical condition  Airway Management Planned: Oral ETT  Additional Equipment:   Intra-op Plan:   Post-operative Plan: Extubation in OR  Informed Consent: I have reviewed the patients History and Physical, chart, labs and discussed the procedure including the risks, benefits and alternatives for the proposed anesthesia with the patient or authorized representative who has indicated his/her understanding and acceptance.     Dental advisory given  Plan Discussed with: CRNA  Anesthesia Plan Comments:        Anesthesia Quick Evaluation

## 2023-10-28 NOTE — Anesthesia Postprocedure Evaluation (Signed)
 Anesthesia Post Note  Patient: Jorge Garcia  Procedure(s) Performed: ERCP, WITH INTERVENTION IF INDICATED EGD (ESOPHAGOGASTRODUODENOSCOPY) Balloon dilation wire-guided BIOPSY, STOMACH AND ESOPHAGUS INSERTION, STENT, PANCREATIC DUCT     Patient location during evaluation: PACU Anesthesia Type: General Level of consciousness: awake and alert Pain management: pain level controlled Vital Signs Assessment: post-procedure vital signs reviewed and stable Respiratory status: spontaneous breathing, nonlabored ventilation, respiratory function stable and patient connected to nasal cannula oxygen Cardiovascular status: blood pressure returned to baseline and stable Postop Assessment: no apparent nausea or vomiting Anesthetic complications: no   No notable events documented.  Last Vitals:  Vitals:   10/28/23 1700 10/28/23 1716  BP: 124/65 123/62  Pulse: (!) 58 (!) 56  Resp: 15   Temp:  36.7 C  SpO2: 93% 96%    Last Pain:  Vitals:   10/28/23 1700  TempSrc:   PainSc: 0-No pain                 Royale Swamy S

## 2023-10-29 ENCOUNTER — Inpatient Hospital Stay (HOSPITAL_COMMUNITY)

## 2023-10-29 ENCOUNTER — Encounter (HOSPITAL_COMMUNITY): Payer: Self-pay | Admitting: Internal Medicine

## 2023-10-29 ENCOUNTER — Encounter (HOSPITAL_COMMUNITY)
Admission: EM | Disposition: A | Discharge status: Home / Self Care | Payer: Self-pay | Source: Ambulatory Visit | Attending: Internal Medicine

## 2023-10-29 DIAGNOSIS — K8050 Calculus of bile duct without cholangitis or cholecystitis without obstruction: Secondary | ICD-10-CM

## 2023-10-29 HISTORY — PX: CHOLECYSTECTOMY: SHX55

## 2023-10-29 LAB — COMPREHENSIVE METABOLIC PANEL
ALT: 170 U/L — ABNORMAL HIGH (ref 0–44)
AST: 59 U/L — ABNORMAL HIGH (ref 15–41)
Albumin: 3.2 g/dL — ABNORMAL LOW (ref 3.5–5.0)
Alkaline Phosphatase: 353 U/L — ABNORMAL HIGH (ref 38–126)
Anion gap: 9 (ref 5–15)
BUN: 17 mg/dL (ref 8–23)
CO2: 22 mmol/L (ref 22–32)
Calcium: 9.2 mg/dL (ref 8.9–10.3)
Chloride: 105 mmol/L (ref 98–111)
Creatinine, Ser: 0.99 mg/dL (ref 0.61–1.24)
GFR, Estimated: >60 mL/min (ref >60)
Glucose, Bld: 163 mg/dL — ABNORMAL HIGH (ref 70–99)
Potassium: 3.8 mmol/L (ref 3.5–5.1)
Sodium: 136 mmol/L (ref 135–145)
Total Bilirubin: 2.1 mg/dL — ABNORMAL HIGH (ref 0.0–1.2)
Total Protein: 6.9 g/dL (ref 6.5–8.1)

## 2023-10-29 LAB — CBC
HCT: 40.6 % (ref 39.0–52.0)
Hemoglobin: 13.2 g/dL (ref 13.0–17.0)
MCH: 27.1 pg (ref 26.0–34.0)
MCHC: 32.5 g/dL (ref 30.0–36.0)
MCV: 83.4 fL (ref 80.0–100.0)
Platelets: 318 10*3/uL (ref 150–400)
RBC: 4.87 MIL/uL (ref 4.22–5.81)
RDW: 13.6 % (ref 11.5–15.5)
WBC: 9.8 10*3/uL (ref 4.0–10.5)
nRBC: 0 % (ref 0.0–0.2)

## 2023-10-29 LAB — GLUCOSE, CAPILLARY
Glucose-Capillary: 136 mg/dL — ABNORMAL HIGH (ref 70–99)
Glucose-Capillary: 146 mg/dL — ABNORMAL HIGH (ref 70–99)
Glucose-Capillary: 155 mg/dL — ABNORMAL HIGH (ref 70–99)
Glucose-Capillary: 190 mg/dL — ABNORMAL HIGH (ref 70–99)
Glucose-Capillary: 214 mg/dL — ABNORMAL HIGH (ref 70–99)
Glucose-Capillary: 249 mg/dL — ABNORMAL HIGH (ref 70–99)
Glucose-Capillary: 256 mg/dL — ABNORMAL HIGH (ref 70–99)
Glucose-Capillary: 321 mg/dL — ABNORMAL HIGH (ref 70–99)
Glucose-Capillary: 333 mg/dL — ABNORMAL HIGH (ref 70–99)

## 2023-10-29 LAB — SURGICAL PCR SCREEN
MRSA, PCR: NEGATIVE
Staphylococcus aureus: NEGATIVE

## 2023-10-29 SURGERY — LAPAROSCOPIC CHOLECYSTECTOMY WITH INTRAOPERATIVE CHOLANGIOGRAM
Anesthesia: General | Site: Abdomen

## 2023-10-29 MED ORDER — SUGAMMADEX SODIUM 200 MG/2ML IV SOLN
INTRAVENOUS | Status: DC | PRN
  Administered 2023-10-29: 200 mg via INTRAVENOUS

## 2023-10-29 MED ORDER — LIDOCAINE 2% (20 MG/ML) 5 ML SYRINGE
INTRAMUSCULAR | Status: AC
  Filled 2023-10-29: qty 5

## 2023-10-29 MED ORDER — PROPOFOL 10 MG/ML IV BOLUS
INTRAVENOUS | Status: DC | PRN
  Administered 2023-10-29: 100 mg via INTRAVENOUS

## 2023-10-29 MED ORDER — PHENYLEPHRINE 80 MCG/ML (10ML) SYRINGE FOR IV PUSH (FOR BLOOD PRESSURE SUPPORT)
PREFILLED_SYRINGE | INTRAVENOUS | Status: DC | PRN
Start: 2023-10-29 — End: 2023-10-29
  Administered 2023-10-29: 240 ug via INTRAVENOUS

## 2023-10-29 MED ORDER — PHENYLEPHRINE 80 MCG/ML (10ML) SYRINGE FOR IV PUSH (FOR BLOOD PRESSURE SUPPORT)
PREFILLED_SYRINGE | INTRAVENOUS | Status: AC
  Filled 2023-10-29: qty 10

## 2023-10-29 MED ORDER — ORAL CARE MOUTH RINSE: 15.0000 mL | Freq: Once | OROMUCOSAL | Status: AC

## 2023-10-29 MED ORDER — CHLORHEXIDINE GLUCONATE 0.12 % MT SOLN
OROMUCOSAL | Status: AC
  Administered 2023-10-29: 15 mL via OROMUCOSAL
  Filled 2023-10-29: qty 15

## 2023-10-29 MED ORDER — SUGAMMADEX SODIUM 200 MG/2ML IV SOLN
INTRAVENOUS | Status: AC
  Filled 2023-10-29: qty 2

## 2023-10-29 MED ORDER — EPHEDRINE SULFATE-NACL 50-0.9 MG/10ML-% IV SOSY
PREFILLED_SYRINGE | INTRAVENOUS | Status: DC | PRN
  Administered 2023-10-29 (×2): 10 mg via INTRAVENOUS

## 2023-10-29 MED ORDER — BUPIVACAINE HCL 0.25 % IJ SOLN
INTRAMUSCULAR | Status: DC | PRN
  Administered 2023-10-29: 5 mL

## 2023-10-29 MED ORDER — DEXAMETHASONE SODIUM PHOSPHATE 10 MG/ML IJ SOLN
INTRAMUSCULAR | Status: DC | PRN
  Administered 2023-10-29: 10 mg via INTRAVENOUS

## 2023-10-29 MED ORDER — MIDAZOLAM HCL 2 MG/2ML IJ SOLN
INTRAMUSCULAR | Status: AC
  Filled 2023-10-29: qty 2

## 2023-10-29 MED ORDER — FENTANYL CITRATE PF 50 MCG/ML IJ SOSY
50.0000 ug | PREFILLED_SYRINGE | INTRAMUSCULAR | Status: DC | PRN
  Administered 2023-10-29: 50 ug via INTRAVENOUS
  Filled 2023-10-29: qty 1

## 2023-10-29 MED ORDER — FENTANYL CITRATE (PF) 250 MCG/5ML IJ SOLN
INTRAMUSCULAR | Status: DC | PRN
  Administered 2023-10-29 (×2): 50 ug via INTRAVENOUS

## 2023-10-29 MED ORDER — ROCURONIUM BROMIDE 10 MG/ML (PF) SYRINGE
PREFILLED_SYRINGE | INTRAVENOUS | Status: DC | PRN
  Administered 2023-10-29: 10 mg via INTRAVENOUS
  Administered 2023-10-29: 50 mg via INTRAVENOUS
  Administered 2023-10-29: 10 mg via INTRAVENOUS

## 2023-10-29 MED ORDER — 0.9 % SODIUM CHLORIDE (POUR BTL) OPTIME
TOPICAL | Status: DC | PRN
  Administered 2023-10-29: 1000 mL

## 2023-10-29 MED ORDER — MUPIROCIN 2 % EX OINT
1.0000 | TOPICAL_OINTMENT | Freq: Two times a day (BID) | CUTANEOUS | Status: AC
  Administered 2023-10-29 – 2023-11-02 (×10): 1 via NASAL
  Filled 2023-10-29 (×4): qty 22

## 2023-10-29 MED ORDER — SODIUM CHLORIDE 0.9 % IR SOLN
Status: DC | PRN
  Administered 2023-10-29: 1

## 2023-10-29 MED ORDER — ONDANSETRON HCL 4 MG/2ML IJ SOLN
INTRAMUSCULAR | Status: AC
  Filled 2023-10-29: qty 2

## 2023-10-29 MED ORDER — PHENYLEPHRINE HCL-NACL 20-0.9 MG/250ML-% IV SOLN
INTRAVENOUS | Status: DC | PRN
  Administered 2023-10-29: 50 ug/min via INTRAVENOUS

## 2023-10-29 MED ORDER — ONDANSETRON HCL 4 MG/2ML IJ SOLN
INTRAMUSCULAR | Status: DC | PRN
  Administered 2023-10-29: 4 mg via INTRAVENOUS

## 2023-10-29 MED ORDER — CHLORHEXIDINE GLUCONATE 0.12 % MT SOLN: 15.0000 mL | Freq: Once | OROMUCOSAL | Status: AC

## 2023-10-29 MED ORDER — LIDOCAINE 2% (20 MG/ML) 5 ML SYRINGE
INTRAMUSCULAR | Status: DC | PRN
Start: 2023-10-29 — End: 2023-10-29
  Administered 2023-10-29: 40 mg via INTRAVENOUS

## 2023-10-29 MED ORDER — FENTANYL CITRATE (PF) 250 MCG/5ML IJ SOLN
INTRAMUSCULAR | Status: AC
  Filled 2023-10-29: qty 5

## 2023-10-29 MED ORDER — CEFAZOLIN SODIUM-DEXTROSE 1-4 GM/50ML-% IV SOLN
INTRAVENOUS | Status: DC | PRN
  Administered 2023-10-29: 2 g via INTRAVENOUS

## 2023-10-29 MED ORDER — MIDAZOLAM HCL 2 MG/2ML IJ SOLN
INTRAMUSCULAR | Status: DC | PRN
  Administered 2023-10-29: 2 mg via INTRAVENOUS

## 2023-10-29 MED ORDER — DEXAMETHASONE SODIUM PHOSPHATE 10 MG/ML IJ SOLN
INTRAMUSCULAR | Status: AC
  Filled 2023-10-29: qty 1

## 2023-10-29 MED ORDER — LACTATED RINGERS IV SOLN: INTRAVENOUS | Status: DC

## 2023-10-29 MED ORDER — EPHEDRINE 5 MG/ML INJ
INTRAVENOUS | Status: AC
  Filled 2023-10-29: qty 5

## 2023-10-29 MED ORDER — BUPIVACAINE-EPINEPHRINE (PF) 0.25% -1:200000 IJ SOLN
INTRAMUSCULAR | Status: AC
  Filled 2023-10-29: qty 30

## 2023-10-29 MED ORDER — PROPOFOL 10 MG/ML IV BOLUS
INTRAVENOUS | Status: AC
  Filled 2023-10-29: qty 20

## 2023-10-29 MED ORDER — ROCURONIUM BROMIDE 10 MG/ML (PF) SYRINGE
PREFILLED_SYRINGE | INTRAVENOUS | Status: AC
  Filled 2023-10-29: qty 10

## 2023-10-29 SURGICAL SUPPLY — 48 items
APPLIER CLIP ROT 10 11.4 M/L (STAPLE) ×1 IMPLANT
BAG COUNTER SPONGE SURGICOUNT (BAG) ×2 IMPLANT
BLADE CLIPPER SURG (BLADE) IMPLANT
CANISTER SUCT 3000ML PPV (MISCELLANEOUS) ×2 IMPLANT
CATH CHOLANG 76X19 KUMAR (CATHETERS) IMPLANT
CHLORAPREP W/TINT 26 (MISCELLANEOUS) ×2 IMPLANT
CLIP APPLIE ROT 10 11.4 M/L (STAPLE) IMPLANT
CLIP LIGATING HEMO O LOK GREEN (MISCELLANEOUS) IMPLANT
COVER MAYO STAND STRL (DRAPES) ×2 IMPLANT
COVER SURGICAL LIGHT HANDLE (MISCELLANEOUS) ×2 IMPLANT
DERMABOND ADVANCED .7 DNX12 (GAUZE/BANDAGES/DRESSINGS) ×2 IMPLANT
DRAIN CHANNEL 19F RND (DRAIN) IMPLANT
DRAPE C-ARM 42X120 X-RAY (DRAPES) ×2 IMPLANT
DRSG TEGADERM 4X4.75 (GAUZE/BANDAGES/DRESSINGS) IMPLANT
ELECT REM PT RETURN 9FT ADLT (ELECTROSURGICAL) ×1 IMPLANT
ELECTRODE REM PT RTRN 9FT ADLT (ELECTROSURGICAL) ×2 IMPLANT
ENDOLOOP SUT PDS II 0 18 (SUTURE) IMPLANT
EVACUATOR SILICONE 100CC (DRAIN) IMPLANT
GAUZE SPONGE 4X4 12PLY STRL (GAUZE/BANDAGES/DRESSINGS) IMPLANT
GLOVE BIOGEL PI IND STRL 7.0 (GLOVE) ×2 IMPLANT
GLOVE SURG SS PI 7.0 STRL IVOR (GLOVE) ×2 IMPLANT
GOWN STRL REUS W/ TWL LRG LVL3 (GOWN DISPOSABLE) ×6 IMPLANT
GRASPER SUT TROCAR 14GX15 (MISCELLANEOUS) ×2 IMPLANT
IRRIG SUCT STRYKERFLOW 2 WTIP (MISCELLANEOUS) ×1 IMPLANT
IRRIGATION SUCT STRKRFLW 2 WTP (MISCELLANEOUS) ×2 IMPLANT
KIT BASIN OR (CUSTOM PROCEDURE TRAY) ×2 IMPLANT
KIT IMAGING PINPOINTPAQ (MISCELLANEOUS) IMPLANT
KIT TURNOVER KIT B (KITS) ×2 IMPLANT
NDL 22X1.5 STRL (OR ONLY) (MISCELLANEOUS) ×2 IMPLANT
NEEDLE 22X1.5 STRL (OR ONLY) (MISCELLANEOUS) ×1 IMPLANT
NS IRRIG 1000ML POUR BTL (IV SOLUTION) ×2 IMPLANT
PAD ARMBOARD POSITIONER FOAM (MISCELLANEOUS) ×2 IMPLANT
POUCH RETRIEVAL ECOSAC 10 (ENDOMECHANICALS) ×2 IMPLANT
SCISSORS LAP 5X35 DISP (ENDOMECHANICALS) ×2 IMPLANT
SET CHOLANGIOGRAPH 5 50 .035 (SET/KITS/TRAYS/PACK) ×2 IMPLANT
SET TUBE SMOKE EVAC HIGH FLOW (TUBING) ×2 IMPLANT
SLEEVE Z-THREAD 5X100MM (TROCAR) ×4 IMPLANT
SPECIMEN JAR SMALL (MISCELLANEOUS) ×2 IMPLANT
STOPCOCK 4 WAY LG BORE MALE ST (IV SETS) IMPLANT
SUT ETHILON 2 0 FS 18 (SUTURE) IMPLANT
SUT MNCRL AB 4-0 PS2 18 (SUTURE) ×2 IMPLANT
TOWEL GREEN STERILE (TOWEL DISPOSABLE) ×2 IMPLANT
TOWEL GREEN STERILE FF (TOWEL DISPOSABLE) ×2 IMPLANT
TRAY LAPAROSCOPIC MC (CUSTOM PROCEDURE TRAY) ×2 IMPLANT
TROCAR Z THREAD OPTICAL 12X100 (TROCAR) ×2 IMPLANT
TROCAR Z-THREAD OPTICAL 5X100M (TROCAR) ×2 IMPLANT
WARMER LAPAROSCOPE (MISCELLANEOUS) ×2 IMPLANT
WATER STERILE IRR 1000ML POUR (IV SOLUTION) ×2 IMPLANT

## 2023-10-29 NOTE — Plan of Care (Signed)

## 2023-10-29 NOTE — Transfer of Care (Signed)
 Immediate Anesthesia Transfer of Care Note  Patient: Jorge Garcia  Procedure(s) Performed: LAPAROSCOPIC CHOLECYSTECTOMY WITH INTRAOPERATIVE CHOLANGIOGRAM (Abdomen)  Patient Location: PACU  Anesthesia Type:General  Level of Consciousness: awake, alert , and oriented  Airway & Oxygen Therapy: Patient Spontanous Breathing and Patient connected to nasal cannula oxygen  Post-op Assessment: Report given to RN and Post -op Vital signs reviewed and stable  Post vital signs: Reviewed and stable  Last Vitals:  Vitals Value Taken Time  BP 138/65 10/29/23 1715  Temp 36.7 C 10/29/23 1700  Pulse 93 10/29/23 1718  Resp 18 10/29/23 1718  SpO2 91 % 10/29/23 1718  Vitals shown include unfiled device data.  Last Pain:  Vitals:   10/29/23 1700  TempSrc:   PainSc: 0-No pain         Complications: No notable events documented.

## 2023-10-29 NOTE — Op Note (Signed)
 PATIENT:  Jorge Garcia  78 y.o. male  PRE-OPERATIVE DIAGNOSIS:  choledocholithiasis  POST-OPERATIVE DIAGNOSIS:  choledocholithiasis  PROCEDURE:  Procedure(s): LAPAROSCOPIC CHOLECYSTECTOMY WITH attempted INTRAOPERATIVE CHOLANGIOGRAM   SURGEON:  Aracely Rickett, [place], MD   ASSISTANT: Trixie Deis, [facility]  ANESTHESIA:   local and general  Indications for procedure: [name] is a 78 y.o. male with symptoms of Abdominal pain and Nausea and vomiting consistent with gallbladder disease, Confirmed by ultrasound and MRI. He had attempted ERCP but unable to cannulate. His bilirubin had been stable. I made decision to proceed with lap chole with ioc to see if the stone had passed.  Description of procedure: The patient was brought into the operative suite, placed supine. Anesthesia was administered with endotracheal tube. Patient was strapped in place and foot board was secured. All pressure points were offloaded by foam padding. The patient was prepped and draped in the usual sterile fashion.  A periumbilical incision was made and optical entry was used to enter the abdomen. 2 5 mm trocars were placed on in the right lateral space on in the right subcostal space. A 12mm trocar was placed in the subxiphoid space. Marcaine was infused to the subxiphoid space and lateral upper right abdomen in the transversus abdominis plane. Next the patient was placed in reverse trendelenberg. The gallbladder appeared chronically inflamed. The omentum and duodenum were densely adhered to the gallbladder. Omentum was adhered to the gallbladder and was taken down with cautery/blunt dissection.  The gallbladder was retracted cephalad and lateral. Planes and structures were difficult to identify due to the level of chronic inflammation. Care was taken to bluntly dissect starting laterally. The gallbladder was separated from the liver in a top down approach. This allowed visualization of the infundibulum of the  gallbladder. A kumar cholangiogram was attempted but leaked without showing structures. Next, the gallbladder was divided at the infundibulum and cook catheter was passed beyond the point and the remaining infundibulum was clamped with grasper howere there continued to be a leak and unable to get a chlangiogram showing structures. An endoloop was placed around the base of the infundibulum stump. A 19 Fr blake drain was placed into the fossa and brought out the right lateral port.  The Gallbladder was placed in a specimen bag. The gallbladder fossa was irrigated and hemostasis was applied with cautery. The gallbladder was removed via the 12mm trocar. The fascial defect was closed with interrupted 0 vicryl suture via laparoscopic trans-fascial suture passer and loose stones were removed. Pneumoperitoneum was removed, all trocar were removed. All incisions were closed with 4-0 monocryl subcuticular stitch. The patient woke from anesthesia and was brought to PACU in stable condition. All counts were correct  Findings: dense chronic inflammation of the gallbladder  Specimen: gallbladder  Blood loss: 20 ml  Local anesthesia: 30 ml Marcaine  Complications: none  PLAN OF CARE: Admit to inpatient   PATIENT DISPOSITION:  PACU - hemodynamically stable.  De Blanch [facility], [place]

## 2023-10-29 NOTE — Anesthesia Preprocedure Evaluation (Signed)
 Anesthesia Evaluation  Patient identified by MRN, date of birth, ID band Patient awake    Reviewed: Allergy & Precautions, NPO status , Patient's Chart, lab work & pertinent test results  Airway Mallampati: II  TM Distance: >3 FB Neck ROM: Full    Dental  (+) Teeth Intact, Dental Advisory Given   Pulmonary former smoker   breath sounds clear to auscultation       Cardiovascular hypertension,  Rhythm:Regular Rate:Normal     Neuro/Psych    GI/Hepatic   Endo/Other  diabetes    Renal/GU      Musculoskeletal   Abdominal   Peds  Hematology   Anesthesia Other Findings   Reproductive/Obstetrics                             Anesthesia Physical Anesthesia Plan  ASA: 2  Anesthesia Plan: General   Post-op Pain Management:    Induction: Intravenous  PONV Risk Score and Plan: 3 and Ondansetron and Treatment may vary due to age or medical condition  Airway Management Planned: Oral ETT  Additional Equipment: None  Intra-op Plan:   Post-operative Plan: Extubation in OR  Informed Consent: I have reviewed the patients History and Physical, chart, labs and discussed the procedure including the risks, benefits and alternatives for the proposed anesthesia with the patient or authorized representative who has indicated his/her understanding and acceptance.     Dental advisory given  Plan Discussed with: CRNA  Anesthesia Plan Comments:        Anesthesia Quick Evaluation
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
## 2023-10-29 NOTE — Anesthesia Postprocedure Evaluation (Signed)
 Anesthesia Post Note  Patient: Yoskar Murrillo  Procedure(s) Performed: LAPAROSCOPIC CHOLECYSTECTOMY WITH INTRAOPERATIVE CHOLANGIOGRAM (Abdomen)     Patient location during evaluation: PACU Anesthesia Type: General Level of consciousness: awake and alert Pain management: pain level controlled Vital Signs Assessment: post-procedure vital signs reviewed and stable Respiratory status: spontaneous breathing, nonlabored ventilation, respiratory function stable and patient connected to nasal cannula oxygen Cardiovascular status: blood pressure returned to baseline and stable Postop Assessment: no apparent nausea or vomiting Anesthetic complications: no  No notable events documented.  Last Vitals:  Vitals:   10/29/23 1715 10/29/23 1730  BP: 138/65 (!) 150/66  Pulse: 92 92  Resp: 16 16  Temp:    SpO2: 93% 94%    Last Pain:  Vitals:   10/29/23 1730  TempSrc:   PainSc: 0-No pain                 Shelton Silvas

## 2023-10-29 NOTE — Progress Notes (Signed)
 TR[facility] night cross cover note:   I was notified by RN that this patient, who is status post  laparoscopic cholecystectomy, is experiencing abdominal discomfort refractory to existing order for prn acetaminophen.  I subsequently placed order for fentanyl 50 mcg IV every 2 hours as needed for pain.     Newton Pigg, DO Hospitalist

## 2023-10-29 NOTE — Progress Notes (Signed)
 PROGRESS NOTE  Jaxin Fulfer  ZOX:[id number] DOB: December 01, 1945 DOA: 10/27/2023 PCP: Bennie Pierini, FNP   Brief Narrative: Patient is a 78 year old male with history of diabetes type 2, hypertension, GERD, cholelithiasis who was admitted after outpatient workup showed elevated liver function test, findings of choledocholithiasis, intrahepatic biliary duct dilatation.  On presentation, patient is hemodynamically stable.  Lab work showed elevated liver enzymes, lipase of 104.  MRCP showed numerous gallstones contracted in the gallbladder, no gallbladder wall thickening, a stone in the CBD, dilation of common hepatic duct to 1 cm.  Gastroenterology attempted ERCP on 3/19, procedure was not successful.  Plan for repeat ERCP today.  General surgery planning for cholecystectomy  Assessment & Plan:  Principal Problem:   Choledocholithiasis Active Problems:   Hypertension   Hyperlipidemia associated with type 2 diabetes mellitus (HCC)   Diabetes mellitus treated with oral medication (HCC)   Esophageal stricture   Duodenitis   Gastroesophageal reflux disease with esophagitis without hemorrhage   Choledocholithiasis: Presented with abnormal liver function test.  MRCP as above.  No abdominal pain, nausea or vomiting.  Admitted for ERCP.  GI following.   Gastroenterology attempted ERCP on 3/19, procedure was not successful.  Plan for repeat ERCP today.  General surgery planning for cholecystectomy. Also found to have benign-appearing esophageal stenosis, esophagitis, duodenitis.  Started on PPI. Liver enzymes continue to improve  Diabetes type 2: Recent A1c of 7.8.  On metformin at home.  Currently on sliding scale  Hyperlipidemia: On Crestor.  Currently on hold due to elevated liver enzymes  Hypertension: On Norvasc, hydrochlorothiazide,benazipril at home.  Blood pressure stable        DVT prophylaxis:SCDs Start: 10/27/23 2130     Code Status: Full Code  Family Communication:  Discussed with family member at bedside on 3/20  Patient status:Inpatient  Patient is from :Home  Anticipated discharge WJ:XBJY  Estimated DC date: After full workup   Consultants: GI, general surgery  Procedures: ERCP  Antimicrobials:  Anti-infectives (From admission, onward)    Start     Dose/Rate Route Frequency Ordered Stop   10/28/23 1345  ampicillin-sulbactam (UNASYN) 1.5 g in sodium chloride 0.9 % 100 mL IVPB        1.5 g 200 mL/hr over 30 Minutes Intravenous 60 min pre-op 10/28/23 1132 10/28/23 1532       Subjective: Patient seen and examined at bedside today.  Hemodynamically stable.  Comfortable.  Any new complaints today.  No abdominal pain, nausea or vomiting.  Objective: Vitals:   10/28/23 1716 10/28/23 2034 10/29/23 0505 10/29/23 0757  BP: 123/62 (!) 143/68 126/77 134/71  Pulse: (!) 56 74 60 70  Resp:  18 18   Temp: 98 F (36.7 C) 98.3 F (36.8 C) 98.4 F (36.9 C) 98 F (36.7 C)  TempSrc:  Oral Oral Oral  SpO2: 96% 95% 95% 94%  Weight:      Height:        Intake/Output Summary (Last 24 hours) at 10/29/2023 1116 Last data filed at 10/28/2023 1522 Gross per 24 hour  Intake 100 ml  Output --  Net 100 ml   Filed Weights   10/27/23 0959  Weight: 91.6 kg    Examination:  General exam: Overall comfortable, not in distress HEENT: PERRL Respiratory system:  no wheezes or crackles  Cardiovascular system: S1 & S2 heard, RRR.  Gastrointestinal system: Abdomen is nondistended, soft and nontender. Central nervous system: Alert and oriented Extremities: No edema, no clubbing ,no cyanosis Skin: No rashes, no  ulcers,no icterus    Data Reviewed: I have personally reviewed following labs and imaging studies  CBC: Recent Labs  Lab 10/27/23 1125 10/28/23 0427 10/29/23 0437  WBC 8.0 8.4 9.8  NEUTROABS 5.2  --   --   HGB 12.6* 11.7* 13.2  HCT 38.5* 35.6* 40.6  MCV 84.1 82.4 83.4  PLT 281 269 318   Basic Metabolic Panel: Recent Labs  Lab  10/27/23 1125 10/28/23 0427 10/29/23 0437  NA 135 136 136  K 3.3* 3.5 3.8  CL 101 103 105  CO2 21* 21* 22  GLUCOSE 156* 195* 163*  BUN 32* 23 17  CREATININE 1.18 0.91 0.99  CALCIUM 9.4 9.1 9.2     No results found for this or any previous visit (from the past 240 hours).   Radiology Studies: DG ERCP Result Date: 10/29/2023 CLINICAL DATA:  [id number] Choledocholithiasis [id number] EXAM: ERCP COMPARISON:  CT AP, 10/20/2023. MRCP, 10/27/2023. US Abdomen, 10/19/2023. FLUOROSCOPY: Exposure Index (as provided by the fluoroscopic device): 58.89 mGy Kerma FINDINGS: Limited oblique planar images of the RIGHT upper quadrant obtained C-arm. Images demonstrating flexible endoscopy, biliary duct cannulation, retrograde cholangiogram and pancreatic stent placement. No overt biliary ductal dilation or discrete biliary filling defect is demonstrated. IMPRESSION: Fluoroscopic imaging for ERCP and pancreatic stent placement. For complete description of intra procedural findings, please see performing service dictation. Electronically Signed   By: Roanna Banning M.D.   On: 10/29/2023 07:08   DG C-Arm 1-60 Min-No Report Result Date: 10/28/2023 Fluoroscopy was utilized by the requesting physician.  No radiographic interpretation.   MR ABDOMEN MRCP W WO CONTAST Result Date: 10/27/2023 CLINICAL DATA:  Cholelithiasis, choledocholithiasis suspected by prior CT EXAM: MRI ABDOMEN WITHOUT AND WITH CONTRAST (INCLUDING MRCP) TECHNIQUE: Multiplanar multisequence MR imaging of the abdomen was performed both before and after the administration of intravenous contrast. Heavily T2-weighted images of the biliary and pancreatic ducts were obtained, and three-dimensional MRCP images were rendered by post processing. CONTRAST:  10mL GADAVIST GADOBUTROL 1 MMOL/ML IV SOLN COMPARISON:  CT abdomen pelvis, 10/20/2023 FINDINGS: Lower chest: No acute abnormality. Hepatobiliary: No solid liver abnormality is seen. Numerous gallstones contracted in  the gallbladder or a gallbladder remnant (series 5, image 14). No gallbladder wall thickening. Multiple small gallstones and gravel throughout the cystic duct measuring up to 0.5 cm, as well as within the central common hepatic duct and common bile duct within the pancreatic head (series 5, image 15, series 8, image 70). Relatively low insertion of the cystic duct. Common hepatic duct is dilated up to 1.0 cm with intrahepatic ductal dilatation, similar to prior examination (series 3, image 17). Pancreas: Unremarkable. No pancreatic ductal dilatation or surrounding inflammatory changes. Spleen: Normal in size without significant abnormality. Adrenals/Urinary Tract: Adrenal glands are unremarkable. Kidneys are normal, without renal calculi, solid lesion, or hydronephrosis. Stomach/Bowel: Stomach is within normal limits. Periampullary descending duodenal diverticulum. No evidence of bowel wall thickening, distention, or inflammatory changes. Vascular/Lymphatic: Aortic atherosclerosis. No enlarged abdominal lymph nodes. Other: No abdominal wall hernia or abnormality. No ascites. Musculoskeletal: No acute or significant osseous findings. IMPRESSION: 1. Numerous gallstones contracted in the gallbladder or a gallbladder remnant. No gallbladder wall thickening. 2. Multiple small gallstones and gravel throughout the cystic duct as well as within the central common hepatic duct and common bile duct within the pancreatic head. Calculi measure up to 0.5 cm. 3. Common hepatic duct is dilated up to 1.0 cm with intrahepatic ductal dilatation, similar to prior examination. 4. Periampullary descending duodenal diverticulum. Aortic  Atherosclerosis (ICD10-I70.0). Electronically Signed   By: Jearld Lesch M.D.   On: 10/27/2023 14:05   MR 3D Recon At Scanner Result Date: 10/27/2023 CLINICAL DATA:  Cholelithiasis, choledocholithiasis suspected by prior CT EXAM: MRI ABDOMEN WITHOUT AND WITH CONTRAST (INCLUDING MRCP) TECHNIQUE:  Multiplanar multisequence MR imaging of the abdomen was performed both before and after the administration of intravenous contrast. Heavily T2-weighted images of the biliary and pancreatic ducts were obtained, and three-dimensional MRCP images were rendered by post processing. CONTRAST:  10mL GADAVIST GADOBUTROL 1 MMOL/ML IV SOLN COMPARISON:  CT abdomen pelvis, 10/20/2023 FINDINGS: Lower chest: No acute abnormality. Hepatobiliary: No solid liver abnormality is seen. Numerous gallstones contracted in the gallbladder or a gallbladder remnant (series 5, image 14). No gallbladder wall thickening. Multiple small gallstones and gravel throughout the cystic duct measuring up to 0.5 cm, as well as within the central common hepatic duct and common bile duct within the pancreatic head (series 5, image 15, series 8, image 70). Relatively low insertion of the cystic duct. Common hepatic duct is dilated up to 1.0 cm with intrahepatic ductal dilatation, similar to prior examination (series 3, image 17). Pancreas: Unremarkable. No pancreatic ductal dilatation or surrounding inflammatory changes. Spleen: Normal in size without significant abnormality. Adrenals/Urinary Tract: Adrenal glands are unremarkable. Kidneys are normal, without renal calculi, solid lesion, or hydronephrosis. Stomach/Bowel: Stomach is within normal limits. Periampullary descending duodenal diverticulum. No evidence of bowel wall thickening, distention, or inflammatory changes. Vascular/Lymphatic: Aortic atherosclerosis. No enlarged abdominal lymph nodes. Other: No abdominal wall hernia or abnormality. No ascites. Musculoskeletal: No acute or significant osseous findings. IMPRESSION: 1. Numerous gallstones contracted in the gallbladder or a gallbladder remnant. No gallbladder wall thickening. 2. Multiple small gallstones and gravel throughout the cystic duct as well as within the central common hepatic duct and common bile duct within the pancreatic head.  Calculi measure up to 0.5 cm. 3. Common hepatic duct is dilated up to 1.0 cm with intrahepatic ductal dilatation, similar to prior examination. 4. Periampullary descending duodenal diverticulum. Aortic Atherosclerosis (ICD10-I70.0). Electronically Signed   By: Jearld Lesch M.D.   On: 10/27/2023 14:05    Scheduled Meds:  amLODipine  10 mg Oral Daily   benazepril  40 mg Oral Daily   hydrochlorothiazide  25 mg Oral Daily   indocyanine green  2.5 mg Intravenous On Call to OR   insulin aspart  0-9 Units Subcutaneous Q4H   mupirocin ointment  1 Application Nasal BID   ondansetron (ZOFRAN) IV  4 mg Intravenous Once   pantoprazole  40 mg Oral BID AC   Continuous Infusions:     LOS: 2 days   Burnadette Pop, MD [facility] P3/20/2025, 11:16 AM

## 2023-10-29 NOTE — Anesthesia Procedure Notes (Signed)
 Procedure Name: Intubation Date/Time: 10/29/2023 3:01 PM  Performed by: Georgianne Fick D, CRNAPre-anesthesia Checklist: Patient identified, Emergency Drugs available, Suction available and Patient being monitored Patient Re-evaluated:Patient Re-evaluated prior to induction Oxygen Delivery Method: Circle System Utilized Preoxygenation: Pre-oxygenation with 100% oxygen Induction Type: IV induction Ventilation: Mask ventilation without difficulty Laryngoscope Size: Mac and 4 Grade View: Grade II Tube type: Oral Tube size: 7.5 mm Number of attempts: 2 (Student EMT unable to visulaize cords. DL x 1 by CRNA without complication) Airway Equipment and Method: Stylet and Oral airway Placement Confirmation: ETT inserted through vocal cords under direct vision, positive ETCO2 and breath sounds checked- equal and bilateral Secured at: 22 cm Tube secured with: Tape Dental Injury: Teeth and Oropharynx as per pre-operative assessment

## 2023-10-29 NOTE — Progress Notes (Signed)
  1 Day Post-Op   Chief Complaint/Subjective: No abdominal complaints  Objective: Vital signs in last 24 hours: Temp:  [97.5 F (36.4 C)-98.4 F (36.9 C)] 98 F (36.7 C) (03/20 0757) Pulse Rate:  [56-74] 70 (03/20 0757) Resp:  [14-20] 18 (03/20 0505) BP: (112-145)/(52-77) 134/71 (03/20 0757) SpO2:  [93 %-96 %] 94 % (03/20 0757)   Intake/Output from previous day: 03/19 0701 - 03/20 0700 In: 100 [IV Piggyback:100] Out: -   PE: Gen: NAD Resp: nonlabored Card: RRR Abd: soft, NT  Lab Results:  Recent Labs    10/28/23 0427 10/29/23 0437  WBC 8.4 9.8  HGB 11.7* 13.2  HCT 35.6* 40.6  PLT 269 318   Recent Labs    10/28/23 0427 10/29/23 0437  NA 136 136  K 3.5 3.8  CL 103 105  CO2 21* 22  GLUCOSE 195* 163*  BUN 23 17  CREATININE 0.91 0.99  CALCIUM 9.1 9.2   No results for input(s): "LABPROT", "INR" in the last 72 hours.    Component Value Date/Time   NA 136 10/29/2023 0437   NA 134 10/16/2023 1404   K 3.8 10/29/2023 0437   CL 105 10/29/2023 0437   CO2 22 10/29/2023 0437   GLUCOSE 163 (H) 10/29/2023 0437   BUN 17 10/29/2023 0437   BUN 17 10/16/2023 1404   CREATININE 0.99 10/29/2023 0437   CREATININE 1.05 02/18/2013 0916   CALCIUM 9.2 10/29/2023 0437   PROT 6.9 10/29/2023 0437   PROT 7.0 10/19/2023 1102   ALBUMIN 3.2 (L) 10/29/2023 0437   ALBUMIN 4.2 10/19/2023 1102   AST 59 (H) 10/29/2023 0437   ALT 170 (H) 10/29/2023 0437   ALKPHOS 353 (H) 10/29/2023 0437   BILITOT 2.1 (H) 10/29/2023 0437   BILITOT 1.2 10/19/2023 1102   GFRNONAA >60 10/29/2023 0437   GFRNONAA 75 02/18/2013 0916   GFRAA 79 08/14/2020 1020   GFRAA 86 02/18/2013 0916    Assessment/Plan  s/p Procedure(s): ERCP, WITH INTERVENTION IF INDICATED EGD (ESOPHAGOGASTRODUODENOSCOPY) Balloon dilation wire-guided BIOPSY, STOMACH AND ESOPHAGUS INSERTION, STENT, PANCREATIC DUCT 10/28/2023  -unable to cannulate in ERCP yesterday -t bili stable -will proceed with lap chole with IOC  today  FEN - NPO VTE - SCDs ID - periop Disposition - inpatient   LOS: 2 days   I reviewed last 24 h vitals and pain scores, last 48 h intake and output, last 24 h labs and trends, and last 24 h imaging results.  This care required high  level of medical decision making.   De Blanch [facility] 10/29/2023, 1:27 PM Please see Amion for pager number during day hours 7:00am-4:30pm or 7:00am -11:30am on weekends

## 2023-10-30 ENCOUNTER — Encounter (HOSPITAL_COMMUNITY): Payer: Self-pay | Admitting: General Surgery

## 2023-10-30 DIAGNOSIS — K8050 Calculus of bile duct without cholangitis or cholecystitis without obstruction: Secondary | ICD-10-CM | POA: Diagnosis not present

## 2023-10-30 LAB — COMPREHENSIVE METABOLIC PANEL
ALT: 132 U/L — ABNORMAL HIGH (ref 0–44)
AST: 65 U/L — ABNORMAL HIGH (ref 15–41)
Albumin: 3.1 g/dL — ABNORMAL LOW (ref 3.5–5.0)
Alkaline Phosphatase: 283 U/L — ABNORMAL HIGH (ref 38–126)
Anion gap: 7 (ref 5–15)
BUN: 18 mg/dL (ref 8–23)
CO2: 25 mmol/L (ref 22–32)
Calcium: 8.9 mg/dL (ref 8.9–10.3)
Chloride: 101 mmol/L (ref 98–111)
Creatinine, Ser: 1.22 mg/dL (ref 0.61–1.24)
GFR, Estimated: >60 mL/min (ref >60)
Glucose, Bld: 226 mg/dL — ABNORMAL HIGH (ref 70–99)
Potassium: 4.3 mmol/L (ref 3.5–5.1)
Sodium: 133 mmol/L — ABNORMAL LOW (ref 135–145)
Total Bilirubin: 2.1 mg/dL — ABNORMAL HIGH (ref 0.0–1.2)
Total Protein: 6.6 g/dL (ref 6.5–8.1)

## 2023-10-30 LAB — CBC
HCT: 37.1 % — ABNORMAL LOW (ref 39.0–52.0)
Hemoglobin: 12.2 g/dL — ABNORMAL LOW (ref 13.0–17.0)
MCH: 27.2 pg (ref 26.0–34.0)
MCHC: 32.9 g/dL (ref 30.0–36.0)
MCV: 82.6 fL (ref 80.0–100.0)
Platelets: 299 10*3/uL (ref 150–400)
RBC: 4.49 MIL/uL (ref 4.22–5.81)
RDW: 14.1 % (ref 11.5–15.5)
WBC: 14.1 10*3/uL — ABNORMAL HIGH (ref 4.0–10.5)
nRBC: 0 % (ref 0.0–0.2)

## 2023-10-30 LAB — GLUCOSE, CAPILLARY
Glucose-Capillary: 204 mg/dL — ABNORMAL HIGH (ref 70–99)
Glucose-Capillary: 175 mg/dL — ABNORMAL HIGH (ref 70–99)
Glucose-Capillary: 190 mg/dL — ABNORMAL HIGH (ref 70–99)
Glucose-Capillary: 248 mg/dL — ABNORMAL HIGH (ref 70–99)
Glucose-Capillary: 242 mg/dL — ABNORMAL HIGH (ref 70–99)

## 2023-10-30 LAB — SURGICAL PATHOLOGY

## 2023-10-30 MED ORDER — KETOROLAC TROMETHAMINE 15 MG/ML IJ SOLN: 15.0000 mg | Freq: Four times a day (QID) | INTRAMUSCULAR | Status: DC | PRN

## 2023-10-30 MED ORDER — SODIUM CHLORIDE 0.9 % IV SOLN
2.0000 g | INTRAVENOUS | Status: DC
  Administered 2023-10-30 – 2023-11-02 (×4): 2 g via INTRAVENOUS
  Filled 2023-10-30 (×4): qty 20

## 2023-10-30 MED ORDER — DOCUSATE SODIUM 100 MG PO CAPS
100.0000 mg | ORAL_CAPSULE | Freq: Two times a day (BID) | ORAL | Status: DC
  Administered 2023-10-30 – 2023-11-03 (×8): 100 mg via ORAL
  Filled 2023-10-30 (×8): qty 1

## 2023-10-30 MED ORDER — DIPHENHYDRAMINE HCL 25 MG PO CAPS
25.0000 mg | ORAL_CAPSULE | Freq: Every evening | ORAL | Status: DC | PRN
  Administered 2023-10-30 – 2023-11-02 (×4): 25 mg via ORAL
  Filled 2023-10-30 (×4): qty 1

## 2023-10-30 MED ORDER — HYDRALAZINE HCL 20 MG/ML IJ SOLN
10.0000 mg | Freq: Four times a day (QID) | INTRAMUSCULAR | Status: DC | PRN
  Administered 2023-10-30: 10 mg via INTRAVENOUS
  Filled 2023-10-30: qty 1

## 2023-10-30 MED ORDER — OXYCODONE HCL 5 MG PO TABS
5.0000 mg | ORAL_TABLET | Freq: Four times a day (QID) | ORAL | Status: DC | PRN
  Administered 2023-10-30 – 2023-11-02 (×7): 5 mg via ORAL
  Filled 2023-10-30 (×7): qty 1

## 2023-10-30 MED ORDER — HYDROMORPHONE HCL 1 MG/ML IJ SOLN
0.5000 mg | INTRAMUSCULAR | Status: DC | PRN
  Administered 2023-10-30: 0.5 mg via INTRAVENOUS
  Filled 2023-10-30: qty 0.5

## 2023-10-30 MED ORDER — MELATONIN 3 MG PO TABS
3.0000 mg | ORAL_TABLET | Freq: Every day | ORAL | Status: DC
  Administered 2023-10-30 – 2023-11-02 (×4): 3 mg via ORAL
  Filled 2023-10-30 (×4): qty 1

## 2023-10-30 NOTE — Plan of Care (Signed)

## 2023-10-30 NOTE — Progress Notes (Addendum)
 PROGRESS NOTE  Jorge Garcia  XNA:[id number] DOB: [birth year] DOA: 10/27/2023 PCP: Bennie Pierini, FNP   Brief Narrative: Patient is a 78 year old male with history of diabetes type 2, hypertension, GERD, cholelithiasis who was admitted after outpatient workup showed elevated liver function test, findings of choledocholithiasis, intrahepatic biliary duct dilatation.  On presentation, patient is hemodynamically stable.  Lab work showed elevated liver enzymes, lipase of 104.  MRCP showed numerous gallstones contracted in the gallbladder, no gallbladder wall thickening, a stone in the CBD, dilation of common hepatic duct to 1 cm.  Gastroenterology attempted ERCP on 3/19, procedure was not successful.  General surgery did laparoscopic cholecystectomy on 3/20.  Plan for ERCP on Monday  Assessment & Plan:  Principal Problem:   Choledocholithiasis Active Problems:   Hypertension   Hyperlipidemia associated with type 2 diabetes mellitus (HCC)   Diabetes mellitus treated with oral medication (HCC)   Esophageal stricture   Duodenitis   Gastroesophageal reflux disease with esophagitis without hemorrhage   Choledocholithiasis: Presented with abnormal liver function test.  MRCP as above.  No abdominal pain, nausea or vomiting.  Admitted for ERCP.  GI following.   Gastroenterology attempted ERCP on 3/19, procedure was not successful.   Also found to have benign-appearing esophageal stenosis, esophagitis, duodenitis.  Started on PPI. Liver enzymes continue to improve. Status post laparoscopic cholecystectomy, placement of JP drain on 3/20.  Attempted IOC but was unsuccessful.  Plan for ERCP on Monday  Diabetes type 2: Recent A1c of 7.8.  On metformin at home.  Currently on sliding scale  Hyperlipidemia: On Crestor.  Currently on hold due to elevated liver enzymes  Hypertension: On Norvasc, hydrochlorothiazide,benazipril at home.  Blood pressure stable  Leukocytosis: Most likely reactive.   Check CBC tomorrow        DVT prophylaxis:SCDs Start: 10/27/23 2130     Code Status: Full Code  Family Communication: Discussed with family member at bedside on 3/21  Patient status:Inpatient  Patient is from :Home  Anticipated discharge RK:YHCW  Estimated DC date: After full workup,plan for ERCP on Monday   Consultants: GI, general surgery  Procedures: ERCP  Antimicrobials:  Anti-infectives (From admission, onward)    Start     Dose/Rate Route Frequency Ordered Stop   10/28/23 1345  ampicillin-sulbactam (UNASYN) 1.5 g in sodium chloride 0.9 % 100 mL IVPB        1.5 g 200 mL/hr over 30 Minutes Intravenous 60 min pre-op 10/28/23 1132 10/28/23 1532       Subjective: Patient seen and examined at the bedside today.  Hemodynamically stable this morning.  Had cholecystectomy yesterday.  Complains of some soreness on the surgical site.  No nausea or vomiting  Objective: Vitals:   10/29/23 1800 10/29/23 2053 10/30/23 0522 10/30/23 0743  BP: (!) 147/64 (!) 142/73 125/70 123/64  Pulse: 91 (!) 104 77 78  Resp: 16 18 18 15   Temp: 97.8 F (36.6 C) 98.5 F (36.9 C) 99.1 F (37.3 C) 98.6 F (37 C)  TempSrc: Oral Oral Oral Oral  SpO2: 95% 95% 94% 94%  Weight:      Height:        Intake/Output Summary (Last 24 hours) at 10/30/2023 1128 Last data filed at 10/30/2023 0535 Gross per 24 hour  Intake 600 ml  Output 825 ml  Net -225 ml   Filed Weights   10/27/23 0959 10/29/23 1417  Weight: 91.6 kg 91.6 kg    Examination:  General exam: Overall comfortable, not in distress HEENT: PERRL Respiratory  system:  no wheezes or crackles  Cardiovascular system: S1 & S2 heard, RRR.  Gastrointestinal system: Abdomen is nondistended, soft .  Appropriately tender, surgical wounds Central nervous system: Alert and oriented Extremities: No edema, no clubbing ,no cyanosis Skin: No rashes, no ulcers,no icterus    Data Reviewed: I have personally reviewed following labs and  imaging studies  CBC: Recent Labs  Lab 10/27/23 1125 10/28/23 0427 10/29/23 0437 10/30/23 0441  WBC 8.0 8.4 9.8 14.1*  NEUTROABS 5.2  --   --   --   HGB 12.6* 11.7* 13.2 12.2*  HCT 38.5* 35.6* 40.6 37.1*  MCV 84.1 82.4 83.4 82.6  PLT 281 269 318 299   Basic Metabolic Panel: Recent Labs  Lab 10/27/23 1125 10/28/23 0427 10/29/23 0437 10/30/23 0441  NA 135 136 136 133*  K 3.3* 3.5 3.8 4.3  CL 101 103 105 101  CO2 21* 21* 22 25  GLUCOSE 156* 195* 163* 226*  BUN 32* 23 17 18   CREATININE 1.18 0.91 0.99 1.22  CALCIUM 9.4 9.1 9.2 8.9     Recent Results (from the past 240 hours)  Surgical PCR screen     Status: None   Collection Time: 10/29/23  9:50 AM   Specimen: Nasal Mucosa; Nasal Swab  Result Value Ref Range Status   MRSA, PCR NEGATIVE NEGATIVE Final   Staphylococcus aureus NEGATIVE NEGATIVE Final    Comment: (NOTE) The Xpert SA Assay (FDA approved for NASAL specimens in patients 38 years of age and older), is one component of a comprehensive surveillance program. It is not intended to diagnose infection nor to guide or monitor treatment. Performed at [facility], [address] [place], [place] [address]      Radiology Studies: DG C-Arm 1-60 Min-No Report Result Date: 10/29/2023 Fluoroscopy was utilized by the requesting physician.  No radiographic interpretation.   DG C-Arm 1-60 Min-No Report Result Date: 10/29/2023 Fluoroscopy was utilized by the requesting physician.  No radiographic interpretation.   DG ERCP Result Date: 10/29/2023 CLINICAL DATA:  [id number] Choledocholithiasis 09811 EXAM: ERCP COMPARISON:  CT AP, 10/20/2023. MRCP, 10/27/2023. US Abdomen, 10/19/2023. FLUOROSCOPY: Exposure Index (as provided by the fluoroscopic device): 58.89 mGy Kerma FINDINGS: Limited oblique planar images of the RIGHT upper quadrant obtained C-arm. Images demonstrating flexible endoscopy, biliary duct cannulation, retrograde cholangiogram and pancreatic stent placement.  No overt biliary ductal dilation or discrete biliary filling defect is demonstrated. IMPRESSION: Fluoroscopic imaging for ERCP and pancreatic stent placement. For complete description of intra procedural findings, please see performing service dictation. Electronically Signed   By: Roanna Banning M.D.   On: 10/29/2023 07:08   DG C-Arm 1-60 Min-No Report Result Date: 10/28/2023 Fluoroscopy was utilized by the requesting physician.  No radiographic interpretation.    Scheduled Meds:  amLODipine  10 mg Oral Daily   benazepril  40 mg Oral Daily   docusate sodium  100 mg Oral BID   hydrochlorothiazide  25 mg Oral Daily   insulin aspart  0-9 Units Subcutaneous Q4H   melatonin  3 mg Oral QHS   mupirocin ointment  1 Application Nasal BID   ondansetron (ZOFRAN) IV  4 mg Intravenous Once   pantoprazole  40 mg Oral BID AC   Continuous Infusions:     LOS: 3 days   Burnadette Pop, MD [facility] P3/21/2025, 11:28 AM

## 2023-10-30 NOTE — Progress Notes (Signed)
 [facility] Progress Note  1 Day Post-Op  Subjective: CC:  Pain controlled. Denies nausea/vomiting. +flatus. No BM since Sunday. Asking for something to help with sleep.   Objective: Vital signs in last 24 hours: Temp:  [97.7 F (36.5 C)-99.1 F (37.3 C)] 98.6 F (37 C) (03/21 0743) Pulse Rate:  [77-104] 78 (03/21 0743) Resp:  [15-18] 15 (03/21 0743) BP: (110-150)/(64-73) 123/64 (03/21 0743) SpO2:  [93 %-95 %] 94 % (03/21 0743) Weight:  [91.6 kg] 91.6 kg (03/20 1417)    Intake/Output from previous day: 03/20 0701 - 03/21 0700 In: 600 [I.V.:600] Out: 825 [Urine:500; Drains:315; Blood:10] Intake/Output this shift: No intake/output data recorded.  PE: Gen:  Alert, NAD, pleasant Card:  Regular rate and rhythm Pulm:  Normal effort ORA Abd: Soft, appropiately tender, no rebound tenderness or guarding, RUQ blake drain with bile-tinged sanguinous drainage (315 mL/24h) Skin: warm and dry, no rashes  Psych: A&Ox3   Lab Results:  Recent Labs    10/29/23 0437 10/30/23 0441  WBC 9.8 14.1*  HGB 13.2 12.2*  HCT 40.6 37.1*  PLT 318 299   BMET Recent Labs    10/29/23 0437 10/30/23 0441  NA 136 133*  K 3.8 4.3  CL 105 101  CO2 22 25  GLUCOSE 163* 226*  BUN 17 18  CREATININE 0.99 1.22  CALCIUM 9.2 8.9   PT/INR No results for input(s): "LABPROT", "INR" in the last 72 hours. CMP     Component Value Date/Time   NA 133 (L) 10/30/2023 0441   NA 134 10/16/2023 1404   K 4.3 10/30/2023 0441   CL 101 10/30/2023 0441   CO2 25 10/30/2023 0441   GLUCOSE 226 (H) 10/30/2023 0441   BUN 18 10/30/2023 0441   BUN 17 10/16/2023 1404   CREATININE 1.22 10/30/2023 0441   CREATININE 1.05 02/18/2013 0916   CALCIUM 8.9 10/30/2023 0441   PROT 6.6 10/30/2023 0441   PROT 7.0 10/19/2023 1102   ALBUMIN 3.1 (L) 10/30/2023 0441   ALBUMIN 4.2 10/19/2023 1102   AST 65 (H) 10/30/2023 0441   ALT 132 (H) 10/30/2023 0441   ALKPHOS 283 (H) 10/30/2023 0441   BILITOT 2.1 (H)  10/30/2023 0441   BILITOT 1.2 10/19/2023 1102   GFRNONAA >60 10/30/2023 0441   GFRNONAA 75 02/18/2013 0916   GFRAA 79 08/14/2020 1020   GFRAA 86 02/18/2013 0916   Lipase     Component Value Date/Time   LIPASE 104 (H) 10/27/2023 1125       Studies/Results: DG C-Arm 1-60 Min-No Report Result Date: 10/29/2023 Fluoroscopy was utilized by the requesting physician.  No radiographic interpretation.   DG C-Arm 1-60 Min-No Report Result Date: 10/29/2023 Fluoroscopy was utilized by the requesting physician.  No radiographic interpretation.   DG ERCP Result Date: 10/29/2023 CLINICAL DATA:  16109 Choledocholithiasis [address]454 EXAM: ERCP COMPARISON:  CT AP, 10/20/2023. MRCP, 10/27/2023. US Abdomen, 10/19/2023. FLUOROSCOPY: Exposure Index (as provided by the fluoroscopic device): 58.89 mGy Kerma FINDINGS: Limited oblique planar images of the RIGHT upper quadrant obtained C-arm. Images demonstrating flexible endoscopy, biliary duct cannulation, retrograde cholangiogram and pancreatic stent placement. No overt biliary ductal dilation or discrete biliary filling defect is demonstrated. IMPRESSION: Fluoroscopic imaging for ERCP and pancreatic stent placement. For complete description of intra procedural findings, please see performing service dictation. Electronically Signed   By: Roanna Banning M.D.   On: 10/29/2023 07:08   DG C-Arm 1-60 Min-No Report Result Date: 10/28/2023 Fluoroscopy was utilized by the requesting physician.  No  radiographic interpretation.    Anti-infectives: Anti-infectives (From admission, onward)    Start     Dose/Rate Route Frequency Ordered Stop   10/28/23 1345  ampicillin-sulbactam (UNASYN) 1.5 g in sodium chloride 0.9 % 100 mL IVPB        1.5 g 200 mL/hr over 30 Minutes Intravenous 60 min pre-op 10/28/23 1132 10/28/23 1532        Assessment/Plan  Choledocholithiasis - s/p unsuccessful ERCP 3/19  Chronic cholecystitis  POD#1 s/p  laparoscopic cholecystectomy and  attempted IOC (unsuccessful attempt), placement blake drain 3/20 Dr. Sheliah Hatch - AFVSS, WBC 13 - LFTs stable - monitor JP drain, certainly at risk for a post-operative bile leak - advance to [facility] Diet - added melatonin, PRN benadryl for sleep  - start BID stool softeners  - GI following for repeat ERCP, potentially Monday    LOS: 3 days   I reviewed nursing notes, Consultant GI notes, hospitalist notes, last 24 h vitals and pain scores, last 48 h intake and output, last 24 h labs and trends, and last 24 h imaging results.   Hosie Spangle, PA-C [facility] Please see Amion for pager number during day hours 7:00am-4:30pm

## 2023-10-30 NOTE — Progress Notes (Signed)
 Mobility Specialist Progress Note:   10/30/23 1052  Mobility  Activity Ambulated with assistance in hallway  Level of Assistance Contact guard assist, steadying assist  Assistive Device None  Distance Ambulated (ft) 300 ft  Activity Response Tolerated well  Mobility Referral Yes  Mobility visit 1 Mobility  Mobility Specialist Start Time (ACUTE ONLY) 1020  Mobility Specialist Stop Time (ACUTE ONLY) 1030  Mobility Specialist Time Calculation (min) (ACUTE ONLY) 10 min   Pt received in bed, agreeable to mobility. Pt displayed slight unsteadiness requiring CG during ambulation. Pt c/o slight leg weakness, otherwise asx throughout. VSS. Pt left in chair with call bell in reach and all needs met.   Leory Plowman  Mobility Specialist Please contact via [employer] office at [phone]

## 2023-10-30 NOTE — Progress Notes (Signed)
 Pt has been c/o increased abdominal pain.  Page Physician earlier in shift and pain regiment was revised.  Pain was resolved momentarily with pain meds.  Pt now c/o abd pain again, with "shaky feeling".  Vitals WNL.  JP drain not putting out "brown bile".  Paged Dr. Renford Dills to inform.  Physician stated that he will inform surgery team.  I informed the pt and sig. Other that Attending will notify surgery team.  Giving PO pain meds and administering Insulin per ISS.  Will continue to monitor pt.

## 2023-10-31 DIAGNOSIS — K222 Esophageal obstruction: Secondary | ICD-10-CM | POA: Diagnosis not present

## 2023-10-31 DIAGNOSIS — K2100 Gastro-esophageal reflux disease with esophagitis, without bleeding: Secondary | ICD-10-CM | POA: Diagnosis not present

## 2023-10-31 DIAGNOSIS — K8050 Calculus of bile duct without cholangitis or cholecystitis without obstruction: Secondary | ICD-10-CM | POA: Diagnosis not present

## 2023-10-31 LAB — CBC
HCT: 34.8 % — ABNORMAL LOW (ref 39.0–52.0)
Hemoglobin: 11.5 g/dL — ABNORMAL LOW (ref 13.0–17.0)
MCH: 27.3 pg (ref 26.0–34.0)
MCHC: 33 g/dL (ref 30.0–36.0)
MCV: 82.5 fL (ref 80.0–100.0)
Platelets: 259 10*3/uL (ref 150–400)
RBC: 4.22 MIL/uL (ref 4.22–5.81)
RDW: 14.4 % (ref 11.5–15.5)
WBC: 15.5 10*3/uL — ABNORMAL HIGH (ref 4.0–10.5)
nRBC: 0 % (ref 0.0–0.2)

## 2023-10-31 LAB — COMPREHENSIVE METABOLIC PANEL
ALT: 88 U/L — ABNORMAL HIGH (ref 0–44)
AST: 32 U/L (ref 15–41)
Albumin: 2.9 g/dL — ABNORMAL LOW (ref 3.5–5.0)
Alkaline Phosphatase: 229 U/L — ABNORMAL HIGH (ref 38–126)
Anion gap: 12 (ref 5–15)
BUN: 28 mg/dL — ABNORMAL HIGH (ref 8–23)
CO2: 24 mmol/L (ref 22–32)
Calcium: 9.1 mg/dL (ref 8.9–10.3)
Chloride: 98 mmol/L (ref 98–111)
Creatinine, Ser: 1.64 mg/dL — ABNORMAL HIGH (ref 0.61–1.24)
GFR, Estimated: 43 mL/min — ABNORMAL LOW (ref >60)
Glucose, Bld: 206 mg/dL — ABNORMAL HIGH (ref 70–99)
Potassium: 3.3 mmol/L — ABNORMAL LOW (ref 3.5–5.1)
Sodium: 134 mmol/L — ABNORMAL LOW (ref 135–145)
Total Bilirubin: 2.9 mg/dL — ABNORMAL HIGH (ref 0.0–1.2)
Total Protein: 6.6 g/dL (ref 6.5–8.1)

## 2023-10-31 LAB — GLUCOSE, CAPILLARY
Glucose-Capillary: 196 mg/dL — ABNORMAL HIGH (ref 70–99)
Glucose-Capillary: 198 mg/dL — ABNORMAL HIGH (ref 70–99)
Glucose-Capillary: 202 mg/dL — ABNORMAL HIGH (ref 70–99)
Glucose-Capillary: 218 mg/dL — ABNORMAL HIGH (ref 70–99)
Glucose-Capillary: 244 mg/dL — ABNORMAL HIGH (ref 70–99)
Glucose-Capillary: 265 mg/dL — ABNORMAL HIGH (ref 70–99)

## 2023-10-31 MED ORDER — SODIUM CHLORIDE 0.9 % IV SOLN: INTRAVENOUS | Status: DC

## 2023-10-31 MED ORDER — POTASSIUM CHLORIDE CRYS ER 20 MEQ PO TBCR
40.0000 meq | EXTENDED_RELEASE_TABLET | Freq: Once | ORAL | Status: AC
  Administered 2023-10-31: 40 meq via ORAL
  Filled 2023-10-31: qty 2

## 2023-10-31 MED ORDER — METRONIDAZOLE 500 MG/100ML IV SOLN
500.0000 mg | Freq: Two times a day (BID) | INTRAVENOUS | Status: DC
  Administered 2023-10-31 – 2023-11-03 (×7): 500 mg via INTRAVENOUS
  Filled 2023-10-31 (×7): qty 100

## 2023-10-31 NOTE — Progress Notes (Signed)
 PROGRESS NOTE  Jorge Garcia  ZOX:[id number] DOB: 09-19-1945 DOA: 10/27/2023 PCP: Bennie Pierini, FNP   Brief Narrative: Patient is a 78 year old male with history of diabetes type 2, hypertension, GERD, cholelithiasis who was admitted after outpatient workup showed elevated liver function test, findings of choledocholithiasis, intrahepatic biliary duct dilatation.  On presentation, patient is hemodynamically stable.  Lab work showed elevated liver enzymes, lipase of 104.  MRCP showed numerous gallstones contracted in the gallbladder, no gallbladder wall thickening, a stone in the CBD, dilation of common hepatic duct to 1 cm.  Gastroenterology attempted ERCP on 3/19, procedure was not successful.  General surgery did laparoscopic cholecystectomy on 3/20.  Plan for ERCP on Monday  Assessment & Plan:  Principal Problem:   Choledocholithiasis Active Problems:   Hypertension   Hyperlipidemia associated with type 2 diabetes mellitus (HCC)   Diabetes mellitus treated with oral medication (HCC)   Esophageal stricture   Duodenitis   Gastroesophageal reflux disease with esophagitis without hemorrhage   Choledocholithiasis: Presented with abnormal liver function test.  MRCP as above.  No abdominal pain, nausea or vomiting.  Admitted for ERCP.  GI following.   Gastroenterology attempted ERCP on 3/19, procedure was not successful.   Also found to have benign-appearing esophageal stenosis, esophagitis, duodenitis.  Started on PPI. Liver enzymes continue to improve. Status post laparoscopic cholecystectomy, placement of JP drain on 3/20.  Attempted IOC but was unsuccessful.  Plan for ERCP on Monday  Leukocytosis/AKI: Continue IV fluids.  Continue current antibiotics: Currently on ceftriaxone, Flagyl.  He was also febrile 1 time yesterday.  Follow-up blood cultures.  Feels much better today.  Afebrile.  Abdominal pain has improved.  Tube is draining bilious fluid  Diabetes type 2: Recent A1c of  7.8.  On metformin at home.  Currently on sliding scale  Hyperlipidemia: On Crestor.  Currently on hold due to elevated liver enzymes  Hypertension: On Norvasc, hydrochlorothiazide,benazipril at home.  Blood pressure stable.  Currently on Norvasc.  Hydrochlorothiazide, benazepril on hold due to AKI  Hypokalemia: Supplement with potassium  Leukocytosis: Most likely reactive.  Check CBC tomorrow        DVT prophylaxis:SCDs Start: 10/27/23 2130     Code Status: Full Code  Family Communication: Discussed with family member at bedside on 3/22  Patient status:Inpatient  Patient is from :Home  Anticipated discharge WJ:XBJY  Estimated DC date: After full workup,plan for ERCP on Monday   Consultants: GI, general surgery  Procedures: ERCP  Antimicrobials:  Anti-infectives (From admission, onward)    Start     Dose/Rate Route Frequency Ordered Stop   10/31/23 0800  metroNIDAZOLE (FLAGYL) IVPB 500 mg        500 mg 100 mL/hr over 60 Minutes Intravenous 2 times daily 10/31/23 0743     10/30/23 1730  cefTRIAXone (ROCEPHIN) 2 g in sodium chloride 0.9 % 100 mL IVPB       Note to Pharmacy: Pharmacy may adjust dosing strength / duration / interval for maximal efficacy   2 g 200 mL/hr over 30 Minutes Intravenous Every 24 hours 10/30/23 1644     10/28/23 1345  ampicillin-sulbactam (UNASYN) 1.5 g in sodium chloride 0.9 % 100 mL IVPB        1.5 g 200 mL/hr over 30 Minutes Intravenous 60 min pre-op 10/28/23 1132 10/28/23 1532       Subjective: Patient seen and examined at bedside today.  Comfortable this morning.  He had severe abdominal pain yesterday.  Feels much better today.  Passing gas.  No nausea or vomiting.  Afebrile this morning  Objective: Vitals:   10/30/23 2033 10/31/23 0054 10/31/23 0529 10/31/23 0748  BP: 126/69  123/62 117/68  Pulse: 97  94 95  Resp: 19  18 16   Temp: (!) 102.2 F (39 C) 99.2 F (37.3 C) 99.2 F (37.3 C) (!) 100.9 F (38.3 C)  TempSrc: Oral  Oral Oral Oral  SpO2: 92%  93% 93%  Weight:      Height:        Intake/Output Summary (Last 24 hours) at 10/31/2023 1140 Last data filed at 10/31/2023 0932 Gross per 24 hour  Intake 350 ml  Output 680 ml  Net -330 ml   Filed Weights   10/27/23 0959 10/29/23 1417  Weight: 91.6 kg 91.6 kg    Examination:   General exam: Overall comfortable, not in distress HEENT: PERRL Respiratory system:  no wheezes or crackles  Cardiovascular system: S1 & S2 heard, RRR.  Gastrointestinal system: Abdomen is mildly distended, mostly nontender,  surgical wounds, right upper quadrant drain Central nervous system: Alert and oriented Extremities: No edema, no clubbing ,no cyanosis Skin: No rashes, no ulcers,no icterus    Data Reviewed: I have personally reviewed following labs and imaging studies  CBC: Recent Labs  Lab 10/27/23 1125 10/28/23 0427 10/29/23 0437 10/30/23 0441 10/31/23 0405  WBC 8.0 8.4 9.8 14.1* 15.5*  NEUTROABS 5.2  --   --   --   --   HGB 12.6* 11.7* 13.2 12.2* 11.5*  HCT 38.5* 35.6* 40.6 37.1* 34.8*  MCV 84.1 82.4 83.4 82.6 82.5  PLT 281 269 318 299 259   Basic Metabolic Panel: Recent Labs  Lab 10/27/23 1125 10/28/23 0427 10/29/23 0437 10/30/23 0441 10/31/23 0405  NA 135 136 136 133* 134*  K 3.3* 3.5 3.8 4.3 3.3*  CL 101 103 105 101 98  CO2 21* 21* 22 25 24   GLUCOSE 156* 195* 163* 226* 206*  BUN 32* 23 17 18  28*  CREATININE 1.18 0.91 0.99 1.22 1.64*  CALCIUM 9.4 9.1 9.2 8.9 9.1     Recent Results (from the past 240 hours)  Surgical PCR screen     Status: None   Collection Time: 10/29/23  9:50 AM   Specimen: Nasal Mucosa; Nasal Swab  Result Value Ref Range Status   MRSA, PCR NEGATIVE NEGATIVE Final   Staphylococcus aureus NEGATIVE NEGATIVE Final    Comment: (NOTE) The Xpert SA Assay (FDA approved for NASAL specimens in patients 58 years of age and older), is one component of a comprehensive surveillance program. It is not intended to diagnose  infection nor to guide or monitor treatment. Performed at [facility], [address] [place], [place] [address]      Radiology Studies: DG Cholangiogram Operative Result Date: 10/31/2023 CLINICAL DATA:  Laparoscopic cholecystectomy with attempted intraoperative cholangiogram EXAM: INTRAOPERATIVE CHOLANGIOGRAM TECHNIQUE: Cholangiographic images from the C-arm fluoroscopic device were submitted for interpretation post-operatively. Please see the procedural report for the amount of contrast and the fluoroscopy time utilized. FLUOROSCOPY: Radiation Exposure Index (as provided by the fluoroscopic device): 2.46 mGy Kerma COMPARISON:  ERCP 10/28/2023; MRCP 10/27/2023 FINDINGS: Mom saved cine images are submitted for review. The images demonstrate a plastic stent in the pancreatic duct and laparoscopic ports in place. Contrast injection is performed through the cystic duct remnant, however injected contrast extravasates into the peritoneum. No visualization of the cystic or biliary tree. IMPRESSION: Unsuccessful attempted intraoperative cholangiogram. Injected contrast material extravasates into the  peritoneal space. No visualization of the cystic or common bile ducts. Plastic pancreatic duct stent in place. Electronically Signed   By: Malachy Moan M.D.   On: 10/31/2023 07:50   DG C-Arm 1-60 Min-No Report Result Date: 10/29/2023 Fluoroscopy was utilized by the requesting physician.  No radiographic interpretation.   DG C-Arm 1-60 Min-No Report Result Date: 10/29/2023 Fluoroscopy was utilized by the requesting physician.  No radiographic interpretation.    Scheduled Meds:  amLODipine  10 mg Oral Daily   docusate sodium  100 mg Oral BID   insulin aspart  0-9 Units Subcutaneous Q4H   melatonin  3 mg Oral QHS   mupirocin ointment  1 Application Nasal BID   ondansetron (ZOFRAN) IV  4 mg Intravenous Once   pantoprazole  40 mg Oral BID AC   Continuous Infusions:  sodium chloride 75 mL/hr  at 10/31/23 1038   cefTRIAXone (ROCEPHIN)  IV Stopped (10/30/23 1807)   metronidazole Stopped (10/31/23 1038)      LOS: 4 days   Burnadette Pop, MD [facility] P3/22/2025, 11:40 AM

## 2023-10-31 NOTE — H&P (View-Only) (Signed)
 [facility] ROUNDING NOTE   Subjective: Patient feeling well this morning.  Had a good night sleep.  He had some nausea and vomiting yesterday, but none overnight and this morning.  Tolerated regular breakfast.  Liver enzymes overall stable, T. bili up a little, alk phos/ALT improving.  White count 15, afebrile.   Objective: Vital signs in last 24 hours: Temp:  [98.4 F (36.9 C)-102.2 F (39 C)] 100.9 F (38.3 C) (03/22 0748) Pulse Rate:  [94-99] 95 (03/22 0748) Resp:  [16-19] 16 (03/22 0748) BP: (117-171)/(62-87) 117/68 (03/22 0748) SpO2:  [89 %-93 %] 93 % (03/22 0748) Last BM Date : 10/26/23 General: NAD, pleasant Caucasian male, wife at bedside Lungs:  CTA b/l, no w/r/r Heart:  RRR, no m/r/g Abdomen:  Soft, NT, ND, +BS Ext:  No c/c/e    Intake/Output from previous day: 03/21 0701 - 03/22 0700 In: 350 [P.O.:250; IV Piggyback:100] Out: 890 [Urine:400; Drains:490] Intake/Output this shift: Total I/O In: -  Out: 100 [Drains:100]   Lab Results: Recent Labs    10/29/23 0437 10/30/23 0441 10/31/23 0405  WBC 9.8 14.1* 15.5*  HGB 13.2 12.2* 11.5*  PLT 318 299 259  MCV 83.4 82.6 82.5   BMET Recent Labs    10/29/23 0437 10/30/23 0441 10/31/23 0405  NA 136 133* 134*  K 3.8 4.3 3.3*  CL 105 101 98  CO2 22 25 24   GLUCOSE 163* 226* 206*  BUN 17 18 28*  CREATININE 0.99 1.22 1.64*  CALCIUM 9.2 8.9 9.1   LFT Recent Labs    10/29/23 0437 10/30/23 0441 10/31/23 0405  PROT 6.9 6.6 6.6  ALBUMIN 3.2* 3.1* 2.9*  AST 59* 65* 32  ALT 170* 132* 88*  ALKPHOS 353* 283* 229*  BILITOT 2.1* 2.1* 2.9*   PT/INR No results for input(s): "INR" in the last 72 hours.    Imaging/Other results: DG Cholangiogram Operative Result Date: 10/31/2023 CLINICAL DATA:  Laparoscopic cholecystectomy with attempted intraoperative cholangiogram EXAM: INTRAOPERATIVE CHOLANGIOGRAM TECHNIQUE: Cholangiographic images from the C-arm fluoroscopic device were submitted for  interpretation post-operatively. Please see the procedural report for the amount of contrast and the fluoroscopy time utilized. FLUOROSCOPY: Radiation Exposure Index (as provided by the fluoroscopic device): 2.46 mGy Kerma COMPARISON:  ERCP 10/28/2023; MRCP 10/27/2023 FINDINGS: Mom saved cine images are submitted for review. The images demonstrate a plastic stent in the pancreatic duct and laparoscopic ports in place. Contrast injection is performed through the cystic duct remnant, however injected contrast extravasates into the peritoneum. No visualization of the cystic or biliary tree. IMPRESSION: Unsuccessful attempted intraoperative cholangiogram. Injected contrast material extravasates into the peritoneal space. No visualization of the cystic or common bile ducts. Plastic pancreatic duct stent in place. Electronically Signed   By: Malachy Moan M.D.   On: 10/31/2023 07:50   DG C-Arm 1-60 Min-No Report Result Date: 10/29/2023 Fluoroscopy was utilized by the requesting physician.  No radiographic interpretation.   DG C-Arm 1-60 Min-No Report Result Date: 10/29/2023 Fluoroscopy was utilized by the requesting physician.  No radiographic interpretation.      Assessment and Plan:  78 year old male with 1 month of nausea, found to have elevated liver enzymes, and imaging consistent with chronic cholecystitis and choledocholithiasis.  ERCP March 19 unsuccessful due to large periampullary diverticulum.  Status postcholecystectomy March 20, but intraoperative cholangiogram also unsuccessful.  He is scheduled for ERCP with Dr. Meridee Score on Monday  Choledocholithiasis - ERCP Monday with Dr. Meridee Score - Okay to continue regular diet today and tomorrow, n.p.o. after  midnight Sunday night - Continue antibiotics  GERD with peptic stricture - Continue Protonix twice daily for 4 weeks, then once daily  Jenel Lucks, MD  10/31/2023, 9:41 AM [facility]

## 2023-10-31 NOTE — Plan of Care (Signed)

## 2023-10-31 NOTE — Progress Notes (Signed)
 [facility] ROUNDING NOTE   Subjective: Patient feeling well this morning.  Had a good night sleep.  He had some nausea and vomiting yesterday, but none overnight and this morning.  Tolerated regular breakfast.  Liver enzymes overall stable, T. bili up a little, alk phos/ALT improving.  White count 15, afebrile.   Objective: Vital signs in last 24 hours: Temp:  [98.4 F (36.9 C)-102.2 F (39 C)] 100.9 F (38.3 C) (03/22 0748) Pulse Rate:  [94-99] 95 (03/22 0748) Resp:  [16-19] 16 (03/22 0748) BP: (117-171)/(62-87) 117/68 (03/22 0748) SpO2:  [89 %-93 %] 93 % (03/22 0748) Last BM Date : 10/26/23 General: NAD, pleasant Caucasian male, wife at bedside Lungs:  CTA b/l, no w/r/r Heart:  RRR, no m/r/g Abdomen:  Soft, NT, ND, +BS Ext:  No c/c/e    Intake/Output from previous day: 03/21 0701 - 03/22 0700 In: 350 [P.O.:250; IV Piggyback:100] Out: 890 [Urine:400; Drains:490] Intake/Output this shift: Total I/O In: -  Out: 100 [Drains:100]   Lab Results: Recent Labs    10/29/23 0437 10/30/23 0441 10/31/23 0405  WBC 9.8 14.1* 15.5*  HGB 13.2 12.2* 11.5*  PLT 318 299 259  MCV 83.4 82.6 82.5   BMET Recent Labs    10/29/23 0437 10/30/23 0441 10/31/23 0405  NA 136 133* 134*  K 3.8 4.3 3.3*  CL 105 101 98  CO2 22 25 24   GLUCOSE 163* 226* 206*  BUN 17 18 28*  CREATININE 0.99 1.22 1.64*  CALCIUM 9.2 8.9 9.1   LFT Recent Labs    10/29/23 0437 10/30/23 0441 10/31/23 0405  PROT 6.9 6.6 6.6  ALBUMIN 3.2* 3.1* 2.9*  AST 59* 65* 32  ALT 170* 132* 88*  ALKPHOS 353* 283* 229*  BILITOT 2.1* 2.1* 2.9*   PT/INR No results for input(s): "INR" in the last 72 hours.    Imaging/Other results: DG Cholangiogram Operative Result Date: 10/31/2023 CLINICAL DATA:  Laparoscopic cholecystectomy with attempted intraoperative cholangiogram EXAM: INTRAOPERATIVE CHOLANGIOGRAM TECHNIQUE: Cholangiographic images from the C-arm fluoroscopic device were submitted for  interpretation post-operatively. Please see the procedural report for the amount of contrast and the fluoroscopy time utilized. FLUOROSCOPY: Radiation Exposure Index (as provided by the fluoroscopic device): 2.46 mGy Kerma COMPARISON:  ERCP 10/28/2023; MRCP 10/27/2023 FINDINGS: Mom saved cine images are submitted for review. The images demonstrate a plastic stent in the pancreatic duct and laparoscopic ports in place. Contrast injection is performed through the cystic duct remnant, however injected contrast extravasates into the peritoneum. No visualization of the cystic or biliary tree. IMPRESSION: Unsuccessful attempted intraoperative cholangiogram. Injected contrast material extravasates into the peritoneal space. No visualization of the cystic or common bile ducts. Plastic pancreatic duct stent in place. Electronically Signed   By: Malachy Moan M.D.   On: 10/31/2023 07:50   DG C-Arm 1-60 Min-No Report Result Date: 10/29/2023 Fluoroscopy was utilized by the requesting physician.  No radiographic interpretation.   DG C-Arm 1-60 Min-No Report Result Date: 10/29/2023 Fluoroscopy was utilized by the requesting physician.  No radiographic interpretation.      Assessment and Plan:  78 year old male with 1 month of nausea, found to have elevated liver enzymes, and imaging consistent with chronic cholecystitis and choledocholithiasis.  ERCP March 19 unsuccessful due to large periampullary diverticulum.  Status postcholecystectomy March 20, but intraoperative cholangiogram also unsuccessful.  He is scheduled for ERCP with Dr. Meridee Score on Monday  Choledocholithiasis - ERCP Monday with Dr. Meridee Score - Okay to continue regular diet today and tomorrow, n.p.o. after  midnight Sunday night - Continue antibiotics  GERD with peptic stricture - Continue Protonix twice daily for 4 weeks, then once daily  Jenel Lucks, MD  10/31/2023, 9:41 AM [facility]

## 2023-10-31 NOTE — Progress Notes (Signed)
 [facility] Progress Note  2 Days Post-Op  Subjective: CC:  Pt having more pain yesterday but feels ok today.  Objective: Vital signs in last 24 hours: Temp:  [98.4 F (36.9 C)-102.2 F (39 C)] 100.9 F (38.3 C) (03/22 0748) Pulse Rate:  [94-99] 95 (03/22 0748) Resp:  [16-19] 16 (03/22 0748) BP: (117-171)/(62-87) 117/68 (03/22 0748) SpO2:  [89 %-93 %] 93 % (03/22 0748) Last BM Date : 10/26/23  Intake/Output from previous day: 03/21 0701 - 03/22 0700 In: 350 [P.O.:250; IV Piggyback:100] Out: 890 [Urine:400; Drains:490] Intake/Output this shift: Total I/O In: -  Out: 100 [Drains:100]  PE: Gen:  Alert, NAD, pleasant Pulm:  Normal effort ORA Abd: Soft, appropiately tender, no rebound tenderness or guarding, RUQ blake drain with bilious drainage (490 mL/24h) Skin: warm and dry, no rashes  Psych: A&Ox3   Lab Results:  Recent Labs    10/30/23 0441 10/31/23 0405  WBC 14.1* 15.5*  HGB 12.2* 11.5*  HCT 37.1* 34.8*  PLT 299 259   BMET Recent Labs    10/30/23 0441 10/31/23 0405  NA 133* 134*  K 4.3 3.3*  CL 101 98  CO2 25 24  GLUCOSE 226* 206*  BUN 18 28*  CREATININE 1.22 1.64*  CALCIUM 8.9 9.1   PT/INR No results for input(s): "LABPROT", "INR" in the last 72 hours. CMP     Component Value Date/Time   NA 134 (L) 10/31/2023 0405   NA 134 10/16/2023 1404   K 3.3 (L) 10/31/2023 0405   CL 98 10/31/2023 0405   CO2 24 10/31/2023 0405   GLUCOSE 206 (H) 10/31/2023 0405   BUN 28 (H) 10/31/2023 0405   BUN 17 10/16/2023 1404   CREATININE 1.64 (H) 10/31/2023 0405   CREATININE 1.05 02/18/2013 0916   CALCIUM 9.1 10/31/2023 0405   PROT 6.6 10/31/2023 0405   PROT 7.0 10/19/2023 1102   ALBUMIN 2.9 (L) 10/31/2023 0405   ALBUMIN 4.2 10/19/2023 1102   AST 32 10/31/2023 0405   ALT 88 (H) 10/31/2023 0405   ALKPHOS 229 (H) 10/31/2023 0405   BILITOT 2.9 (H) 10/31/2023 0405   BILITOT 1.2 10/19/2023 1102   GFRNONAA 43 (L) 10/31/2023 0405   GFRNONAA 75  02/18/2013 0916   GFRAA 79 08/14/2020 1020   GFRAA 86 02/18/2013 0916   Lipase     Component Value Date/Time   LIPASE 104 (H) 10/27/2023 1125       Studies/Results: DG Cholangiogram Operative Result Date: 10/31/2023 CLINICAL DATA:  Laparoscopic cholecystectomy with attempted intraoperative cholangiogram EXAM: INTRAOPERATIVE CHOLANGIOGRAM TECHNIQUE: Cholangiographic images from the C-arm fluoroscopic device were submitted for interpretation post-operatively. Please see the procedural report for the amount of contrast and the fluoroscopy time utilized. FLUOROSCOPY: Radiation Exposure Index (as provided by the fluoroscopic device): 2.46 mGy Kerma COMPARISON:  ERCP 10/28/2023; MRCP 10/27/2023 FINDINGS: Mom saved cine images are submitted for review. The images demonstrate a plastic stent in the pancreatic duct and laparoscopic ports in place. Contrast injection is performed through the cystic duct remnant, however injected contrast extravasates into the peritoneum. No visualization of the cystic or biliary tree. IMPRESSION: Unsuccessful attempted intraoperative cholangiogram. Injected contrast material extravasates into the peritoneal space. No visualization of the cystic or common bile ducts. Plastic pancreatic duct stent in place. Electronically Signed   By: Malachy Moan M.D.   On: 10/31/2023 07:50   DG C-Arm 1-60 Min-No Report Result Date: 10/29/2023 Fluoroscopy was utilized by the requesting physician.  No radiographic interpretation.   DG C-Arm  1-60 Min-No Report Result Date: 10/29/2023 Fluoroscopy was utilized by the requesting physician.  No radiographic interpretation.    Anti-infectives: Anti-infectives (From admission, onward)    Start     Dose/Rate Route Frequency Ordered Stop   10/31/23 0800  metroNIDAZOLE (FLAGYL) IVPB 500 mg        500 mg 100 mL/hr over 60 Minutes Intravenous 2 times daily 10/31/23 0743     10/30/23 1730  cefTRIAXone (ROCEPHIN) 2 g in sodium chloride 0.9  % 100 mL IVPB       Note to Pharmacy: Pharmacy may adjust dosing strength / duration / interval for maximal efficacy   2 g 200 mL/hr over 30 Minutes Intravenous Every 24 hours 10/30/23 1644     10/28/23 1345  ampicillin-sulbactam (UNASYN) 1.5 g in sodium chloride 0.9 % 100 mL IVPB        1.5 g 200 mL/hr over 30 Minutes Intravenous 60 min pre-op 10/28/23 1132 10/28/23 1532        Assessment/Plan  Choledocholithiasis - s/p unsuccessful ERCP 3/19  Chronic cholecystitis  POD#2 s/p  laparoscopic cholecystectomy and attempted IOC (unsuccessful attempt), placement JP drain 3/20 Dr. Sheliah Hatch - AFVSS, WBC 15 - T bili increasing - empty JP drain frequently, does appear to have a post-operative bile leak - if output and pain continue, may need to switch to clears  - melatonin, PRN benadryl for sleep  - BID stool softeners  - GI following for repeat ERCP, potentially Monday    LOS: 4 days   I reviewed nursing notes, Consultant GI notes, hospitalist notes, last 24 h vitals and pain scores, last 48 h intake and output, last 24 h labs and trends, and last 24 h imaging results.   Vanita Panda, MD  Colorectal and General Surgery [facility]

## 2023-10-31 NOTE — Progress Notes (Signed)
 Mobility Specialist: Progress Note   10/31/23 1601  Mobility  Activity Ambulated with assistance in hallway  Level of Assistance Contact guard assist, steadying assist  Assistive Device None  Distance Ambulated (ft) 400 ft  Activity Response Tolerated well  Mobility Referral Yes  Mobility visit 1 Mobility  Mobility Specialist Start Time (ACUTE ONLY) 1050  Mobility Specialist Stop Time (ACUTE ONLY) 1058  Mobility Specialist Time Calculation (min) (ACUTE ONLY) 8 min    Pt was agreeable ot mobility session - received in bed. CG throughout ambulation d/t unsteadiness. Returned to room without fault. Left in bed with all needs met, call bell in reach. RN and wife in room.   Maurene Capes Mobility Specialist Please contact via [employer] or Rehab office at [phone]

## 2023-11-01 DIAGNOSIS — K8050 Calculus of bile duct without cholangitis or cholecystitis without obstruction: Secondary | ICD-10-CM | POA: Diagnosis not present

## 2023-11-01 LAB — COMPREHENSIVE METABOLIC PANEL WITH GFR
ALT: 55 U/L — ABNORMAL HIGH (ref 0–44)
AST: 18 U/L (ref 15–41)
Albumin: 2.4 g/dL — ABNORMAL LOW (ref 3.5–5.0)
Alkaline Phosphatase: 174 U/L — ABNORMAL HIGH (ref 38–126)
Anion gap: 10 (ref 5–15)
BUN: 20 mg/dL (ref 8–23)
CO2: 25 mmol/L (ref 22–32)
Calcium: 8.3 mg/dL — ABNORMAL LOW (ref 8.9–10.3)
Chloride: 97 mmol/L — ABNORMAL LOW (ref 98–111)
Creatinine, Ser: 1.14 mg/dL (ref 0.61–1.24)
GFR, Estimated: >60 mL/min
Glucose, Bld: 161 mg/dL — ABNORMAL HIGH (ref 70–99)
Potassium: 3.2 mmol/L — ABNORMAL LOW (ref 3.5–5.1)
Sodium: 132 mmol/L — ABNORMAL LOW (ref 135–145)
Total Bilirubin: 1.6 mg/dL — ABNORMAL HIGH (ref 0.0–1.2)
Total Protein: 6 g/dL — ABNORMAL LOW (ref 6.5–8.1)

## 2023-11-01 LAB — GLUCOSE, CAPILLARY
Glucose-Capillary: 159 mg/dL — ABNORMAL HIGH (ref 70–99)
Glucose-Capillary: 191 mg/dL — ABNORMAL HIGH (ref 70–99)
Glucose-Capillary: 217 mg/dL — ABNORMAL HIGH (ref 70–99)
Glucose-Capillary: 228 mg/dL — ABNORMAL HIGH (ref 70–99)
Glucose-Capillary: 232 mg/dL — ABNORMAL HIGH (ref 70–99)
Glucose-Capillary: 242 mg/dL — ABNORMAL HIGH (ref 70–99)

## 2023-11-01 LAB — CBC
HCT: 31.1 % — ABNORMAL LOW (ref 39.0–52.0)
Hemoglobin: 10.3 g/dL — ABNORMAL LOW (ref 13.0–17.0)
MCH: 27.3 pg (ref 26.0–34.0)
MCHC: 33.1 g/dL (ref 30.0–36.0)
MCV: 82.5 fL (ref 80.0–100.0)
Platelets: 224 10*3/uL (ref 150–400)
RBC: 3.77 MIL/uL — ABNORMAL LOW (ref 4.22–5.81)
RDW: 14.2 % (ref 11.5–15.5)
WBC: 12.5 10*3/uL — ABNORMAL HIGH (ref 4.0–10.5)
nRBC: 0 % (ref 0.0–0.2)

## 2023-11-01 MED ORDER — POTASSIUM CHLORIDE CRYS ER 20 MEQ PO TBCR
40.0000 meq | EXTENDED_RELEASE_TABLET | Freq: Once | ORAL | Status: AC
Start: 2023-11-01 — End: 2023-11-01
  Administered 2023-11-01: 40 meq via ORAL
  Filled 2023-11-01: qty 2

## 2023-11-01 NOTE — Progress Notes (Signed)
 PROGRESS NOTE  Jorge Garcia  VHQ:[id number] DOB: 1946/03/05 DOA: 10/27/2023 PCP: Bennie Pierini, FNP   Brief Narrative: Patient is a 78 year old male with history of diabetes type 2, hypertension, GERD, cholelithiasis who was admitted after outpatient workup showed elevated liver function test, findings of choledocholithiasis, intrahepatic biliary duct dilatation.  On presentation, patient is hemodynamically stable.  Lab work showed elevated liver enzymes, lipase of 104.  MRCP showed numerous gallstones contracted in the gallbladder, no gallbladder wall thickening, a stone in the CBD, dilation of common hepatic duct to 1 cm.  Gastroenterology attempted ERCP on 3/19, procedure was not successful.  General surgery did laparoscopic cholecystectomy on 3/20.  Plan for ERCP on Monday  Assessment & Plan:  Principal Problem:   Choledocholithiasis Active Problems:   Hypertension   Hyperlipidemia associated with type 2 diabetes mellitus (HCC)   Diabetes mellitus treated with oral medication (HCC)   Esophageal stricture   Duodenitis   Gastroesophageal reflux disease with esophagitis without hemorrhage   Choledocholithiasis: Presented with abnormal liver function test.  MRCP as above.  No abdominal pain, nausea or vomiting.  Admitted for ERCP.  GI following.   Gastroenterology attempted ERCP on 3/19, procedure was not successful.   Also found to have benign-appearing esophageal stenosis, esophagitis, duodenitis.  Started on PPI. Liver enzymes continue to improve. Status post laparoscopic cholecystectomy, placement of JP drain on 3/20.  Attempted IOC but was unsuccessful.  Plan for ERCP on Monday  Leukocytosis/AKI: Both improved now.  Continue current antibiotics: Currently on ceftriaxone, Flagyl.  Cultures have been negative so far.   Afebrile.  Abdominal pain has improved.  Tube is draining bilious fluid  Diabetes type 2: Recent A1c of 7.8.  On metformin at home.  Currently on sliding  scale  Hyperlipidemia: On Crestor.  Currently on hold due to elevated liver enzymes  Hypertension: On Norvasc, hydrochlorothiazide,benazipril at home.  Blood pressure stable.  Currently on Norvasc.  Hydrochlorothiazide, benazepril on hold due to AKI  Hypokalemia: Supplemented with potassium         DVT prophylaxis:SCDs Start: 10/27/23 2130     Code Status: Full Code  Family Communication: Discussed with family member at bedside on 3/22  Patient status:Inpatient  Patient is from :Home  Anticipated discharge UX:LKGM  Estimated DC date: After full workup,plan for ERCP on Monday   Consultants: GI, general surgery  Procedures: ERCP  Antimicrobials:  Anti-infectives (From admission, onward)    Start     Dose/Rate Route Frequency Ordered Stop   10/31/23 0800  metroNIDAZOLE (FLAGYL) IVPB 500 mg        500 mg 100 mL/hr over 60 Minutes Intravenous 2 times daily 10/31/23 0743     10/30/23 1730  cefTRIAXone (ROCEPHIN) 2 g in sodium chloride 0.9 % 100 mL IVPB       Note to Pharmacy: Pharmacy may adjust dosing strength / duration / interval for maximal efficacy   2 g 200 mL/hr over 30 Minutes Intravenous Every 24 hours 10/30/23 1644     10/28/23 1345  ampicillin-sulbactam (UNASYN) 1.5 g in sodium chloride 0.9 % 100 mL IVPB        1.5 g 200 mL/hr over 30 Minutes Intravenous 60 min pre-op 10/28/23 1132 10/28/23 1532       Subjective: Patient seen and examined at bedside today.  Hemodynamically stable.  Overall comfortable.  Lying in bed.  No nausea, vomiting.  No severe abdominal pain.  Had a small bowel movement  Objective: Vitals:   10/31/23 1552 10/31/23 2037  11/01/23 0449 11/01/23 0738  BP: 123/64 130/68 126/62 135/66  Pulse: 85 91 67 77  Resp: 16 19 18 16   Temp: 98.5 F (36.9 C) 99.4 F (37.4 C) 98.6 F (37 C) 98.6 F (37 C)  TempSrc: Oral Oral Oral Oral  SpO2: 93% 93% 93% 95%  Weight:      Height:        Intake/Output Summary (Last 24 hours) at 11/01/2023  1144 Last data filed at 11/01/2023 0800 Gross per 24 hour  Intake 336 ml  Output 440 ml  Net -104 ml   Filed Weights   10/27/23 0959 10/29/23 1417  Weight: 91.6 kg 91.6 kg    Examination:  General exam: Overall comfortable, not in distress HEENT: PERRL Respiratory system:  no wheezes or crackles  Cardiovascular system: S1 & S2 heard, RRR.  Gastrointestinal system: Abdomen is mildly distended, soft and nontender.  Bowel sound present.  Right upper quadrant drain Central nervous system: Alert and oriented Extremities: No edema, no clubbing ,no cyanosis Skin: No rashes, no ulcers,no icterus    Data Reviewed: I have personally reviewed following labs and imaging studies  CBC: Recent Labs  Lab 10/27/23 1125 10/28/23 0427 10/29/23 0437 10/30/23 0441 10/31/23 0405 11/01/23 0419  WBC 8.0 8.4 9.8 14.1* 15.5* 12.5*  NEUTROABS 5.2  --   --   --   --   --   HGB 12.6* 11.7* 13.2 12.2* 11.5* 10.3*  HCT 38.5* 35.6* 40.6 37.1* 34.8* 31.1*  MCV 84.1 82.4 83.4 82.6 82.5 82.5  PLT 281 269 318 299 259 224   Basic Metabolic Panel: Recent Labs  Lab 10/28/23 0427 10/29/23 0437 10/30/23 0441 10/31/23 0405 11/01/23 0419  NA 136 136 133* 134* 132*  K 3.5 3.8 4.3 3.3* 3.2*  CL 103 105 101 98 97*  CO2 21* 22 25 24 25   GLUCOSE 195* 163* 226* 206* 161*  BUN 23 17 18  28* 20  CREATININE 0.91 0.99 1.22 1.64* 1.14  CALCIUM 9.1 9.2 8.9 9.1 8.3*     Recent Results (from the past 240 hours)  Surgical PCR screen     Status: None   Collection Time: 10/29/23  9:50 AM   Specimen: Nasal Mucosa; Nasal Swab  Result Value Ref Range Status   MRSA, PCR NEGATIVE NEGATIVE Final   Staphylococcus aureus NEGATIVE NEGATIVE Final    Comment: (NOTE) The Xpert SA Assay (FDA approved for NASAL specimens in patients 27 years of age and older), is one component of a comprehensive surveillance program. It is not intended to diagnose infection nor to guide or monitor treatment. Performed at [facility], [address], [place], [place] [address]   Culture, blood (Routine X 2) w Reflex to ID Panel     Status: None (Preliminary result)   Collection Time: 10/31/23  8:41 AM   Specimen: BLOOD LEFT ARM  Result Value Ref Range Status   Specimen Description BLOOD LEFT ARM  Final   Special Requests   Final    BOTTLES DRAWN AEROBIC AND ANAEROBIC Blood Culture results may not be optimal due to an inadequate volume of blood received in culture bottles   Culture   Final    NO GROWTH < 24 HOURS Performed at [facility], [address], [place], [place] [address]    Report Status PENDING  Incomplete  Culture, blood (Routine X 2) w Reflex to ID Panel     Status: None (Preliminary result)   Collection Time: 10/31/23  8:45 AM   Specimen: BLOOD RIGHT HAND  Result Value Ref Range Status   Specimen Description BLOOD RIGHT HAND  Final   Special Requests   Final    BOTTLES DRAWN AEROBIC AND ANAEROBIC Blood Culture results may not be optimal due to an inadequate volume of blood received in culture bottles   Culture   Final    NO GROWTH < 24 HOURS Performed at [facility], [address], [place], [place] [address]    Report Status PENDING  Incomplete     Radiology Studies: No results found.   Scheduled Meds:  amLODipine  10 mg Oral Daily   docusate sodium  100 mg Oral BID   insulin aspart  0-9 Units Subcutaneous Q4H   melatonin  3 mg Oral QHS   mupirocin ointment  1 Application Nasal BID   ondansetron (ZOFRAN) IV  4 mg Intravenous Once   pantoprazole  40 mg Oral BID AC   Continuous Infusions:  cefTRIAXone (ROCEPHIN)  IV 2 g (10/31/23 1637)   metronidazole 500 mg (11/01/23 0839)      LOS: 5 days   Burnadette Pop, MD [facility] P3/23/2025, 11:44 AM

## 2023-11-01 NOTE — Plan of Care (Signed)

## 2023-11-01 NOTE — Progress Notes (Signed)
 [facility] Progress Note  3 Days Post-Op  Subjective: CC:  Pt feels ok today.  Mainly having pain from drain site  Objective: Vital signs in last 24 hours: Temp:  [98.5 F (36.9 C)-99.6 F (37.6 C)] 98.6 F (37 C) (03/23 0738) Pulse Rate:  [67-93] 77 (03/23 0738) Resp:  [16-19] 16 (03/23 0738) BP: (122-135)/(60-68) 135/66 (03/23 0738) SpO2:  [93 %-95 %] 95 % (03/23 0738) Last BM Date : 11/01/23  Intake/Output from previous day: 03/22 0701 - 03/23 0700 In: 199.4 [IV Piggyback:199.4] Out: 540 [Urine:200; Drains:340] Intake/Output this shift: Total I/O In: 236 [P.O.:236] Out: -   PE: Gen:  Alert, NAD, pleasant Pulm:  Normal effort ORA Abd: Soft, appropiately tender, no rebound tenderness or guarding, RUQ blake drain with bilious drainage (340 mL/24h) Skin: warm and dry, no rashes  Psych: A&Ox3   Lab Results:  Recent Labs    10/31/23 0405 11/01/23 0419  WBC 15.5* 12.5*  HGB 11.5* 10.3*  HCT 34.8* 31.1*  PLT 259 224   BMET Recent Labs    10/31/23 0405 11/01/23 0419  NA 134* 132*  K 3.3* 3.2*  CL 98 97*  CO2 24 25  GLUCOSE 206* 161*  BUN 28* 20  CREATININE 1.64* 1.14  CALCIUM 9.1 8.3*   PT/INR No results for input(s): "LABPROT", "INR" in the last 72 hours. CMP     Component Value Date/Time   NA 132 (L) 11/01/2023 0419   NA 134 10/16/2023 1404   K 3.2 (L) 11/01/2023 0419   CL 97 (L) 11/01/2023 0419   CO2 25 11/01/2023 0419   GLUCOSE 161 (H) 11/01/2023 0419   BUN 20 11/01/2023 0419   BUN 17 10/16/2023 1404   CREATININE 1.14 11/01/2023 0419   CREATININE 1.05 02/18/2013 0916   CALCIUM 8.3 (L) 11/01/2023 0419   PROT 6.0 (L) 11/01/2023 0419   PROT 7.0 10/19/2023 1102   ALBUMIN 2.4 (L) 11/01/2023 0419   ALBUMIN 4.2 10/19/2023 1102   AST 18 11/01/2023 0419   ALT 55 (H) 11/01/2023 0419   ALKPHOS 174 (H) 11/01/2023 0419   BILITOT 1.6 (H) 11/01/2023 0419   BILITOT 1.2 10/19/2023 1102   GFRNONAA >60 11/01/2023 0419   GFRNONAA 75  02/18/2013 0916   GFRAA 79 08/14/2020 1020   GFRAA 86 02/18/2013 0916   Lipase     Component Value Date/Time   LIPASE 104 (H) 10/27/2023 1125       Studies/Results: No results found.   Anti-infectives: Anti-infectives (From admission, onward)    Start     Dose/Rate Route Frequency Ordered Stop   10/31/23 0800  metroNIDAZOLE (FLAGYL) IVPB 500 mg        500 mg 100 mL/hr over 60 Minutes Intravenous 2 times daily 10/31/23 0743     10/30/23 1730  cefTRIAXone (ROCEPHIN) 2 g in sodium chloride 0.9 % 100 mL IVPB       Note to Pharmacy: Pharmacy may adjust dosing strength / duration / interval for maximal efficacy   2 g 200 mL/hr over 30 Minutes Intravenous Every 24 hours 10/30/23 1644     10/28/23 1345  ampicillin-sulbactam (UNASYN) 1.5 g in sodium chloride 0.9 % 100 mL IVPB        1.5 g 200 mL/hr over 30 Minutes Intravenous 60 min pre-op 10/28/23 1132 10/28/23 1532        Assessment/Plan  Choledocholithiasis - s/p unsuccessful ERCP 3/19  Chronic cholecystitis  POD#3 s/p  laparoscopic cholecystectomy and attempted IOC (unsuccessful attempt), placement  JP drain 3/20 Dr. Sheliah Hatch - AFVSS, WBC decreasing - T bili decreasing - empty JP drain frequently, does appear to have a post-operative bile leak - melatonin, PRN benadryl for sleep  - BID stool softeners  - GI following for repeat ERCP Monday with Mansouraty     LOS: 5 days   I reviewed nursing notes, Consultant GI notes, hospitalist notes, last 24 h vitals and pain scores, last 48 h intake and output, last 24 h labs and trends, and last 24 h imaging results.   Vanita Panda, MD  Colorectal and General Surgery [facility]

## 2023-11-02 ENCOUNTER — Encounter (HOSPITAL_COMMUNITY)
Admission: EM | Disposition: A | Discharge status: Home / Self Care | Payer: Self-pay | Source: Ambulatory Visit | Attending: Internal Medicine

## 2023-11-02 ENCOUNTER — Encounter (HOSPITAL_COMMUNITY): Payer: Self-pay | Admitting: Internal Medicine

## 2023-11-02 ENCOUNTER — Inpatient Hospital Stay (HOSPITAL_COMMUNITY)

## 2023-11-02 DIAGNOSIS — I10 Essential (primary) hypertension: Secondary | ICD-10-CM | POA: Diagnosis not present

## 2023-11-02 DIAGNOSIS — R7989 Other specified abnormal findings of blood chemistry: Secondary | ICD-10-CM

## 2023-11-02 DIAGNOSIS — Z9889 Other specified postprocedural states: Secondary | ICD-10-CM | POA: Diagnosis not present

## 2023-11-02 DIAGNOSIS — K8050 Calculus of bile duct without cholangitis or cholecystitis without obstruction: Secondary | ICD-10-CM

## 2023-11-02 DIAGNOSIS — E119 Type 2 diabetes mellitus without complications: Secondary | ICD-10-CM | POA: Diagnosis not present

## 2023-11-02 DIAGNOSIS — K838 Other specified diseases of biliary tract: Secondary | ICD-10-CM

## 2023-11-02 DIAGNOSIS — K839 Disease of biliary tract, unspecified: Secondary | ICD-10-CM

## 2023-11-02 DIAGNOSIS — K5710 Diverticulosis of small intestine without perforation or abscess without bleeding: Secondary | ICD-10-CM | POA: Diagnosis not present

## 2023-11-02 HISTORY — PX: STONE EXTRACTION WITH BASKET: SHX5318

## 2023-11-02 HISTORY — PX: BILIARY STENT PLACEMENT: SHX5538

## 2023-11-02 HISTORY — PX: ERCP: SHX5425

## 2023-11-02 LAB — GLUCOSE, CAPILLARY
Glucose-Capillary: 105 mg/dL — ABNORMAL HIGH (ref 70–99)
Glucose-Capillary: 172 mg/dL — ABNORMAL HIGH (ref 70–99)
Glucose-Capillary: 156 mg/dL — ABNORMAL HIGH (ref 70–99)
Glucose-Capillary: 130 mg/dL — ABNORMAL HIGH (ref 70–99)
Glucose-Capillary: 187 mg/dL — ABNORMAL HIGH (ref 70–99)
Glucose-Capillary: 179 mg/dL — ABNORMAL HIGH (ref 70–99)
Glucose-Capillary: 297 mg/dL — ABNORMAL HIGH (ref 70–99)

## 2023-11-02 LAB — COMPREHENSIVE METABOLIC PANEL
ALT: 46 U/L — ABNORMAL HIGH (ref 0–44)
AST: 24 U/L (ref 15–41)
Albumin: 2.6 g/dL — ABNORMAL LOW (ref 3.5–5.0)
Alkaline Phosphatase: 166 U/L — ABNORMAL HIGH (ref 38–126)
Anion gap: 8 (ref 5–15)
BUN: 16 mg/dL (ref 8–23)
CO2: 22 mmol/L (ref 22–32)
Calcium: 8.9 mg/dL (ref 8.9–10.3)
Chloride: 104 mmol/L (ref 98–111)
Creatinine, Ser: 1 mg/dL (ref 0.61–1.24)
GFR, Estimated: >60 mL/min (ref >60)
Glucose, Bld: 147 mg/dL — ABNORMAL HIGH (ref 70–99)
Potassium: 3.6 mmol/L (ref 3.5–5.1)
Sodium: 134 mmol/L — ABNORMAL LOW (ref 135–145)
Total Bilirubin: 1.3 mg/dL — ABNORMAL HIGH (ref 0.0–1.2)
Total Protein: 6.1 g/dL — ABNORMAL LOW (ref 6.5–8.1)

## 2023-11-02 LAB — CBC
HCT: 32.1 % — ABNORMAL LOW (ref 39.0–52.0)
Hemoglobin: 10.6 g/dL — ABNORMAL LOW (ref 13.0–17.0)
MCH: 27.6 pg (ref 26.0–34.0)
MCHC: 33 g/dL (ref 30.0–36.0)
MCV: 83.6 fL (ref 80.0–100.0)
Platelets: 290 10*3/uL (ref 150–400)
RBC: 3.84 MIL/uL — ABNORMAL LOW (ref 4.22–5.81)
RDW: 14.1 % (ref 11.5–15.5)
WBC: 9.6 10*3/uL (ref 4.0–10.5)
nRBC: 0 % (ref 0.0–0.2)

## 2023-11-02 LAB — SURGICAL PATHOLOGY

## 2023-11-02 SURGERY — ERCP, WITH INTERVENTION IF INDICATED
Anesthesia: General

## 2023-11-02 MED ORDER — LIDOCAINE 2% (20 MG/ML) 5 ML SYRINGE
INTRAMUSCULAR | Status: DC | PRN
  Administered 2023-11-02: 60 mg via INTRAVENOUS

## 2023-11-02 MED ORDER — DICLOFENAC SUPPOSITORY 100 MG
RECTAL | Status: AC
Start: 2023-11-02 — End: ?
  Filled 2023-11-02: qty 1

## 2023-11-02 MED ORDER — SODIUM CHLORIDE 0.9 % IV SOLN
INTRAVENOUS | Status: DC | PRN
  Administered 2023-11-02: 30 mL

## 2023-11-02 MED ORDER — LACTATED RINGERS IV SOLN: INTRAVENOUS | Status: DC | PRN

## 2023-11-02 MED ORDER — PHENYLEPHRINE 80 MCG/ML (10ML) SYRINGE FOR IV PUSH (FOR BLOOD PRESSURE SUPPORT)
PREFILLED_SYRINGE | INTRAVENOUS | Status: DC | PRN
  Administered 2023-11-02 (×2): 160 ug via INTRAVENOUS

## 2023-11-02 MED ORDER — GLUCAGON HCL RDNA (DIAGNOSTIC) 1 MG IJ SOLR
INTRAMUSCULAR | Status: AC
  Filled 2023-11-02: qty 1

## 2023-11-02 MED ORDER — ROCURONIUM BROMIDE 10 MG/ML (PF) SYRINGE
PREFILLED_SYRINGE | INTRAVENOUS | Status: DC | PRN
  Administered 2023-11-02: 50 mg via INTRAVENOUS

## 2023-11-02 MED ORDER — INDOMETHACIN 50 MG RE SUPP
100.0000 mg | Freq: Once | RECTAL | Status: AC
  Administered 2023-11-03: 100 mg via RECTAL
  Filled 2023-11-02: qty 2

## 2023-11-02 MED ORDER — SUGAMMADEX SODIUM 200 MG/2ML IV SOLN
INTRAVENOUS | Status: DC | PRN
  Administered 2023-11-02: 200 mg via INTRAVENOUS

## 2023-11-02 MED ORDER — PROPOFOL 10 MG/ML IV BOLUS
INTRAVENOUS | Status: DC | PRN
  Administered 2023-11-02: 150 mg via INTRAVENOUS

## 2023-11-02 MED ORDER — DICLOFENAC SUPPOSITORY 100 MG
RECTAL | Status: DC | PRN
  Administered 2023-11-02: 100 mg via RECTAL

## 2023-11-02 MED ORDER — GLUCAGON HCL RDNA (DIAGNOSTIC) 1 MG IJ SOLR
INTRAMUSCULAR | Status: DC | PRN
Start: 2023-11-02 — End: 2023-11-02
  Administered 2023-11-02: .25 mg via INTRAVENOUS

## 2023-11-02 MED ORDER — DEXAMETHASONE SODIUM PHOSPHATE 10 MG/ML IJ SOLN
INTRAMUSCULAR | Status: DC | PRN
  Administered 2023-11-02: 10 mg via INTRAVENOUS

## 2023-11-02 MED ORDER — ONDANSETRON HCL 4 MG/2ML IJ SOLN
INTRAMUSCULAR | Status: DC | PRN
  Administered 2023-11-02: 4 mg via INTRAVENOUS

## 2023-11-02 NOTE — Progress Notes (Signed)
 Mobility Specialist Progress Note:   11/02/23 1048  Mobility  Activity Ambulated with assistance in hallway  Level of Assistance Contact guard assist, steadying assist  Assistive Device  (IV Pole)  Distance Ambulated (ft) 400 ft  Activity Response Tolerated well  Mobility Referral Yes  Mobility visit 1 Mobility  Mobility Specialist Start Time (ACUTE ONLY) 1000  Mobility Specialist Stop Time (ACUTE ONLY) 1012  Mobility Specialist Time Calculation (min) (ACUTE ONLY) 12 min   Pt received in bed, agreeable to mobility. CG during ambulation d/t slight unsteadiness, otherwise asx throughout. Pt returned to bed with call bell in reach and all needs met.   Leory Plowman  Mobility Specialist Please contact via [employer] office at [phone]

## 2023-11-02 NOTE — Progress Notes (Signed)
 [facility] Progress Note  4 Days Post-Op  Subjective: CC:  Having some RUQ discomfort around drain, improved with oxy. Reports a small, tan BM this AM. Denies nausea or vomiting.  Objective: Vital signs in last 24 hours: Temp:  [98.3 F (36.8 C)-99.8 F (37.7 C)] 98.3 F (36.8 C) (03/24 0724) Pulse Rate:  [66-87] 80 (03/24 0724) Resp:  [16-19] 16 (03/24 0724) BP: (131-152)/(62-71) 139/68 (03/24 0724) SpO2:  [93 %-95 %] 93 % (03/24 0724) Last BM Date : 11/02/23  Intake/Output from previous day: 03/23 0701 - 03/24 0700 In: 335.6 [P.O.:236; IV Piggyback:99.6] Out: 265 [Drains:265] Intake/Output this shift: No intake/output data recorded.  PE: Gen:  Alert, NAD, pleasant Pulm:  Normal effort ORA Abd: Soft, appropiately tender, no rebound tenderness or guarding, RUQ blake drain with bilious drainage (265-300 mL/24h) Skin: warm and dry, no rashes  Psych: A&Ox3   Lab Results:  Recent Labs    10/31/23 0405 11/01/23 0419  WBC 15.5* 12.5*  HGB 11.5* 10.3*  HCT 34.8* 31.1*  PLT 259 224   BMET Recent Labs    10/31/23 0405 11/01/23 0419  NA 134* 132*  K 3.3* 3.2*  CL 98 97*  CO2 24 25  GLUCOSE 206* 161*  BUN 28* 20  CREATININE 1.64* 1.14  CALCIUM 9.1 8.3*   PT/INR No results for input(s): "LABPROT", "INR" in the last 72 hours. CMP     Component Value Date/Time   NA 132 (L) 11/01/2023 0419   NA 134 10/16/2023 1404   K 3.2 (L) 11/01/2023 0419   CL 97 (L) 11/01/2023 0419   CO2 25 11/01/2023 0419   GLUCOSE 161 (H) 11/01/2023 0419   BUN 20 11/01/2023 0419   BUN 17 10/16/2023 1404   CREATININE 1.14 11/01/2023 0419   CREATININE 1.05 02/18/2013 0916   CALCIUM 8.3 (L) 11/01/2023 0419   PROT 6.0 (L) 11/01/2023 0419   PROT 7.0 10/19/2023 1102   ALBUMIN 2.4 (L) 11/01/2023 0419   ALBUMIN 4.2 10/19/2023 1102   AST 18 11/01/2023 0419   ALT 55 (H) 11/01/2023 0419   ALKPHOS 174 (H) 11/01/2023 0419   BILITOT 1.6 (H) 11/01/2023 0419   BILITOT 1.2  10/19/2023 1102   GFRNONAA >60 11/01/2023 0419   GFRNONAA 75 02/18/2013 0916   GFRAA 79 08/14/2020 1020   GFRAA 86 02/18/2013 0916   Lipase     Component Value Date/Time   LIPASE 104 (H) 10/27/2023 1125       Studies/Results: No results found.   Anti-infectives: Anti-infectives (From admission, onward)    Start     Dose/Rate Route Frequency Ordered Stop   10/31/23 0800  metroNIDAZOLE (FLAGYL) IVPB 500 mg        500 mg 100 mL/hr over 60 Minutes Intravenous 2 times daily 10/31/23 0743     10/30/23 1730  cefTRIAXone (ROCEPHIN) 2 g in sodium chloride 0.9 % 100 mL IVPB       Note to Pharmacy: Pharmacy may adjust dosing strength / duration / interval for maximal efficacy   2 g 200 mL/hr over 30 Minutes Intravenous Every 24 hours 10/30/23 1644     10/28/23 1345  ampicillin-sulbactam (UNASYN) 1.5 g in sodium chloride 0.9 % 100 mL IVPB        1.5 g 200 mL/hr over 30 Minutes Intravenous 60 min pre-op 10/28/23 1132 10/28/23 1532        Assessment/Plan Choledocholithiasis - s/p unsuccessful ERCP 3/19  Chronic cholecystitis  POD#4 s/p  laparoscopic cholecystectomy and attempted  IOC (unsuccessful attempt), placement JP drain 3/20 Dr. Sheliah Hatch - AFVSS, AM labs are pending - empty JP drain frequently, does appear to have a post-operative bile leak - melatonin, PRN benadryl for sleep  - BID stool softeners  - GI following for repeat ERCP today with Mansouraty - pt not currently on endoscopy schedule so hopefully he Is still scheduled for today.    LOS: 6 days   I reviewed nursing notes, Consultant GI notes, hospitalist notes, last 24 h vitals and pain scores, last 48 h intake and output, last 24 h labs and trends, and last 24 h imaging results.   Jorge Spangle, PA-C [facility] Please see Amion for pager number during day hours 7:00am-4:30pm

## 2023-11-02 NOTE — Anesthesia Preprocedure Evaluation (Addendum)
 Anesthesia Evaluation  Patient identified by MRN, date of birth, ID band Patient awake    Reviewed: Allergy & Precautions, NPO status , Patient's Chart, lab work & pertinent test results  History of Anesthesia Complications Negative for: history of anesthetic complications  Airway Mallampati: III  TM Distance: >3 FB Neck ROM: Full    Dental  (+) Dental Advisory Given, Teeth Intact   Pulmonary former smoker   Pulmonary exam normal        Cardiovascular hypertension, Pt. on medications Normal cardiovascular exam     Neuro/Psych negative neurological ROS  negative psych ROS   GI/Hepatic Neg liver ROS,GERD  Medicated and Controlled,,  Endo/Other  diabetes, Type 2, Oral Hypoglycemic Agents   Na 132 K 3.2 Ca 8.3   Renal/GU negative Renal ROS     Musculoskeletal  (+) Arthritis ,    Abdominal   Peds  Hematology  (+) Blood dyscrasia, anemia   Anesthesia Other Findings   Reproductive/Obstetrics                             Anesthesia Physical Anesthesia Plan  ASA: 2  Anesthesia Plan: General   Post-op Pain Management: Minimal or no pain anticipated   Induction: Intravenous  PONV Risk Score and Plan: 2 and Treatment may vary due to age or medical condition, Ondansetron and Dexamethasone  Airway Management Planned: Oral ETT  Additional Equipment: None  Intra-op Plan:   Post-operative Plan: Extubation in OR  Informed Consent: I have reviewed the patients History and Physical, chart, labs and discussed the procedure including the risks, benefits and alternatives for the proposed anesthesia with the patient or authorized representative who has indicated his/her understanding and acceptance.     Dental advisory given  Plan Discussed with: CRNA and Anesthesiologist  Anesthesia Plan Comments:        Anesthesia Quick Evaluation

## 2023-11-02 NOTE — Plan of Care (Signed)

## 2023-11-02 NOTE — Progress Notes (Signed)
 PROGRESS NOTE  Jorge Garcia  ZOX:[id number] DOB: 06-01-1946 DOA: 10/27/2023 PCP: Bennie Pierini, FNP   Brief Narrative: Patient is a 78 year old male with history of diabetes type 2, hypertension, GERD, cholelithiasis who was admitted after outpatient workup showed elevated liver function test, findings of choledocholithiasis, intrahepatic biliary duct dilatation.  On presentation, patient is hemodynamically stable.  Lab work showed elevated liver enzymes, lipase of 104.  MRCP showed numerous gallstones contracted in the gallbladder, no gallbladder wall thickening, a stone in the CBD, dilation of common hepatic duct to 1 cm.  Gastroenterology attempted ERCP on 3/19, procedure was not successful.  General surgery did laparoscopic cholecystectomy on 3/20.  Plan for ERCP today  Assessment & Plan:  Principal Problem:   Choledocholithiasis Active Problems:   Hypertension   Hyperlipidemia associated with type 2 diabetes mellitus (HCC)   Diabetes mellitus treated with oral medication (HCC)   Esophageal stricture   Duodenitis   Gastroesophageal reflux disease with esophagitis without hemorrhage   Choledocholithiasis: Presented with abnormal liver function test.  MRCP as above.  No abdominal pain, nausea or vomiting.  Admitted for ERCP.  GI following.   Gastroenterology attempted ERCP on 3/19, procedure was not successful.   Also found to have benign-appearing esophageal stenosis, esophagitis, duodenitis.  Started on PPI. Liver enzymes continue to improve. Status post laparoscopic cholecystectomy, placement of JP drain on 3/20.  Attempted IOC but was unsuccessful.  Plan for ERCP today.  Denies any abdomen pain, nausea or vomiting today.  Had a small bowel movement this morning  Leukocytosis/AKI: Both improving.  Continue current antibiotics: Currently on ceftriaxone, Flagyl.  Cultures have been negative so far.   Afebrile.  Abdominal pain has improved.  Labs from today pending.  Diabetes  type 2: Recent A1c of 7.8.  On metformin at home.  Currently on sliding scale  Hyperlipidemia: On Crestor.  Currently on hold due to elevated liver enzymes  Hypertension: On Norvasc, hydrochlorothiazide,benazipril at home.  Blood pressure stable.  Currently on Norvasc.  Hydrochlorothiazide, benazepril on hold due to AKI  Hypokalemia: Supplemented with potassium         DVT prophylaxis:SCDs Start: 10/27/23 2130     Code Status: Full Code  Family Communication: Discussed with wife at bedside on 3/24  Patient status:Inpatient  Patient is from :Home  Anticipated discharge WJ:XBJY  Estimated DC date: likely tomorrow   Consultants: GI, general surgery  Procedures: ERCP, cholecystectomy  Antimicrobials:  Anti-infectives (From admission, onward)    Start     Dose/Rate Route Frequency Ordered Stop   10/31/23 0800  metroNIDAZOLE (FLAGYL) IVPB 500 mg        500 mg 100 mL/hr over 60 Minutes Intravenous 2 times daily 10/31/23 0743     10/30/23 1730  cefTRIAXone (ROCEPHIN) 2 g in sodium chloride 0.9 % 100 mL IVPB       Note to Pharmacy: Pharmacy may adjust dosing strength / duration / interval for maximal efficacy   2 g 200 mL/hr over 30 Minutes Intravenous Every 24 hours 10/30/23 1644     10/28/23 1345  ampicillin-sulbactam (UNASYN) 1.5 g in sodium chloride 0.9 % 100 mL IVPB        1.5 g 200 mL/hr over 30 Minutes Intravenous 60 min pre-op 10/28/23 1132 10/28/23 1532       Subjective: Patient seen and examined the bedside today.  Lying comfortably in bed.  Overall comfortable.  No abdomen pain, nausea or vomiting.  Had a small bowel movement today  Objective: Vitals:  11/01/23 1554 11/01/23 2049 11/02/23 0544 11/02/23 0724  BP: 131/69 138/62 (!) 152/71 139/68  Pulse: 87 73 66 80  Resp: 16 18 19 16   Temp: 99.8 F (37.7 C) 99.2 F (37.3 C) 98.7 F (37.1 C) 98.3 F (36.8 C)  TempSrc: Oral Oral Oral Oral  SpO2: 95% 93% 94% 93%  Weight:      Height:         Intake/Output Summary (Last 24 hours) at 11/02/2023 1103 Last data filed at 11/02/2023 0630 Gross per 24 hour  Intake --  Output 165 ml  Net -165 ml   Filed Weights   10/27/23 0959 10/29/23 1417  Weight: 91.6 kg 91.6 kg    Examination:  General exam: Overall comfortable, not in distress HEENT: PERRL Respiratory system:  no wheezes or crackles  Cardiovascular system: S1 & S2 heard, RRR.  Gastrointestinal system: Abdomen is mildly distended, soft and nontender.  Right upper quadrant drain Central nervous system: Alert and oriented Extremities: No edema, no clubbing ,no cyanosis Skin: No rashes, no ulcers,no icterus    Data Reviewed: I have personally reviewed following labs and imaging studies  CBC: Recent Labs  Lab 10/27/23 1125 10/28/23 0427 10/29/23 0437 10/30/23 0441 10/31/23 0405 11/01/23 0419  WBC 8.0 8.4 9.8 14.1* 15.5* 12.5*  NEUTROABS 5.2  --   --   --   --   --   HGB 12.6* 11.7* 13.2 12.2* 11.5* 10.3*  HCT 38.5* 35.6* 40.6 37.1* 34.8* 31.1*  MCV 84.1 82.4 83.4 82.6 82.5 82.5  PLT 281 269 318 299 259 224   Basic Metabolic Panel: Recent Labs  Lab 10/28/23 0427 10/29/23 0437 10/30/23 0441 10/31/23 0405 11/01/23 0419  NA 136 136 133* 134* 132*  K 3.5 3.8 4.3 3.3* 3.2*  CL 103 105 101 98 97*  CO2 21* 22 25 24 25   GLUCOSE 195* 163* 226* 206* 161*  BUN 23 17 18  28* 20  CREATININE 0.91 0.99 1.22 1.64* 1.14  CALCIUM 9.1 9.2 8.9 9.1 8.3*     Recent Results (from the past 240 hours)  Surgical PCR screen     Status: None   Collection Time: 10/29/23  9:50 AM   Specimen: Nasal Mucosa; Nasal Swab  Result Value Ref Range Status   MRSA, PCR NEGATIVE NEGATIVE Final   Staphylococcus aureus NEGATIVE NEGATIVE Final    Comment: (NOTE) The Xpert SA Assay (FDA approved for NASAL specimens in patients 31 years of age and older), is one component of a comprehensive surveillance program. It is not intended to diagnose infection nor to guide or monitor  treatment. Performed at [facility], [address], [place], [place] [address]   Culture, blood (Routine X 2) w Reflex to ID Panel     Status: None (Preliminary result)   Collection Time: 10/31/23  8:41 AM   Specimen: BLOOD LEFT ARM  Result Value Ref Range Status   Specimen Description BLOOD LEFT ARM  Final   Special Requests   Final    BOTTLES DRAWN AEROBIC AND ANAEROBIC Blood Culture results may not be optimal due to an inadequate volume of blood received in culture bottles   Culture   Final    NO GROWTH 2 DAYS Performed at [facility], [address] [place], [place] [address]    Report Status PENDING  Incomplete  Culture, blood (Routine X 2) w Reflex to ID Panel     Status: None (Preliminary result)   Collection Time: 10/31/23  8:45 AM   Specimen: BLOOD RIGHT HAND  Result Value Ref Range Status   Specimen Description BLOOD RIGHT HAND  Final   Special Requests   Final    BOTTLES DRAWN AEROBIC AND ANAEROBIC Blood Culture results may not be optimal due to an inadequate volume of blood received in culture bottles   Culture   Final    NO GROWTH 2 DAYS Performed at [facility], [address] [place], [place] [address]    Report Status PENDING  Incomplete     Radiology Studies: No results found.   Scheduled Meds:  amLODipine  10 mg Oral Daily   docusate sodium  100 mg Oral BID   insulin aspart  0-9 Units Subcutaneous Q4H   melatonin  3 mg Oral QHS   mupirocin ointment  1 Application Nasal BID   ondansetron (ZOFRAN) IV  4 mg Intravenous Once   pantoprazole  40 mg Oral BID AC   Continuous Infusions:  cefTRIAXone (ROCEPHIN)  IV 2 g (11/01/23 1637)   metronidazole 500 mg (11/02/23 0835)      LOS: 6 days   Burnadette Pop, MD [facility] P3/24/2025, 11:03 AM

## 2023-11-02 NOTE — TOC Progression Note (Signed)
 Transition of Care [facility]) - Progression Note    Patient Details  Name: Jorge Garcia MRN: [id number] Date of Birth: 1946-05-12  Transition of Care [facility] CM/SW Contact  Harriet Masson, RN Phone Number: 11/02/2023, 1:41 PM  Clinical Narrative:    ERCP scheduled for today.      Barriers to Discharge: Continued Medical Work up  Expected Discharge Plan and Services In-house Referral: Clinical Social Work Discharge Planning Services: CM Consult   Living arrangements for the past 2 months: Single Family Home                                       Social Determinants of Health (SDOH) Interventions SDOH Screenings   Food Insecurity: No Food Insecurity (10/28/2023)  Housing: Unknown (10/28/2023)  Transportation Needs: No Transportation Needs (10/28/2023)  Utilities: Not At Risk (10/28/2023)  Alcohol Screen: Low Risk  (09/07/2023)  Depression (PHQ2-9): Low Risk  (10/06/2023)  Financial Resource Strain: Low Risk  (09/07/2023)  Physical Activity: Sufficiently Active (09/07/2023)  Social Connections: Moderately Integrated (10/28/2023)  Stress: No Stress Concern Present (09/07/2023)  Tobacco Use: Medium Risk (10/29/2023)  Health Literacy: Adequate Health Literacy (09/07/2023)    Readmission Risk Interventions     No data to display

## 2023-11-02 NOTE — Plan of Care (Signed)
 Problem: Education: Goal: Knowledge of General Education information will improve Description: Including pain rating scale, medication(s)/side effects and non-pharmacologic comfort measures 11/02/2023 1944 by Heron Nay, RN Outcome: Progressing 11/02/2023 0611 by Heron Nay, RN Outcome: Progressing   Problem: Health Behavior/Discharge Planning: Goal: Ability to manage health-related needs will improve 11/02/2023 1944 by Heron Nay, RN Outcome: Progressing 11/02/2023 0611 by Heron Nay, RN Outcome: Progressing   Problem: Clinical Measurements: Goal: Ability to maintain clinical measurements within normal limits will improve 11/02/2023 1944 by Heron Nay, RN Outcome: Progressing 11/02/2023 0611 by Heron Nay, RN Outcome: Progressing Goal: Will remain free from infection 11/02/2023 1944 by Heron Nay, RN Outcome: Progressing 11/02/2023 0611 by Heron Nay, RN Outcome: Progressing Goal: Diagnostic test results will improve 11/02/2023 1944 by Heron Nay, RN Outcome: Progressing 11/02/2023 0611 by Heron Nay, RN Outcome: Progressing Goal: Respiratory complications will improve 11/02/2023 1944 by Heron Nay, RN Outcome: Progressing 11/02/2023 0611 by Heron Nay, RN Outcome: Progressing Goal: Cardiovascular complication will be avoided 11/02/2023 1944 by Heron Nay, RN Outcome: Progressing 11/02/2023 0611 by Heron Nay, RN Outcome: Progressing   Problem: Activity: Goal: Risk for activity intolerance will decrease 11/02/2023 1944 by Heron Nay, RN Outcome: Progressing 11/02/2023 0611 by Heron Nay, RN Outcome: Progressing   Problem: Nutrition: Goal: Adequate nutrition will be maintained 11/02/2023 1944 by Heron Nay, RN Outcome: Progressing 11/02/2023 0611 by Heron Nay, RN Outcome: Progressing   Problem: Coping: Goal: Level of anxiety will decrease 11/02/2023 1944 by Heron Nay, RN Outcome: Progressing 11/02/2023 0611 by Heron Nay,  RN Outcome: Progressing   Problem: Elimination: Goal: Will not experience complications related to bowel motility 11/02/2023 1944 by Heron Nay, RN Outcome: Progressing 11/02/2023 0611 by Heron Nay, RN Outcome: Progressing Goal: Will not experience complications related to urinary retention 11/02/2023 1944 by Heron Nay, RN Outcome: Progressing 11/02/2023 0611 by Heron Nay, RN Outcome: Progressing   Problem: Pain Managment: Goal: General experience of comfort will improve and/or be controlled 11/02/2023 1944 by Heron Nay, RN Outcome: Progressing 11/02/2023 0611 by Heron Nay, RN Outcome: Progressing   Problem: Safety: Goal: Ability to remain free from injury will improve 11/02/2023 1944 by Heron Nay, RN Outcome: Progressing 11/02/2023 0611 by Heron Nay, RN Outcome: Progressing   Problem: Skin Integrity: Goal: Risk for impaired skin integrity will decrease 11/02/2023 1944 by Heron Nay, RN Outcome: Progressing 11/02/2023 0611 by Heron Nay, RN Outcome: Progressing   Problem: Education: Goal: Ability to describe self-care measures that may prevent or decrease complications (Diabetes Survival Skills Education) will improve 11/02/2023 1944 by Heron Nay, RN Outcome: Progressing 11/02/2023 0611 by Heron Nay, RN Outcome: Progressing Goal: Individualized Educational Video(s) 11/02/2023 1944 by Heron Nay, RN Outcome: Progressing 11/02/2023 0611 by Heron Nay, RN Outcome: Progressing   Problem: Coping: Goal: Ability to adjust to condition or change in health will improve 11/02/2023 1944 by Heron Nay, RN Outcome: Progressing 11/02/2023 0611 by Heron Nay, RN Outcome: Progressing   Problem: Fluid Volume: Goal: Ability to maintain a balanced intake and output will improve 11/02/2023 1944 by Heron Nay, RN Outcome: Progressing 11/02/2023 0611 by Heron Nay, RN Outcome: Progressing   Problem: Health Behavior/Discharge Planning: Goal: Ability  to identify and utilize available resources and services will improve 11/02/2023 1944 by Heron Nay, RN Outcome: Progressing 11/02/2023 0611 by Heron Nay, RN Outcome: Progressing Goal: Ability to manage health-related needs will improve 11/02/2023 1944 by Heron Nay, RN Outcome: Progressing 11/02/2023 0611 by Heron Nay, RN Outcome: Progressing   Problem: Metabolic: Goal: Ability to maintain appropriate glucose  levels will improve 11/02/2023 1944 by Heron Nay, RN Outcome: Progressing 11/02/2023 0611 by Heron Nay, RN Outcome: Progressing   Problem: Nutritional: Goal: Maintenance of adequate nutrition will improve 11/02/2023 1944 by Heron Nay, RN Outcome: Progressing 11/02/2023 0611 by Heron Nay, RN Outcome: Progressing Goal: Progress toward achieving an optimal weight will improve 11/02/2023 1944 by Heron Nay, RN Outcome: Progressing 11/02/2023 0611 by Heron Nay, RN Outcome: Progressing   Problem: Skin Integrity: Goal: Risk for impaired skin integrity will decrease 11/02/2023 1944 by Heron Nay, RN Outcome: Progressing 11/02/2023 0611 by Heron Nay, RN Outcome: Progressing   Problem: Tissue Perfusion: Goal: Adequacy of tissue perfusion will improve 11/02/2023 1944 by Heron Nay, RN Outcome: Progressing 11/02/2023 0611 by Heron Nay, RN Outcome: Progressing

## 2023-11-02 NOTE — Interval H&P Note (Signed)
 History and Physical Interval Note:  11/02/2023 2:36 PM  Jorge Garcia  has presented today for surgery, with the diagnosis of choldocholithiasis.  The various methods of treatment have been discussed with the patient and family. After consideration of risks, benefits and other options for treatment, the patient has consented to  Procedure(s): ERCP, WITH INTERVENTION IF INDICATED (N/A) as a surgical intervention.  The patient's history has been reviewed, patient examined, no change in status, stable for surgery.  I have reviewed the patient's chart and labs.  Questions were answered to the patient's satisfaction.    The risks of an ERCP were discussed at length, including but not limited to the risk of perforation, bleeding, abdominal pain, post-ERCP pancreatitis (while usually mild can be severe and even life threatening).    [employer]

## 2023-11-02 NOTE — Anesthesia Procedure Notes (Signed)
 Procedure Name: Intubation Date/Time: 11/02/2023 2:46 PM  Performed by: Georgianne Fick D, CRNAPre-anesthesia Checklist: Patient identified, Emergency Drugs available, Suction available and Patient being monitored Patient Re-evaluated:Patient Re-evaluated prior to induction Oxygen Delivery Method: Circle System Utilized Preoxygenation: Pre-oxygenation with 100% oxygen Induction Type: IV induction Ventilation: Mask ventilation without difficulty Laryngoscope Size: Mac and 4 Grade View: Grade III Tube type: Oral Tube size: 7.5 mm Number of attempts: 1 Airway Equipment and Method: Stylet and Oral airway Placement Confirmation: ETT inserted through vocal cords under direct vision, positive ETCO2 and breath sounds checked- equal and bilateral Secured at: 22 cm Tube secured with: Tape Dental Injury: Teeth and Oropharynx as per pre-operative assessment

## 2023-11-02 NOTE — Anesthesia Postprocedure Evaluation (Signed)
 Anesthesia Post Note  Patient: Jorge Garcia  Procedure(s) Performed: ERCP, WITH INTERVENTION IF INDICATED INSERTION, STENT, BILE DUCT ERCP, WITH LITHROTRIPSY OR REMOVAL OF COMMON BILE DUCT CALCULUS     Patient location during evaluation: PACU Anesthesia Type: General Level of consciousness: awake and alert Pain management: pain level controlled Vital Signs Assessment: post-procedure vital signs reviewed and stable Respiratory status: spontaneous breathing, nonlabored ventilation and respiratory function stable Cardiovascular status: stable and blood pressure returned to baseline Anesthetic complications: no  No notable events documented.  Last Vitals:  Vitals:   11/02/23 1610 11/02/23 1620  BP: (!) 134/59 (!) 139/56  Pulse: 74 71  Resp: (!) 23 20  Temp:    SpO2: 95% 96%    Last Pain:  Vitals:   11/02/23 1620  TempSrc:   PainSc: 0-No pain                 Beryle Lathe

## 2023-11-02 NOTE — Op Note (Signed)
 [facility] Patient Name: Jorge Garcia Procedure Date : 11/02/2023 MRN: [id number] Attending MD: Corliss Parish , MD, [id number] Date of Birth: 1946/08/07 CSN: [id number] Age: 78 Admit Type: Inpatient Procedure:                ERCP Indications:              Common bile duct stone(s), Bile leak, Prior failed                            Endoscopic Retrograde Cholangiopancreatography Providers:                Corliss Parish, MD, Margaree Mackintosh, RN,                            Jacquelyn "Jaci" Clelia Croft, RN, Rozetta Nunnery,                            Technician Referring MD:              Medicines:                General Anesthesia, Antibiotics as per schedule,                            Diclofenac 100 mg rectal, Glucagon 0.5 mg IV Complications:            No immediate complications. Estimated Blood Loss:     Estimated blood loss was minimal. Procedure:                Pre-Anesthesia Assessment:                           - Prior to the procedure, a History and Physical                            was performed, and patient medications and                            allergies were reviewed. The patient's tolerance of                            previous anesthesia was also reviewed. The risks                            and benefits of the procedure and the sedation                            options and risks were discussed with the patient.                            All questions were answered, and informed consent                            was obtained. Prior Anticoagulants: The patient has  taken no anticoagulant or antiplatelet agents. ASA                            Grade Assessment: III - A patient with severe                            systemic disease. After reviewing the risks and                            benefits, the patient was deemed in satisfactory                            condition to undergo the procedure.                            After obtaining informed consent, the scope was                            passed under direct vision. Throughout the                            procedure, the patient's blood pressure, pulse, and                            oxygen saturations were monitored continuously. The                            [employer] single use                            duodenoscope was introduced through the mouth, and                            used to inject contrast into and used to inject                            contrast into the bile duct. The ERCP was                            accomplished without difficulty. The patient                            tolerated the procedure. Scope In: Scope Out: Findings:      A scout film of the abdomen was obtained. One percutaneous drain ending       in the Right upper quadrant was seen.      The esophagus was successfully intubated under direct vision without       detailed examination of the pharynx, larynx, and associated structures,       and upper GI tract. The major papilla was located partially within a       diverticulum. The major papilla was congested.      A short 0.025 inch Revolution Al Pimple was passed into the biliary tree       after going into long-position to try  and get a more enfos view. The       Revolution Jagtome sphincterotome was passed over the guidewire and the       bile duct was then deeply cannulated. Contrast was injected. I       personally interpreted the bile duct images. Ductal flow of contrast was       adequate. Image quality was adequate. Contrast extended to the hepatic       ducts. Opacification of the entire biliary tree except for the       gallbladder was successful. The middle third of the main bile duct       contained filling defects thought to be a stone and sludge. Slight       extravasation of contrast originating from the apparent cystic duct was       observed. A 7 mm biliary  sphincterotomy was made with a monofilament       Jagtome sphincterotome using ERBE electrocautery. There was no       post-sphincterotomy bleeding. To discover objects, the biliary tree was       swept with a retrieval balloon. Sludge was swept from the duct. A few       stones were removed. No stones remained. An occlusion cholangiogram was       performed that showed no further significant biliary pathology with only       minimal extravasation from the cystic duct. One 10 Fr by 7 cm plastic       biliary stent with a single external flap and a single internal flap was       placed into the common bile duct. The stent was in good position.      A formal pancreatogram was not performed.      The duodenoscope was withdrawn from the patient. Impression:               - The major papilla was located partially within a                            diverticulum. The major papilla appeared congested.                           - A filling defect consistent with a stone and                            sludge was seen on the cholangiogram.                           - Choledocholithiasis was found. Complete removal                            was accomplished by biliary sphincterotomy and                            balloon sweep.                           - A bile leak was found from cystic duct.                           - One plastic biliary stent  was placed into the                            common bile duct. Recommendation:           - The patient will be observed post-procedure,                            until all discharge criteria are met.                           - Return patient to hospital ward for ongoing care.                           - Observe patient's clinical course.                           - Repeat ERCP in 2-3 months to remove stent.                           - The findings and recommendations were discussed                            with the patient.                            - The findings and recommendations were discussed                            with the referring physician. Procedure Code(s):        --- Professional ---                           [phone], Endoscopic retrograde                            cholangiopancreatography (ERCP); with placement of                            endoscopic stent into biliary or pancreatic duct,                            including pre- and post-dilation and guide wire                            passage, when performed, including sphincterotomy,                            when performed, each stent                           [id number], Endoscopic retrograde                            cholangiopancreatography (ERCP); with removal of  calculi/debris from biliary/pancreatic duct(s)                           [phone], Endoscopic catheterization of the biliary                            ductal system, radiological supervision and                            interpretation Diagnosis Code(s):        --- Professional ---                           K83.8, Other specified diseases of biliary tract                           K83.9, Disease of biliary tract, unspecified                           K80.50, Calculus of bile duct without cholangitis                            or cholecystitis without obstruction                           Z98.890, Other specified postprocedural states                           R93.2, Abnormal findings on diagnostic imaging of                            liver and biliary tract CPT copyright 2022 [employer]. All rights reserved. The codes documented in this report are preliminary and upon coder review may  be revised to meet current compliance requirements. Corliss Parish, MD 11/02/2023 3:53:52 PM Number of Addenda: 0

## 2023-11-02 NOTE — Care Management Important Message (Signed)
 Important Message  Patient Details  Name: Jorge Garcia MRN: [id number] Date of Birth: [birth year]   Important Message Given:  Yes - Medicare IM     Dorena Bodo 11/02/2023, 2:29 PM

## 2023-11-02 NOTE — Plan of Care (Signed)

## 2023-11-02 NOTE — Transfer of Care (Signed)
 Immediate Anesthesia Transfer of Care Note  Patient: Jorge Garcia  Procedure(s) Performed: ERCP, WITH INTERVENTION IF INDICATED INSERTION, STENT, BILE DUCT ERCP, WITH LITHROTRIPSY OR REMOVAL OF COMMON BILE DUCT CALCULUS  Patient Location: Endoscopy Unit  Anesthesia Type:General  Level of Consciousness: awake, alert , and oriented  Airway & Oxygen Therapy: Patient Spontanous Breathing  Post-op Assessment: Report given to RN and Post -op Vital signs reviewed and stable  Post vital signs: Reviewed and stable  Last Vitals:  Vitals Value Taken Time  BP    Temp    Pulse    Resp    SpO2      Last Pain:  Vitals:   11/02/23 1346  TempSrc: Temporal  PainSc: 0-No pain         Complications: No notable events documented.

## 2023-11-03 ENCOUNTER — Other Ambulatory Visit (HOSPITAL_COMMUNITY): Payer: Self-pay

## 2023-11-03 ENCOUNTER — Telehealth: Payer: Self-pay

## 2023-11-03 ENCOUNTER — Encounter (HOSPITAL_COMMUNITY): Payer: Self-pay | Admitting: Gastroenterology

## 2023-11-03 DIAGNOSIS — K8050 Calculus of bile duct without cholangitis or cholecystitis without obstruction: Secondary | ICD-10-CM | POA: Diagnosis not present

## 2023-11-03 LAB — COMPREHENSIVE METABOLIC PANEL
ALT: 41 U/L (ref 0–44)
AST: 19 U/L (ref 15–41)
Albumin: 2.4 g/dL — ABNORMAL LOW (ref 3.5–5.0)
Alkaline Phosphatase: 157 U/L — ABNORMAL HIGH (ref 38–126)
Anion gap: 11 (ref 5–15)
BUN: 21 mg/dL (ref 8–23)
CO2: 21 mmol/L — ABNORMAL LOW (ref 22–32)
Calcium: 8.9 mg/dL (ref 8.9–10.3)
Chloride: 103 mmol/L (ref 98–111)
Creatinine, Ser: 0.89 mg/dL (ref 0.61–1.24)
GFR, Estimated: >60 mL/min (ref >60)
Glucose, Bld: 219 mg/dL — ABNORMAL HIGH (ref 70–99)
Potassium: 4.8 mmol/L (ref 3.5–5.1)
Sodium: 135 mmol/L (ref 135–145)
Total Bilirubin: 0.8 mg/dL (ref 0.0–1.2)
Total Protein: 5.8 g/dL — ABNORMAL LOW (ref 6.5–8.1)

## 2023-11-03 LAB — GLUCOSE, CAPILLARY
Glucose-Capillary: 203 mg/dL — ABNORMAL HIGH (ref 70–99)
Glucose-Capillary: 225 mg/dL — ABNORMAL HIGH (ref 70–99)
Glucose-Capillary: 306 mg/dL — ABNORMAL HIGH (ref 70–99)
Glucose-Capillary: 326 mg/dL — ABNORMAL HIGH (ref 70–99)

## 2023-11-03 LAB — CBC
HCT: 31.2 % — ABNORMAL LOW (ref 39.0–52.0)
Hemoglobin: 10.3 g/dL — ABNORMAL LOW (ref 13.0–17.0)
MCH: 27.1 pg (ref 26.0–34.0)
MCHC: 33 g/dL (ref 30.0–36.0)
MCV: 82.1 fL (ref 80.0–100.0)
Platelets: 310 10*3/uL (ref 150–400)
RBC: 3.8 MIL/uL — ABNORMAL LOW (ref 4.22–5.81)
RDW: 14 % (ref 11.5–15.5)
WBC: 10.9 10*3/uL — ABNORMAL HIGH (ref 4.0–10.5)
nRBC: 0 % (ref 0.0–0.2)

## 2023-11-03 MED ORDER — AMOXICILLIN-POT CLAVULANATE 875-125 MG PO TABS
1.0000 | ORAL_TABLET | Freq: Two times a day (BID) | ORAL | Status: DC
  Administered 2023-11-03: 1 via ORAL
  Filled 2023-11-03: qty 1

## 2023-11-03 MED ORDER — POLYETHYLENE GLYCOL 3350 17 G PO PACK: 17.0000 g | PACK | Freq: Every day | ORAL | Status: DC | PRN

## 2023-11-03 MED ORDER — ACETAMINOPHEN 325 MG PO TABS: 650.0000 mg | ORAL_TABLET | Freq: Four times a day (QID) | ORAL | Status: DC | PRN

## 2023-11-03 MED ORDER — OXYCODONE HCL 5 MG PO TABS
5.0000 mg | ORAL_TABLET | Freq: Four times a day (QID) | ORAL | 0 refills | Status: AC | PRN
Start: 2023-11-03 — End: 2023-11-06
  Filled 2023-11-03: qty 12, 3d supply, fill #0

## 2023-11-03 MED ORDER — AMOXICILLIN-POT CLAVULANATE 875-125 MG PO TABS
1.0000 | ORAL_TABLET | Freq: Two times a day (BID) | ORAL | 0 refills | Status: AC
  Filled 2023-11-03: qty 6, 3d supply, fill #0

## 2023-11-03 MED ORDER — PANTOPRAZOLE SODIUM 40 MG PO TBEC
40.0000 mg | DELAYED_RELEASE_TABLET | Freq: Two times a day (BID) | ORAL | 0 refills | Status: DC
  Filled 2023-11-03: qty 60, 30d supply, fill #0

## 2023-11-03 MED ORDER — DOCUSATE SODIUM 100 MG PO CAPS: 100.0000 mg | ORAL_CAPSULE | Freq: Two times a day (BID) | ORAL | Status: DC | PRN

## 2023-11-03 NOTE — Discharge Instructions (Signed)

## 2023-11-03 NOTE — Telephone Encounter (Signed)
-----   Message from [facility] sent at 11/02/2023  3:52 PM EDT ----- Regarding: ERCP Followup This patient needs an ERCP recall/followup in 2-3 months with me. I can take over his care from Dr. Russella Dar moving forward. Thanks. GM

## 2023-11-03 NOTE — Discharge Summary (Signed)
 Physician Discharge Summary  Jorge Garcia ZOX:[id number] DOB: July 31, 1946 DOA: 10/27/2023  PCP: Bennie Pierini, FNP  Admit date: 10/27/2023 Discharge date: 11/03/2023  Admitted From: Home Disposition:  Home  Discharge Condition:Stable CODE STATUS:FULL Diet recommendation: Heart Healthy   Brief/Interim Summary: Patient is a 78 year old male with history of diabetes type 2, hypertension, GERD, cholelithiasis who was admitted after outpatient workup showed elevated liver function test, findings of choledocholithiasis, intrahepatic biliary duct dilatation.  On presentation, patient is hemodynamically stable.  Lab work showed elevated liver enzymes, lipase of 104.  MRCP showed numerous gallstones contracted in the gallbladder, no gallbladder wall thickening, a stone in the CBD, dilation of common hepatic duct to 1 cm.  Gastroenterology attempted ERCP on 3/19, procedure was not successful.  General surgery did laparoscopic cholecystectomy on 3/20.   ERCP repeated with biliary sphincterotomy, balloon sweep placement of stent 3/24.  GI, general surgery cleared for discharge.  Medically stable for discharge today  Following problems were addressed during the hospitalization:  Choledocholithiasis: Presented with abnormal liver function test.  MRCP as above.  No abdominal pain, nausea or vomiting.  Admitted for ERCP.  GI following.   Gastroenterology attempted ERCP on 3/19, procedure was not successful.   Also found to have benign-appearing esophageal stenosis, esophagitis, duodenitis.  Started on PPI. Liver enzymes continue to improve. Status post laparoscopic cholecystectomy, placement of JP drain on 3/20.  Attempted IOC but was unsuccessful.    ERCP repeated with biliary sphincterotomy, balloon sweep placement of stent 3/24.  GI, general surgery cleared for discharge.   Leukocytosis/AKI: Both resolved.  Cultures have been negative so far.   Afebrile.  No abdomen pain, nausea, vomiting, antibiotic  changed to oral.   Diabetes type 2: Recent A1c of 7.8.  On metformin at home.     Hyperlipidemia: On Crestor.  Resume on discharge   Hypertension: On Norvasc, hydrochlorothiazide,benazipril at home.  Blood pressure stable.  Continue Norvasc and hydrochlorothiazide at home  Hypokalemia: Supplemented with potassium and corrected  Discharge Diagnoses:  Principal Problem:   Choledocholithiasis Active Problems:   Hypertension   Hyperlipidemia associated with type 2 diabetes mellitus (HCC)   Diabetes mellitus treated with oral medication (HCC)   Esophageal stricture   Duodenitis   Gastroesophageal reflux disease with esophagitis without hemorrhage   Bile leak   Abnormal LFTs    Discharge Instructions  Discharge Instructions     Diet - low sodium heart healthy   Complete by: As directed    Discharge instructions   Complete by: As directed    1)Please take prescribed medications as instructed 2)Follow up with general surgery and GI as an outpatient.  3)Monitor your blood pressure at home   Increase activity slowly   Complete by: As directed       Allergies as of 11/03/2023   No Known Allergies      Medication List     STOP taking these medications    benazepril 40 MG tablet Commonly known as: LOTENSIN       TAKE these medications    amLODipine 10 MG tablet Commonly known as: NORVASC Take 1 tablet (10 mg total) by mouth daily.   amoxicillin-clavulanate 875-125 MG tablet Commonly known as: AUGMENTIN Take 1 tablet by mouth every 12 (twelve) hours for 3 days.   aspirin EC 81 MG tablet Take 81 mg by mouth at bedtime.   cholecalciferol 1000 units tablet Commonly known as: VITAMIN D Take 2,000 Units by mouth at bedtime.   fish oil-omega-3 fatty acids  1000 MG capsule Take 1 g by mouth 2 (two) times daily.   hydrochlorothiazide 25 MG tablet Commonly known as: HYDRODIURIL Take 1 tablet (25 mg total) by mouth daily.   ibuprofen 400 MG tablet Commonly  known as: ADVIL Take 1 tablet (400 mg total) by mouth every 6 (six) hours as needed. What changed:  how much to take reasons to take this   metFORMIN 1000 MG tablet Commonly known as: GLUCOPHAGE Take 1 tablet (1,000 mg total) by mouth 2 (two) times daily with a meal. (NEEDS TO BE SEEN BEFORE NEXT REFILL)   omeprazole 40 MG capsule Commonly known as: PRILOSEC Take 1 capsule (40 mg total) by mouth daily. What changed:  when to take this reasons to take this   rosuvastatin 10 MG tablet Commonly known as: CRESTOR Take 1 tablet (10 mg total) by mouth daily. What changed: when to take this        Follow-up Information     Kinsinger, De Blanch, MD. Call.   Specialty: General Surgery Why: We are making a follow up appointment for you., Please call to confirm appointment time., Arrive 30 minutes early to complete check in, and bring photo ID and insurance card. Contact information: [address] [place] [address] [phone]         Bennie Pierini, FNP. Schedule an appointment as soon as possible for a visit in 1 week(s).   Specialty: Family Medicine Contact information: [address] [place] [address] [phone]                No Known Allergies  Consultations: GI, general surgery   Procedures/Studies: DG ERCP Result Date: 11/03/2023 CLINICAL DATA:  Status post cholecystectomy. Suspected choledocholithiasis by prior MRCP. EXAM: ERCP TECHNIQUE: Multiple spot images obtained with the fluoroscopic device and submitted for interpretation post-procedure. COMPARISON:  MRCP on 10/27/2023 FINDINGS: Imaging obtained with a C-arm demonstrates cholangiogram with normal caliber bile ducts. Balloon sweep maneuver was performed to remove calculi. Endoscopic stent was placed in the CBD due to presence of bile leak from the cystic duct stump. Surgical drain in place along the lateral aspect of the liver and extending into the gallbladder  fossa. IMPRESSION: Cholangiogram with balloon sweep maneuver to remove calculi. Endoscopic stent placed in the CBD due to presence of bile leak from the cystic duct stump. Electronically Signed   By: Irish Lack M.D.   On: 11/03/2023 10:10   DG Cholangiogram Operative Result Date: 10/31/2023 CLINICAL DATA:  Laparoscopic cholecystectomy with attempted intraoperative cholangiogram EXAM: INTRAOPERATIVE CHOLANGIOGRAM TECHNIQUE: Cholangiographic images from the C-arm fluoroscopic device were submitted for interpretation post-operatively. Please see the procedural report for the amount of contrast and the fluoroscopy time utilized. FLUOROSCOPY: Radiation Exposure Index (as provided by the fluoroscopic device): 2.46 mGy Kerma COMPARISON:  ERCP 10/28/2023; MRCP 10/27/2023 FINDINGS: Mom saved cine images are submitted for review. The images demonstrate a plastic stent in the pancreatic duct and laparoscopic ports in place. Contrast injection is performed through the cystic duct remnant, however injected contrast extravasates into the peritoneum. No visualization of the cystic or biliary tree. IMPRESSION: Unsuccessful attempted intraoperative cholangiogram. Injected contrast material extravasates into the peritoneal space. No visualization of the cystic or common bile ducts. Plastic pancreatic duct stent in place. Electronically Signed   By: Malachy Moan M.D.   On: 10/31/2023 07:50   DG C-Arm 1-60 Min-No Report Result Date: 10/29/2023 Fluoroscopy was utilized by the requesting physician.  No radiographic interpretation.   DG  C-Arm 1-60 Min-No Report Result Date: 10/29/2023 Fluoroscopy was utilized by the requesting physician.  No radiographic interpretation.   DG ERCP Result Date: 10/29/2023 CLINICAL DATA:  [address] Choledocholithiasis [address]147 EXAM: ERCP COMPARISON:  CT AP, 10/20/2023. MRCP, 10/27/2023. US Abdomen, 10/19/2023. FLUOROSCOPY: Exposure Index (as provided by the fluoroscopic device): 58.89 mGy  Kerma FINDINGS: Limited oblique planar images of the RIGHT upper quadrant obtained C-arm. Images demonstrating flexible endoscopy, biliary duct cannulation, retrograde cholangiogram and pancreatic stent placement. No overt biliary ductal dilation or discrete biliary filling defect is demonstrated. IMPRESSION: Fluoroscopic imaging for ERCP and pancreatic stent placement. For complete description of intra procedural findings, please see performing service dictation. Electronically Signed   By: Roanna Banning M.D.   On: 10/29/2023 07:08   DG C-Arm 1-60 Min-No Report Result Date: 10/28/2023 Fluoroscopy was utilized by the requesting physician.  No radiographic interpretation.   MR ABDOMEN MRCP W WO CONTAST Result Date: 10/27/2023 CLINICAL DATA:  Cholelithiasis, choledocholithiasis suspected by prior CT EXAM: MRI ABDOMEN WITHOUT AND WITH CONTRAST (INCLUDING MRCP) TECHNIQUE: Multiplanar multisequence MR imaging of the abdomen was performed both before and after the administration of intravenous contrast. Heavily T2-weighted images of the biliary and pancreatic ducts were obtained, and three-dimensional MRCP images were rendered by post processing. CONTRAST:  10mL GADAVIST GADOBUTROL 1 MMOL/ML IV SOLN COMPARISON:  CT abdomen pelvis, 10/20/2023 FINDINGS: Lower chest: No acute abnormality. Hepatobiliary: No solid liver abnormality is seen. Numerous gallstones contracted in the gallbladder or a gallbladder remnant (series 5, image 14). No gallbladder wall thickening. Multiple small gallstones and gravel throughout the cystic duct measuring up to 0.5 cm, as well as within the central common hepatic duct and common bile duct within the pancreatic head (series 5, image 15, series 8, image 70). Relatively low insertion of the cystic duct. Common hepatic duct is dilated up to 1.0 cm with intrahepatic ductal dilatation, similar to prior examination (series 3, image 17). Pancreas: Unremarkable. No pancreatic ductal dilatation or  surrounding inflammatory changes. Spleen: Normal in size without significant abnormality. Adrenals/Urinary Tract: Adrenal glands are unremarkable. Kidneys are normal, without renal calculi, solid lesion, or hydronephrosis. Stomach/Bowel: Stomach is within normal limits. Periampullary descending duodenal diverticulum. No evidence of bowel wall thickening, distention, or inflammatory changes. Vascular/Lymphatic: Aortic atherosclerosis. No enlarged abdominal lymph nodes. Other: No abdominal wall hernia or abnormality. No ascites. Musculoskeletal: No acute or significant osseous findings. IMPRESSION: 1. Numerous gallstones contracted in the gallbladder or a gallbladder remnant. No gallbladder wall thickening. 2. Multiple small gallstones and gravel throughout the cystic duct as well as within the central common hepatic duct and common bile duct within the pancreatic head. Calculi measure up to 0.5 cm. 3. Common hepatic duct is dilated up to 1.0 cm with intrahepatic ductal dilatation, similar to prior examination. 4. Periampullary descending duodenal diverticulum. Aortic Atherosclerosis (ICD10-I70.0). Electronically Signed   By: Jearld Lesch M.D.   On: 10/27/2023 14:05   MR 3D Recon At Scanner Result Date: 10/27/2023 CLINICAL DATA:  Cholelithiasis, choledocholithiasis suspected by prior CT EXAM: MRI ABDOMEN WITHOUT AND WITH CONTRAST (INCLUDING MRCP) TECHNIQUE: Multiplanar multisequence MR imaging of the abdomen was performed both before and after the administration of intravenous contrast. Heavily T2-weighted images of the biliary and pancreatic ducts were obtained, and three-dimensional MRCP images were rendered by post processing. CONTRAST:  10mL GADAVIST GADOBUTROL 1 MMOL/ML IV SOLN COMPARISON:  CT abdomen pelvis, 10/20/2023 FINDINGS: Lower chest: No acute abnormality. Hepatobiliary: No solid liver abnormality is seen. Numerous gallstones contracted in the gallbladder or a  gallbladder remnant (series 5, image 14).  No gallbladder wall thickening. Multiple small gallstones and gravel throughout the cystic duct measuring up to 0.5 cm, as well as within the central common hepatic duct and common bile duct within the pancreatic head (series 5, image 15, series 8, image 70). Relatively low insertion of the cystic duct. Common hepatic duct is dilated up to 1.0 cm with intrahepatic ductal dilatation, similar to prior examination (series 3, image 17). Pancreas: Unremarkable. No pancreatic ductal dilatation or surrounding inflammatory changes. Spleen: Normal in size without significant abnormality. Adrenals/Urinary Tract: Adrenal glands are unremarkable. Kidneys are normal, without renal calculi, solid lesion, or hydronephrosis. Stomach/Bowel: Stomach is within normal limits. Periampullary descending duodenal diverticulum. No evidence of bowel wall thickening, distention, or inflammatory changes. Vascular/Lymphatic: Aortic atherosclerosis. No enlarged abdominal lymph nodes. Other: No abdominal wall hernia or abnormality. No ascites. Musculoskeletal: No acute or significant osseous findings. IMPRESSION: 1. Numerous gallstones contracted in the gallbladder or a gallbladder remnant. No gallbladder wall thickening. 2. Multiple small gallstones and gravel throughout the cystic duct as well as within the central common hepatic duct and common bile duct within the pancreatic head. Calculi measure up to 0.5 cm. 3. Common hepatic duct is dilated up to 1.0 cm with intrahepatic ductal dilatation, similar to prior examination. 4. Periampullary descending duodenal diverticulum. Aortic Atherosclerosis (ICD10-I70.0). Electronically Signed   By: Jearld Lesch M.D.   On: 10/27/2023 14:05   CT ABDOMEN PELVIS W CONTRAST Result Date: 10/20/2023 CLINICAL DATA:  Elevated liver function tests. Abnormal ultrasound with probable gallstones. EXAM: CT ABDOMEN AND PELVIS WITH CONTRAST TECHNIQUE: Multidetector CT imaging of the abdomen and pelvis was performed  using the standard protocol following bolus administration of intravenous contrast. RADIATION DOSE REDUCTION: This exam was performed according to the departmental dose-optimization program which includes automated exposure control, adjustment of the mA and/or kV according to patient size and/or use of iterative reconstruction technique. CONTRAST:  OMNIPAQUE IOHEXOL 300 MG/ML  SOLN COMPARISON:  Ultrasound 10/19/2023.  No prior CT. FINDINGS: Lower chest: Emphysema. Mild cardiomegaly, without pericardial or pleural effusion. Aortic valve calcification. Right coronary artery atherosclerosis. Hepatobiliary: No suspicious liver lesion. The gallbladder is stone filled and contracted, including on 25/2. Possible pericholecystic interstitial thickening. 2 stones of up to 6 mm including on 27/2 and coronal image 52 are favored to be within the proximal common duct. There may also be stones within the cystic duct including on 51/5. Mild intrahepatic biliary duct dilatation. Pancreas: Normal, without mass or ductal dilatation. Spleen: Normal in size, without focal abnormality. Adrenals/Urinary Tract: Normal adrenal glands. Subcentimeter lower pole left renal lesion is too small to characterize but most likely a cyst . In the absence of clinically indicated signs/symptoms require(s) no independent follow-up. Normal right kidney. Extrarenal pelvis. No hydronephrosis. Median lobe impression into the urinary bladder. Stomach/Bowel: Normal stomach, without wall thickening. Periampullary duodenal diverticulum. Otherwise normal small bowel. Normal terminal ileum and appendix. Vascular/Lymphatic: Aortic atherosclerosis. No abdominopelvic adenopathy. Reproductive: Moderate prostatomegaly. Other: No significant free fluid. Small fat containing paraumbilical hernia. Musculoskeletal: Lumbosacral spondylosis. IMPRESSION: 1. Stone filled, contracted gallbladder. Findings suspicious for chronic cholecystitis. Cannot exclude mild  superimposed acute inflammation. 2. Choledocholithiasis with proximal common duct stones of up to 6 mm. Mild secondary intrahepatic biliary ductal dilatation. 3. Prostatomegaly 4. Coronary artery atherosclerosis. Aortic Atherosclerosis (ICD10-I70.0). Emphysema (ICD10-J43.9). 5. Aortic valvular calcifications. Consider echocardiography to evaluate for valvular dysfunction. Electronically Signed   By: Jeronimo Greaves M.D.   On: 10/20/2023 13:34   US Abdomen  Limited RUQ (LIVER/GB) Result Date: 10/19/2023 CLINICAL DATA:  elevated liver enzymes, elevated bilirubin EXAM: ULTRASOUND ABDOMEN LIMITED RIGHT UPPER QUADRANT COMPARISON:  October 28, 2019 FINDINGS: Gallbladder: Gallbladder is not confidently visualized due to extensive shadowing bowel gas and non NPO status. Gallbladder candidate on cine series 1 image slice 37 of 83 is favored to be contracted around multiple shadowing gallstones. Common bile duct: Diameter: Visualized portion measures 6 mm, within normal limits. Liver: No focal lesion identified. Mildly heterogeneous in parenchymal echogenicity. Prominence of the LEFT liver. Portal vein is patent on color Doppler imaging with normal direction of blood flow towards the liver. Other: None. IMPRESSION: 1. Gallbladder candidate is favored to be contracted around multiple shadowing gallstones. If further imaging is desired, recommend dedicated CT abdomen pelvis with contrast. Electronically Signed   By: Meda Klinefelter M.D.   On: 10/19/2023 15:21   DG Chest 2 View Result Date: 10/16/2023 CLINICAL DATA:  Night sweats. EXAM: CHEST - 2 VIEW COMPARISON:  Dec 11, 2022. FINDINGS: The heart size and mediastinal contours are within normal limits. Both lungs are clear. The visualized skeletal structures are unremarkable. IMPRESSION: No active cardiopulmonary disease. Electronically Signed   By: Lupita Raider M.D.   On: 10/16/2023 14:22      Subjective: Patient seen and examined at the bedside today.   Hemodynamically stable.  Comfortable.  No abdomen pain, nausea or vomiting.  Agreed to go home.  Medically stable for discharge  Discharge Exam: Vitals:   11/03/23 0802 11/03/23 0943  BP: (!) 128/59 (!) 142/73  Pulse: (!) 59   Resp: 18   Temp: 98.3 F (36.8 C)   SpO2: 96%    Vitals:   11/02/23 2029 11/03/23 0615 11/03/23 0802 11/03/23 0943  BP: 125/78 (!) 156/60 (!) 128/59 (!) 142/73  Pulse: 73 66 (!) 59   Resp: 18 18 18    Temp: 98.3 F (36.8 C) 97.8 F (36.6 C) 98.3 F (36.8 C)   TempSrc: Oral Oral    SpO2: 95% 97% 96%   Weight:      Height:        General: Pt is alert, awake, not in acute distress Cardiovascular: RRR, S1/S2 +, no rubs, no gallops Respiratory: CTA bilaterally, no wheezing, no rhonchi Abdominal: Soft, NT, ND, bowel sounds +,RUQ drain Extremities: no edema, no cyanosis    The results of significant diagnostics from this hospitalization (including imaging, microbiology, ancillary and laboratory) are listed below for reference.     Microbiology: Recent Results (from the past 240 hours)  Surgical PCR screen     Status: None   Collection Time: 10/29/23  9:50 AM   Specimen: Nasal Mucosa; Nasal Swab  Result Value Ref Range Status   MRSA, PCR NEGATIVE NEGATIVE Final   Staphylococcus aureus NEGATIVE NEGATIVE Final    Comment: (NOTE) The Xpert SA Assay (FDA approved for NASAL specimens in patients 37 years of age and older), is one component of a comprehensive surveillance program. It is not intended to diagnose infection nor to guide or monitor treatment. Performed at [facility], [address] [place], [place] [address]   Culture, blood (Routine X 2) w Reflex to ID Panel     Status: None (Preliminary result)   Collection Time: 10/31/23  8:41 AM   Specimen: BLOOD LEFT ARM  Result Value Ref Range Status   Specimen Description BLOOD LEFT ARM  Final   Special Requests   Final    BOTTLES DRAWN AEROBIC AND ANAEROBIC Blood  Culture results may not  be optimal due to an inadequate volume of blood received in culture bottles   Culture   Final    NO GROWTH 2 DAYS Performed at [facility], [address], [place], [place] [address]    Report Status PENDING  Incomplete  Culture, blood (Routine X 2) w Reflex to ID Panel     Status: None (Preliminary result)   Collection Time: 10/31/23  8:45 AM   Specimen: BLOOD RIGHT HAND  Result Value Ref Range Status   Specimen Description BLOOD RIGHT HAND  Final   Special Requests   Final    BOTTLES DRAWN AEROBIC AND ANAEROBIC Blood Culture results may not be optimal due to an inadequate volume of blood received in culture bottles   Culture   Final    NO GROWTH 2 DAYS Performed at [facility], [address], [place], [place] [address]    Report Status PENDING  Incomplete     Labs: BNP (last 3 results) No results for input(s): "BNP" in the last 8760 hours. Basic Metabolic Panel: Recent Labs  Lab 10/30/23 0441 10/31/23 0405 11/01/23 0419 11/02/23 1035 11/03/23 0546  NA 133* 134* 132* 134* 135  K 4.3 3.3* 3.2* 3.6 4.8  CL 101 98 97* 104 103  CO2 25 24 25 22  21*  GLUCOSE 226* 206* 161* 147* 219*  BUN 18 28* 20 16 21   CREATININE 1.22 1.64* 1.14 1.00 0.89  CALCIUM 8.9 9.1 8.3* 8.9 8.9   Liver Function Tests: Recent Labs  Lab 10/30/23 0441 10/31/23 0405 11/01/23 0419 11/02/23 1035 11/03/23 0546  AST 65* 32 18 24 19   ALT 132* 88* 55* 46* 41  ALKPHOS 283* 229* 174* 166* 157*  BILITOT 2.1* 2.9* 1.6* 1.3* 0.8  PROT 6.6 6.6 6.0* 6.1* 5.8*  ALBUMIN 3.1* 2.9* 2.4* 2.6* 2.4*   Recent Labs  Lab 10/27/23 1125  LIPASE 104*   No results for input(s): "AMMONIA" in the last 168 hours. CBC: Recent Labs  Lab 10/27/23 1125 10/28/23 0427 10/30/23 0441 10/31/23 0405 11/01/23 0419 11/02/23 1035 11/03/23 0546  WBC 8.0   < > 14.1* 15.5* 12.5* 9.6 10.9*  NEUTROABS 5.2  --   --   --   --   --   --   HGB 12.6*   < > 12.2* 11.5* 10.3* 10.6* 10.3*  HCT 38.5*   < > 37.1* 34.8*  31.1* 32.1* 31.2*  MCV 84.1   < > 82.6 82.5 82.5 83.6 82.1  PLT 281   < > 299 259 224 290 310   < > = values in this interval not displayed.   Cardiac Enzymes: No results for input(s): "CKTOTAL", "CKMB", "CKMBINDEX", "TROPONINI" in the last 168 hours. BNP: Invalid input(s): "POCBNP" CBG: Recent Labs  Lab 11/02/23 1634 11/02/23 2028 11/03/23 0056 11/03/23 0408 11/03/23 0802  GLUCAP 179* 297* 306* 203* 225*   D-Dimer No results for input(s): "DDIMER" in the last 72 hours. Hgb A1c No results for input(s): "HGBA1C" in the last 72 hours. Lipid Profile No results for input(s): "CHOL", "HDL", "LDLCALC", "TRIG", "CHOLHDL", "LDLDIRECT" in the last 72 hours. Thyroid function studies No results for input(s): "TSH", "T4TOTAL", "T3FREE", "THYROIDAB" in the last 72 hours.  Invalid input(s): "FREET3" Anemia work up No results for input(s): "VITAMINB12", "FOLATE", "FERRITIN", "TIBC", "IRON", "RETICCTPCT" in the last 72 hours. Urinalysis    Component Value Date/Time   COLORURINE YELLOW 05/01/2009 1230   APPEARANCEUR Clear 10/16/2023 1402   LABSPEC  1.010 05/01/2009 1230   PHURINE 6.5 05/01/2009 1230   GLUCOSEU Trace (A) 10/16/2023 1402   HGBUR NEGATIVE 05/01/2009 1230   BILIRUBINUR Negative 10/16/2023 1402   KETONESUR NEGATIVE 05/01/2009 1230   PROTEINUR Trace (A) 10/16/2023 1402   PROTEINUR NEGATIVE 05/01/2009 1230   UROBILINOGEN 0.2 05/01/2009 1230   NITRITE Negative 10/16/2023 1402   NITRITE NEGATIVE 05/01/2009 1230   LEUKOCYTESUR Negative 10/16/2023 1402   Sepsis Labs Recent Labs  Lab 10/31/23 0405 11/01/23 0419 11/02/23 1035 11/03/23 0546  WBC 15.5* 12.5* 9.6 10.9*   Microbiology Recent Results (from the past 240 hours)  Surgical PCR screen     Status: None   Collection Time: 10/29/23  9:50 AM   Specimen: Nasal Mucosa; Nasal Swab  Result Value Ref Range Status   MRSA, PCR NEGATIVE NEGATIVE Final   Staphylococcus aureus NEGATIVE NEGATIVE Final    Comment:  (NOTE) The Xpert SA Assay (FDA approved for NASAL specimens in patients 47 years of age and older), is one component of a comprehensive surveillance program. It is not intended to diagnose infection nor to guide or monitor treatment. Performed at [facility], [address], [place], [place] [address]   Culture, blood (Routine X 2) w Reflex to ID Panel     Status: None (Preliminary result)   Collection Time: 10/31/23  8:41 AM   Specimen: BLOOD LEFT ARM  Result Value Ref Range Status   Specimen Description BLOOD LEFT ARM  Final   Special Requests   Final    BOTTLES DRAWN AEROBIC AND ANAEROBIC Blood Culture results may not be optimal due to an inadequate volume of blood received in culture bottles   Culture   Final    NO GROWTH 2 DAYS Performed at [facility], [address], [place], [place] [address]    Report Status PENDING  Incomplete  Culture, blood (Routine X 2) w Reflex to ID Panel     Status: None (Preliminary result)   Collection Time: 10/31/23  8:45 AM   Specimen: BLOOD RIGHT HAND  Result Value Ref Range Status   Specimen Description BLOOD RIGHT HAND  Final   Special Requests   Final    BOTTLES DRAWN AEROBIC AND ANAEROBIC Blood Culture results may not be optimal due to an inadequate volume of blood received in culture bottles   Culture   Final    NO GROWTH 2 DAYS Performed at [facility], [address], [place], [place] [address]    Report Status PENDING  Incomplete    Please note: You were cared for by a hospitalist during your hospital stay. Once you are discharged, your primary care physician will handle any further medical issues. Please note that NO REFILLS for any discharge medications will be authorized once you are discharged, as it is imperative that you return to your primary care physician (or establish a relationship with a primary care physician if you do not have one) for your post hospital discharge needs so that they can reassess your  need for medications and monitor your lab values.    Time coordinating discharge: 40 minutes  SIGNED:   Burnadette Pop, MD  [facility] 11/03/2023, 10:19 AM Pager [phone]  If 7PM-7AM, please contact night-coverage [email or web address] Password [email or web address]

## 2023-11-03 NOTE — Progress Notes (Signed)
 Priinted and Reviewed AVS with patient and family all quesitons answered

## 2023-11-03 NOTE — Progress Notes (Signed)
 [facility] Progress Note  1 Day Post-Op  Subjective: CC:  Drainage seems to have decreased since ERCP. Tolerating FLD without n/v/abdominal pain.  Friend at bedside  Objective: Vital signs in last 24 hours: Temp:  [97.4 F (36.3 C)-98.3 F (36.8 C)] 98.3 F (36.8 C) (03/25 0802) Pulse Rate:  [59-82] 59 (03/25 0802) Resp:  [16-23] 18 (03/25 0802) BP: (118-156)/(56-78) 142/73 (03/25 0943) SpO2:  [92 %-97 %] 96 % (03/25 0802) Last BM Date : 11/02/23  Intake/Output from previous day: 03/24 0701 - 03/25 0700 In: 320 [I.V.:20; IV Piggyback:300] Out: 250 [Drains:250] Intake/Output this shift: No intake/output data recorded.  PE: Gen:  Alert, NAD, pleasant Pulm:  Normal effort ORA Abd: Soft, appropiately tender, no rebound tenderness or guarding, RUQ blake drain with scant bilious drainage  Skin: warm and dry, no rashes  Psych: A&Ox3   Lab Results:  Recent Labs    11/02/23 1035 11/03/23 0546  WBC 9.6 10.9*  HGB 10.6* 10.3*  HCT 32.1* 31.2*  PLT 290 310   BMET Recent Labs    11/02/23 1035 11/03/23 0546  NA 134* 135  K 3.6 4.8  CL 104 103  CO2 22 21*  GLUCOSE 147* 219*  BUN 16 21  CREATININE 1.00 0.89  CALCIUM 8.9 8.9   PT/INR No results for input(s): "LABPROT", "INR" in the last 72 hours. CMP     Component Value Date/Time   NA 135 11/03/2023 0546   NA 134 10/16/2023 1404   K 4.8 11/03/2023 0546   CL 103 11/03/2023 0546   CO2 21 (L) 11/03/2023 0546   GLUCOSE 219 (H) 11/03/2023 0546   BUN 21 11/03/2023 0546   BUN 17 10/16/2023 1404   CREATININE 0.89 11/03/2023 0546   CREATININE 1.05 02/18/2013 0916   CALCIUM 8.9 11/03/2023 0546   PROT 5.8 (L) 11/03/2023 0546   PROT 7.0 10/19/2023 1102   ALBUMIN 2.4 (L) 11/03/2023 0546   ALBUMIN 4.2 10/19/2023 1102   AST 19 11/03/2023 0546   ALT 41 11/03/2023 0546   ALKPHOS 157 (H) 11/03/2023 0546   BILITOT 0.8 11/03/2023 0546   BILITOT 1.2 10/19/2023 1102   GFRNONAA >60 11/03/2023 0546    GFRNONAA 75 02/18/2013 0916   GFRAA 79 08/14/2020 1020   GFRAA 86 02/18/2013 0916   Lipase     Component Value Date/Time   LIPASE 104 (H) 10/27/2023 1125       Studies/Results: No results found.   Anti-infectives: Anti-infectives (From admission, onward)    Start     Dose/Rate Route Frequency Ordered Stop   10/31/23 0800  metroNIDAZOLE (FLAGYL) IVPB 500 mg        500 mg 100 mL/hr over 60 Minutes Intravenous 2 times daily 10/31/23 0743     10/30/23 1730  cefTRIAXone (ROCEPHIN) 2 g in sodium chloride 0.9 % 100 mL IVPB       Note to Pharmacy: Pharmacy may adjust dosing strength / duration / interval for maximal efficacy   2 g 200 mL/hr over 30 Minutes Intravenous Every 24 hours 10/30/23 1644     10/28/23 1345  ampicillin-sulbactam (UNASYN) 1.5 g in sodium chloride 0.9 % 100 mL IVPB        1.5 g 200 mL/hr over 30 Minutes Intravenous 60 min pre-op 10/28/23 1132 10/28/23 1532        Assessment/Plan Choledocholithiasis - s/p unsuccessful ERCP 3/19. S/p ERCP with stone removal, sphincterotomy, stent placement 3/24 Dr. Meridee Score  Chronic cholecystitis  POD#5 s/p  laparoscopic cholecystectomy  and attempted IOC (unsuccessful attempt), placement JP drain 3/20 Dr. Sheliah Hatch - AFVSS, mild leukocytosis s/p ERCP. LFTs improved and t bili normal - empty JP drain frequently. JP to remain on dc for post-operative bile leak - melatonin, PRN benadryl for sleep  - BID stool softeners   Advance to soft diet  Stable for dc home from surgical standpoint. Will seen pain medication   LOS: 7 days   I reviewed nursing notes, Consultant GI notes, hospitalist notes, last 24 h vitals and pain scores, last 48 h intake and output, last 24 h labs and trends, and last 24 h imaging results.  Eric Form, [facility] 11/03/2023, 9:51 AM Please see Amion for pager number during day hours 7:00am-4:30pm

## 2023-11-04 ENCOUNTER — Telehealth: Payer: Self-pay | Admitting: *Deleted

## 2023-11-04 NOTE — Transitions of Care (Post Inpatient/ED Visit) (Signed)
   11/04/2023  Name: Jorge Garcia MRN: [id number] DOB: 1946/08/01  Today's TOC FU Call Status: Today's TOC FU Call Status:: Unsuccessful Call (1st Attempt) Unsuccessful Call (1st Attempt) Date: 11/04/23  Attempted to reach the patient regarding the most recent Inpatient/ED visit.  Follow Up Plan: Additional outreach attempts will be made to reach the patient to complete the Transitions of Care (Post Inpatient/ED visit) call.   Irving Shows [facility], BSN RN Care Manager/ Transition of Care [place]/ [facility] [phone]

## 2023-11-05 LAB — CULTURE, BLOOD (ROUTINE X 2)
Culture: NO GROWTH
Culture: NO GROWTH

## 2023-11-06 ENCOUNTER — Telehealth: Payer: Self-pay | Admitting: *Deleted

## 2023-11-06 NOTE — Transitions of Care (Post Inpatient/ED Visit) (Signed)
   11/06/2023  Name: Jorge Garcia MRN: [id number] DOB: 20-Jun-1946  Today's TOC FU Call Status: Today's TOC FU Call Status:: Unsuccessful Call (2nd Attempt) Unsuccessful Call (2nd Attempt) Date: 11/06/23  Attempted to reach the patient regarding the most recent Inpatient/ED visit.  Follow Up Plan: Additional outreach attempts will be made to reach the patient to complete the Transitions of Care (Post Inpatient/ED visit) call.   Irving Shows [facility], BSN RN Care Manager/ Transition of Care [place]/ [facility] [phone]

## 2023-11-09 ENCOUNTER — Telehealth: Payer: Self-pay | Admitting: *Deleted

## 2023-11-09 NOTE — Transitions of Care (Post Inpatient/ED Visit) (Signed)
   11/09/2023  Name: Ajdin Macke MRN: [id number] DOB: 1946/06/12  Today's TOC FU Call Status: Today's TOC FU Call Status:: Unsuccessful Call (3rd Attempt) Unsuccessful Call (3rd Attempt) Date: 11/09/23  Attempted to reach the patient regarding the most recent Inpatient/ED visit.  Follow Up Plan: No further outreach attempts will be made at this time. We have been unable to contact the patient.  Gean Maidens BSN RN [facility] Care Management Coordinator [email or web address] Direct Dial: [phone]  Fax: [phone] Website: [email or web address]

## 2023-11-10 ENCOUNTER — Ambulatory Visit (INDEPENDENT_AMBULATORY_CARE_PROVIDER_SITE_OTHER): Admitting: Nurse Practitioner

## 2023-11-10 ENCOUNTER — Encounter: Payer: Self-pay | Admitting: Nurse Practitioner

## 2023-11-10 VITALS — BP 145/83 | HR 84 | Temp 97.7°F | Ht 73.0 in | Wt 198.0 lb

## 2023-11-10 DIAGNOSIS — Z9049 Acquired absence of other specified parts of digestive tract: Secondary | ICD-10-CM | POA: Diagnosis not present

## 2023-11-10 NOTE — Progress Notes (Signed)
   Subjective:    Patient ID: Jorge Garcia, male    DOB: 1945-09-08, 78 y.o.   MRN: [id number]   Chief Complaint: hospital follow up  HPI  Patient had gall bladder removed and ERCP for gall stone lodged in GBD. He still has a drainage tube in place from infection in abdomen. He is doing much better. Still weak. Appetite improving. Patient Active Problem List   Diagnosis Date Noted   Bile leak 11/02/2023   Abnormal LFTs 11/02/2023   Esophageal stricture 10/28/2023   Duodenitis 10/28/2023   Gastroesophageal reflux disease with esophagitis without hemorrhage 10/28/2023   Choledocholithiasis 10/27/2023   Encounter for screening for lung cancer 10/28/2019   BMI 29.0-29.9,adult 07/19/2015   Gastroesophageal reflux disease without esophagitis 12/22/2014   Diabetes mellitus treated with oral medication (HCC) 06/12/2014   Erectile dysfunction associated with type 2 diabetes mellitus (HCC) 03/17/2014   Hypertension 11/14/2010   Hyperlipidemia associated with type 2 diabetes mellitus (HCC) 11/14/2010       Review of Systems  Constitutional:  Negative for diaphoresis.  Eyes:  Negative for pain.  Respiratory:  Negative for shortness of breath.   Cardiovascular:  Negative for chest pain, palpitations and leg swelling.  Gastrointestinal:  Negative for abdominal pain, nausea and vomiting.  Endocrine: Negative for polydipsia.  Skin:  Negative for rash.  Neurological:  Negative for dizziness, weakness and headaches.  Hematological:  Does not bruise/bleed easily.  All other systems reviewed and are negative.      Objective:   Physical Exam Constitutional:      Appearance: Normal appearance.  Cardiovascular:     Rate and Rhythm: Normal rate and regular rhythm.  Skin:    General: Skin is warm.     Comments: JP drain has scant amount of serosanguinous fluid in bullet. Insertion site normal appearing  Neurological:     General: No focal deficit present.     Mental Status: He is alert  and oriented to person, place, and time.  Psychiatric:        Mood and Affect: Mood normal.        Behavior: Behavior normal.    BP (!) 145/83   Pulse 84   Temp 97.7 F (36.5 C) (Temporal)   Ht 6\' 1"  (1.854 m)   Wt 198 lb (89.8 kg)   BMI 26.12 kg/m         Assessment & Plan:   Raunak Antuna in today with chief complaint of No chief complaint on file.   1. S/P laparoscopic cholecystectomy (Primary) Rest Force fluids Keep follow up with surgeon RTO prn    The above assessment and management plan was discussed with the patient. The patient verbalized understanding of and has agreed to the management plan. Patient is aware to call the clinic if symptoms persist or worsen. Patient is aware when to return to the clinic for a follow-up visit. Patient educated on when it is appropriate to go to the emergency department.   Mary-Margaret Daphine Deutscher, FNP

## 2023-11-10 NOTE — Telephone Encounter (Signed)
ERCP scheduled, pt instructed and medications reviewed.  Patient instructions mailed to home.  Patient to call with any questions or concerns.  

## 2023-11-10 NOTE — Telephone Encounter (Signed)
 ERCP has been set up for 01/07/24 at 8 am at [facility] with GM

## 2023-11-13 ENCOUNTER — Telehealth: Payer: Self-pay

## 2023-11-13 NOTE — Telephone Encounter (Signed)
 Copied from CRM [id number]. Topic: General - Other >> Nov 13, 2023  3:57 PM Dondra Prader E wrote: Reason for CRM: Herbert Seta, a pharmacist from [facility], called requesting to speak to the clinic regarding several issues during post discharge reconciliation. Please advise   Best contact: [phone] anyone will be able to assist.

## 2023-11-13 NOTE — Telephone Encounter (Signed)
 Patient just seen in office for hospital follow up and meds were reviewed

## 2023-11-16 ENCOUNTER — Ambulatory Visit (INDEPENDENT_AMBULATORY_CARE_PROVIDER_SITE_OTHER)

## 2023-11-16 DIAGNOSIS — R7989 Other specified abnormal findings of blood chemistry: Secondary | ICD-10-CM

## 2023-11-16 DIAGNOSIS — E538 Deficiency of other specified B group vitamins: Secondary | ICD-10-CM

## 2023-11-16 NOTE — Progress Notes (Signed)
 Patient is in office today for a nurse visit for B12 Injection. Patient Injection was given in the  Right deltoid. Patient tolerated injection well.

## 2023-12-02 ENCOUNTER — Other Ambulatory Visit: Payer: Self-pay

## 2023-12-02 DIAGNOSIS — K839 Disease of biliary tract, unspecified: Secondary | ICD-10-CM

## 2023-12-02 DIAGNOSIS — K831 Obstruction of bile duct: Secondary | ICD-10-CM

## 2023-12-10 ENCOUNTER — Encounter: Payer: Self-pay | Admitting: Nurse Practitioner

## 2023-12-10 ENCOUNTER — Ambulatory Visit: Payer: Medicare HMO | Admitting: Nurse Practitioner

## 2023-12-10 ENCOUNTER — Ambulatory Visit: Admitting: Nurse Practitioner

## 2023-12-10 VITALS — BP 116/61 | HR 66 | Temp 97.2°F | Ht 73.0 in | Wt 204.0 lb

## 2023-12-10 DIAGNOSIS — Z6829 Body mass index (BMI) 29.0-29.9, adult: Secondary | ICD-10-CM

## 2023-12-10 DIAGNOSIS — E1169 Type 2 diabetes mellitus with other specified complication: Secondary | ICD-10-CM | POA: Diagnosis not present

## 2023-12-10 DIAGNOSIS — R6889 Other general symptoms and signs: Secondary | ICD-10-CM | POA: Diagnosis not present

## 2023-12-10 DIAGNOSIS — K219 Gastro-esophageal reflux disease without esophagitis: Secondary | ICD-10-CM | POA: Diagnosis not present

## 2023-12-10 DIAGNOSIS — I10 Essential (primary) hypertension: Secondary | ICD-10-CM

## 2023-12-10 DIAGNOSIS — E119 Type 2 diabetes mellitus without complications: Secondary | ICD-10-CM

## 2023-12-10 DIAGNOSIS — Z7984 Long term (current) use of oral hypoglycemic drugs: Secondary | ICD-10-CM | POA: Diagnosis not present

## 2023-12-10 DIAGNOSIS — E785 Hyperlipidemia, unspecified: Secondary | ICD-10-CM | POA: Diagnosis not present

## 2023-12-10 LAB — LIPID PANEL

## 2023-12-10 LAB — BAYER DCA HB A1C WAIVED: HB A1C (BAYER DCA - WAIVED): 7.5 % — ABNORMAL HIGH (ref 4.8–5.6)

## 2023-12-10 MED ORDER — AMLODIPINE BESYLATE 10 MG PO TABS
10.0000 mg | ORAL_TABLET | Freq: Every day | ORAL | 1 refills | Status: DC
Start: 2023-12-10 — End: 2024-03-17

## 2023-12-10 MED ORDER — PANTOPRAZOLE SODIUM 40 MG PO TBEC: 40.0000 mg | DELAYED_RELEASE_TABLET | Freq: Two times a day (BID) | ORAL | 0 refills | Status: DC

## 2023-12-10 MED ORDER — METFORMIN HCL 1000 MG PO TABS: 1000.0000 mg | ORAL_TABLET | Freq: Two times a day (BID) | ORAL | 1 refills | Status: DC

## 2023-12-10 MED ORDER — ROSUVASTATIN CALCIUM 10 MG PO TABS: 10.0000 mg | ORAL_TABLET | Freq: Every evening | ORAL | 1 refills | Status: DC

## 2023-12-10 MED ORDER — HYDROCHLOROTHIAZIDE 25 MG PO TABS: 25.0000 mg | ORAL_TABLET | Freq: Every day | ORAL | 1 refills | Status: DC

## 2023-12-10 NOTE — Progress Notes (Signed)
 Subjective:    Patient ID: Jorge Garcia, male    DOB: 1946-05-19, 78 y.o.   MRN: [id number]   Chief Complaint: annual physical    HPI:  Jorge Garcia is a 78 y.o. who identifies as a male who was assigned male at birth.   Social history: Lives with: by hisself Work history: retired   Water engineer in today for follow up of the following chronic medical issues:  1. Primary hypertension No c/o chest pain, sob or headache. Does not check blood pressure at home. BP Readings from Last 3 Encounters:  11/10/23 (!) 145/83  11/03/23 (!) 142/73  10/27/23 119/71     2. Diabetes mellitus treated with oral medication (HCC) Very seldom checks his blood sugars Lab Results  Component Value Date   HGBA1C 7.8 (H) 10/16/2023     3. Hyperlipidemia associated with type 2 diabetes mellitus (HCC) Does not watch diet and does no exercise. Lab Results  Component Value Date   CHOL 86 (L) 06/12/2023   HDL 37 (L) 06/12/2023   LDLCALC 33 06/12/2023   TRIG 79 06/12/2023   CHOLHDL 2.3 06/12/2023     4. Gastroesophageal reflux disease without esophagitis Is on omperazole and is doing well.  5. BMI 29.0-29.9,adult Weight is up 6lbs  Wt Readings from Last 3 Encounters:  12/10/23 204 lb (92.5 kg)  11/10/23 198 lb (89.8 kg)  10/29/23 202 lb (91.6 kg)   BMI Readings from Last 3 Encounters:  12/10/23 26.91 kg/m  11/10/23 26.12 kg/m  10/29/23 26.65 kg/m       New complaints: None today  No Known Allergies Outpatient Encounter Medications as of 12/10/2023  Medication Sig   acetaminophen  (TYLENOL ) 325 MG tablet Take 2 tablets (650 mg total) by mouth every 6 (six) hours as needed for mild pain (pain score 1-3).   amLODipine  (NORVASC ) 10 MG tablet Take 1 tablet (10 mg total) by mouth daily.   aspirin EC 81 MG tablet Take 81 mg by mouth at bedtime.   cholecalciferol (VITAMIN D ) 1000 UNITS tablet Take 2,000 Units by mouth at bedtime.   docusate sodium  (COLACE) 100 MG capsule Take 1  capsule (100 mg total) by mouth 2 (two) times daily as needed for mild constipation or moderate constipation.   fish oil-omega-3 fatty acids 1000 MG capsule Take 1 g by mouth 2 (two) times daily.   hydrochlorothiazide  (HYDRODIURIL ) 25 MG tablet Take 1 tablet (25 mg total) by mouth daily.   ibuprofen  (ADVIL ) 400 MG tablet Take 1 tablet (400 mg total) by mouth every 6 (six) hours as needed. (Patient taking differently: Take 200-800 mg by mouth every 6 (six) hours as needed for moderate pain (pain score 4-6).)   metFORMIN  (GLUCOPHAGE ) 1000 MG tablet Take 1 tablet (1,000 mg total) by mouth 2 (two) times daily with a meal. (NEEDS TO BE SEEN BEFORE NEXT REFILL)   pantoprazole  (PROTONIX ) 40 MG tablet Take 1 tablet (40 mg total) by mouth 2 (two) times daily before a meal.   polyethylene glycol (MIRALAX  / GLYCOLAX ) 17 g packet Take 17 g by mouth daily as needed for moderate constipation.   rosuvastatin  (CRESTOR ) 10 MG tablet Take 1 tablet (10 mg total) by mouth daily. (Patient taking differently: Take 10 mg by mouth at bedtime.)   Facility-Administered Encounter Medications as of 12/10/2023  Medication   cyanocobalamin  (VITAMIN B12) injection 1,000 mcg    Past Surgical History:  Procedure Laterality Date   BILIARY STENT PLACEMENT  11/02/2023   Procedure: INSERTION, STENT,  BILE DUCT;  Surgeon: Brice Campi Albino Alu., MD;  Location: [facility] ENDOSCOPY;  Service: Gastroenterology;;   BIOPSY OF SKIN SUBCUTANEOUS TISSUE AND/OR MUCOUS MEMBRANE  10/28/2023   Procedure: BIOPSY;  Surgeon: Kenney Peacemaker, MD;  Location: [facility] ENDOSCOPY;  Service: Gastroenterology;;   CATARACT EXTRACTION, BILATERAL Bilateral    CHOLECYSTECTOMY N/A 10/29/2023   Procedure: LAPAROSCOPIC CHOLECYSTECTOMY WITH INTRAOPERATIVE CHOLANGIOGRAM;  Surgeon: Derral Flick, MD;  Location: [facility] OR;  Service: General;  Laterality: N/A;   COLONOSCOPY  12/01/2013   COLONOSCOPY  2020   MS-MAC-suprep(good)-tics/hems/HPPx1/TAx4   COLONOSCOPY  04/22/2022    ERCP N/A 10/28/2023   Procedure: ERCP, WITH INTERVENTION IF INDICATED;  Surgeon: Kenney Peacemaker, MD;  Location: [facility] ENDOSCOPY;  Service: Gastroenterology;  Laterality: N/A;   ERCP N/A 11/02/2023   Procedure: ERCP, WITH INTERVENTION IF INDICATED;  Surgeon: Brice Campi Albino Alu., MD;  Location: [facility] ENDOSCOPY;  Service: Gastroenterology;  Laterality: N/A;   ESOPHAGOGASTRODUODENOSCOPY N/A 10/28/2023   Procedure: EGD (ESOPHAGOGASTRODUODENOSCOPY);  Surgeon: Kenney Peacemaker, MD;  Location: [facility] ENDOSCOPY;  Service: Gastroenterology;  Laterality: N/A;   HEMORRHOID SURGERY  1978   PANCREATIC STENT PLACEMENT  10/28/2023   Procedure: INSERTION, STENT, PANCREATIC DUCT;  Surgeon: Kenney Peacemaker, MD;  Location: [facility] ENDOSCOPY;  Service: Gastroenterology;;   POLYPECTOMY  2020   HPPx1/TAx4   STONE EXTRACTION WITH BASKET  11/02/2023   Procedure: ERCP, WITH LITHROTRIPSY OR REMOVAL OF COMMON BILE DUCT CALCULUS;  Surgeon: Mansouraty, Albino Alu., MD;  Location: [facility] ENDOSCOPY;  Service: Gastroenterology;;   UPPER GASTROINTESTINAL ENDOSCOPY     WISDOM TOOTH EXTRACTION      Family History  Problem Relation Age of Onset   Diabetes Mother    Hypertension Mother    Stroke Mother 60   Diabetes Father    Hypertension Father    Heart attack Father 45   Colon cancer Neg Hx    Colon polyps Neg Hx    Esophageal cancer Neg Hx    Stomach cancer Neg Hx    Rectal cancer Neg Hx       Controlled substance contract: n/a     Review of Systems  Constitutional:  Negative for diaphoresis.  Eyes:  Negative for pain.  Respiratory:  Negative for shortness of breath.   Cardiovascular:  Negative for chest pain, palpitations and leg swelling.  Gastrointestinal:  Negative for abdominal pain.  Endocrine: Negative for polydipsia.  Skin:  Negative for rash.  Neurological:  Negative for dizziness, weakness and headaches.  Hematological:  Does not bruise/bleed easily.  All other systems reviewed and are negative.       Objective:   Physical Exam Vitals and nursing note reviewed.  Constitutional:      Appearance: Normal appearance. He is well-developed.  HENT:     Head: Normocephalic.     Nose: Nose normal.     Mouth/Throat:     Mouth: Mucous membranes are moist.     Pharynx: Oropharynx is clear.  Eyes:     Pupils: Pupils are equal, round, and reactive to light.  Neck:     Thyroid : No thyroid  mass or thyromegaly.     Vascular: No carotid bruit or JVD.     Trachea: Phonation normal.  Cardiovascular:     Rate and Rhythm: Normal rate and regular rhythm.  Pulmonary:     Effort: Pulmonary effort is normal. No respiratory distress.     Breath sounds: Normal breath sounds.  Abdominal:     General: Bowel sounds are normal.  Palpations: Abdomen is soft.     Tenderness: There is no abdominal tenderness.  Musculoskeletal:        General: Normal range of motion.     Cervical back: Normal range of motion and neck supple.  Lymphadenopathy:     Cervical: No cervical adenopathy.  Skin:    General: Skin is warm and dry.  Neurological:     Mental Status: He is alert and oriented to person, place, and time.  Psychiatric:        Behavior: Behavior normal.        Thought Content: Thought content normal.        Judgment: Judgment normal.    BP 116/61   Pulse 66   Temp (!) 97.2 F (36.2 C) (Temporal)   Ht 6\' 1"  (1.854 m)   Wt 204 lb (92.5 kg)   SpO2 95%   BMI 26.91 kg/m     Hgba1c 7.5%     Assessment & Plan:   Hilbert Tomaro comes in today with chief complaint of annual physical  Diagnosis and orders addressed:  1. Primary hypertension Low sodium diet - CBC with Differential/Platelet - CMP14+EGFR - amLODipine  (NORVASC ) 10 MG tablet; Take 1 tablet (10 mg total) by mouth daily.  Dispense: 90 tablet; Refill: 1 - benazepril  (LOTENSIN ) 40 MG tablet; Take 1 tablet (40 mg total) by mouth daily.  Dispense: 90 tablet; Refill: 1 - hydrochlorothiazide  (HYDRODIURIL ) 25 MG tablet; Take 1 tablet  (25 mg total) by mouth daily.  Dispense: 90 tablet; Refill: 1  2. Diabetes mellitus treated with oral medication (HCC) Continue to watch carbs in diet - Bayer DCA Hb A1c Waived - Microalbumin / creatinine urine ratio - metFORMIN  (GLUCOPHAGE ) 1000 MG tablet; Take 1 tablet (1,000 mg total) by mouth 2 (two) times daily with a meal. (NEEDS TO BE SEEN BEFORE NEXT REFILL)  Dispense: 180 tablet; Refill: 1  3. Hyperlipidemia associated with type 2 diabetes mellitus (HCC) Low fat diet - Lipid panel - rosuvastatin  (CRESTOR ) 10 MG tablet; Take 1 tablet (10 mg total) by mouth daily.  Dispense: 90 tablet; Refill: 1  4. Gastroesophageal reflux disease without esophagitis Avoid spicy foods Do not eat 2 hours prior to bedtime - omeprazole  (PRILOSEC) 40 MG capsule; Take 1 capsule (40 mg total) by mouth daily.  Dispense: 90 capsule; Refill: 1  5. BMI 29.0-29.9,adult Discussed diet and exercise for person with BMI >25 Will recheck weight in 3-6 months    Labs pending Health Maintenance reviewed Diet and exercise encouraged  Follow up plan: 6 months   Mary-Margaret Gaylyn Keas, FNP

## 2023-12-10 NOTE — Patient Instructions (Signed)

## 2023-12-11 LAB — CBC WITH DIFFERENTIAL/PLATELET
Basophils Absolute: 0.1 10*3/uL (ref 0.0–0.2)
Basos: 1 %
EOS (ABSOLUTE): 0.6 10*3/uL — ABNORMAL HIGH (ref 0.0–0.4)
Eos: 9 %
Hematocrit: 38.2 % (ref 37.5–51.0)
Hemoglobin: 12.6 g/dL — ABNORMAL LOW (ref 13.0–17.7)
Immature Grans (Abs): 0 10*3/uL (ref 0.0–0.1)
Immature Granulocytes: 0 %
Lymphocytes Absolute: 2.1 10*3/uL (ref 0.7–3.1)
Lymphs: 29 %
MCH: 26.5 pg — ABNORMAL LOW (ref 26.6–33.0)
MCHC: 33 g/dL (ref 31.5–35.7)
MCV: 80 fL (ref 79–97)
Monocytes Absolute: 0.5 10*3/uL (ref 0.1–0.9)
Monocytes: 7 %
Neutrophils Absolute: 3.9 10*3/uL (ref 1.4–7.0)
Neutrophils: 54 %
Platelets: 271 10*3/uL (ref 150–450)
RBC: 4.76 x10E6/uL (ref 4.14–5.80)
RDW: 13.2 % (ref 11.6–15.4)
WBC: 7.2 10*3/uL (ref 3.4–10.8)

## 2023-12-11 LAB — LIPID PANEL
Chol/HDL Ratio: 2.8 ratio (ref 0.0–5.0)
Cholesterol, Total: 121 mg/dL (ref 100–199)
HDL: 44 mg/dL (ref >39)
LDL Chol Calc (NIH): 57 mg/dL (ref 0–99)
Triglycerides: 107 mg/dL (ref 0–149)
VLDL Cholesterol Cal: 20 mg/dL (ref 5–40)

## 2023-12-11 LAB — CMP14+EGFR
ALT: 15 IU/L (ref 0–44)
AST: 16 IU/L (ref 0–40)
Albumin: 4.2 g/dL (ref 3.8–4.8)
Alkaline Phosphatase: 73 IU/L (ref 44–121)
BUN/Creatinine Ratio: 15 (ref 10–24)
BUN: 15 mg/dL (ref 8–27)
Bilirubin Total: 1.3 mg/dL — ABNORMAL HIGH (ref 0.0–1.2)
CO2: 23 mmol/L (ref 20–29)
Calcium: 10.1 mg/dL (ref 8.6–10.2)
Chloride: 105 mmol/L (ref 96–106)
Creatinine, Ser: 0.99 mg/dL (ref 0.76–1.27)
Globulin, Total: 2.6 g/dL (ref 1.5–4.5)
Glucose: 172 mg/dL — ABNORMAL HIGH (ref 70–99)
Potassium: 5.1 mmol/L (ref 3.5–5.2)
Sodium: 143 mmol/L (ref 134–144)
Total Protein: 6.8 g/dL (ref 6.0–8.5)
eGFR: 79 mL/min/{1.73_m2} (ref >59)

## 2023-12-11 LAB — VITAMIN B12: Vitamin B-12: 360 pg/mL (ref 232–1245)

## 2023-12-18 ENCOUNTER — Ambulatory Visit

## 2023-12-30 ENCOUNTER — Telehealth: Payer: Self-pay | Admitting: Gastroenterology

## 2023-12-30 ENCOUNTER — Encounter (HOSPITAL_COMMUNITY): Payer: Self-pay | Admitting: Gastroenterology

## 2023-12-30 NOTE — Telephone Encounter (Addendum)
 Procedure:ERCP Procedure date: 01/07/24 Procedure location: [facility] Arrival Time: 6:30 am Spoke with the patient Y/N:   No, I left a detailed message 12/30/23 @ 10:19 am for the patient to return call  Yes, 12/30/23   Any prep concerns? No  Has the patient obtained the prep from the pharmacy ? No prep needed Do you have a care partner and transportation: Yes Any additional concerns? No

## 2023-12-30 NOTE — Telephone Encounter (Signed)
 Patient returning call.

## 2023-12-31 DIAGNOSIS — M79676 Pain in unspecified toe(s): Secondary | ICD-10-CM | POA: Diagnosis not present

## 2023-12-31 DIAGNOSIS — B351 Tinea unguium: Secondary | ICD-10-CM | POA: Diagnosis not present

## 2023-12-31 DIAGNOSIS — E1142 Type 2 diabetes mellitus with diabetic polyneuropathy: Secondary | ICD-10-CM | POA: Diagnosis not present

## 2023-12-31 DIAGNOSIS — L84 Corns and callosities: Secondary | ICD-10-CM | POA: Diagnosis not present

## 2024-01-07 ENCOUNTER — Ambulatory Visit (HOSPITAL_COMMUNITY)

## 2024-01-07 ENCOUNTER — Telehealth: Payer: Self-pay

## 2024-01-07 ENCOUNTER — Encounter (HOSPITAL_COMMUNITY): Payer: Self-pay | Admitting: Gastroenterology

## 2024-01-07 ENCOUNTER — Other Ambulatory Visit: Payer: Self-pay

## 2024-01-07 ENCOUNTER — Ambulatory Visit (HOSPITAL_COMMUNITY): Payer: Self-pay | Admitting: Registered Nurse

## 2024-01-07 ENCOUNTER — Ambulatory Visit (HOSPITAL_COMMUNITY)
Admission: RE | Admit: 2024-01-07 | Discharge: 2024-01-07 | Disposition: A | Discharge status: Home / Self Care | Attending: Gastroenterology | Admitting: Gastroenterology

## 2024-01-07 ENCOUNTER — Ambulatory Visit (HOSPITAL_BASED_OUTPATIENT_CLINIC_OR_DEPARTMENT_OTHER): Payer: Self-pay | Admitting: Registered Nurse

## 2024-01-07 ENCOUNTER — Encounter (HOSPITAL_COMMUNITY)
Admission: RE | Disposition: A | Discharge status: Home / Self Care | Payer: Self-pay | Source: Home / Self Care | Attending: Gastroenterology

## 2024-01-07 DIAGNOSIS — Z9049 Acquired absence of other specified parts of digestive tract: Secondary | ICD-10-CM | POA: Diagnosis not present

## 2024-01-07 DIAGNOSIS — Z833 Family history of diabetes mellitus: Secondary | ICD-10-CM | POA: Insufficient documentation

## 2024-01-07 DIAGNOSIS — M1990 Unspecified osteoarthritis, unspecified site: Secondary | ICD-10-CM | POA: Diagnosis not present

## 2024-01-07 DIAGNOSIS — K838 Other specified diseases of biliary tract: Secondary | ICD-10-CM

## 2024-01-07 DIAGNOSIS — Z87891 Personal history of nicotine dependence: Secondary | ICD-10-CM | POA: Insufficient documentation

## 2024-01-07 DIAGNOSIS — K831 Obstruction of bile duct: Secondary | ICD-10-CM

## 2024-01-07 DIAGNOSIS — E119 Type 2 diabetes mellitus without complications: Secondary | ICD-10-CM | POA: Diagnosis not present

## 2024-01-07 DIAGNOSIS — Z4689 Encounter for fitting and adjustment of other specified devices: Secondary | ICD-10-CM | POA: Diagnosis not present

## 2024-01-07 DIAGNOSIS — K8020 Calculus of gallbladder without cholecystitis without obstruction: Secondary | ICD-10-CM | POA: Diagnosis not present

## 2024-01-07 DIAGNOSIS — X58XXXA Exposure to other specified factors, initial encounter: Secondary | ICD-10-CM | POA: Diagnosis not present

## 2024-01-07 DIAGNOSIS — T182XXA Foreign body in stomach, initial encounter: Secondary | ICD-10-CM | POA: Insufficient documentation

## 2024-01-07 DIAGNOSIS — Z4659 Encounter for fitting and adjustment of other gastrointestinal appliance and device: Secondary | ICD-10-CM

## 2024-01-07 DIAGNOSIS — T884XXA Failed or difficult intubation, initial encounter: Secondary | ICD-10-CM

## 2024-01-07 DIAGNOSIS — K219 Gastro-esophageal reflux disease without esophagitis: Secondary | ICD-10-CM | POA: Insufficient documentation

## 2024-01-07 DIAGNOSIS — Z8249 Family history of ischemic heart disease and other diseases of the circulatory system: Secondary | ICD-10-CM | POA: Insufficient documentation

## 2024-01-07 DIAGNOSIS — K839 Disease of biliary tract, unspecified: Secondary | ICD-10-CM

## 2024-01-07 DIAGNOSIS — K449 Diaphragmatic hernia without obstruction or gangrene: Secondary | ICD-10-CM

## 2024-01-07 DIAGNOSIS — I10 Essential (primary) hypertension: Secondary | ICD-10-CM | POA: Diagnosis not present

## 2024-01-07 DIAGNOSIS — K8050 Calculus of bile duct without cholangitis or cholecystitis without obstruction: Secondary | ICD-10-CM | POA: Diagnosis not present

## 2024-01-07 DIAGNOSIS — K269 Duodenal ulcer, unspecified as acute or chronic, without hemorrhage or perforation: Secondary | ICD-10-CM | POA: Diagnosis not present

## 2024-01-07 HISTORY — PX: STENT REMOVAL: SHX6421

## 2024-01-07 HISTORY — PX: FOREIGN BODY REMOVAL: SHX962

## 2024-01-07 HISTORY — PX: ESOPHAGOGASTRODUODENOSCOPY: SHX5428

## 2024-01-07 HISTORY — DX: Failed or difficult intubation, initial encounter: T88.4XXA

## 2024-01-07 HISTORY — PX: ERCP: SHX5425

## 2024-01-07 LAB — GLUCOSE, CAPILLARY: Glucose-Capillary: 139 mg/dL — ABNORMAL HIGH (ref 70–99)

## 2024-01-07 SURGERY — ERCP, WITH INTERVENTION IF INDICATED
Anesthesia: General

## 2024-01-07 MED ORDER — LACTATED RINGERS IV SOLN
INTRAVENOUS | Status: AC | PRN
  Administered 2024-01-07: 1000 mL via INTRAVENOUS

## 2024-01-07 MED ORDER — SODIUM CHLORIDE 0.9 % IV SOLN: INTRAVENOUS | Status: DC

## 2024-01-07 MED ORDER — CIPROFLOXACIN IN D5W 400 MG/200ML IV SOLN
INTRAVENOUS | Status: DC | PRN
  Administered 2024-01-07: 400 mg via INTRAVENOUS

## 2024-01-07 MED ORDER — ONDANSETRON HCL 4 MG/2ML IJ SOLN
INTRAMUSCULAR | Status: DC | PRN
Start: 2024-01-07 — End: 2024-01-07
  Administered 2024-01-07: 4 mg via INTRAVENOUS

## 2024-01-07 MED ORDER — EPHEDRINE SULFATE-NACL 50-0.9 MG/10ML-% IV SOSY
PREFILLED_SYRINGE | INTRAVENOUS | Status: DC | PRN
  Administered 2024-01-07: 5 mg via INTRAVENOUS

## 2024-01-07 MED ORDER — ROCURONIUM BROMIDE 10 MG/ML (PF) SYRINGE
PREFILLED_SYRINGE | INTRAVENOUS | Status: DC | PRN
  Administered 2024-01-07: 50 mg via INTRAVENOUS

## 2024-01-07 MED ORDER — PROPOFOL 10 MG/ML IV BOLUS
INTRAVENOUS | Status: DC | PRN
Start: 2024-01-07 — End: 2024-01-07
  Administered 2024-01-07: 120 mg via INTRAVENOUS

## 2024-01-07 MED ORDER — PANTOPRAZOLE SODIUM 40 MG PO TBEC: 40.0000 mg | DELAYED_RELEASE_TABLET | Freq: Two times a day (BID) | ORAL | 6 refills | Status: DC

## 2024-01-07 MED ORDER — LIDOCAINE 2% (20 MG/ML) 5 ML SYRINGE
INTRAMUSCULAR | Status: DC | PRN
  Administered 2024-01-07: 40 mg via INTRAVENOUS

## 2024-01-07 MED ORDER — GLUCAGON HCL RDNA (DIAGNOSTIC) 1 MG IJ SOLR
INTRAMUSCULAR | Status: AC
  Filled 2024-01-07: qty 2

## 2024-01-07 MED ORDER — DICLOFENAC SUPPOSITORY 100 MG
RECTAL | Status: DC | PRN
  Administered 2024-01-07: 100 mg via RECTAL

## 2024-01-07 MED ORDER — SODIUM CHLORIDE 0.9 % IV SOLN
INTRAVENOUS | Status: DC | PRN
  Administered 2024-01-07: 15 mL

## 2024-01-07 MED ORDER — DEXAMETHASONE SODIUM PHOSPHATE 10 MG/ML IJ SOLN
INTRAMUSCULAR | Status: DC | PRN
  Administered 2024-01-07: 6 mg via INTRAVENOUS

## 2024-01-07 MED ORDER — SUCRALFATE 1 G PO TABS: 1.0000 g | ORAL_TABLET | Freq: Two times a day (BID) | ORAL | 0 refills | Status: DC

## 2024-01-07 MED ORDER — FENTANYL CITRATE (PF) 100 MCG/2ML IJ SOLN
INTRAMUSCULAR | Status: AC
  Filled 2024-01-07: qty 2

## 2024-01-07 MED ORDER — PROPOFOL 500 MG/50ML IV EMUL
INTRAVENOUS | Status: AC
  Filled 2024-01-07: qty 50

## 2024-01-07 MED ORDER — SUGAMMADEX SODIUM 200 MG/2ML IV SOLN
INTRAVENOUS | Status: DC | PRN
  Administered 2024-01-07: 200 mg via INTRAVENOUS

## 2024-01-07 MED ORDER — CIPROFLOXACIN IN D5W 400 MG/200ML IV SOLN
INTRAVENOUS | Status: AC
  Filled 2024-01-07: qty 200

## 2024-01-07 MED ORDER — FENTANYL CITRATE (PF) 100 MCG/2ML IJ SOLN
INTRAMUSCULAR | Status: DC | PRN
  Administered 2024-01-07: 50 ug via INTRAVENOUS

## 2024-01-07 MED ORDER — DICLOFENAC SUPPOSITORY 100 MG
RECTAL | Status: AC
  Filled 2024-01-07: qty 1

## 2024-01-07 NOTE — Anesthesia Procedure Notes (Signed)
 Procedure Name: Intubation Date/Time: 01/07/2024 8:04 AM  Performed by: Marshall Skeeter, CRNAPre-anesthesia Checklist: Patient identified, Emergency Drugs available, Suction available, Patient being monitored and Timeout performed Patient Re-evaluated:Patient Re-evaluated prior to induction Oxygen Delivery Method: Circle system utilized Preoxygenation: Pre-oxygenation with 100% oxygen Induction Type: IV induction Ventilation: Oral airway inserted - appropriate to patient size and Mask ventilation without difficulty Laryngoscope Size: Mac and 4 Grade View: Grade III Tube type: Oral Tube size: 7.5 mm Number of attempts: 1 Airway Equipment and Method: Stylet Placement Confirmation: ETT inserted through vocal cords under direct vision, positive ETCO2 and breath sounds checked- equal and bilateral Secured at: 24 cm Tube secured with: Tape Dental Injury: Teeth and Oropharynx as per pre-operative assessment  Difficulty Due To: Difficult Airway- due to anterior larynx Comments: Smooth IV induction. Oral airway required for easy masking. DL X ! With MAC 4 by CRNA. Grade 2-3 view noted. Airway noted to be deep. - ETCO2. ETT removed, Pt masked. DL X ! With MAC 4 by DR Otis Blocker. Grade 2-3 view. + ETCO2. BBS=. ETT secured at 24 at the lip

## 2024-01-07 NOTE — Anesthesia Postprocedure Evaluation (Signed)
 Anesthesia Post Note  Patient: Barnard Sharps  Procedure(s) Performed: ERCP, WITH INTERVENTION IF INDICATED EGD (ESOPHAGOGASTRODUODENOSCOPY) REMOVAL, FOREIGN BODY STENT REMOVAL     Patient location during evaluation: PACU Anesthesia Type: General Level of consciousness: awake and alert Pain management: pain level controlled Vital Signs Assessment: post-procedure vital signs reviewed and stable Respiratory status: spontaneous breathing, nonlabored ventilation, respiratory function stable and patient connected to nasal cannula oxygen Cardiovascular status: blood pressure returned to baseline and stable Postop Assessment: no apparent nausea or vomiting Anesthetic complications: no  No notable events documented.  Last Vitals:  Vitals:   01/07/24 0910 01/07/24 0920  BP: (!) 136/54 136/62  Pulse: 80 75  Resp: 16 11  Temp:    SpO2: 96% 95%    Last Pain:  Vitals:   01/07/24 0906  TempSrc: Temporal  PainSc: 0-No pain                 Willian Harrow

## 2024-01-07 NOTE — Telephone Encounter (Signed)
 Recall has been entered  Lab order entered- pt to be called in 2 weeks

## 2024-01-07 NOTE — Op Note (Signed)
 [facility] Patient Name: Jorge Garcia Procedure Date: 01/07/2024 MRN: [id number] Attending MD: Yong Henle , MD, [id number] Date of Birth: Nov 24, 1945 CSN: [id number] Age: 78 Admit Type: Outpatient Procedure:                ERCP Indications:              Bile leak, Biliary stent removal Providers:                Yong Henle, MD, Bradley Caffey, Arlin Benes, Technician Referring MD:              Medicines:                General Anesthesia, Cipro 400 mg IV, Diclofenac  100                            mg rectal Complications:            No immediate complications. Estimated Blood Loss:     Estimated blood loss was minimal. Procedure:                Pre-Anesthesia Assessment:                           - Prior to the procedure, a History and Physical                            was performed, and patient medications and                            allergies were reviewed. The patient's tolerance of                            previous anesthesia was also reviewed. The risks                            and benefits of the procedure and the sedation                            options and risks were discussed with the patient.                            All questions were answered, and informed consent                            was obtained. Prior Anticoagulants: The patient has                            taken no anticoagulant or antiplatelet agents                            except for aspirin. ASA Grade Assessment: III - A  patient with severe systemic disease. After                            reviewing the risks and benefits, the patient was                            deemed in satisfactory condition to undergo the                            procedure.                           After obtaining informed consent, the scope was                            passed under direct vision. Throughout the                             procedure, the patient's blood pressure, pulse, and                            oxygen saturations were monitored continuously. The                            [id number]-[id number] ([id number] Olympus therapeutic endoscope                            was introduced through the mouth, and used to                            inject contrast into and used to locate the major                            papilla. The [employer] D single                            use duodenoscope was introduced through the mouth,                            and used to inject contrast into and used to locate                            the major papilla. The ERCP was technically                            difficult and complex due to presence of food.                            Successful completion of the procedure was aided by                            performing the maneuvers documented (below) in this  report. The patient tolerated the procedure. Scope In: Scope Out: Findings:      A standard esophagogastroduodenoscopy scope was used for the examination       of the upper gastrointestinal tract after finding of significant       foodstuffs. The scope was passed under direct vision through the upper       GI tract. No gross lesions were noted in the entire esophagus. A 3 cm       hiatal hernia was present. A large amount of food (residue) was found in       the entire examined stomach - this impaired ability to visualize the       entirety of the stomach lining even after significant suction via       endoscope. One non-bleeding cratered duodenal ulcer was found in the       duodenal bulb. The lesion was 10 mm in largest dimension. Biopsies       obtained of the gastric lining due to finding of duodenal ulcer to rule       out H. pylori. As a continue to have difficulty visualizing the pylorus       as a result of the foodstuffs in the J-shaped stomach, I placed a  long       450 cm 0.035 Jagwire into the duodenum to ensure visualization upon       duodenoscope placement.      A scout film of the abdomen was obtained. Surgical clips, consistent       with previous cholecystectomy, were seen in the area of the right upper       quadrant of the abdomen. One stent ending in the main bile duct was seen.      The major papilla was on the rim of a diverticulum. A biliary       sphincterotomy had been performed. The sphincterotomy appeared open. One       plastic biliary stent originating in the biliary tree was emerging from       the major papilla. One stent was removed from the biliary tree using a       snare.      A 0.035 inch x 260 cm straight Hydra Jagwire was passed into the biliary       tree. The short-nosed traction sphincterotome was passed over the       guidewire and the bile duct was then deeply cannulated. Contrast was       injected. I personally interpreted the bile duct images. Ductal flow of       contrast was adequate. Image quality was adequate. Contrast extended to       the hepatic ducts. Opacification of the entire biliary tree except for       the gallbladder was successful. The main bile duct was mildly dilated.       The largest diameter was 10 mm. To discover objects, the biliary tree       was swept with a retrieval balloon. Sludge was swept from the duct. An       occlusion cholangiogram was performed that showed no further significant       biliary pathology and no evidence of leak persisting.      A pancreatogram was not performed.      The duodenoscope was withdrawn from the patient. Impression:               - No gross lesions in  the entire esophagus.                           - 3 cm hiatal hernia.                           - A large amount of food (residue) in the stomach.                            Suction via endoscope still made visualization                            difficult.                           - Biopsies  were taken with a cold forceps for                            histology in the cardia, in the gastric body, at                            the incisura and in the gastric antrum.                           - Non-bleeding duodenal ulcer in the bulb.                           - Used a long Jagwire to aid in fluoroscopic                            placement of the duodenoscope to get into ERCP                            position.                           - The major papilla was on the rim of a                            diverticulum.                           - Prior biliary sphincterotomy appeared open.                           - One stent from the biliary tree was seen in the                            major papilla. This was removed.                           - The entire main bile duct was mildly dilated.                           - The biliary tree was swept  and sludge was found.                           - No evidence of persisting biliary leak. Moderate Sedation:      Not Applicable - Patient had care per Anesthesia. Recommendation:           - The patient will be observed post-procedure,                            until all discharge criteria are met.                           - Discharge patient to home.                           - Patient has a contact number available for                            emergencies. The signs and symptoms of potential                            delayed complications were discussed with the                            patient. Return to normal activities tomorrow.                            Written discharge instructions were provided to the                            patient.                           - Low fat diet.                           - Observe patient's clinical course.                           - Check liver enzymes (AST, ALT, alkaline                            phosphatase, bilirubin) in 2 weeks.                           - Continue PPI 40  mg twice daily.                           - Carafate 1 g twice daily for 1 month then stop.                           - Plan for repeat upper endoscopy in 4 months to                            ensure healing of duodenal ulcer. For next upper  endoscopy ensure patient has liquids for dinner as                            his stomach was full of foodstuffs today prior to                            n.p.o. status.                           - The findings and recommendations were discussed                            with the patient.                           - The findings and recommendations were discussed                            with the designated responsible adult. Procedure Code(s):        --- Professional ---                           [phone], Esophagogastroduodenoscopy, flexible,                            transoral; with removal of foreign body(s)                           43239, Esophagogastroduodenoscopy, flexible,                            transoral; with biopsy, single or multiple Diagnosis Code(s):        --- Professional ---                           K44.9, Diaphragmatic hernia without obstruction or                            gangrene                           T18.2XXA, Foreign body in stomach, initial encounter                           K26.9, Duodenal ulcer, unspecified as acute or                            chronic, without hemorrhage or perforation                           Z96.89, Presence of other specified functional                            implants                           K83.8, Other specified diseases of biliary tract  Z46.59, Encounter for fitting and adjustment of                            other gastrointestinal appliance and device CPT copyright 2022 American Medical Association. All rights reserved. The codes documented in this report are preliminary and upon coder review may  be revised to meet current  compliance requirements. Yong Henle, MD 01/07/2024 9:09:08 AM Number of Addenda: 0

## 2024-01-07 NOTE — Anesthesia Preprocedure Evaluation (Addendum)
 Anesthesia Evaluation  Patient identified by MRN, date of birth, ID band Patient awake    Reviewed: Allergy & Precautions, NPO status , Patient's Chart, lab work & pertinent test results  Airway Mallampati: II  TM Distance: >3 FB Neck ROM: Full    Dental  (+) Teeth Intact, Dental Advisory Given   Pulmonary former smoker   breath sounds clear to auscultation       Cardiovascular hypertension, Pt. on medications  Rhythm:Regular Rate:Normal     Neuro/Psych negative neurological ROS  negative psych ROS   GI/Hepatic Neg liver ROS,GERD  Medicated,,  Endo/Other  diabetes, Type 2, Oral Hypoglycemic Agents    Renal/GU negative Renal ROS     Musculoskeletal  (+) Arthritis ,    Abdominal   Peds  Hematology negative hematology ROS (+)   Anesthesia Other Findings   Reproductive/Obstetrics                             Anesthesia Physical Anesthesia Plan  ASA: 2  Anesthesia Plan: General   Post-op Pain Management: Ofirmev  IV (intra-op)*   Induction: Intravenous  PONV Risk Score and Plan: 3 and Ondansetron , Dexamethasone  and Treatment may vary due to age or medical condition  Airway Management Planned: Oral ETT  Additional Equipment: None  Intra-op Plan:   Post-operative Plan: Extubation in OR  Informed Consent: I have reviewed the patients History and Physical, chart, labs and discussed the procedure including the risks, benefits and alternatives for the proposed anesthesia with the patient or authorized representative who has indicated his/her understanding and acceptance.     Dental advisory given  Plan Discussed with: CRNA  Anesthesia Plan Comments:        Anesthesia Quick Evaluation

## 2024-01-07 NOTE — Discharge Instructions (Signed)

## 2024-01-07 NOTE — Transfer of Care (Signed)
 Immediate Anesthesia Transfer of Care Note  Patient: Jorge Garcia  Procedure(s) Performed: ERCP, WITH INTERVENTION IF INDICATED EGD (ESOPHAGOGASTRODUODENOSCOPY) REMOVAL, FOREIGN BODY STENT REMOVAL  Patient Location: PACU and Endoscopy Unit  Anesthesia Type:MAC  Level of Consciousness: awake, alert , oriented, and patient cooperative  Airway & Oxygen Therapy: Patient Spontanous Breathing and Patient connected to face mask oxygen  Post-op Assessment: Report given to RN, Post -op Vital signs reviewed and stable, and Patient moving all extremities  Post vital signs: Reviewed and stable  Last Vitals:  Vitals Value Taken Time  BP 146/63 01/07/24 0905  Temp    Pulse 81 01/07/24 0906  Resp 15 01/07/24 0906  SpO2 99 % 01/07/24 0906  Vitals shown include unfiled device data.  Last Pain:  Vitals:   01/07/24 0654  TempSrc: Temporal  PainSc: 0-No pain         Complications: No notable events documented.

## 2024-01-07 NOTE — Telephone Encounter (Signed)
-----   Message from [facility] sent at 01/07/2024  9:10 AM EDT ----- Regarding: Followup Jorge Garcia, This patient needs a hepatic function panel in 2 weeks for follow-up post biliary stent removal. EGD recall 4 months in [facility] for duodenal ulcer follow-up. Thanks. GM
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
## 2024-01-08 ENCOUNTER — Encounter (HOSPITAL_COMMUNITY): Payer: Self-pay | Admitting: Gastroenterology

## 2024-01-08 LAB — SURGICAL PATHOLOGY

## 2024-01-11 ENCOUNTER — Ambulatory Visit (INDEPENDENT_AMBULATORY_CARE_PROVIDER_SITE_OTHER): Admitting: *Deleted

## 2024-01-11 DIAGNOSIS — R7989 Other specified abnormal findings of blood chemistry: Secondary | ICD-10-CM

## 2024-01-11 DIAGNOSIS — E538 Deficiency of other specified B group vitamins: Secondary | ICD-10-CM

## 2024-01-11 NOTE — Progress Notes (Signed)
 Patient is in office today for a nurse visit for B12 Injection. Patient Injection was given in the  Left deltoid. Patient tolerated injection well.

## 2024-01-14 ENCOUNTER — Ambulatory Visit: Payer: Self-pay | Admitting: Gastroenterology

## 2024-01-21 ENCOUNTER — Telehealth: Payer: Self-pay

## 2024-01-21 NOTE — Telephone Encounter (Signed)
 The patient has been notified of this information and all questions answered.

## 2024-01-21 NOTE — Telephone Encounter (Signed)
-----   Message from Nurse Calvyn Kurtzman P sent at 01/07/2024 10:19 AM EDT ----- Pt needs labs

## 2024-01-22 ENCOUNTER — Ambulatory Visit: Payer: Self-pay | Admitting: Gastroenterology

## 2024-01-22 ENCOUNTER — Other Ambulatory Visit (INDEPENDENT_AMBULATORY_CARE_PROVIDER_SITE_OTHER)

## 2024-01-22 DIAGNOSIS — K831 Obstruction of bile duct: Secondary | ICD-10-CM

## 2024-01-22 DIAGNOSIS — K839 Disease of biliary tract, unspecified: Secondary | ICD-10-CM | POA: Diagnosis not present

## 2024-01-22 LAB — HEPATIC FUNCTION PANEL
ALT: 23 U/L (ref 0–53)
AST: 17 U/L (ref 0–37)
Albumin: 4.4 g/dL (ref 3.5–5.2)
Alkaline Phosphatase: 79 U/L (ref 39–117)
Bilirubin, Direct: 0.3 mg/dL (ref 0.0–0.3)
Total Bilirubin: 1.6 mg/dL — ABNORMAL HIGH (ref 0.2–1.2)
Total Protein: 7.1 g/dL (ref 6.0–8.3)

## 2024-02-10 ENCOUNTER — Ambulatory Visit (INDEPENDENT_AMBULATORY_CARE_PROVIDER_SITE_OTHER): Admitting: *Deleted

## 2024-02-10 DIAGNOSIS — E538 Deficiency of other specified B group vitamins: Secondary | ICD-10-CM

## 2024-02-10 DIAGNOSIS — R7989 Other specified abnormal findings of blood chemistry: Secondary | ICD-10-CM | POA: Diagnosis not present

## 2024-02-10 NOTE — Progress Notes (Signed)
B12 injection given right deltoid intramuscular. Patient tolerated well.

## 2024-02-24 ENCOUNTER — Emergency Department (HOSPITAL_COMMUNITY)

## 2024-02-24 ENCOUNTER — Inpatient Hospital Stay (HOSPITAL_COMMUNITY)
Admission: EM | Admit: 2024-02-24 | Discharge: 2024-02-26 | DRG: 872 | Disposition: A | Discharge status: Home / Self Care | Source: Ambulatory Visit | Attending: Internal Medicine | Admitting: Internal Medicine

## 2024-02-24 ENCOUNTER — Ambulatory Visit: Admitting: Family Medicine

## 2024-02-24 ENCOUNTER — Encounter: Payer: Self-pay | Admitting: Family Medicine

## 2024-02-24 ENCOUNTER — Other Ambulatory Visit: Payer: Self-pay

## 2024-02-24 ENCOUNTER — Encounter (HOSPITAL_COMMUNITY): Payer: Self-pay

## 2024-02-24 ENCOUNTER — Telehealth: Payer: Self-pay

## 2024-02-24 VITALS — BP 114/70 | HR 75 | Temp 98.4°F | Wt 205.6 lb

## 2024-02-24 DIAGNOSIS — Z79899 Other long term (current) drug therapy: Secondary | ICD-10-CM

## 2024-02-24 DIAGNOSIS — Z794 Long term (current) use of insulin: Secondary | ICD-10-CM | POA: Diagnosis not present

## 2024-02-24 DIAGNOSIS — Z833 Family history of diabetes mellitus: Secondary | ICD-10-CM

## 2024-02-24 DIAGNOSIS — R531 Weakness: Secondary | ICD-10-CM | POA: Diagnosis present

## 2024-02-24 DIAGNOSIS — R509 Fever, unspecified: Secondary | ICD-10-CM | POA: Diagnosis not present

## 2024-02-24 DIAGNOSIS — E876 Hypokalemia: Secondary | ICD-10-CM | POA: Diagnosis present

## 2024-02-24 DIAGNOSIS — N390 Urinary tract infection, site not specified: Secondary | ICD-10-CM | POA: Diagnosis not present

## 2024-02-24 DIAGNOSIS — E860 Dehydration: Secondary | ICD-10-CM | POA: Diagnosis present

## 2024-02-24 DIAGNOSIS — Z7984 Long term (current) use of oral hypoglycemic drugs: Secondary | ICD-10-CM

## 2024-02-24 DIAGNOSIS — K219 Gastro-esophageal reflux disease without esophagitis: Secondary | ICD-10-CM | POA: Diagnosis present

## 2024-02-24 DIAGNOSIS — R739 Hyperglycemia, unspecified: Secondary | ICD-10-CM | POA: Diagnosis not present

## 2024-02-24 DIAGNOSIS — Z87891 Personal history of nicotine dependence: Secondary | ICD-10-CM

## 2024-02-24 DIAGNOSIS — Z860101 Personal history of adenomatous and serrated colon polyps: Secondary | ICD-10-CM | POA: Diagnosis not present

## 2024-02-24 DIAGNOSIS — R000 Tachycardia, unspecified: Secondary | ICD-10-CM | POA: Diagnosis present

## 2024-02-24 DIAGNOSIS — E11649 Type 2 diabetes mellitus with hypoglycemia without coma: Secondary | ICD-10-CM | POA: Diagnosis not present

## 2024-02-24 DIAGNOSIS — N12 Tubulo-interstitial nephritis, not specified as acute or chronic: Secondary | ICD-10-CM | POA: Diagnosis present

## 2024-02-24 DIAGNOSIS — I10 Essential (primary) hypertension: Secondary | ICD-10-CM | POA: Diagnosis not present

## 2024-02-24 DIAGNOSIS — K5710 Diverticulosis of small intestine without perforation or abscess without bleeding: Secondary | ICD-10-CM | POA: Diagnosis not present

## 2024-02-24 DIAGNOSIS — E1165 Type 2 diabetes mellitus with hyperglycemia: Secondary | ICD-10-CM | POA: Diagnosis present

## 2024-02-24 DIAGNOSIS — Z8249 Family history of ischemic heart disease and other diseases of the circulatory system: Secondary | ICD-10-CM | POA: Diagnosis not present

## 2024-02-24 DIAGNOSIS — E871 Hypo-osmolality and hyponatremia: Secondary | ICD-10-CM | POA: Diagnosis not present

## 2024-02-24 DIAGNOSIS — R319 Hematuria, unspecified: Secondary | ICD-10-CM | POA: Diagnosis present

## 2024-02-24 DIAGNOSIS — E782 Mixed hyperlipidemia: Secondary | ICD-10-CM | POA: Diagnosis present

## 2024-02-24 DIAGNOSIS — Z0389 Encounter for observation for other suspected diseases and conditions ruled out: Secondary | ICD-10-CM | POA: Diagnosis not present

## 2024-02-24 DIAGNOSIS — Z1152 Encounter for screening for COVID-19: Secondary | ICD-10-CM | POA: Diagnosis not present

## 2024-02-24 DIAGNOSIS — R5383 Other fatigue: Secondary | ICD-10-CM | POA: Diagnosis not present

## 2024-02-24 DIAGNOSIS — Z823 Family history of stroke: Secondary | ICD-10-CM | POA: Diagnosis not present

## 2024-02-24 DIAGNOSIS — N400 Enlarged prostate without lower urinary tract symptoms: Secondary | ICD-10-CM | POA: Diagnosis not present

## 2024-02-24 DIAGNOSIS — A419 Sepsis, unspecified organism: Secondary | ICD-10-CM | POA: Diagnosis not present

## 2024-02-24 DIAGNOSIS — K4020 Bilateral inguinal hernia, without obstruction or gangrene, not specified as recurrent: Secondary | ICD-10-CM | POA: Diagnosis not present

## 2024-02-24 LAB — COMPREHENSIVE METABOLIC PANEL WITH GFR
ALT: 30 U/L (ref 0–44)
AST: 35 U/L (ref 15–41)
Albumin: 3.6 g/dL (ref 3.5–5.0)
Alkaline Phosphatase: 63 U/L (ref 38–126)
Anion gap: 15 (ref 5–15)
BUN: 26 mg/dL — ABNORMAL HIGH (ref 8–23)
CO2: 20 mmol/L — ABNORMAL LOW (ref 22–32)
Calcium: 9.1 mg/dL (ref 8.9–10.3)
Chloride: 96 mmol/L — ABNORMAL LOW (ref 98–111)
Creatinine, Ser: 1.09 mg/dL (ref 0.61–1.24)
GFR, Estimated: >60 mL/min (ref >60)
Glucose, Bld: 229 mg/dL — ABNORMAL HIGH (ref 70–99)
Potassium: 3.3 mmol/L — ABNORMAL LOW (ref 3.5–5.1)
Sodium: 131 mmol/L — ABNORMAL LOW (ref 135–145)
Total Bilirubin: 4.4 mg/dL — ABNORMAL HIGH (ref 0.0–1.2)
Total Protein: 7.3 g/dL (ref 6.5–8.1)

## 2024-02-24 LAB — CBC WITH DIFFERENTIAL/PLATELET
Abs Immature Granulocytes: 0.08 K/uL — ABNORMAL HIGH (ref 0.00–0.07)
Basophils Absolute: 0 K/uL (ref 0.0–0.1)
Basophils Relative: 0 %
Eosinophils Absolute: 0 K/uL (ref 0.0–0.5)
Eosinophils Relative: 0 %
HCT: 36.3 % — ABNORMAL LOW (ref 39.0–52.0)
Hemoglobin: 12.1 g/dL — ABNORMAL LOW (ref 13.0–17.0)
Immature Granulocytes: 1 %
Lymphocytes Relative: 5 %
Lymphs Abs: 0.7 K/uL (ref 0.7–4.0)
MCH: 27 pg (ref 26.0–34.0)
MCHC: 33.3 g/dL (ref 30.0–36.0)
MCV: 81 fL (ref 80.0–100.0)
Monocytes Absolute: 1.3 K/uL — ABNORMAL HIGH (ref 0.1–1.0)
Monocytes Relative: 9 %
Neutro Abs: 11.7 K/uL — ABNORMAL HIGH (ref 1.7–7.7)
Neutrophils Relative %: 85 %
Platelets: 191 K/uL (ref 150–400)
RBC: 4.48 MIL/uL (ref 4.22–5.81)
RDW: 13.5 % (ref 11.5–15.5)
WBC: 13.8 K/uL — ABNORMAL HIGH (ref 4.0–10.5)
nRBC: 0 % (ref 0.0–0.2)

## 2024-02-24 LAB — URINALYSIS, W/ REFLEX TO CULTURE (INFECTION SUSPECTED)
Bacteria, UA: NONE SEEN
Bilirubin Urine: NEGATIVE
Glucose, UA: >=500 mg/dL — AB
Ketones, ur: 5 mg/dL — AB
Leukocytes,Ua: NEGATIVE
Nitrite: NEGATIVE
Protein, ur: 100 mg/dL — AB
Specific Gravity, Urine: 1.03 (ref 1.005–1.030)
pH: 5 (ref 5.0–8.0)

## 2024-02-24 LAB — URINALYSIS, COMPLETE
Bilirubin, UA: NEGATIVE
Leukocytes,UA: NEGATIVE
Nitrite, UA: NEGATIVE
Specific Gravity, UA: 1.025 (ref 1.005–1.030)
Urobilinogen, Ur: 0.2 mg/dL (ref 0.2–1.0)
pH, UA: 5.5 (ref 5.0–7.5)

## 2024-02-24 LAB — RESP PANEL BY RT-PCR (RSV, FLU A&B, COVID)  RVPGX2
Influenza A by PCR: NEGATIVE
Influenza B by PCR: NEGATIVE
Resp Syncytial Virus by PCR: NEGATIVE
SARS Coronavirus 2 by RT PCR: NEGATIVE

## 2024-02-24 LAB — GLUCOSE, CAPILLARY: Glucose-Capillary: 229 mg/dL — ABNORMAL HIGH (ref 70–99)

## 2024-02-24 LAB — MICROSCOPIC EXAMINATION
Renal Epithel, UA: NONE SEEN /HPF
Yeast, UA: NONE SEEN

## 2024-02-24 LAB — PROTIME-INR
INR: 1.2 (ref 0.8–1.2)
Prothrombin Time: 15.8 s — ABNORMAL HIGH (ref 11.4–15.2)

## 2024-02-24 LAB — CBG MONITORING, ED: Glucose-Capillary: 219 mg/dL — ABNORMAL HIGH (ref 70–99)

## 2024-02-24 LAB — LACTIC ACID, PLASMA
Lactic Acid, Venous: 1.9 mmol/L (ref 0.5–1.9)
Lactic Acid, Venous: 1.6 mmol/L (ref 0.5–1.9)

## 2024-02-24 LAB — MAGNESIUM: Magnesium: 1.2 mg/dL — ABNORMAL LOW (ref 1.7–2.4)

## 2024-02-24 MED ORDER — ONDANSETRON HCL 4 MG/2ML IJ SOLN
4.0000 mg | Freq: Four times a day (QID) | INTRAMUSCULAR | Status: DC | PRN
Start: 2024-02-24 — End: 2024-02-26

## 2024-02-24 MED ORDER — MAGNESIUM SULFATE 2 GM/50ML IV SOLN
2.0000 g | Freq: Once | INTRAVENOUS | Status: AC
  Administered 2024-02-24: 2 g via INTRAVENOUS
  Filled 2024-02-24: qty 50

## 2024-02-24 MED ORDER — ONDANSETRON HCL 4 MG PO TABS: 4.0000 mg | ORAL_TABLET | Freq: Four times a day (QID) | ORAL | Status: DC | PRN

## 2024-02-24 MED ORDER — LACTATED RINGERS IV BOLUS (SEPSIS)
1000.0000 mL | Freq: Once | INTRAVENOUS | Status: AC
  Administered 2024-02-24: 1000 mL via INTRAVENOUS

## 2024-02-24 MED ORDER — CIPROFLOXACIN HCL 500 MG PO TABS: 500.0000 mg | ORAL_TABLET | Freq: Two times a day (BID) | ORAL | 0 refills | Status: DC

## 2024-02-24 MED ORDER — LACTATED RINGERS IV BOLUS (SEPSIS): 1000.0000 mL | Freq: Once | INTRAVENOUS | Status: DC

## 2024-02-24 MED ORDER — ASPIRIN 81 MG PO TBEC
81.0000 mg | DELAYED_RELEASE_TABLET | Freq: Every day | ORAL | Status: DC
Start: 2024-02-25 — End: 2024-02-26
  Administered 2024-02-25 – 2024-02-26 (×2): 81 mg via ORAL
  Filled 2024-02-24 (×2): qty 1

## 2024-02-24 MED ORDER — SODIUM CHLORIDE 0.9 % IV SOLN
1.0000 g | Freq: Once | INTRAVENOUS | Status: AC
  Administered 2024-02-24: 1 g via INTRAVENOUS
  Filled 2024-02-24: qty 10

## 2024-02-24 MED ORDER — CEFTRIAXONE SODIUM 1 G IJ SOLR
1.0000 g | Freq: Once | INTRAMUSCULAR | Status: AC
  Administered 2024-02-24: 1 g via INTRAMUSCULAR

## 2024-02-24 MED ORDER — OMEGA-3-ACID ETHYL ESTERS 1 G PO CAPS
1.0000 g | ORAL_CAPSULE | Freq: Two times a day (BID) | ORAL | Status: DC
  Administered 2024-02-24 – 2024-02-26 (×4): 1 g via ORAL
  Filled 2024-02-24 (×4): qty 1

## 2024-02-24 MED ORDER — PANTOPRAZOLE SODIUM 40 MG PO TBEC
40.0000 mg | DELAYED_RELEASE_TABLET | Freq: Two times a day (BID) | ORAL | Status: DC
  Administered 2024-02-24 – 2024-02-26 (×4): 40 mg via ORAL
  Filled 2024-02-24 (×4): qty 1

## 2024-02-24 MED ORDER — INSULIN ASPART 100 UNIT/ML IJ SOLN
0.0000 [IU] | Freq: Every day | INTRAMUSCULAR | Status: DC
  Administered 2024-02-24 – 2024-02-25 (×2): 2 [IU] via SUBCUTANEOUS

## 2024-02-24 MED ORDER — OMEGA-3 FATTY ACIDS 1000 MG PO CAPS: 1.0000 g | ORAL_CAPSULE | Freq: Two times a day (BID) | ORAL | Status: DC

## 2024-02-24 MED ORDER — FAMOTIDINE 20 MG PO TABS
20.0000 mg | ORAL_TABLET | Freq: Every day | ORAL | Status: DC
  Administered 2024-02-24 – 2024-02-25 (×2): 20 mg via ORAL
  Filled 2024-02-24 (×2): qty 1

## 2024-02-24 MED ORDER — ROSUVASTATIN CALCIUM 10 MG PO TABS
10.0000 mg | ORAL_TABLET | Freq: Every day | ORAL | Status: DC
  Administered 2024-02-24 – 2024-02-25 (×2): 10 mg via ORAL
  Filled 2024-02-24 (×2): qty 1

## 2024-02-24 MED ORDER — LACTATED RINGERS IV SOLN: INTRAVENOUS | Status: AC

## 2024-02-24 MED ORDER — ACETAMINOPHEN 650 MG RE SUPP: 650.0000 mg | Freq: Four times a day (QID) | RECTAL | Status: DC | PRN

## 2024-02-24 MED ORDER — AMLODIPINE BESYLATE 5 MG PO TABS
10.0000 mg | ORAL_TABLET | Freq: Every day | ORAL | Status: DC
  Administered 2024-02-25 – 2024-02-26 (×2): 10 mg via ORAL
  Filled 2024-02-24 (×2): qty 2

## 2024-02-24 MED ORDER — INSULIN ASPART 100 UNIT/ML IJ SOLN
0.0000 [IU] | Freq: Three times a day (TID) | INTRAMUSCULAR | Status: DC
  Administered 2024-02-24: 2 [IU] via SUBCUTANEOUS
  Administered 2024-02-25: 3 [IU] via SUBCUTANEOUS
  Administered 2024-02-25: 5 [IU] via SUBCUTANEOUS
  Administered 2024-02-25 – 2024-02-26 (×2): 2 [IU] via SUBCUTANEOUS
  Filled 2024-02-24: qty 1

## 2024-02-24 MED ORDER — SODIUM CHLORIDE 0.9 % IV SOLN
2.0000 g | INTRAVENOUS | Status: DC
  Administered 2024-02-25: 2 g via INTRAVENOUS
  Filled 2024-02-24 (×2): qty 20

## 2024-02-24 MED ORDER — IOHEXOL 300 MG/ML  SOLN
100.0000 mL | Freq: Once | INTRAMUSCULAR | Status: AC | PRN
  Administered 2024-02-24: 100 mL via INTRAVENOUS

## 2024-02-24 MED ORDER — ACETAMINOPHEN 325 MG PO TABS: 650.0000 mg | ORAL_TABLET | Freq: Once | ORAL | Status: DC

## 2024-02-24 MED ORDER — ACETAMINOPHEN 325 MG PO TABS
650.0000 mg | ORAL_TABLET | Freq: Four times a day (QID) | ORAL | Status: DC | PRN
  Administered 2024-02-24: 650 mg via ORAL
  Filled 2024-02-24: qty 2

## 2024-02-24 MED ORDER — POTASSIUM CHLORIDE CRYS ER 20 MEQ PO TBCR
40.0000 meq | EXTENDED_RELEASE_TABLET | Freq: Once | ORAL | Status: AC
  Administered 2024-02-24: 40 meq via ORAL
  Filled 2024-02-24: qty 2

## 2024-02-24 NOTE — Progress Notes (Signed)
 Subjective:  Patient ID: Jorge Garcia, male    DOB: August 07, 1946  Age: 78 y.o. MRN: [id number]  CC: Fever (Patient fallen 2 times in the last 2 days. Patient has been running a fever. In march patients gallbladder ruptured and has had trouble ever since. Patient has been confused and unable to complete sentences. Fever has been 103 and blood sugar has been high. )   HPI Daniela Hernan presents for slipped and fell when standing up yesterday. Doesn't have the energy to walk across the flor. Glucose high. Temp to 103 last night. 101 at 4 AM. Difficulty since Gall bladder surgery 4 mos ago. Clemens again at 4 AM today. Got out ofbed to go to the bathroom. Yesterday couldn't talk, hecouldn't make sentences. Gave one word answers  to questions.   Significant other reports he has been confused, jerked all night long last night. Having urinary frequency. No dysuria.      02/24/2024    9:57 AM 12/10/2023    8:28 AM 11/10/2023    3:24 PM  Depression screen PHQ 2/9  Decreased Interest 3 0 0  Down, Depressed, Hopeless 2 0 0  PHQ - 2 Score 5 0 0  Altered sleeping 3    Tired, decreased energy 3    Change in appetite 1    Feeling bad or failure about yourself  0    Trouble concentrating 3    Moving slowly or fidgety/restless 3    Suicidal thoughts 0    PHQ-9 Score 18    Difficult doing work/chores Not difficult at all      History Dayvion has a past medical history of Arthritis, Cataract, Diabetes mellitus without complication (HCC), GERD (gastroesophageal reflux disease), Hyperlipidemia, Hypertension, Internal hemorrhoids, Tubulovillous adenoma of colon (2009), and Vitamin D  deficiency.   He has a past surgical history that includes Hemorrhoid surgery (1978); Colonoscopy (12/01/2013); Upper gastrointestinal endoscopy; Polypectomy (2020); Colonoscopy (2020); Cataract extraction, bilateral (Bilateral); Wisdom tooth extraction; Colonoscopy (04/22/2022); Cholecystectomy (N/A, 10/29/2023); ERCP (N/A,  10/28/2023); Esophagogastroduodenoscopy (N/A, 10/28/2023); Biopsy of skin subcutaneous tissue and/or mucous membrane (10/28/2023); pancreatic stent placement (10/28/2023); ERCP (N/A, 11/02/2023); biliary stent placement (11/02/2023); Stone extraction with basket (11/02/2023); ERCP (N/A, 01/07/2024); Esophagogastroduodenoscopy (N/A, 01/07/2024); Foreign Body Removal (01/07/2024); and Stent removal (01/07/2024).   His family history includes Diabetes in his father and mother; Heart attack (age of onset: 39) in his father; Hypertension in his father and mother; Stroke (age of onset: 31) in his mother.He reports that he quit smoking about 18 years ago. His smoking use included cigarettes. He started smoking about 58 years ago. He has a 80 pack-year smoking history. He has never used smokeless tobacco. He reports that he does not drink alcohol and does not use drugs.    ROS Review of Systems  Constitutional: Negative.   HENT: Negative.    Eyes:  Negative for visual disturbance.  Respiratory:  Negative for cough and shortness of breath.   Cardiovascular:  Negative for chest pain and leg swelling.  Gastrointestinal:  Negative for abdominal pain, diarrhea, nausea and vomiting.  Genitourinary:  Negative for difficulty urinating. Frequency: 3-4 nocturia per night. Musculoskeletal:  Negative for arthralgias and myalgias.  Skin:  Negative for rash.  Neurological:  Negative for headaches.  Psychiatric/Behavioral:  Negative for sleep disturbance.     Objective:  BP 114/70   Pulse 75   Temp 98.4 F (36.9 C)   Wt 205 lb 9.6 oz (93.3 kg)   SpO2 93%   BMI 27.13 kg/m  BP Readings from Last 3 Encounters:  02/26/24 (!) 153/74  02/24/24 114/70  01/07/24 136/62    Wt Readings from Last 3 Encounters:  02/24/24 205 lb (93 kg)  02/24/24 205 lb 9.6 oz (93.3 kg)  01/07/24 208 lb (94.3 kg)     Physical Exam Constitutional:      General: He is not in acute distress.    Appearance: He is well-developed.   HENT:     Head: Normocephalic and atraumatic.     Right Ear: External ear normal.     Left Ear: External ear normal.     Nose: Nose normal.  Eyes:     Conjunctiva/sclera: Conjunctivae normal.     Pupils: Pupils are equal, round, and reactive to light.  Cardiovascular:     Rate and Rhythm: Normal rate and regular rhythm.     Heart sounds: Normal heart sounds. No murmur heard. Pulmonary:     Effort: Pulmonary effort is normal. No respiratory distress.     Breath sounds: Normal breath sounds. No wheezing or rales.  Abdominal:     Palpations: Abdomen is soft.     Tenderness: There is no abdominal tenderness.  Musculoskeletal:        General: Normal range of motion.     Cervical back: Normal range of motion and neck supple.  Skin:    General: Skin is warm and dry.  Neurological:     Mental Status: He is alert and oriented to person, place, and time.     Deep Tendon Reflexes: Reflexes are normal and symmetric.  Psychiatric:        Behavior: Behavior normal.        Thought Content: Thought content normal.        Judgment: Judgment normal.    Results for orders placed or performed in visit on 02/24/24  Microscopic Examination   Collection Time: 02/24/24  8:17 AM   Urine  Result Value Ref Range   WBC, UA 0-5 0 - 5 /hpf   RBC, Urine 0-2 0 - 2 /hpf   Epithelial Cells (non renal) 0-10 0 - 10 /hpf   Renal Epithel, UA None seen None seen /hpf   Bacteria, UA Moderate (A) None seen/Few   Yeast, UA None seen None seen  Urinalysis, Complete   Collection Time: 02/24/24  8:17 AM  Result Value Ref Range   Specific Gravity, UA 1.025 1.005 - 1.030   pH, UA 5.5 5.0 - 7.5   Color, UA Yellow Yellow   Appearance Ur Clear Clear   Leukocytes,UA Negative Negative   Protein,UA 3+ (A) Negative/Trace   Glucose, UA 3+ (A) Negative   Ketones, UA Trace (A) Negative   RBC, UA 3+ (A) Negative   Bilirubin, UA Negative Negative   Urobilinogen, Ur 0.2 0.2 - 1.0 mg/dL   Nitrite, UA Negative Negative    Microscopic Examination See below:   CBC with Differential/Platelet   Collection Time: 02/24/24  9:57 AM  Result Value Ref Range   WBC 19.2 (H) 3.4 - 10.8 x10E3/uL   RBC 4.82 4.14 - 5.80 x10E6/uL   Hemoglobin 12.8 (L) 13.0 - 17.7 g/dL   Hematocrit 58.8 62.4 - 51.0 %   MCV 85 79 - 97 fL   MCH 26.6 26.6 - 33.0 pg   MCHC 31.1 (L) 31.5 - 35.7 g/dL   RDW 85.9 88.3 - 84.5 %   Platelets 247 150 - 450 x10E3/uL   Neutrophils 78 Not Estab. %   Lymphs 13 Not  Estab. %   Monocytes 9 Not Estab. %   Eos 0 Not Estab. %   Basos 0 Not Estab. %   Neutrophils Absolute 15.0 (H) 1.4 - 7.0 x10E3/uL   Lymphocytes Absolute 2.4 0.7 - 3.1 x10E3/uL   Monocytes Absolute 1.6 (H) 0.1 - 0.9 x10E3/uL   EOS (ABSOLUTE) 0.0 0.0 - 0.4 x10E3/uL   Basophils Absolute 0.0 0.0 - 0.2 x10E3/uL   Immature Granulocytes 0 Not Estab. %   Immature Grans (Abs) 0.0 0.0 - 0.1 x10E3/uL  CMP14+EGFR   Collection Time: 02/24/24  9:57 AM  Result Value Ref Range   Glucose 212 (H) 70 - 99 mg/dL   BUN 24 8 - 27 mg/dL   Creatinine, Ser 8.72 0.76 - 1.27 mg/dL   eGFR 59 (L) >40 fO/fpw/8.26   BUN/Creatinine Ratio 19 10 - 24   Sodium 138 134 - 144 mmol/L   Potassium 4.6 3.5 - 5.2 mmol/L   Chloride 97 96 - 106 mmol/L   CO2 18 (L) 20 - 29 mmol/L   Calcium  9.8 8.6 - 10.2 mg/dL   Total Protein 7.3 6.0 - 8.5 g/dL   Albumin 4.4 3.8 - 4.8 g/dL   Globulin, Total 2.9 1.5 - 4.5 g/dL   Bilirubin Total 4.0 (H) 0.0 - 1.2 mg/dL   Alkaline Phosphatase 83 44 - 121 IU/L   AST 27 0 - 40 IU/L   ALT 26 0 - 44 IU/L  Urine Culture   Collection Time: 02/24/24 10:49 AM   Specimen: Urine   UR  Result Value Ref Range   Urine Culture, Routine Preliminary report (A)    Organism ID, Bacteria Comment (A)      Assessment & Plan:  Fever, unspecified fever cause -     Urinalysis, Complete -     Urine Culture -     CBC with Differential/Platelet -     CMP14+EGFR -     cefTRIAXone  Sodium -     Microscopic Examination  Pyelonephritis -      cefTRIAXone  Sodium   Initial urinalysis showed evidence for infection.  This is consistent with his fever.  Follow-up with his CBC the next day showed elevation of the white blood count with marked left shift.  Treatment was started empirically with antibiotics.  He is to rest at home.  Follow-up if the temperature does not decrease and the confusion and instability do not improve with treatment.  Reevaluate for any change for the worse.  Follow-up: Return if symptoms worsen or fail to improve.  Of note is that he presented to the emergency room later in the day and was admitted to inpatient service  Butler Der, M.D.

## 2024-02-24 NOTE — Telephone Encounter (Signed)
 Noted

## 2024-02-24 NOTE — Sepsis Progress Note (Signed)
 eLink is following this Code Sepsis.

## 2024-02-24 NOTE — Telephone Encounter (Signed)
 Copied from CRM [id number]. Topic: General - Other >> Feb 24, 2024  4:00 PM Carla L wrote: Reason for CRM: Arlyne calling to advise provider and nurse that patient is at [facility] and is going to be admitted.

## 2024-02-24 NOTE — ED Provider Notes (Signed)
 [place] EMERGENCY DEPARTMENT AT [facility] Provider Note   CSN: [id number] Arrival date & time: 02/24/24  1142     Patient presents with: possible sepsis   Jorge Garcia is a 78 y.o. male.   Patient is a 78 year old male who presents emergency department with his caretaker secondary to fevers, body aches, urinary frequency, cough and congestion and confusion.  It is noted that symptoms have been ongoing for approximate the past 3 days.  He was initially seen at his primary care doctor's office and sent to the emergency department for further evaluation.  He notes that he has had some nausea and vomiting.  He denies any associated diarrhea.  He denies any active abdominal pain at this time.  He denies any dizziness, lightheadedness or syncope.  He denies any chest pain or shortness of breath.        Prior to Admission medications   Medication Sig Start Date End Date Taking? Authorizing Provider  amLODipine  (NORVASC ) 10 MG tablet Take 1 tablet (10 mg total) by mouth daily. 12/10/23  Yes Smither, Mary-Margaret, FNP  ciprofloxacin  (CIPRO ) 500 MG tablet Take 1 tablet (500 mg total) by mouth 2 (two) times daily. For prostate. Take all of these. 02/24/24  Yes Zollie Lowers, MD  fish oil-omega-3 fatty acids  1000 MG capsule Take 1 g by mouth 2 (two) times daily.   Yes [provider]  hydrochlorothiazide  (HYDRODIURIL ) 25 MG tablet Take 1 tablet (25 mg total) by mouth daily. 12/10/23  Yes Lunger, Mary-Margaret, FNP  metFORMIN  (GLUCOPHAGE ) 1000 MG tablet Take 1 tablet (1,000 mg total) by mouth 2 (two) times daily with a meal. (NEEDS TO BE SEEN BEFORE NEXT REFILL) 12/10/23  Yes Lunger, Mary-Margaret, FNP  pantoprazole  (PROTONIX ) 40 MG tablet Take 1 tablet (40 mg total) by mouth 2 (two) times daily before a meal. 01/07/24  Yes Mansouraty, Aloha Raddle., MD  rosuvastatin  (CRESTOR ) 10 MG tablet Take 1 tablet (10 mg total) by mouth at bedtime. 12/10/23  Yes Stenseth, Mary-Margaret, FNP   aspirin  EC 81 MG tablet Take 81 mg by mouth at bedtime.    [provider]    Allergies: Patient has no known allergies.    Review of Systems  Constitutional:  Positive for fatigue and fever.  Respiratory:  Positive for cough.   Genitourinary:  Positive for frequency.  All other systems reviewed and are negative.   Updated Vital Signs BP 138/60   Pulse 70   Temp 98.3 F (36.8 C) (Oral)   Resp 20   Ht 6' 1 (1.854 m)   Wt 93.3 kg   SpO2 97%   BMI 27.13 kg/m   Physical Exam Vitals and nursing note reviewed.  Constitutional:      Appearance: Normal appearance.  HENT:     Head: Normocephalic and atraumatic.     Nose: Nose normal.     Mouth/Throat:     Mouth: Mucous membranes are moist.  Eyes:     Extraocular Movements: Extraocular movements intact.     Conjunctiva/sclera: Conjunctivae normal.     Pupils: Pupils are equal, round, and reactive to light.  Cardiovascular:     Rate and Rhythm: Normal rate and regular rhythm.     Pulses: Normal pulses.     Heart sounds: Normal heart sounds. No murmur heard. Pulmonary:     Effort: Pulmonary effort is normal. No respiratory distress.     Breath sounds: Normal breath sounds. No stridor. No wheezing, rhonchi or rales.  Abdominal:  General: Abdomen is flat. Bowel sounds are normal. There is no distension.     Palpations: Abdomen is soft.     Tenderness: There is no abdominal tenderness. There is no guarding.  Musculoskeletal:        General: Normal range of motion.     Cervical back: Normal range of motion and neck supple. No rigidity or tenderness.     Right lower leg: No edema.     Left lower leg: No edema.  Skin:    General: Skin is warm and dry.     Findings: No bruising or rash.  Neurological:     General: No focal deficit present.     Mental Status: He is alert and oriented to person, place, and time. Mental status is at baseline.     Cranial Nerves: No cranial nerve deficit.     Sensory: No sensory  deficit.     Motor: No weakness.     Coordination: Coordination normal.     Gait: Gait normal.  Psychiatric:        Mood and Affect: Mood normal.        Behavior: Behavior normal.        Thought Content: Thought content normal.        Judgment: Judgment normal.     (all labs ordered are listed, but only abnormal results are displayed) Labs Reviewed  COMPREHENSIVE METABOLIC PANEL WITH GFR - Abnormal; Notable for the following components:      Result Value   Sodium 131 (*)    Potassium 3.3 (*)    Chloride 96 (*)    CO2 20 (*)    Glucose, Bld 229 (*)    BUN 26 (*)    Total Bilirubin 4.4 (*)    All other components within normal limits  CBC WITH DIFFERENTIAL/PLATELET - Abnormal; Notable for the following components:   WBC 13.8 (*)    Hemoglobin 12.1 (*)    HCT 36.3 (*)    Neutro Abs 11.7 (*)    Monocytes Absolute 1.3 (*)    Abs Immature Granulocytes 0.08 (*)    All other components within normal limits  URINALYSIS, W/ REFLEX TO CULTURE (INFECTION SUSPECTED) - Abnormal; Notable for the following components:   Glucose, UA >=500 (*)    Hgb urine dipstick LARGE (*)    Ketones, ur 5 (*)    Protein, ur 100 (*)    All other components within normal limits  PROTIME-INR - Abnormal; Notable for the following components:   Prothrombin Time 15.8 (*)    All other components within normal limits  MAGNESIUM  - Abnormal; Notable for the following components:   Magnesium  1.2 (*)    All other components within normal limits  RESP PANEL BY RT-PCR (RSV, FLU A&B, COVID)  RVPGX2  CULTURE, BLOOD (ROUTINE X 2)  CULTURE, BLOOD (ROUTINE X 2)  URINE CULTURE  LACTIC ACID, PLASMA  LACTIC ACID, PLASMA    EKG: EKG Interpretation Date/Time:  Wednesday February 24 2024 12:36:10 EDT Ventricular Rate:  99 PR Interval:  153 QRS Duration:  93 QT Interval:  388 QTC Calculation: 498 R Axis:   69  Text Interpretation: Sinus rhythm Borderline T abnormalities, diffuse leads Borderline prolonged QT  interval Partial missing lead(s): V2 Confirmed by Francesca Fallow ([id number]) on 02/24/2024 3:49:45 PM  Radiology: CT ABDOMEN PELVIS W CONTRAST Result Date: 02/24/2024 CLINICAL DATA:  Abdominal pain, elevated liver enzymes.  Fever. EXAM: CT ABDOMEN AND PELVIS WITH CONTRAST TECHNIQUE: Multidetector CT imaging of  the abdomen and pelvis was performed using the standard protocol following bolus administration of intravenous contrast. RADIATION DOSE REDUCTION: This exam was performed according to the departmental dose-optimization program which includes automated exposure control, adjustment of the mA and/or kV according to patient size and/or use of iterative reconstruction technique. CONTRAST:  OMNIPAQUE  IOHEXOL  300 MG/ML  SOLN COMPARISON:  10/20/2023. FINDINGS: Lower chest: Mild dependent atelectasis and/or scarring. Atherosclerotic calcification of the aorta, aortic valve and coronary arteries. Heart is enlarged. No pericardial or pleural effusion. Distal esophagus is grossly unremarkable. Hepatobiliary: Liver margin may be minimally irregular with hypertrophy of the left hepatic lobe. Cholecystectomy. No biliary ductal dilatation. Pancreas: Negative. Spleen: Negative. Adrenals/Urinary Tract: Adrenal glands are unremarkable. Somewhat striated appearance of the right kidney with perinephric edema. Small low-attenuation lesion in the left kidney. No specific follow-up necessary. Ureters are decompressed. Bladder is grossly unremarkable. Stomach/Bowel: Stomach is unremarkable. Incidentally noted duodenal diverticulum. Small bowel, appendix and colon are otherwise unremarkable. Vascular/Lymphatic: Atherosclerotic calcification of the aorta. No pathologically enlarged lymph nodes. Reproductive: Prostate is enlarged. Other: Small bilateral inguinal hernias contain fat. No free fluid. Mesenteries and peritoneum are unremarkable. Musculoskeletal: Osteopenia.  Degenerative changes in the spine. IMPRESSION: 1. Striated  appearance of the right kidney with perinephric edema, findings indicative of pyelonephritis. 2. Hepatic morphology indicative of cirrhosis. 3. Enlarged prostate. 4. Aortic atherosclerosis (ICD10-I70.0). Coronary artery calcification. Electronically Signed   By: Newell Eke M.D.   On: 02/24/2024 15:21   DG Chest 2 View Result Date: 02/24/2024 CLINICAL DATA:  Suspected sepsis. EXAM: CHEST - 2 VIEW COMPARISON:  10/16/2023. FINDINGS: Bilateral lung fields are clear. Bilateral costophrenic angles are clear. Normal cardio-mediastinal silhouette. No acute osseous abnormalities. The soft tissues are within normal limits. IMPRESSION: No active cardiopulmonary disease. Electronically Signed   By: Ree Molt M.D.   On: 02/24/2024 12:21     .Critical Care  Performed by: Daralene Lonni BIRCH, PA-C Authorized by: Daralene Lonni BIRCH, PA-C   Critical care provider statement:    Critical care time (minutes):  35   Critical care was necessary to treat or prevent imminent or life-threatening deterioration of the following conditions:  Sepsis   Critical care was time spent personally by me on the following activities:  Development of treatment plan with patient or surrogate, discussions with consultants, evaluation of patient's response to treatment, examination of patient, ordering and review of laboratory studies, ordering and review of radiographic studies, ordering and performing treatments and interventions, pulse oximetry, re-evaluation of patient's condition and review of old charts   I assumed direction of critical care for this patient from another provider in my specialty: no     Care discussed with: admitting provider      Medications Ordered in the ED  lactated ringers  infusion ( Intravenous New Bag/Given 02/24/24 1624)  acetaminophen  (TYLENOL ) tablet 650 mg (0 mg Oral Hold 02/24/24 1256)  acetaminophen  (TYLENOL ) tablet 650 mg (has no administration in time range)    Or  acetaminophen   (TYLENOL ) suppository 650 mg (has no administration in time range)  ondansetron  (ZOFRAN ) tablet 4 mg (has no administration in time range)    Or  ondansetron  (ZOFRAN ) injection 4 mg (has no administration in time range)  insulin  aspart (novoLOG ) injection 0-9 Units (has no administration in time range)  insulin  aspart (novoLOG ) injection 0-5 Units (has no administration in time range)  amLODipine  (NORVASC ) tablet 10 mg (has no administration in time range)  aspirin  EC tablet 81 mg (has no administration in time range)  cefTRIAXone  (ROCEPHIN ) 2 g in sodium chloride  0.9 % 100 mL IVPB (has no administration in time range)  fish oil-omega-3 fatty acids  capsule 1 g (has no administration in time range)  pantoprazole  (PROTONIX ) EC tablet 40 mg (has no administration in time range)  rosuvastatin  (CRESTOR ) tablet 10 mg (has no administration in time range)  famotidine  (PEPCID ) tablet 20 mg (has no administration in time range)  lactated ringers  bolus 1,000 mL (0 mLs Intravenous Stopped 02/24/24 1350)    And  lactated ringers  bolus 1,000 mL (0 mLs Intravenous Stopped 02/24/24 1459)  cefTRIAXone  (ROCEPHIN ) 1 g in sodium chloride  0.9 % 100 mL IVPB (0 g Intravenous Stopped 02/24/24 1342)  potassium chloride  SA (KLOR-CON  M) CR tablet 40 mEq (40 mEq Oral Given 02/24/24 1457)  magnesium  sulfate IVPB 2 g 50 mL (0 g Intravenous Stopped 02/24/24 1556)  iohexol  (OMNIPAQUE ) 300 MG/ML solution 100 mL (100 mLs Intravenous Contrast Given 02/24/24 1430)                                    Medical Decision Making Amount and/or Complexity of Data Reviewed Labs: ordered. Radiology: ordered.  Risk OTC drugs. Prescription drug management. Decision regarding hospitalization.   This patient presents to the ED for concern of fever, weakness, this involves an extensive number of treatment options, and is a complaint that carries with it a high risk of complications and morbidity.  The differential diagnosis includes  sepsis, pneumonia, urinary tract infection, pyelonephritis, acute viral syndrome, tickborne illness   Co morbidities that complicate the patient evaluation  Diabetes   Additional history obtained:  Additional history obtained from family External records from outside source obtained and reviewed including medical records   Lab Tests:  I Ordered, and personally interpreted labs.  The pertinent results include: Leukocytosis, anemia, hyponatremia, hypokalemia, hypomagnesemia, elevated glucose, normal kidney function, elevated bilirubin, normal lactic acid, urinalysis with large blood, negative viral swab   Imaging Studies ordered:  I ordered imaging studies including CT scan of abdomen pelvis, chest x-ray I independently visualized and interpreted imaging which showed right perinephric stranding, no acute cardiopulmonary process I agree with the radiologist interpretation   Cardiac Monitoring: / EKG:  The patient was maintained on a cardiac monitor.  I personally viewed and interpreted the cardiac monitored which showed an underlying rhythm of: Normal sinus rhythm, no ST/T wave changes, no ischemic changes, no STEMI   Consultations Obtained:  I requested consultation with the hospitalist,  and discussed lab and imaging findings as well as pertinent plan - they recommend: Admission   Problem List / ED Course / Critical interventions / Medication management  Patient is doing well at this time and does remain stable.  Discussed with patient that we will plan for admission to the hospital service for further workup and management.  Patient does have CT scan findings for pyelonephritis at this time.  He did meet sepsis criteria on presentation and has been given IV fluids, IV antibiotics.  No other acute surgical process was noted on CT scan of the abdomen and pelvis.  He does have an elevation of his bilirubin with no changes within the biliary ducts and liver enzymes are within normal  limits.  He had negative viral swab.  Chest x-ray demonstrated no indication for pneumonia.  Magnesium  and potassium were repleted in the emergency department.  He was given IV fluids.  Have discussed patient case with Dr.  Madera with the hospital service who has excepted for admission at this time. I ordered medication including magnesium , potassium, Rocephin , IV fluids for sepsis, hyponatremia, hypokalemia, hypomagnesemia Reevaluation of the patient after these medicines showed that the patient improved I have reviewed the patients home medicines and have made adjustments as needed   Social Determinants of Health:  None   Test / Admission - Considered:  Admission     Final diagnoses:  Sepsis, due to unspecified organism, unspecified whether acute organ dysfunction present [facility])  Pyelonephritis  Hypokalemia  Hypomagnesemia  Hyperglycemia    ED Discharge Orders     None          Daralene Lonni JONETTA DEVONNA 02/24/24 1636    Francesca Elsie CROME, MD 02/24/24 2101

## 2024-02-24 NOTE — H&P (Signed)
 History and Physical    Patient: Jorge Garcia FMW:[id number] DOB: 07/09/1946 DOA: 02/24/2024 DOS: the patient was seen and examined on 02/24/2024 PCP: Lunger Mustard, FNP   Patient coming from: Home  Chief Complaint:  Chief Complaint  Patient presents with   possible sepsis   HPI: Anthoni Geerts is a 78 y.o. male with medical history significant of hypertension, mixed hyperlipidemia, type 2 diabetes mellitus and GERD; who presented to the emergency department secondary to intermittent fever chills, confusion and not feeling well.  Symptom has been present for the last 3 days or so and patient expressed increased weakness and 2 falls at home.  He was seen by his PCP who has concern for most likely early sepsis development from UTI.  Patient reports increased frequency and mild suprapubic discomfort.  There is no chest pain, no shortness of breath, no nausea/vomiting, sick contacts, melena/hematochezia or any focal weaknesses.  Workup in the ED demonstrating significant changes for hematuria and increased leukocyte esterase; Elevated WBCs and positive fever with tachycardia.  Early sepsis protocol activated, fluid resuscitation provided, antipyretics given and after cultures taken, IV antibiotic were initiated.  TRH contacted to place patient in the hospital for further evaluation and management.   Review of Systems: As mentioned in the history of present illness. All other systems reviewed and are negative. Past Medical History:  Diagnosis Date   Arthritis    back   Cataract    bilateral sx   Diabetes mellitus without complication (HCC)    on meds   GERD (gastroesophageal reflux disease)    on meds   Hyperlipidemia    on meds   Hypertension    on meds   Internal hemorrhoids    Tubulovillous adenoma of colon 2009   Vitamin D  deficiency    Past Surgical History:  Procedure Laterality Date   BILIARY STENT PLACEMENT  11/02/2023   Procedure: INSERTION, STENT, BILE DUCT;   Surgeon: Wilhelmenia Aloha Raddle., MD;  Location: [facility] ENDOSCOPY;  Service: Gastroenterology;;   BIOPSY OF SKIN SUBCUTANEOUS TISSUE AND/OR MUCOUS MEMBRANE  10/28/2023   Procedure: BIOPSY;  Surgeon: Avram Lupita BRAVO, MD;  Location: [facility] ENDOSCOPY;  Service: Gastroenterology;;   CATARACT EXTRACTION, BILATERAL Bilateral    CHOLECYSTECTOMY N/A 10/29/2023   Procedure: LAPAROSCOPIC CHOLECYSTECTOMY WITH INTRAOPERATIVE CHOLANGIOGRAM;  Surgeon: Stevie Herlene Righter, MD;  Location: [facility] OR;  Service: General;  Laterality: N/A;   COLONOSCOPY  12/01/2013   COLONOSCOPY  2020   MS-MAC-suprep(good)-tics/hems/HPPx1/TAx4   COLONOSCOPY  04/22/2022   ERCP N/A 10/28/2023   Procedure: ERCP, WITH INTERVENTION IF INDICATED;  Surgeon: Avram Lupita BRAVO, MD;  Location: [facility] ENDOSCOPY;  Service: Gastroenterology;  Laterality: N/A;   ERCP N/A 11/02/2023   Procedure: ERCP, WITH INTERVENTION IF INDICATED;  Surgeon: Wilhelmenia Aloha Raddle., MD;  Location: [facility] ENDOSCOPY;  Service: Gastroenterology;  Laterality: N/A;   ERCP N/A 01/07/2024   Procedure: ERCP, WITH INTERVENTION IF INDICATED;  Surgeon: Wilhelmenia Aloha Raddle., MD;  Location: [facility] ENDOSCOPY;  Service: Gastroenterology;  Laterality: N/A;   ESOPHAGOGASTRODUODENOSCOPY N/A 10/28/2023   Procedure: EGD (ESOPHAGOGASTRODUODENOSCOPY);  Surgeon: Avram Lupita BRAVO, MD;  Location: [facility] ENDOSCOPY;  Service: Gastroenterology;  Laterality: N/A;   ESOPHAGOGASTRODUODENOSCOPY N/A 01/07/2024   Procedure: EGD (ESOPHAGOGASTRODUODENOSCOPY);  Surgeon: Wilhelmenia Aloha Raddle., MD;  Location: [facility] ENDOSCOPY;  Service: Gastroenterology;  Laterality: N/A;   FOREIGN BODY REMOVAL  01/07/2024   Procedure: REMOVAL, FOREIGN BODY;  Surgeon: Wilhelmenia Aloha Raddle., MD;  Location: [facility] ENDOSCOPY;  Service: Gastroenterology;;   HEMORRHOID SURGERY  1978   PANCREATIC  STENT PLACEMENT  10/28/2023   Procedure: INSERTION, STENT, PANCREATIC DUCT;  Surgeon: Avram Lupita BRAVO, MD;  Location: [facility] ENDOSCOPY;  Service: Gastroenterology;;    POLYPECTOMY  2020   HPPx1/TAx4   STENT REMOVAL  01/07/2024   Procedure: STENT REMOVAL;  Surgeon: Wilhelmenia Aloha Raddle., MD;  Location: [facility] ENDOSCOPY;  Service: Gastroenterology;;   STONE EXTRACTION WITH BASKET  11/02/2023   Procedure: ERCP, WITH LITHROTRIPSY OR REMOVAL OF COMMON BILE DUCT CALCULUS;  Surgeon: Wilhelmenia Aloha Raddle., MD;  Location: [facility] ENDOSCOPY;  Service: Gastroenterology;;   UPPER GASTROINTESTINAL ENDOSCOPY     WISDOM TOOTH EXTRACTION     Social History:  reports that he quit smoking about 18 years ago. His smoking use included cigarettes. He started smoking about 58 years ago. He has a 80 pack-year smoking history. He has never used smokeless tobacco. He reports that he does not drink alcohol and does not use drugs.  No Known Allergies  Family History  Problem Relation Age of Onset   Diabetes Mother    Hypertension Mother    Stroke Mother 47   Diabetes Father    Hypertension Father    Heart attack Father 85   Colon cancer Neg Hx    Colon polyps Neg Hx    Esophageal cancer Neg Hx    Stomach cancer Neg Hx    Rectal cancer Neg Hx     Prior to Admission medications   Medication Sig Start Date End Date Taking? Authorizing Provider  amLODipine  (NORVASC ) 10 MG tablet Take 1 tablet (10 mg total) by mouth daily. 12/10/23  Yes Hilley, Mary-Margaret, FNP  ciprofloxacin  (CIPRO ) 500 MG tablet Take 1 tablet (500 mg total) by mouth 2 (two) times daily. For prostate. Take all of these. 02/24/24  Yes Stacks, Butler, MD  fish oil-omega-3 fatty acids  1000 MG capsule Take 1 g by mouth 2 (two) times daily.   Yes [provider]  hydrochlorothiazide  (HYDRODIURIL ) 25 MG tablet Take 1 tablet (25 mg total) by mouth daily. 12/10/23  Yes Gladis, Mary-Margaret, FNP  metFORMIN  (GLUCOPHAGE ) 1000 MG tablet Take 1 tablet (1,000 mg total) by mouth 2 (two) times daily with a meal. (NEEDS TO BE SEEN BEFORE NEXT REFILL) 12/10/23  Yes Gladis, Mary-Margaret, FNP  pantoprazole  (PROTONIX ) 40 MG tablet  Take 1 tablet (40 mg total) by mouth 2 (two) times daily before a meal. 01/07/24  Yes Mansouraty, Aloha Raddle., MD  rosuvastatin  (CRESTOR ) 10 MG tablet Take 1 tablet (10 mg total) by mouth at bedtime. 12/10/23  Yes Devine, Mary-Margaret, FNP  aspirin  EC 81 MG tablet Take 81 mg by mouth at bedtime.    [provider]    Physical Exam: Vitals:   02/24/24 1245 02/24/24 1315 02/24/24 1415 02/24/24 1625  BP: (!) 150/67 (!) 145/61 133/67 138/60  Pulse: 96 91 70   Resp: (!) 26 (!) 25 19 20   Temp:    98.3 F (36.8 C)  TempSrc:    Oral  SpO2: 93% 97% 97%   Weight:      Height:       General exam: Alert, awake, oriented x 3; reporting generalized malaise and not feeling well. Respiratory system: Good saturation on room air; no using accessory muscle. Cardiovascular system: Sinus tachycardia present at time of admission; no rubs, no gallops, no JVD. Gastrointestinal system: Abdomen is nondistended, soft and without guarding; mild discomfort on palpation of his suprapubic area.  Positive bowel sounds. Central nervous system: Moving 4 limbs spontaneously.  No focal neurological deficits. Extremities:  No C/C/E, +pedal pulses Skin: No petechiae. Psychiatry: Judgement and insight appear normal. Mood & affect appropriate.   Data Reviewed: Lactic acid: 1.9 Comprehensive metabolic panel: Sodium 131, potassium 3.3, chloride 96, bicarb 20, glucose 228, BUN 26, creatinine 1.09, normal LFTs and GFR >60 CBC: White blood cell 13.8, hemoglobin 12.1 and platelet count 1 91K Magnesium : 1.2 Urinalysis: More than 500 glucose, large amount of hemoglobin, negative nitrite 6+ leukocytes.  Specific gravity 1.030  Assessment and Plan: 1-sepsis secondary to UTI - Follow culture result - Continue to maintain adequate hydration - IV antibiotic has been started - As needed antipyretics will be provided - Continue supportive care and follow clinical response.  2-type 2 diabetes mellitus with hyperglycemia -  Recent A1c 7.5 - Holding oral hypoglycemic agents while inpatient - Sliding scale insulin  has been started - Follow CBG fluctuation.  3-hypertension - Continue amlodipine   - Initially while providing fluid resuscitation we will hold HCTZ - As needed hydralazine  has been ordered.  4-GERD - Continue PPI and nightly Pepcid   5-mixed hyperlipidemia - Continue Crestor  and fish oil  6-hypokalemia/hypomagnesemia - Continue electrolyte repletion and follow trend - Telemetry monitoring will be provided.  7-hyponatremia - In the setting of dehydration, slightly hyperglycemic status - Will provide fluid resuscitation - Correct elevated CBGs and follow electrolytes trend.    Advance Care Planning:   Code Status: Full Code   Consults: None  Family Communication: No family at bedside.  Severity of Illness: The appropriate patient status for this patient is INPATIENT. Inpatient status is judged to be reasonable and necessary in order to provide the required intensity of service to ensure the patient's safety. The patient's presenting symptoms, physical exam findings, and initial radiographic and laboratory data in the context of their chronic comorbidities is felt to place them at high risk for further clinical deterioration. Furthermore, it is not anticipated that the patient will be medically stable for discharge from the hospital within 2 midnights of admission.   * I certify that at the point of admission it is my clinical judgment that the patient will require inpatient hospital care spanning beyond 2 midnights from the point of admission due to high intensity of service, high risk for further deterioration and high frequency of surveillance required.*  Author: Eric Nunnery, MD 02/24/2024 4:27 PM  For on call review [email or web address].

## 2024-02-24 NOTE — ED Triage Notes (Signed)
 Pt arrived via POV from PCP office for further evaluation of possible sepsis. Pt reports full body tremors, fever and multiple falls at home recently. Pts spouse reports Pt has been experiencing confusion as well. Pt had a UA/culture and blood work performed at his PCP Office PTA but results are not available at this time.

## 2024-02-25 DIAGNOSIS — E876 Hypokalemia: Secondary | ICD-10-CM | POA: Diagnosis not present

## 2024-02-25 DIAGNOSIS — R739 Hyperglycemia, unspecified: Secondary | ICD-10-CM | POA: Diagnosis not present

## 2024-02-25 DIAGNOSIS — A419 Sepsis, unspecified organism: Secondary | ICD-10-CM | POA: Diagnosis not present

## 2024-02-25 LAB — CMP14+EGFR
ALT: 26 IU/L (ref 0–44)
AST: 27 IU/L (ref 0–40)
Albumin: 4.4 g/dL (ref 3.8–4.8)
Alkaline Phosphatase: 83 IU/L (ref 44–121)
BUN/Creatinine Ratio: 19 (ref 10–24)
BUN: 24 mg/dL (ref 8–27)
Bilirubin Total: 4 mg/dL — AB (ref 0.0–1.2)
CO2: 18 mmol/L — ABNORMAL LOW (ref 20–29)
Calcium: 9.8 mg/dL (ref 8.6–10.2)
Chloride: 97 mmol/L (ref 96–106)
Creatinine, Ser: 1.27 mg/dL (ref 0.76–1.27)
Globulin, Total: 2.9 g/dL (ref 1.5–4.5)
Glucose: 212 mg/dL — ABNORMAL HIGH (ref 70–99)
Potassium: 4.6 mmol/L (ref 3.5–5.2)
Sodium: 138 mmol/L (ref 134–144)
Total Protein: 7.3 g/dL (ref 6.0–8.5)
eGFR: 59 mL/min/1.73 — ABNORMAL LOW (ref >59)

## 2024-02-25 LAB — CBC
HCT: 32.2 % — ABNORMAL LOW (ref 39.0–52.0)
Hemoglobin: 10.7 g/dL — ABNORMAL LOW (ref 13.0–17.0)
MCH: 27.6 pg (ref 26.0–34.0)
MCHC: 33.2 g/dL (ref 30.0–36.0)
MCV: 83.2 fL (ref 80.0–100.0)
Platelets: 155 10*3/uL (ref 150–400)
RBC: 3.87 MIL/uL — ABNORMAL LOW (ref 4.22–5.81)
RDW: 13.8 % (ref 11.5–15.5)
WBC: 9.2 10*3/uL (ref 4.0–10.5)
nRBC: 0 % (ref 0.0–0.2)

## 2024-02-25 LAB — URINE CULTURE: Culture: NO GROWTH

## 2024-02-25 LAB — CBC WITH DIFFERENTIAL/PLATELET
Basophils Absolute: 0 x10E3/uL (ref 0.0–0.2)
Basos: 0 %
EOS (ABSOLUTE): 0 x10E3/uL (ref 0.0–0.4)
Eos: 0 %
Hematocrit: 41.1 % (ref 37.5–51.0)
Hemoglobin: 12.8 g/dL — ABNORMAL LOW (ref 13.0–17.7)
Immature Grans (Abs): 0 x10E3/uL (ref 0.0–0.1)
Immature Granulocytes: 0 %
Lymphocytes Absolute: 2.4 x10E3/uL (ref 0.7–3.1)
Lymphs: 13 %
MCH: 26.6 pg (ref 26.6–33.0)
MCHC: 31.1 g/dL — ABNORMAL LOW (ref 31.5–35.7)
MCV: 85 fL (ref 79–97)
Monocytes Absolute: 1.6 x10E3/uL — ABNORMAL HIGH (ref 0.1–0.9)
Monocytes: 9 %
Neutrophils Absolute: 15 x10E3/uL — ABNORMAL HIGH (ref 1.4–7.0)
Neutrophils: 78 %
Platelets: 247 x10E3/uL (ref 150–450)
RBC: 4.82 x10E6/uL (ref 4.14–5.80)
RDW: 14 % (ref 11.6–15.4)
WBC: 19.2 x10E3/uL — ABNORMAL HIGH (ref 3.4–10.8)

## 2024-02-25 LAB — BASIC METABOLIC PANEL WITH GFR
Anion gap: 5 (ref 5–15)
BUN: 18 mg/dL (ref 8–23)
CO2: 26 mmol/L (ref 22–32)
Calcium: 8.6 mg/dL — ABNORMAL LOW (ref 8.9–10.3)
Chloride: 105 mmol/L (ref 98–111)
Creatinine, Ser: 1 mg/dL (ref 0.61–1.24)
GFR, Estimated: >60 mL/min (ref >60)
Glucose, Bld: 168 mg/dL — ABNORMAL HIGH (ref 70–99)
Potassium: 4.2 mmol/L (ref 3.5–5.1)
Sodium: 136 mmol/L (ref 135–145)

## 2024-02-25 LAB — GLUCOSE, CAPILLARY
Glucose-Capillary: 169 mg/dL — ABNORMAL HIGH (ref 70–99)
Glucose-Capillary: 255 mg/dL — ABNORMAL HIGH (ref 70–99)
Glucose-Capillary: 205 mg/dL — ABNORMAL HIGH (ref 70–99)
Glucose-Capillary: 217 mg/dL — ABNORMAL HIGH (ref 70–99)

## 2024-02-25 NOTE — Progress Notes (Signed)
  Progress Note   Patient: Jorge Garcia FMW:[id number] DOB: 04/13/1946 DOA: 02/24/2024     1 DOS: the patient was seen and examined on 02/25/2024   Brief hospital admission narrative: Jorge Garcia is a 78 y.o. male with medical history significant of hypertension, mixed hyperlipidemia, type 2 diabetes mellitus and GERD; who presented to the emergency department secondary to intermittent fever chills, confusion and not feeling well.  Symptom has been present for the last 3 days or so and patient expressed increased weakness and 2 falls at home.  He was seen by his PCP who has concern for most likely early sepsis development from UTI.   Patient reports increased frequency and mild suprapubic discomfort.  There is no chest pain, no shortness of breath, no nausea/vomiting, sick contacts, melena/hematochezia or any focal weaknesses.   Workup in the ED demonstrating significant changes for hematuria and increased leukocyte esterase; Elevated WBCs and positive fever with tachycardia.  Early sepsis protocol activated, fluid resuscitation provided, antipyretics given and after cultures taken, IV antibiotic were initiated.  TRH contacted to place patient in the hospital for further evaluation and management.  Assessment and plan 1-sepsis secondary to UTI -Reports feeling better - Continue to maintain adequate hydration - Patient reporting no hematuria - Follow cultures results - Continue IV antibiotics - Will provide as needed antipyretics and continue supportive care.  2-type 2 diabetes mellitus - Continue holding oral hypoglycemic agents while inpatient - Continue sliding scale insulin  - Follow CBG fluctuation.  3-hypertension - Continue current antihypertensive agents - Follow vital signs.  4-GERD - Continue PPI and Pepcid .  5-mixed hyperlipidemia - Continue Crestor  and fish oil. - Heart healthy/low-fat diet discussed with patient.  6-hypokalemia/hypomagnesemia - Continue electrolyte  repletion - Follow electrolytes trend - Continue telemetry monitoring.  7-hyponatremia - In the setting of dehydration and slightly hyperglycemia at time of admission - Continue to follow cholesterol - Maintain adequate hydration - For electrolytes.  Subjective:  In no major distress; reporting no hematuria no abdominal pain.  At time of examination afebrile and feeling better.  Physical Exam: Vitals:   02/25/24 0618 02/25/24 0800 02/25/24 0845 02/25/24 1323  BP: (!) 141/69 (!) 143/65 136/65 132/61  Pulse: 77  66 65  Resp: 20  17 18   Temp: 98 F (36.7 C)  98.7 F (37.1 C) 98.4 F (36.9 C)  TempSrc: Oral  Oral Oral  SpO2: 94%  93% 96%  Weight:      Height:       General exam: Alert, awake, oriented x 3; in no acute distress. Respiratory system: Clear to auscultation. Respiratory effort normal. Cardiovascular system:RRR. No murmurs, rubs, gallops. Gastrointestinal system: Abdomen is nondistended, soft and nontender. No organomegaly or masses felt. Normal bowel sounds heard. Central nervous system: Alert and oriented. No focal neurological deficits. Extremities: No cyanosis or clubbing.  No edema Skin: No petechiae. Psychiatry: Judgement and insight appear normal. Mood & affect appropriate.    Data Reviewed: Basic metabolic panel: Sodium 136, potassium 4.2, chloride 105, bicarb 26, BUN 18, creatinine 1.0 and GFR >60 CBC: WBCs 9.2, hemoglobin 10.7 and platelet count 1 55K  Family Communication: Significant other at bedside.  Disposition: Status is: Inpatient Remains inpatient appropriate because: Continue IV antibiotics.  Anticipating discharge back home once medically stable  Time spent: 50 minutes  Author: Eric Nunnery, MD 02/25/2024 6:47 PM  For on call review [email or web address].

## 2024-02-25 NOTE — Plan of Care (Signed)

## 2024-02-25 NOTE — Progress Notes (Signed)
   02/25/24 1350  TOC Brief Assessment  Insurance and Status Reviewed  Patient has primary care physician Yes  Home environment has been reviewed Home  Prior level of function: Independent  Prior/Current Home Services No current home services  Social Drivers of Health Review SDOH reviewed no interventions necessary  Readmission risk has been reviewed Yes  Transition of care needs no transition of care needs at this time   Transition of Care Department [facility]) has reviewed patient and no TOC needs have been identified at this time. We will continue to monitor patient advancement through interdisciplinary progression rounds. If new patient transition needs arise, please place a TOC consult.

## 2024-02-25 NOTE — Progress Notes (Signed)
 Patient ran a fever last night of 102.2 at 9pm, tylenol  administered and MD notified, no new orders placed, continued to monitor throughout the night and temp reached 99.6 and current temp is 97.9. Patient slept well throughout the night and wife present at bedside.

## 2024-02-26 DIAGNOSIS — R739 Hyperglycemia, unspecified: Secondary | ICD-10-CM

## 2024-02-26 DIAGNOSIS — E876 Hypokalemia: Secondary | ICD-10-CM

## 2024-02-26 DIAGNOSIS — A419 Sepsis, unspecified organism: Secondary | ICD-10-CM | POA: Diagnosis not present

## 2024-02-26 DIAGNOSIS — N12 Tubulo-interstitial nephritis, not specified as acute or chronic: Secondary | ICD-10-CM

## 2024-02-26 LAB — GLUCOSE, CAPILLARY: Glucose-Capillary: 177 mg/dL — ABNORMAL HIGH (ref 70–99)

## 2024-02-26 MED ORDER — CEFADROXIL 500 MG PO CAPS: 500.0000 mg | ORAL_CAPSULE | Freq: Two times a day (BID) | ORAL | 0 refills | Status: AC

## 2024-02-26 MED ORDER — CEFADROXIL 500 MG PO CAPS
500.0000 mg | ORAL_CAPSULE | Freq: Two times a day (BID) | ORAL | Status: DC
  Administered 2024-02-26: 500 mg via ORAL
  Filled 2024-02-26 (×3): qty 1

## 2024-02-26 NOTE — Progress Notes (Signed)
 Mobility Specialist Progress Note:    02/26/24 0840  Mobility  Activity Ambulated with assistance in hallway  Level of Assistance Modified independent, requires aide device or extra time  Assistive Device None  Distance Ambulated (ft) 250 ft  Range of Motion/Exercises Active;All extremities  Activity Response Tolerated well  Mobility Referral Yes  Mobility visit 1 Mobility  Mobility Specialist Start Time (ACUTE ONLY) 0840  Mobility Specialist Stop Time (ACUTE ONLY) 0900  Mobility Specialist Time Calculation (min) (ACUTE ONLY) 20 min   Pt received in bed, wife in room. Agreeable to mobility, ModI to stand and ambulate with no AD. Tolerated well, asx throughout. Returned pt supine, all needs met.  Jorge Garcia Mobility Specialist Please contact via Special educational needs teacher or  Rehab office at [phone]

## 2024-02-26 NOTE — Plan of Care (Signed)
  Problem: Education: Goal: Ability to describe self-care measures that may prevent or decrease complications (Diabetes Survival Skills Education) will improve Outcome: Progressing Goal: Individualized Educational Video(s) Outcome: Progressing   Problem: Coping: Goal: Ability to adjust to condition or change in health will improve Outcome: Progressing   Problem: Fluid Volume: Goal: Ability to maintain a balanced intake and output will improve Outcome: Progressing   Problem: Health Behavior/Discharge Planning: Goal: Ability to identify and utilize available resources and services will improve Outcome: Progressing Goal: Ability to manage health-related needs will improve Outcome: Progressing   Problem: Metabolic: Goal: Ability to maintain appropriate glucose levels will improve Outcome: Progressing   Problem: Education: Goal: Knowledge of General Education information will improve Description: Including pain rating scale, medication(s)/side effects and non-pharmacologic comfort measures Outcome: Progressing   Problem: Health Behavior/Discharge Planning: Goal: Ability to manage health-related needs will improve Outcome: Progressing   Problem: Clinical Measurements: Goal: Ability to maintain clinical measurements within normal limits will improve Outcome: Progressing Goal: Will remain free from infection Outcome: Progressing Goal: Diagnostic test results will improve Outcome: Progressing Goal: Respiratory complications will improve Outcome: Progressing Goal: Cardiovascular complication will be avoided Outcome: Progressing   Problem: Coping: Goal: Level of anxiety will decrease Outcome: Progressing   Problem: Safety: Goal: Ability to remain free from injury will improve Outcome: Progressing

## 2024-02-26 NOTE — Discharge Summary (Signed)
 Physician Discharge Summary   Patient: Jorge Garcia MRN: [id number] DOB: [birth year]  Admit date:     02/24/2024  Discharge date: 02/26/24  Discharge Physician: Jorge Garcia   PCP: Jorge Mustard, FNP   Recommendations at discharge:  Repeat CBC to follow hemoglobin trend/stability Repeat basic metabolic panel to follow-up electrolytes renal function Reassess patient's CBG/A1c with further adjustment to hypoglycemia regimen as needed.  Discharge Diagnoses: Principal Problem:   Sepsis secondary to UTI [facility]) Active Problems:   Pyelonephritis   Hypokalemia   Hypomagnesemia   Hyperglycemia  Brief hospital admission narrative: Jorge Garcia is a 78 y.o. male with medical history significant of hypertension, mixed hyperlipidemia, type 2 diabetes mellitus and GERD; who presented to the emergency department secondary to intermittent fever chills, confusion and not feeling well.  Symptom has been present for the last 3 days or so and patient expressed increased weakness and 2 falls at home.  He was seen by his PCP who has concern for most likely early sepsis development from UTI.   Patient reports increased frequency and mild suprapubic discomfort.  There is no chest pain, no shortness of breath, no nausea/vomiting, sick contacts, melena/hematochezia or any focal weaknesses.   Workup in the ED demonstrating significant changes for hematuria and increased leukocyte esterase; Elevated WBCs and positive fever with tachycardia.  Early sepsis protocol activated, fluid resuscitation provided, antipyretics given and after cultures taken, IV antibiotic were initiated.  TRH contacted to place patient in the hospital for further evaluation and management.  Assessment and Plan: 1-sepsis secondary to UTI -Reports feeling better, has remained afebrile for over 24 hours and is feeling ready to go home. - Hemodynamically stable. - Patient advised to maintain adequate hydration - Patient reporting no  hematuria or dysuria at time of discharge. - Antibiotics transition to oral cefadroxil to complete therapy -Patient to follow-up with PCP after discharge.   2-type 2 diabetes mellitus - Resume home oral hypoglycemic agents and follow CBG fluctuation to further adjust hypoglycemia regimen as required - Patient advised to follow modified carbohydrate diet.   3-hypertension - Continue current antihypertensive agents - Patient instructed to watch sodium intake and to follow heart healthy diet.   4-GERD - Continue PPI and Pepcid .   5-mixed hyperlipidemia - Continue Crestor  and fish oil. - Heart healthy/low-fat diet discussed with patient.   6-hypokalemia/hypomagnesemia - Improved/stabilized after repletion - Patient advised to maintain adequate hydration - Repeat basic metabolic panel to follow electrolytes trend/stability at follow-up visit.   7-hyponatremia - In the setting of dehydration and slightly hyperglycemia at time of admission - Patient's use of diuretics also playing a role - Stabilize after fluid resuscitation - Patient advised to maintain adequate hydration - Repeat basic metabolic panel to follow ultralights trend.  Consultants: None Procedures performed: See below for x-ray reports. Disposition: Home Diet recommendation: Heart healthy/modified carbohydrate diet.  DISCHARGE MEDICATION: Allergies as of 02/26/2024   No Known Allergies      Medication List     STOP taking these medications    ciprofloxacin  500 MG tablet Commonly known as: Cipro        TAKE these medications    amLODipine  10 MG tablet Commonly known as: NORVASC  Take 1 tablet (10 mg total) by mouth daily.   aspirin  EC 81 MG tablet Take 81 mg by mouth at bedtime.   cefadroxil 500 MG capsule Commonly known as: DURICEF Take 1 capsule (500 mg total) by mouth 2 (two) times daily for 11 doses.   fish oil-omega-3 fatty acids   1000 MG capsule Take 1 g by mouth 2 (two) times daily.    hydrochlorothiazide  25 MG tablet Commonly known as: HYDRODIURIL  Take 1 tablet (25 mg total) by mouth daily.   metFORMIN  1000 MG tablet Commonly known as: GLUCOPHAGE  Take 1 tablet (1,000 mg total) by mouth 2 (two) times daily with a meal. (NEEDS TO BE SEEN BEFORE NEXT REFILL)   pantoprazole  40 MG tablet Commonly known as: PROTONIX  Take 1 tablet (40 mg total) by mouth 2 (two) times daily before a meal.   rosuvastatin  10 MG tablet Commonly known as: CRESTOR  Take 1 tablet (10 mg total) by mouth at bedtime.        Follow-up Information     Jorge Mustard, FNP. Schedule an appointment as soon as possible for a visit in 10 day(s).   Specialty: Family Medicine Contact information: [address] [place] [address] [phone]                Discharge Exam: Jorge Garcia   02/24/24 1157 02/24/24 1825  Weight: 93.3 kg 93 kg   General exam: Alert, awake, oriented x 3 Respiratory system: Clear to auscultation. Respiratory effort normal. Cardiovascular system:RRR. No murmurs, rubs, gallops. Gastrointestinal system: Abdomen is nondistended, soft and nontender. No organomegaly or masses felt. Normal bowel sounds heard. Central nervous system: Alert and oriented. No focal neurological deficits. Extremities: No C/C/E, +pedal pulses Skin: No rashes, lesions or ulcers Psychiatry: Judgement and insight appear normal. Mood & affect appropriate.    Condition at discharge: Stable and improved.  The results of significant diagnostics from this hospitalization (including imaging, microbiology, ancillary and laboratory) are listed below for reference.   Imaging Studies: CT ABDOMEN PELVIS W CONTRAST Result Date: 02/24/2024 CLINICAL DATA:  Abdominal pain, elevated liver enzymes.  Fever. EXAM: CT ABDOMEN AND PELVIS WITH CONTRAST TECHNIQUE: Multidetector CT imaging of the abdomen and pelvis was performed using the standard protocol following bolus administration of  intravenous contrast. RADIATION DOSE REDUCTION: This exam was performed according to the departmental dose-optimization program which includes automated exposure control, adjustment of the mA and/or kV according to patient size and/or use of iterative reconstruction technique. CONTRAST:  OMNIPAQUE  IOHEXOL  300 MG/ML  SOLN COMPARISON:  10/20/2023. FINDINGS: Lower chest: Mild dependent atelectasis and/or scarring. Atherosclerotic calcification of the aorta, aortic valve and coronary arteries. Heart is enlarged. No pericardial or pleural effusion. Distal esophagus is grossly unremarkable. Hepatobiliary: Liver margin may be minimally irregular with hypertrophy of the left hepatic lobe. Cholecystectomy. No biliary ductal dilatation. Pancreas: Negative. Spleen: Negative. Adrenals/Urinary Tract: Adrenal glands are unremarkable. Somewhat striated appearance of the right kidney with perinephric edema. Small low-attenuation lesion in the left kidney. No specific follow-up necessary. Ureters are decompressed. Bladder is grossly unremarkable. Stomach/Bowel: Stomach is unremarkable. Incidentally noted duodenal diverticulum. Small bowel, appendix and colon are otherwise unremarkable. Vascular/Lymphatic: Atherosclerotic calcification of the aorta. No pathologically enlarged lymph nodes. Reproductive: Prostate is enlarged. Other: Small bilateral inguinal hernias contain fat. No free fluid. Mesenteries and peritoneum are unremarkable. Musculoskeletal: Osteopenia.  Degenerative changes in the spine. IMPRESSION: 1. Striated appearance of the right kidney with perinephric edema, findings indicative of pyelonephritis. 2. Hepatic morphology indicative of cirrhosis. 3. Enlarged prostate. 4. Aortic atherosclerosis (ICD10-I70.0). Coronary artery calcification. Electronically Signed   By: Newell Eke M.D.   On: 02/24/2024 15:21   DG Chest 2 View Result Date: 02/24/2024 CLINICAL DATA:  Suspected sepsis. EXAM: CHEST - 2 VIEW  COMPARISON:  10/16/2023. FINDINGS: Bilateral lung fields are clear. Bilateral costophrenic angles  are clear. Normal cardio-mediastinal silhouette. No acute osseous abnormalities. The soft tissues are within normal limits. IMPRESSION: No active cardiopulmonary disease. Electronically Signed   By: Ree Molt M.D.   On: 02/24/2024 12:21    Microbiology: Results for orders placed or performed during the hospital encounter of 02/24/24  Culture, blood (Routine x 2)     Status: None (Preliminary result)   Collection Time: 02/24/24 12:05 PM   Specimen: BLOOD  Result Value Ref Range Status   Specimen Description BLOOD RIGHT ASSIST CONTROL  Final   Special Requests   Final    BOTTLES DRAWN AEROBIC AND ANAEROBIC Blood Culture adequate volume   Culture   Final    NO GROWTH 2 DAYS Performed at [facility], [address] [place], [place] [address]    Report Status PENDING  Incomplete  Resp panel by RT-PCR (RSV, Flu A&B, Covid) Anterior Nasal Swab     Status: None   Collection Time: 02/24/24 12:33 PM   Specimen: Anterior Nasal Swab  Result Value Ref Range Status   SARS Coronavirus 2 by RT PCR NEGATIVE NEGATIVE Final    Comment: (NOTE) SARS-CoV-2 target nucleic acids are NOT DETECTED.  The SARS-CoV-2 RNA is generally detectable in upper respiratory specimens during the acute phase of infection. The lowest concentration of SARS-CoV-2 viral copies this assay can detect is 138 copies/mL. A negative result does not preclude SARS-Cov-2 infection and should not be used as the sole basis for treatment or other patient management decisions. A negative result may occur with  improper specimen collection/handling, submission of specimen other than nasopharyngeal swab, presence of viral mutation(s) within the areas targeted by this assay, and inadequate number of viral copies(<138 copies/mL). A negative result must be combined with clinical observations, patient history, and  epidemiological information. The expected result is Negative.  Fact Sheet for Patients:  BloggerCourse.com  Fact Sheet for Healthcare Providers:  SeriousBroker.it  This test is no t yet approved or cleared by the [place]  FDA and  has been authorized for detection and/or diagnosis of SARS-CoV-2 by FDA under an Emergency Use Authorization (EUA). This EUA will remain  in effect (meaning this test can be used) for the duration of the COVID-19 declaration under Section 564(b)(1) of the Act, 21 U.S.C.section 360bbb-3(b)(1), unless the authorization is terminated  or revoked sooner.       Influenza A by PCR NEGATIVE NEGATIVE Final   Influenza B by PCR NEGATIVE NEGATIVE Final    Comment: (NOTE) The Xpert Xpress SARS-CoV-2/FLU/RSV plus assay is intended as an aid in the diagnosis of influenza from Nasopharyngeal swab specimens and should not be used as a sole basis for treatment. Nasal washings and aspirates are unacceptable for Xpert Xpress SARS-CoV-2/FLU/RSV testing.  Fact Sheet for Patients: BloggerCourse.com  Fact Sheet for Healthcare Providers: SeriousBroker.it  This test is not yet approved or cleared by the [place]  FDA and has been authorized for detection and/or diagnosis of SARS-CoV-2 by FDA under an Emergency Use Authorization (EUA). This EUA will remain in effect (meaning this test can be used) for the duration of the COVID-19 declaration under Section 564(b)(1) of the Act, 21 U.S.C. section 360bbb-3(b)(1), unless the authorization is terminated or revoked.     Resp Syncytial Virus by PCR NEGATIVE NEGATIVE Final    Comment: (NOTE) Fact Sheet for Patients: BloggerCourse.com  Fact Sheet for Healthcare Providers: SeriousBroker.it  This test is not yet approved or cleared by the [place]  FDA and has been  authorized for detection  and/or diagnosis of SARS-CoV-2 by FDA under an Emergency Use Authorization (EUA). This EUA will remain in effect (meaning this test can be used) for the duration of the COVID-19 declaration under Section 564(b)(1) of the Act, 21 U.S.C. section 360bbb-3(b)(1), unless the authorization is terminated or revoked.  Performed at [facility], [address] [place], [place] [address]   Culture, blood (Routine x 2)     Status: None (Preliminary result)   Collection Time: 02/24/24 12:41 PM   Specimen: BLOOD  Result Value Ref Range Status   Specimen Description BLOOD BLOOD LEFT ARM  Final   Special Requests   Final    BOTTLES DRAWN AEROBIC AND ANAEROBIC Blood Culture adequate volume   Culture   Final    NO GROWTH 2 DAYS Performed at [facility], [address] [place], [place] [address]    Report Status PENDING  Incomplete  Urine Culture     Status: None   Collection Time: 02/24/24  3:15 PM   Specimen: Urine, Clean Catch  Result Value Ref Range Status   Specimen Description   Final    URINE, CLEAN CATCH Performed at [facility], [address], [place], [place] [address]    Special Requests   Final    NONE Performed at [facility], [address], [place], [place] [address]    Culture   Final    NO GROWTH Performed at [facility], [address] [place], [place] [address]    Report Status 02/25/2024 FINAL  Final    Labs: CBC: Recent Labs  Lab 02/24/24 0957 02/24/24 1200 02/25/24 0457  WBC 19.2* 13.8* 9.2  NEUTROABS 15.0* 11.7*  --   HGB 12.8* 12.1* 10.7*  HCT 41.1 36.3* 32.2*  MCV 85 81.0 83.2  PLT 247 191 155   Basic Metabolic Panel: Recent Labs  Lab 02/24/24 0957 02/24/24 1200 02/24/24 1342 02/25/24 0457  NA 138 131*  --  136  K 4.6 3.3*  --  4.2  CL 97 96*  --  105  CO2 18* 20*  --  26  GLUCOSE 212* 229*  --  168*  BUN 24 26*  --  18  CREATININE 1.27 1.09  --  1.00  CALCIUM  9.8 9.1  --  8.6*  MG  --   --  1.2*  --     Liver Function Tests: Recent Labs  Lab 02/24/24 0957 02/24/24 1200  AST 27 35  ALT 26 30  ALKPHOS 83 63  BILITOT 4.0* 4.4*  PROT 7.3 7.3  ALBUMIN 4.4 3.6   CBG: Recent Labs  Lab 02/25/24 0745 02/25/24 1141 02/25/24 1554 02/25/24 2014 02/26/24 0717  GLUCAP 169* 255* 205* 217* 177*    Discharge time spent:  35 minutes.  Signed: Eric Nunnery, MD [facility] 02/26/2024

## 2024-02-28 ENCOUNTER — Encounter: Payer: Self-pay | Admitting: Family Medicine

## 2024-02-28 LAB — URINE CULTURE

## 2024-02-29 ENCOUNTER — Ambulatory Visit: Payer: Self-pay | Admitting: Family Medicine

## 2024-02-29 ENCOUNTER — Telehealth: Payer: Self-pay | Admitting: *Deleted

## 2024-02-29 LAB — CULTURE, BLOOD (ROUTINE X 2)
Culture: NO GROWTH
Culture: NO GROWTH
Special Requests: ADEQUATE
Special Requests: ADEQUATE

## 2024-02-29 NOTE — Transitions of Care (Post Inpatient/ED Visit) (Signed)
   02/29/2024  Name: Jorge Garcia MRN: [id number] DOB: 1946/03/24  Today's TOC FU Call Status: Today's TOC FU Call Status:: Unsuccessful Call (1st Attempt) Unsuccessful Call (1st Attempt) Date: 02/29/24  Attempted to reach the patient regarding the most recent Inpatient/ED visit.  Follow Up Plan: Additional outreach attempts will be made to reach the patient to complete the Transitions of Care (Post Inpatient/ED visit) call.   Mliss Creed [facility], BSN RN Care Manager/ Transition of Care [place]/ [facility] [phone]

## 2024-03-01 ENCOUNTER — Telehealth: Payer: Self-pay

## 2024-03-01 ENCOUNTER — Telehealth: Payer: Self-pay | Admitting: *Deleted

## 2024-03-01 NOTE — Transitions of Care (Post Inpatient/ED Visit) (Signed)
   03/01/2024  Name: Jorge Garcia MRN: [id number] DOB: 06/05/1946  Today's TOC FU Call Status: Today's TOC FU Call Status:: Unsuccessful Call (2nd Attempt) Unsuccessful Call (2nd Attempt) Date: 03/01/24  Attempted to reach the patient regarding the most recent Inpatient/ED visit.  Follow Up Plan: Additional outreach attempts will be made to reach the patient to complete the Transitions of Care (Post Inpatient/ED visit) call.   Cathlean Headland BSN RN [facility] Care Management Coordinator [email or web address] Direct Dial: [phone]  Fax: [phone] Website: [email or web address]

## 2024-03-01 NOTE — Telephone Encounter (Signed)
 Copied from CRM [id number]. Topic: Clinical - Lab/Test Results >> Mar 01, 2024 12:04 PM Delon DASEN wrote: Reason for CRM: read results verbatim, no questions

## 2024-03-02 ENCOUNTER — Telehealth: Payer: Self-pay

## 2024-03-02 NOTE — Transitions of Care (Post Inpatient/ED Visit) (Signed)
   03/02/2024  Name: [name] MRN: [id number] DOB: [birth year]  Today's TOC FU Call Status: Today's TOC FU Call Status:: Unsuccessful Call (3rd Attempt) Unsuccessful Call (3rd Attempt) Date: 03/02/24  Attempted to reach the patient regarding the most recent Inpatient/ED visit.  Follow Up Plan: No further outreach attempts will be made at this time. We have been unable to contact the patient.  Layna Roeper J. Clyde Upshaw RN, MSN [facility] RN Care Manager Direct Dial: [phone]  Fax: [phone] Website: [email or web address]

## 2024-03-17 ENCOUNTER — Encounter: Payer: Self-pay | Admitting: Nurse Practitioner

## 2024-03-17 ENCOUNTER — Ambulatory Visit (INDEPENDENT_AMBULATORY_CARE_PROVIDER_SITE_OTHER): Admitting: Nurse Practitioner

## 2024-03-17 VITALS — BP 130/70 | HR 62 | Temp 97.2°F | Ht 73.0 in | Wt 206.0 lb

## 2024-03-17 DIAGNOSIS — R7989 Other specified abnormal findings of blood chemistry: Secondary | ICD-10-CM

## 2024-03-17 DIAGNOSIS — Z8619 Personal history of other infectious and parasitic diseases: Secondary | ICD-10-CM | POA: Diagnosis not present

## 2024-03-17 DIAGNOSIS — E119 Type 2 diabetes mellitus without complications: Secondary | ICD-10-CM | POA: Diagnosis not present

## 2024-03-17 DIAGNOSIS — Z125 Encounter for screening for malignant neoplasm of prostate: Secondary | ICD-10-CM | POA: Diagnosis not present

## 2024-03-17 DIAGNOSIS — E785 Hyperlipidemia, unspecified: Secondary | ICD-10-CM | POA: Diagnosis not present

## 2024-03-17 DIAGNOSIS — I10 Essential (primary) hypertension: Secondary | ICD-10-CM

## 2024-03-17 DIAGNOSIS — E1169 Type 2 diabetes mellitus with other specified complication: Secondary | ICD-10-CM | POA: Diagnosis not present

## 2024-03-17 DIAGNOSIS — Z7984 Long term (current) use of oral hypoglycemic drugs: Secondary | ICD-10-CM

## 2024-03-17 DIAGNOSIS — Z0001 Encounter for general adult medical examination with abnormal findings: Secondary | ICD-10-CM | POA: Diagnosis not present

## 2024-03-17 DIAGNOSIS — E538 Deficiency of other specified B group vitamins: Secondary | ICD-10-CM | POA: Diagnosis not present

## 2024-03-17 LAB — LIPID PANEL

## 2024-03-17 LAB — BAYER DCA HB A1C WAIVED: HB A1C (BAYER DCA - WAIVED): 7.5 % — ABNORMAL HIGH (ref 4.8–5.6)

## 2024-03-17 MED ORDER — AMLODIPINE BESYLATE 10 MG PO TABS: 10.0000 mg | ORAL_TABLET | Freq: Every day | ORAL | 1 refills | Status: DC

## 2024-03-17 MED ORDER — ROSUVASTATIN CALCIUM 10 MG PO TABS: 10.0000 mg | ORAL_TABLET | Freq: Every evening | ORAL | 1 refills | Status: DC

## 2024-03-17 MED ORDER — HYDROCHLOROTHIAZIDE 25 MG PO TABS: 25.0000 mg | ORAL_TABLET | Freq: Every day | ORAL | 1 refills | Status: DC

## 2024-03-17 MED ORDER — PANTOPRAZOLE SODIUM 40 MG PO TBEC: 40.0000 mg | DELAYED_RELEASE_TABLET | Freq: Two times a day (BID) | ORAL | 6 refills | Status: DC

## 2024-03-17 MED ORDER — METFORMIN HCL 1000 MG PO TABS: 1000.0000 mg | ORAL_TABLET | Freq: Two times a day (BID) | ORAL | 1 refills | Status: DC

## 2024-03-17 NOTE — Patient Instructions (Signed)
Cooking With Less Salt Cooking with less salt is one way to reduce the amount of salt (sodium) you get from food. Most people should have less than 2,300 milligrams (mg) of sodium each day. If you have high blood pressure (hypertension), you may need to limit your sodium to 1,500 mg each day. Follow the tips below to help reduce your sodium intake. What are tips for eating less sodium? Reading food labels  Check the food label before buying or using packaged ingredients. Always check the label for the serving size and sodium content. Choose products with less than 140 mg of sodium per serving. Check the % Daily Value column to see what percent of the daily recommended amount of sodium is in one serving of the product. Foods with 5% or less are low in sodium. Foods with 20% or more are high in sodium. Do not choose foods that have salt as one of the first three ingredients on the ingredients list. Always check how much sodium is in a product, even if the label says "unsalted" or "no salt added." Shopping Buy sodium-free or low-sodium products. Look for these words: Low-sodium. Sodium-free. Reduced-sodium. No salt added. Unsalted. Buy fresh or frozen foods without sauces or additives. Cooking Instead of salt, use herbs, seasonings without salt, and spices. Use sodium-free baking soda. Grill, braise, or roast foods to add flavor with less salt. Do not add salt to pasta, rice, or hot cereals. Drain and rinse canned vegetables, beans, and meat before use. Do not add salt when cooking sweets and desserts. Cook with low-sodium ingredients. Meal planning The sodium in bread can add up. Try to plan meals with other grains. These may include whole oats, quinoa, whole wheat pasta, and other whole grains that do not have sodium added to them. What foods are high in sodium? Vegetables Regular canned vegetables, except low-sodium or reduced-sodium items. Sauerkraut, pickled vegetables, and relishes.  Olives. French fries. Onion rings. Regular canned tomato sauce and paste. Regular tomato and vegetable juice. Frozen vegetables in sauces. Grains Instant hot cereals. Bread stuffing, pancake, and biscuit mixes. Croutons. Seasoned rice or pasta mixes. Noodle soup cups. Boxed or frozen macaroni and cheese. Regular salted crackers. Self-rising flour. Rolls. Bagels. Flour tortillas and wraps. Meats and other proteins Meat or fish that is salted, canned, smoked, cured, spiced, or pickled. Precooked or cured meat, such as sausages or meat loaves. Bacon. Ham. Pepperoni. Hot dogs. Corned beef. Chipped beef. Salt pork. Jerky. Pickled herring, anchovies, and sardines. Regular canned tuna. Salted nuts. Dairy Processed cheese and cheese spreads. Hard cheeses. Cheese curds. Blue cheese. Feta cheese. String cheese. Regular cottage cheese. Buttermilk. Canned milk. The items listed above may not be a full list of foods high in sodium. Talk to a dietitian to learn more. What foods are low in sodium? Fruits Fresh, frozen, or canned fruit with no sauce added. Fruit juice. Vegetables Fresh or frozen vegetables with no sauce added. "No salt added" canned vegetables. "No salt added" tomato sauce and paste. Low-sodium or reduced-sodium tomato and vegetable juice. Grains Noodles, pasta, quinoa, rice. Shredded or puffed wheat or puffed rice. Regular or quick oats (not instant). Low-sodium crackers. Low-sodium bread. Whole grain bread and whole grain pasta. Unsalted popcorn. Meats and other proteins Fresh or frozen whole meats, poultry that has not been injected with sodium, and fish with no sauce added. Unsalted nuts. Dried peas, beans, and lentils without added salt. Unsalted canned beans. Eggs. Unsalted nut butters. Low-sodium canned tuna or chicken. Dairy   Milk. Soy milk. Yogurt. Low-sodium cheeses, such as Swiss, Monterey Jack, mozzarella, and ricotta. Sherbet or ice cream (keep to  cup per serving). Cream  cheese. Fats and oils Unsalted butter or margarine. Other foods Homemade pudding. Sodium-free baking soda and baking powder. Herbs and spices. Low-sodium seasoning mixes. Beverages Coffee and tea. Carbonated beverages. The items listed above may not be a full list of foods low in sodium. Talk to a dietitian to learn more. What are some salt alternatives when cooking? Herbs, seasonings, and spices can be used instead of salt to flavor your food. Herbs should be fresh or dried. Do not choose packaged mixes. Next to the name of the herb, spice, or seasoning below are some foods you can pair it with. Herbs Bay leaves - Soups, meat and vegetable dishes, and spaghetti sauce. Basil - Italian dishes, soups, pasta, and fish dishes. Cilantro - Meat, poultry, and vegetable dishes. Chili powder - Marinades and Mexican dishes. Chives - Salad dressings and potato dishes. Cumin - Mexican dishes, couscous, and meat dishes. Dill - Fish dishes, sauces, and salads. Fennel - Meat and vegetable dishes, breads, and cookies. Garlic (do not use garlic salt) - Italian dishes, meat dishes, salad dressings, and sauces. Marjoram - Soups, potato dishes, and meat dishes. Oregano - Pizza and spaghetti sauce. Parsley - Salads, soups, pasta, and meat dishes. Rosemary - Italian dishes, salad dressings, soups, and red meats. Saffron - Fish dishes, pasta, and some poultry dishes. Sage - Stuffings and sauces. Tarragon - Fish and poultry dishes. Thyme - Stuffing, meat, and fish dishes. Seasonings Lemon juice - Fish dishes, poultry dishes, vegetables, and salads. Vinegar - Salad dressings, vegetables, and fish dishes. Spices Cinnamon - Sweet dishes, such as cakes, cookies, and puddings. Cloves - Gingerbread, puddings, and marinades for meats. Curry - Vegetable dishes, fish and poultry dishes, and stir-fry dishes. Ginger - Vegetable dishes, fish dishes, and stir-fry dishes. Nutmeg - Pasta, vegetables, poultry, fish  dishes, and custard. This information is not intended to replace advice given to you by your health care provider. Make sure you discuss any questions you have with your health care provider. Document Revised: 08/21/2022 Document Reviewed: 08/14/2022 Elsevier Patient Education  2024 [employer].  

## 2024-03-17 NOTE — Progress Notes (Signed)
 Subjective:    Patient ID: Sahand Gosch, male    DOB: 05/07/1946, 78 y.o.   MRN: [id number]   Chief Complaint:.medical management of chronic issues       HPI:  Nigil Braman is a 78 y.o. who identifies as a male who was assigned male at birth.   Social history: Lives with: by hisself Work history: retired   Water engineer in today for follow up of the following chronic medical issues:  1. Primary hypertension No c/o chest pain, sob or headache. Does not check blood pressure at home. BP Readings from Last 3 Encounters:  02/26/24 (!) 153/74  02/24/24 114/70  01/07/24 136/62     2. Diabetes mellitus treated with oral medication (HCC) Very seldom checks his blood sugars Lab Results  Component Value Date   HGBA1C 7.5 (H) 12/10/2023     3. Hyperlipidemia associated with type 2 diabetes mellitus (HCC) Does not watch diet and does no exercise. Lab Results  Component Value Date   CHOL 121 12/10/2023   HDL 44 12/10/2023   LDLCALC 57 12/10/2023   TRIG 107 12/10/2023   CHOLHDL 2.8 12/10/2023   The ASCVD Risk score (Arnett DK, et al., 2019) failed to calculate for the following reasons:   The valid total cholesterol range is 130 to 320 mg/dL   4. Gastroesophageal reflux disease without esophagitis Is on omperazole and is doing well.  5. BMI 29.0-29.9,adult Weight is up 6lbs  Wt Readings from Last 3 Encounters:  02/24/24 205 lb (93 kg)  02/24/24 205 lb 9.6 oz (93.3 kg)  01/07/24 208 lb (94.3 kg)   BMI Readings from Last 3 Encounters:  02/24/24 27.05 kg/m  02/24/24 27.13 kg/m  01/07/24 27.44 kg/m       New complaints: Patient went to the ED on 02/24/24 and was diagnosed with sepsis secondary to UTI. He was admitted to the hospital and treated with IV antibiotics.  No Known Allergies Outpatient Encounter Medications as of 03/17/2024  Medication Sig   amLODipine  (NORVASC ) 10 MG tablet Take 1 tablet (10 mg total) by mouth daily.   aspirin  EC 81 MG tablet Take  81 mg by mouth at bedtime.   fish oil-omega-3 fatty acids  1000 MG capsule Take 1 g by mouth 2 (two) times daily.   hydrochlorothiazide  (HYDRODIURIL ) 25 MG tablet Take 1 tablet (25 mg total) by mouth daily.   metFORMIN  (GLUCOPHAGE ) 1000 MG tablet Take 1 tablet (1,000 mg total) by mouth 2 (two) times daily with a meal. (NEEDS TO BE SEEN BEFORE NEXT REFILL)   pantoprazole  (PROTONIX ) 40 MG tablet Take 1 tablet (40 mg total) by mouth 2 (two) times daily before a meal.   rosuvastatin  (CRESTOR ) 10 MG tablet Take 1 tablet (10 mg total) by mouth at bedtime.   Facility-Administered Encounter Medications as of 03/17/2024  Medication   cyanocobalamin  (VITAMIN B12) injection 1,000 mcg    Past Surgical History:  Procedure Laterality Date   BILIARY STENT PLACEMENT  11/02/2023   Procedure: INSERTION, STENT, BILE DUCT;  Surgeon: Wilhelmenia Aloha Raddle., MD;  Location: [facility] ENDOSCOPY;  Service: Gastroenterology;;   BIOPSY OF SKIN SUBCUTANEOUS TISSUE AND/OR MUCOUS MEMBRANE  10/28/2023   Procedure: BIOPSY;  Surgeon: Avram Lupita BRAVO, MD;  Location: [facility] ENDOSCOPY;  Service: Gastroenterology;;   CATARACT EXTRACTION, BILATERAL Bilateral    CHOLECYSTECTOMY N/A 10/29/2023   Procedure: LAPAROSCOPIC CHOLECYSTECTOMY WITH INTRAOPERATIVE CHOLANGIOGRAM;  Surgeon: Stevie Herlene Righter, MD;  Location: [facility] OR;  Service: General;  Laterality: N/A;   COLONOSCOPY  12/01/2013  COLONOSCOPY  2020   MS-MAC-suprep(good)-tics/hems/HPPx1/TAx4   COLONOSCOPY  04/22/2022   ERCP N/A 10/28/2023   Procedure: ERCP, WITH INTERVENTION IF INDICATED;  Surgeon: Avram Lupita BRAVO, MD;  Location: [facility] ENDOSCOPY;  Service: Gastroenterology;  Laterality: N/A;   ERCP N/A 11/02/2023   Procedure: ERCP, WITH INTERVENTION IF INDICATED;  Surgeon: Wilhelmenia Aloha Raddle., MD;  Location: [facility] ENDOSCOPY;  Service: Gastroenterology;  Laterality: N/A;   ERCP N/A 01/07/2024   Procedure: ERCP, WITH INTERVENTION IF INDICATED;  Surgeon: Wilhelmenia Aloha Raddle., MD;  Location:  [facility] ENDOSCOPY;  Service: Gastroenterology;  Laterality: N/A;   ESOPHAGOGASTRODUODENOSCOPY N/A 10/28/2023   Procedure: EGD (ESOPHAGOGASTRODUODENOSCOPY);  Surgeon: Avram Lupita BRAVO, MD;  Location: [facility] ENDOSCOPY;  Service: Gastroenterology;  Laterality: N/A;   ESOPHAGOGASTRODUODENOSCOPY N/A 01/07/2024   Procedure: EGD (ESOPHAGOGASTRODUODENOSCOPY);  Surgeon: Wilhelmenia Aloha Raddle., MD;  Location: [facility];  Service: Gastroenterology;  Laterality: N/A;   FOREIGN BODY REMOVAL  01/07/2024   Procedure: REMOVAL, FOREIGN BODY;  Surgeon: Wilhelmenia, Aloha Raddle., MD;  Location: [facility] ENDOSCOPY;  Service: Gastroenterology;;   HEMORRHOID SURGERY  1978   PANCREATIC STENT PLACEMENT  10/28/2023   Procedure: INSERTION, STENT, PANCREATIC DUCT;  Surgeon: Avram Lupita BRAVO, MD;  Location: [facility] ENDOSCOPY;  Service: Gastroenterology;;   POLYPECTOMY  2020   HPPx1/TAx4   STENT REMOVAL  01/07/2024   Procedure: STENT REMOVAL;  Surgeon: Wilhelmenia Aloha Raddle., MD;  Location: [facility];  Service: Gastroenterology;;   STONE EXTRACTION WITH BASKET  11/02/2023   Procedure: ERCP, WITH LITHROTRIPSY OR REMOVAL OF COMMON BILE DUCT CALCULUS;  Surgeon: Mansouraty, Aloha Raddle., MD;  Location: [facility] ENDOSCOPY;  Service: Gastroenterology;;   UPPER GASTROINTESTINAL ENDOSCOPY     WISDOM TOOTH EXTRACTION      Family History  Problem Relation Age of Onset   Diabetes Mother    Hypertension Mother    Stroke Mother 25   Diabetes Father    Hypertension Father    Heart attack Father 46   Colon cancer Neg Hx    Colon polyps Neg Hx    Esophageal cancer Neg Hx    Stomach cancer Neg Hx    Rectal cancer Neg Hx       Controlled substance contract: n/a     Review of Systems  Constitutional:  Negative for diaphoresis.  Eyes:  Negative for pain.  Respiratory:  Negative for shortness of breath.   Cardiovascular:  Negative for chest pain, palpitations and leg swelling.  Gastrointestinal:  Negative for abdominal pain.  Endocrine: Negative  for polydipsia.  Skin:  Negative for rash.  Neurological:  Negative for dizziness, weakness and headaches.  Hematological:  Does not bruise/bleed easily.  All other systems reviewed and are negative.      Objective:   Physical Exam Vitals and nursing note reviewed.  Constitutional:      Appearance: Normal appearance. He is well-developed.  HENT:     Head: Normocephalic.     Nose: Nose normal.     Mouth/Throat:     Mouth: Mucous membranes are moist.     Pharynx: Oropharynx is clear.  Eyes:     Pupils: Pupils are equal, round, and reactive to light.  Neck:     Thyroid : No thyroid  mass or thyromegaly.     Vascular: No carotid bruit or JVD.     Trachea: Phonation normal.  Cardiovascular:     Rate and Rhythm: Normal rate and regular rhythm.  Pulmonary:     Effort: Pulmonary effort is normal. No respiratory distress.     Breath  sounds: Normal breath sounds.  Abdominal:     General: Bowel sounds are normal.     Palpations: Abdomen is soft.     Tenderness: There is no abdominal tenderness.  Musculoskeletal:        General: Normal range of motion.     Cervical back: Normal range of motion and neck supple.  Lymphadenopathy:     Cervical: No cervical adenopathy.  Skin:    General: Skin is warm and dry.  Neurological:     Mental Status: He is alert and oriented to person, place, and time.  Psychiatric:        Behavior: Behavior normal.        Thought Content: Thought content normal.        Judgment: Judgment normal.    BP 130/70   Pulse 62   Temp (!) 97.2 F (36.2 C) (Temporal)   Ht 6' 1 (1.854 m)   Wt 206 lb (93.4 kg)   SpO2 93%   BMI 27.18 kg/m    Hgba1c 7.5%     Assessment & Plan:   Wilver Tignor comes in today with chief complaint of annual physical  Diagnosis and orders addressed:  1. Primary hypertension Low sodium diet - CBC with Differential/Platelet - CMP14+EGFR - amLODipine  (NORVASC ) 10 MG tablet; Take 1 tablet (10 mg total) by mouth daily.   Dispense: 90 tablet; Refill: 1 - benazepril  (LOTENSIN ) 40 MG tablet; Take 1 tablet (40 mg total) by mouth daily.  Dispense: 90 tablet; Refill: 1 - hydrochlorothiazide  (HYDRODIURIL ) 25 MG tablet; Take 1 tablet (25 mg total) by mouth daily.  Dispense: 90 tablet; Refill: 1  2. Diabetes mellitus treated with oral medication (HCC) Continue to watch carbs in diet - Bayer DCA Hb A1c Waived - Microalbumin / creatinine urine ratio - metFORMIN  (GLUCOPHAGE ) 1000 MG tablet; Take 1 tablet (1,000 mg total) by mouth 2 (two) times daily with a meal. (NEEDS TO BE SEEN BEFORE NEXT REFILL)  Dispense: 180 tablet; Refill: 1  3. Hyperlipidemia associated with type 2 diabetes mellitus (HCC) Low fat diet - Lipid panel - rosuvastatin  (CRESTOR ) 10 MG tablet; Take 1 tablet (10 mg total) by mouth daily.  Dispense: 90 tablet; Refill: 1  4. Gastroesophageal reflux disease without esophagitis Avoid spicy foods Do not eat 2 hours prior to bedtime - omeprazole  (PRILOSEC) 40 MG capsule; Take 1 capsule (40 mg total) by mouth daily.  Dispense: 90 capsule; Refill: 1  5. BMI 29.0-29.9,adult Discussed diet and exercise for person with BMI >25 Will recheck weight in 3-6 months  6. Hx sepsis secondary to UTI [facility] records reviewed  Labs pending Health Maintenance reviewed Diet and exercise encouraged  Follow up plan: 6 months   Mary-Margaret Lunger, FNP

## 2024-03-18 ENCOUNTER — Ambulatory Visit: Payer: Self-pay | Admitting: Nurse Practitioner

## 2024-03-18 LAB — CBC WITH DIFFERENTIAL/PLATELET
Basophils Absolute: 0.1 x10E3/uL (ref 0.0–0.2)
Basos: 1 %
EOS (ABSOLUTE): 0.3 x10E3/uL (ref 0.0–0.4)
Eos: 5 %
Hematocrit: 39.1 % (ref 37.5–51.0)
Hemoglobin: 12.5 g/dL — ABNORMAL LOW (ref 13.0–17.7)
Immature Grans (Abs): 0 x10E3/uL (ref 0.0–0.1)
Immature Granulocytes: 0 %
Lymphocytes Absolute: 1.8 x10E3/uL (ref 0.7–3.1)
Lymphs: 28 %
MCH: 27 pg (ref 26.6–33.0)
MCHC: 32 g/dL (ref 31.5–35.7)
MCV: 84 fL (ref 79–97)
Monocytes Absolute: 0.5 x10E3/uL (ref 0.1–0.9)
Monocytes: 8 %
Neutrophils Absolute: 3.8 x10E3/uL (ref 1.4–7.0)
Neutrophils: 58 %
Platelets: 248 x10E3/uL (ref 150–450)
RBC: 4.63 x10E6/uL (ref 4.14–5.80)
RDW: 14.3 % (ref 11.6–15.4)
WBC: 6.6 x10E3/uL (ref 3.4–10.8)

## 2024-03-18 LAB — LIPID PANEL
Cholesterol, Total: 111 mg/dL (ref 100–199)
HDL: 50 mg/dL (ref >39)
LDL CALC COMMENT:: 2.2 ratio (ref 0.0–5.0)
LDL Chol Calc (NIH): 48 mg/dL (ref 0–99)
Triglycerides: 59 mg/dL (ref 0–149)
VLDL Cholesterol Cal: 13 mg/dL (ref 5–40)

## 2024-03-18 LAB — CMP14+EGFR
ALT: 19 IU/L (ref 0–44)
AST: 24 IU/L (ref 0–40)
Albumin: 4.6 g/dL (ref 3.8–4.8)
Alkaline Phosphatase: 83 IU/L (ref 44–121)
BUN/Creatinine Ratio: 15 (ref 10–24)
BUN: 16 mg/dL (ref 8–27)
Bilirubin Total: 1.2 mg/dL (ref 0.0–1.2)
CO2: 20 mmol/L (ref 20–29)
Calcium: 10.6 mg/dL — AB (ref 8.6–10.2)
Chloride: 105 mmol/L (ref 96–106)
Creatinine, Ser: 1.07 mg/dL (ref 0.76–1.27)
Globulin, Total: 2.5 g/dL (ref 1.5–4.5)
Glucose: 170 mg/dL — AB (ref 70–99)
Potassium: 5.3 mmol/L — AB (ref 3.5–5.2)
Sodium: 143 mmol/L (ref 134–144)
Total Protein: 7.1 g/dL (ref 6.0–8.5)
eGFR: 72 mL/min/1.73 (ref >59)

## 2024-03-18 LAB — PSA, TOTAL AND FREE
PSA, Free Pct: 36
PSA, Free: 1.08 ng/mL
Prostate Specific Ag, Serum: 3 ng/mL (ref 0.0–4.0)

## 2024-03-18 LAB — VITAMIN B12: Vitamin B-12: 426 pg/mL (ref 232–1245)

## 2024-03-29 ENCOUNTER — Ambulatory Visit: Payer: Self-pay | Admitting: Nurse Practitioner

## 2024-04-05 DIAGNOSIS — L84 Corns and callosities: Secondary | ICD-10-CM | POA: Diagnosis not present

## 2024-04-05 DIAGNOSIS — B351 Tinea unguium: Secondary | ICD-10-CM | POA: Diagnosis not present

## 2024-04-05 DIAGNOSIS — E1142 Type 2 diabetes mellitus with diabetic polyneuropathy: Secondary | ICD-10-CM | POA: Diagnosis not present

## 2024-04-05 DIAGNOSIS — M79675 Pain in left toe(s): Secondary | ICD-10-CM | POA: Diagnosis not present

## 2024-04-05 DIAGNOSIS — M79674 Pain in right toe(s): Secondary | ICD-10-CM | POA: Diagnosis not present

## 2024-04-12 ENCOUNTER — Ambulatory Visit: Admitting: *Deleted

## 2024-04-12 DIAGNOSIS — E538 Deficiency of other specified B group vitamins: Secondary | ICD-10-CM

## 2024-04-12 MED ORDER — CYANOCOBALAMIN 1000 MCG/ML IJ SOLN
1000.0000 ug | INTRAMUSCULAR | Status: AC
  Administered 2024-04-12 – 2024-08-29 (×5): 1000 ug via INTRAMUSCULAR

## 2024-04-12 NOTE — Progress Notes (Signed)
 Patient is in office today for a nurse visit for B12 Injection. Patient Injection was given in the  Left deltoid. Patient tolerated injection well.

## 2024-05-03 ENCOUNTER — Encounter: Payer: Self-pay | Admitting: Gastroenterology

## 2024-05-13 ENCOUNTER — Ambulatory Visit: Admitting: *Deleted

## 2024-05-13 DIAGNOSIS — Z23 Encounter for immunization: Secondary | ICD-10-CM

## 2024-05-13 DIAGNOSIS — E538 Deficiency of other specified B group vitamins: Secondary | ICD-10-CM

## 2024-05-13 NOTE — Progress Notes (Signed)
 Patient is in office today for a nurse visit for B12 Injection. Patient Injection was given in the  Left deltoid. Patient tolerated injection well.

## 2024-05-30 ENCOUNTER — Encounter: Payer: Self-pay | Admitting: Gastroenterology

## 2024-06-13 ENCOUNTER — Ambulatory Visit: Payer: Self-pay

## 2024-06-15 ENCOUNTER — Ambulatory Visit (INDEPENDENT_AMBULATORY_CARE_PROVIDER_SITE_OTHER): Admitting: *Deleted

## 2024-06-15 DIAGNOSIS — E538 Deficiency of other specified B group vitamins: Secondary | ICD-10-CM | POA: Diagnosis not present

## 2024-06-15 NOTE — Progress Notes (Signed)
 Patient is in office today for a nurse visit for B12 Injection.  Injection was given in the  Right deltoid. Patient tolerated injection well.

## 2024-06-28 DIAGNOSIS — B351 Tinea unguium: Secondary | ICD-10-CM | POA: Diagnosis not present

## 2024-06-28 DIAGNOSIS — M79674 Pain in right toe(s): Secondary | ICD-10-CM | POA: Diagnosis not present

## 2024-06-28 DIAGNOSIS — L84 Corns and callosities: Secondary | ICD-10-CM | POA: Diagnosis not present

## 2024-06-28 DIAGNOSIS — E1142 Type 2 diabetes mellitus with diabetic polyneuropathy: Secondary | ICD-10-CM | POA: Diagnosis not present

## 2024-06-28 DIAGNOSIS — M79675 Pain in left toe(s): Secondary | ICD-10-CM | POA: Diagnosis not present

## 2024-07-09 ENCOUNTER — Encounter: Payer: Self-pay | Admitting: Gastroenterology

## 2024-07-15 ENCOUNTER — Ambulatory Visit: Admitting: *Deleted

## 2024-07-15 DIAGNOSIS — E538 Deficiency of other specified B group vitamins: Secondary | ICD-10-CM | POA: Diagnosis not present

## 2024-08-17 ENCOUNTER — Other Ambulatory Visit: Payer: Self-pay | Admitting: Nurse Practitioner

## 2024-08-19 ENCOUNTER — Other Ambulatory Visit: Payer: Self-pay | Admitting: *Deleted

## 2024-08-19 ENCOUNTER — Ambulatory Visit

## 2024-08-19 ENCOUNTER — Other Ambulatory Visit

## 2024-08-19 VITALS — Ht 73.0 in | Wt 225.4 lb

## 2024-08-19 DIAGNOSIS — K269 Duodenal ulcer, unspecified as acute or chronic, without hemorrhage or perforation: Secondary | ICD-10-CM

## 2024-08-19 MED ORDER — ACCU-CHEK GUIDE TEST VI STRP: ORAL_STRIP | 3 refills | Status: AC

## 2024-08-19 NOTE — Progress Notes (Signed)

## 2024-08-24 ENCOUNTER — Encounter: Payer: Self-pay | Admitting: Gastroenterology

## 2024-08-26 ENCOUNTER — Encounter: Payer: Self-pay | Admitting: Gastroenterology

## 2024-08-29 ENCOUNTER — Ambulatory Visit (INDEPENDENT_AMBULATORY_CARE_PROVIDER_SITE_OTHER): Admitting: *Deleted

## 2024-08-29 DIAGNOSIS — E538 Deficiency of other specified B group vitamins: Secondary | ICD-10-CM

## 2024-08-29 NOTE — Progress Notes (Signed)
Pt given B12 injection IM right deltoid and tolerated well. 

## 2024-09-02 ENCOUNTER — Encounter: Payer: Self-pay | Admitting: Gastroenterology

## 2024-09-02 ENCOUNTER — Ambulatory Visit: Admitting: Gastroenterology

## 2024-09-02 VITALS — BP 117/54 | HR 57 | Temp 98.1°F | Resp 23 | Ht 73.0 in | Wt 225.4 lb

## 2024-09-02 DIAGNOSIS — K449 Diaphragmatic hernia without obstruction or gangrene: Secondary | ICD-10-CM | POA: Diagnosis not present

## 2024-09-02 DIAGNOSIS — K3189 Other diseases of stomach and duodenum: Secondary | ICD-10-CM

## 2024-09-02 DIAGNOSIS — K2970 Gastritis, unspecified, without bleeding: Secondary | ICD-10-CM

## 2024-09-02 DIAGNOSIS — K2950 Unspecified chronic gastritis without bleeding: Secondary | ICD-10-CM | POA: Diagnosis not present

## 2024-09-02 DIAGNOSIS — Z8719 Personal history of other diseases of the digestive system: Secondary | ICD-10-CM

## 2024-09-02 MED ORDER — SODIUM CHLORIDE 0.9 % IV SOLN: 500.0000 mL | INTRAVENOUS | Status: DC

## 2024-09-02 NOTE — Op Note (Signed)
 [facility] Patient Name: Jorge Garcia Procedure Date: 09/02/2024 10:07 AM MRN: [id number] Endoscopist: Aloha Finner , MD, [id number] Age: 79 Referring MD:  Date of Birth: Mar 10, 1946 Gender: Male Account #: [id number] Procedure:                Upper GI endoscopy Indications:              Follow-up of duodenal ulcer Medicines:                Monitored Anesthesia Care Procedure:                Pre-Anesthesia Assessment:                           - Prior to the procedure, a History and Physical                            was performed, and patient medications and                            allergies were reviewed. The patient's tolerance of                            previous anesthesia was also reviewed. The risks                            and benefits of the procedure and the sedation                            options and risks were discussed with the patient.                            All questions were answered, and informed consent                            was obtained. Prior Anticoagulants: The patient has                            taken no anticoagulant or antiplatelet agents                            except for aspirin . ASA Grade Assessment: II - A                            patient with mild systemic disease. After reviewing                            the risks and benefits, the patient was deemed in                            satisfactory condition to undergo the procedure.                           After obtaining informed consent, the endoscope was  passed under direct vision. Throughout the                            procedure, the patient's blood pressure, pulse, and                            oxygen saturations were monitored continuously. The                            Olympus scope [id number] was introduced through the                            mouth, and advanced to the second part of duodenum.                             The upper GI endoscopy was accomplished without                            difficulty. The patient tolerated the procedure. Scope In: Scope Out: Findings:                 The examined esophagus was mildly tortuous.                           No gross mucosal lesions were noted in the entire                            esophagus.                           The Z-line was irregular and was found 42 cm from                            the incisors.                           A J-shaped deformity was found of the stomach.                           Patchy mildly erythematous mucosa without bleeding                            was found in the gastric antrum.                           No other gross lesions were noted in the entire                            examined stomach. Biopsies were taken with a cold                            forceps for histology and Helicobacter pylori                            testing.  A single 8 mm subepithelial nodule was found in the                            duodenal bulb. Biopsies were taken with a cold                            forceps for histology to rule out NET.                           No other gross lesions were noted in the duodenal                            bulb, in the first portion of the duodenum and in                            the second portion of the duodenum. Complications:            No immediate complications. Estimated Blood Loss:     Estimated blood loss was minimal. Impression:               - Tortuous esophagus. No gross mucosal lesions in                            the entire esophagus.                           - Z-line irregular, 42 cm from the incisors.                           - J-shaped gastric deformity.                           - Erythematous mucosa in the antrum. No other gross                            lesions in the entire stomach. Biopsied.                           - Subepithelial nodule found in  the duodenum bulb -                            biopsied. No other gross lesions in the duodenal                            bulb, in the first portion of the duodenum and in                            the second portion of the duodenum. Recommendation:           - The patient will be observed post-procedure,                            until all discharge criteria are met.                           -  Discharge patient to home.                           - Patient has a contact number available for                            emergencies. The signs and symptoms of potential                            delayed complications were discussed with the                            patient. Return to normal activities tomorrow.                            Written discharge instructions were provided to the                            patient.                           - Resume previous diet.                           - Continue present medications.                           - Await pathology results.                           - The findings and recommendations were discussed                            with the patient.                           - The findings and recommendations were discussed                            with the designated responsible adult. Aloha Finner, MD 09/02/2024 10:28:07 AM

## 2024-09-02 NOTE — Patient Instructions (Addendum)
-  Await pathology results -Resume previous diet  YOU HAD AN ENDOSCOPIC PROCEDURE TODAY AT THE [facility]:   Refer to the procedure report that was given to you for any specific questions about what was found during the examination.  If the procedure report does not answer your questions, please call your gastroenterologist to clarify.  If you requested that your care partner not be given the details of your procedure findings, then the procedure report has been included in a sealed envelope for you to review at your convenience later.  YOU SHOULD EXPECT: Some feelings of bloating in the abdomen. Passage of more gas than usual.  Walking can help get rid of the air that was put into your GI tract during the procedure and reduce the bloating. If you had a lower endoscopy (such as a colonoscopy or flexible sigmoidoscopy) you may notice spotting of blood in your stool or on the toilet paper. If you underwent a bowel prep for your procedure, you may not have a normal bowel movement for a few days.  Please Note:  You might notice some irritation and congestion in your nose or some drainage.  This is from the oxygen used during your procedure.  There is no need for concern and it should clear up in a day or so.  SYMPTOMS TO REPORT IMMEDIATELY:  Following upper endoscopy (EGD)  Vomiting of blood or coffee ground material  New chest pain or pain under the shoulder blades  Painful or persistently difficult swallowing  New shortness of breath  Fever of 100F or higher  Black, tarry-looking stools  For urgent or emergent issues, a gastroenterologist can be reached at any hour by calling [phone]. Do not use MyChart messaging for urgent concerns.    DIET:  We do recommend a small meal at first, but then you may proceed to your regular diet.  Drink plenty of fluids but you should avoid alcoholic beverages for 24 hours.  ACTIVITY:  You should plan to take it easy for the rest of today and  you should NOT DRIVE or use heavy machinery until tomorrow (because of the sedation medicines used during the test).    FOLLOW UP: Our staff will call the number listed on your records the next business day following your procedure.  We will call around 7:15- 8:00 am to check on you and address any questions or concerns that you may have regarding the information given to you following your procedure. If we do not reach you, we will leave a message.     If any biopsies were taken you will be contacted by phone or by letter within the next 1-3 weeks.  Please call us  at [phone] if you have not heard about the biopsies in 3 weeks.    SIGNATURES/CONFIDENTIALITY: You and/or your care partner have signed paperwork which will be entered into your electronic medical record.  These signatures attest to the fact that that the information above on your After Visit Summary has been reviewed and is understood.  Full responsibility of the confidentiality of this discharge information lies with you and/or your care-partner.

## 2024-09-02 NOTE — Progress Notes (Signed)
1017 Robinul 0.1 mg IV given due large amount of secretions upon assessment.  MD made aware, vss  

## 2024-09-02 NOTE — Progress Notes (Signed)
 Report given to PACU, vss

## 2024-09-02 NOTE — Progress Notes (Signed)
 "  GASTROENTEROLOGY PROCEDURE H&P NOTE   Primary Care Physician: Gladis Mustard, FNP  HPI: Jorge Garcia is a 79 y.o. male who presents for EGD for duodenal ulcer followup.  Past Medical History:  Diagnosis Date   Arthritis    back   Cataract    bilateral sx   Diabetes mellitus without complication (HCC)    on meds   Difficult airway for intubation 01/07/2024   Difficult airway - due to anterior larynx; Grade 3;  MAC, 4; Oral; 7.5 mm; Cuffed,  Stylet;   GERD (gastroesophageal reflux disease)    on meds   Hyperlipidemia    on meds   Hypertension    on meds   Internal hemorrhoids    Tubulovillous adenoma of colon 2009   Vitamin D  deficiency    Past Surgical History:  Procedure Laterality Date   BILIARY STENT PLACEMENT  11/02/2023   Procedure: INSERTION, STENT, BILE DUCT;  Surgeon: Wilhelmenia Aloha Raddle., MD;  Location: [facility] ENDOSCOPY;  Service: Gastroenterology;;   BIOPSY OF SKIN SUBCUTANEOUS TISSUE AND/OR MUCOUS MEMBRANE  10/28/2023   Procedure: BIOPSY;  Surgeon: Avram Lupita BRAVO, MD;  Location: [facility] ENDOSCOPY;  Service: Gastroenterology;;   CATARACT EXTRACTION, BILATERAL Bilateral    CHOLECYSTECTOMY N/A 10/29/2023   Procedure: LAPAROSCOPIC CHOLECYSTECTOMY WITH INTRAOPERATIVE CHOLANGIOGRAM;  Surgeon: Stevie Herlene Righter, MD;  Location: [facility] OR;  Service: General;  Laterality: N/A;   COLONOSCOPY  12/01/2013   COLONOSCOPY  2020   MS-MAC-suprep(good)-tics/hems/HPPx1/TAx4   COLONOSCOPY  04/22/2022   ERCP N/A 10/28/2023   Procedure: ERCP, WITH INTERVENTION IF INDICATED;  Surgeon: Avram Lupita BRAVO, MD;  Location: [facility] ENDOSCOPY;  Service: Gastroenterology;  Laterality: N/A;   ERCP N/A 11/02/2023   Procedure: ERCP, WITH INTERVENTION IF INDICATED;  Surgeon: Wilhelmenia Aloha Raddle., MD;  Location: [facility] ENDOSCOPY;  Service: Gastroenterology;  Laterality: N/A;   ERCP N/A 01/07/2024   Procedure: ERCP, WITH INTERVENTION IF INDICATED;  Surgeon: Wilhelmenia Aloha Raddle., MD;  Location: [facility]  ENDOSCOPY;  Service: Gastroenterology;  Laterality: N/A;   ESOPHAGOGASTRODUODENOSCOPY N/A 10/28/2023   Procedure: EGD (ESOPHAGOGASTRODUODENOSCOPY);  Surgeon: Avram Lupita BRAVO, MD;  Location: [facility] ENDOSCOPY;  Service: Gastroenterology;  Laterality: N/A;   ESOPHAGOGASTRODUODENOSCOPY N/A 01/07/2024   Procedure: EGD (ESOPHAGOGASTRODUODENOSCOPY);  Surgeon: Wilhelmenia Aloha Raddle., MD;  Location: [facility]OSCOPY;  Service: Gastroenterology;  Laterality: N/A;   FOREIGN BODY REMOVAL  01/07/2024   Procedure: REMOVAL, FOREIGN BODY;  Surgeon: Wilhelmenia, Aloha Raddle., MD;  Location: [facility] ENDOSCOPY;  Service: Gastroenterology;;   HEMORRHOID SURGERY  1978   PANCREATIC STENT PLACEMENT  10/28/2023   Procedure: INSERTION, STENT, PANCREATIC DUCT;  Surgeon: Avram Lupita BRAVO, MD;  Location: [facility] ENDOSCOPY;  Service: Gastroenterology;;   POLYPECTOMY  2020   HPPx1/TAx4   STENT REMOVAL  01/07/2024   Procedure: STENT REMOVAL;  Surgeon: Wilhelmenia Aloha Raddle., MD;  Location: [facility]OSCOPY;  Service: Gastroenterology;;   STONE EXTRACTION WITH BASKET  11/02/2023   Procedure: ERCP, WITH LITHROTRIPSY OR REMOVAL OF COMMON BILE DUCT CALCULUS;  Surgeon: Wilhelmenia Aloha Raddle., MD;  Location: [facility] ENDOSCOPY;  Service: Gastroenterology;;   UPPER GASTROINTESTINAL ENDOSCOPY     WISDOM TOOTH EXTRACTION     Current Outpatient Medications  Medication Sig Dispense Refill   glucose blood (ACCU-CHEK GUIDE TEST) test strip CHECK GLUCOSE 4 TIMES DAILY Dx E11.9 400 each 3   Accu-Chek Softclix Lancets lancets CHECK GLUCOSE 4 TIMES DAILY Dx E11.9 400 each 3   amLODipine  (NORVASC ) 10 MG tablet Take 1 tablet (10 mg total) by mouth daily. 90 tablet 1  aspirin  EC 81 MG tablet Take 81 mg by mouth at bedtime.     fish oil-omega-3 fatty acids  1000 MG capsule Take 1 g by mouth 2 (two) times daily.     hydrochlorothiazide  (HYDRODIURIL ) 25 MG tablet Take 1 tablet (25 mg total) by mouth daily. 90 tablet 1   metFORMIN  (GLUCOPHAGE ) 1000 MG tablet Take 1 tablet  (1,000 mg total) by mouth 2 (two) times daily with a meal. (NEEDS TO BE SEEN BEFORE NEXT REFILL) 180 tablet 1   pantoprazole  (PROTONIX ) 40 MG tablet Take 1 tablet (40 mg total) by mouth 2 (two) times daily before a meal. 60 tablet 6   rosuvastatin  (CRESTOR ) 10 MG tablet Take 1 tablet (10 mg total) by mouth at bedtime. 90 tablet 1   Current Facility-Administered Medications  Medication Dose Route Frequency Provider Last Rate Last Admin   cyanocobalamin  (VITAMIN B12) injection 1,000 mcg  1,000 mcg Intramuscular Q30 days Gladis Mustard, FNP   1,000 mcg at 08/29/24 1417   Current Medications[1] Allergies[2] Family History  Problem Relation Age of Onset   Diabetes Mother    Hypertension Mother    Stroke Mother 15   Diabetes Father    Hypertension Father    Heart attack Father 72   Colon cancer Neg Hx    Colon polyps Neg Hx    Esophageal cancer Neg Hx    Stomach cancer Neg Hx    Rectal cancer Neg Hx    Social History   Socioeconomic History   Marital status: Single    Spouse name: Not on file   Number of children: 0   Years of education: 12   Highest education level: 12th grade  Occupational History   Occupation: Retired    Associate Professor: [employer]  Tobacco Use   Smoking status: Former    Current packs/day: 0.00    Average packs/day: 2.0 packs/day for 40.0 years (80.0 ttl pk-yrs)    Types: Cigarettes    Start date: 11/09/1965    Quit date: 11/09/2005    Years since quitting: 18.8   Smokeless tobacco: Never  Vaping Use   Vaping status: Never Used  Substance and Sexual Activity   Alcohol use: No   Drug use: No   Sexual activity: Not Currently  Other Topics Concern   Not on file  Social History Narrative   Lives alone. No children    [place] benefits   Social Drivers of Health   Tobacco Use: Medium Risk (08/19/2024)   Patient History    Smoking Tobacco Use: Former    Smokeless Tobacco Use: Never    Passive Exposure: Not on file  Financial Resource Strain: Low Risk  (09/07/2023)   Overall Financial Resource Strain (CARDIA)    Difficulty of Paying Living Expenses: Not hard at all  Food Insecurity: No Food Insecurity (02/24/2024)   Epic    Worried About Programme Researcher, Broadcasting/film/video in the Last Year: Never true    Ran Out of Food in the Last Year: Never true  Transportation Needs: No Transportation Needs (02/24/2024)   Epic    Lack of Transportation (Medical): No    Lack of Transportation (Non-Medical): No  Physical Activity: Sufficiently Active (09/07/2023)   Exercise Vital Sign    Days of Exercise per Week: 5 days    Minutes of Exercise per Session: 30 min  Stress: No Stress Concern Present (09/07/2023)   [employer] of Occupational Health - Occupational Stress Questionnaire    Feeling of Stress : Not at  all  Social Connections: Moderately Integrated (02/24/2024)   Social Connection and Isolation Panel    Frequency of Communication with Friends and Family: More than three times a week    Frequency of Social Gatherings with Friends and Family: Three times a week    Attends Religious Services: More than 4 times per year    Active Member of Clubs or Organizations: Yes    Attends Banker Meetings: More than 4 times per year    Marital Status: Divorced  Intimate Partner Violence: Not At Risk (02/24/2024)   Epic    Fear of Current or Ex-Partner: No    Emotionally Abused: No    Physically Abused: No    Sexually Abused: No  Depression (PHQ2-9): Low Risk (03/17/2024)   Depression (PHQ2-9)    PHQ-2 Score: 3  Recent Concern: Depression (PHQ2-9) - High Risk (02/24/2024)   Depression (PHQ2-9)    PHQ-2 Score: 18  Alcohol Screen: Low Risk (09/07/2023)   Alcohol Screen    Last Alcohol Screening Score (AUDIT): 0  Housing: Low Risk (02/24/2024)   Epic    Unable to Pay for Housing in the Last Year: No    Number of Times Moved in the Last Year: 0    Homeless in the Last Year: No  Utilities: Not At Risk (02/24/2024)   Epic    Threatened with loss of  utilities: No  Health Literacy: Adequate Health Literacy (09/07/2023)   B1300 Health Literacy    Frequency of need for help with medical instructions: Never    Physical Exam: There were no vitals filed for this visit. There is no height or weight on file to calculate BMI. GEN: NAD EYE: Sclerae anicteric ENT: MMM CV: Non-tachycardic GI: Soft, NT/ND NEURO:  Alert & Oriented x 3  Lab Results: No results for input(s): WBC, HGB, HCT, PLT in the last 72 hours. BMET No results for input(s): NA, K, CL, CO2, GLUCOSE, BUN, CREATININE, CALCIUM  in the last 72 hours. LFT No results for input(s): PROT, ALBUMIN, AST, ALT, ALKPHOS, BILITOT, BILIDIR, IBILI in the last 72 hours. PT/INR No results for input(s): LABPROT, INR in the last 72 hours.   Impression / Plan: This is a 79 y.o.male who presents for EGD for duodenal ulcer followup.  The risks and benefits of endoscopic evaluation/treatment were discussed with the patient and/or family; these include but are not limited to the risk of perforation, infection, bleeding, missed lesions, lack of diagnosis, severe illness requiring hospitalization, as well as anesthesia and sedation related illnesses.  The patient's history has been reviewed, patient examined, no change in status, and deemed stable for procedure.  The patient and/or family was provided an opportunity to ask questions and all were answered.  The patient and/or family is agreeable to proceed.    Aloha Finner, MD  Gastroenterology Advanced Endoscopy Office # [phone]     [1]  Current Outpatient Medications:    glucose blood (ACCU-CHEK GUIDE TEST) test strip, CHECK GLUCOSE 4 TIMES DAILY Dx E11.9, Disp: 400 each, Rfl: 3   Accu-Chek Softclix Lancets lancets, CHECK GLUCOSE 4 TIMES DAILY Dx E11.9, Disp: 400 each, Rfl: 3   amLODipine  (NORVASC ) 10 MG tablet, Take 1 tablet (10 mg total) by mouth daily., Disp: 90 tablet, Rfl: 1    aspirin  EC 81 MG tablet, Take 81 mg by mouth at bedtime., Disp: , Rfl:    fish oil-omega-3 fatty acids  1000 MG capsule, Take 1 g by mouth 2 (two) times daily., Disp: , Rfl:  hydrochlorothiazide  (HYDRODIURIL ) 25 MG tablet, Take 1 tablet (25 mg total) by mouth daily., Disp: 90 tablet, Rfl: 1   metFORMIN  (GLUCOPHAGE ) 1000 MG tablet, Take 1 tablet (1,000 mg total) by mouth 2 (two) times daily with a meal. (NEEDS TO BE SEEN BEFORE NEXT REFILL), Disp: 180 tablet, Rfl: 1   pantoprazole  (PROTONIX ) 40 MG tablet, Take 1 tablet (40 mg total) by mouth 2 (two) times daily before a meal., Disp: 60 tablet, Rfl: 6   rosuvastatin  (CRESTOR ) 10 MG tablet, Take 1 tablet (10 mg total) by mouth at bedtime., Disp: 90 tablet, Rfl: 1  Current Facility-Administered Medications:    cyanocobalamin  (VITAMIN B12) injection 1,000 mcg, 1,000 mcg, Intramuscular, Q30 days, Gladis, Mary-Margaret, FNP, 1,000 mcg at 08/29/24 1417 [2] No Known Allergies  "

## 2024-09-02 NOTE — Progress Notes (Signed)
 Pt's states no medical or surgical changes since previsit or office visit.

## 2024-09-05 ENCOUNTER — Telehealth: Payer: Self-pay | Admitting: *Deleted

## 2024-09-05 MED ORDER — ACCU-CHEK GUIDE ME W/DEVICE KIT: PACK | 0 refills | Status: AC

## 2024-09-05 NOTE — Telephone Encounter (Signed)
 Ax from [facility] [place], pt needing Accu-Chek guide meter, sent to pharmacy

## 2024-09-06 ENCOUNTER — Telehealth: Payer: Self-pay

## 2024-09-06 NOTE — Telephone Encounter (Signed)
" °  Follow up Call-     09/02/2024    9:25 AM 04/22/2022    9:36 AM  Call back number  Post procedure Call Back phone  # [phone]  Permission to leave phone message Yes Yes     Patient questions:  Do you have a fever, pain , or abdominal swelling? No. Pain Score  0 *  Have you tolerated food without any problems? Yes.    Have you been able to return to your normal activities? Yes.    Do you have any questions about your discharge instructions: Diet   No. Medications  No. Follow up visit  No.  Do you have questions or concerns about your Care? No.  Actions: * If pain score is 4 or above: No action needed, pain <4.   "

## 2024-09-07 ENCOUNTER — Ambulatory Visit: Payer: Self-pay | Admitting: Gastroenterology

## 2024-09-07 ENCOUNTER — Ambulatory Visit: Payer: Medicare HMO

## 2024-09-07 VITALS — BP 130/70 | HR 62 | Ht 73.0 in | Wt 206.0 lb

## 2024-09-07 DIAGNOSIS — Z0000 Encounter for general adult medical examination without abnormal findings: Secondary | ICD-10-CM | POA: Diagnosis not present

## 2024-09-07 LAB — SURGICAL PATHOLOGY

## 2024-09-07 NOTE — Progress Notes (Signed)
 "  Chief Complaint  Patient presents with   Medicare Wellness     Subjective:   Jorge Garcia is a 79 y.o. male who presents for a Medicare Annual Wellness Visit.  Visit info / Clinical Intake: Medicare Wellness Visit Type:: Subsequent Annual Wellness Visit Persons participating in visit and providing information:: patient Medicare Wellness Visit Mode:: Telephone If telephone:: video declined Since this visit was completed virtually, some vitals may be partially provided or unavailable. Missing vitals are due to the limitations of the virtual format.: Unable to obtain vitals - no equipment If Telephone or Video please confirm:: I connected with patient using audio/video enable telemedicine. I verified patient identity with two identifiers, discussed telehealth limitations, and patient agreed to proceed. Patient Location:: home Provider Location:: office Interpreter Needed?: No Pre-visit prep was completed: yes AWV questionnaire completed by patient prior to visit?: no Living arrangements:: (!) lives alone Patient's Overall Health Status Rating: very good Typical amount of pain: none Does pain affect daily life?: no Are you currently prescribed opioids?: no  Dietary Habits and Nutritional Risks How many meals a day?: 3 Eats fruit and vegetables daily?: yes Most meals are obtained by: preparing own meals In the last 2 weeks, have you had any of the following?: none Diabetic:: (!) yes How often do you check your BS?: 3 Would you like to be referred to a Nutritionist or for Diabetic Management? : no  Functional Status Activities of Daily Living (to include ambulation/medication): Independent Ambulation: Independent Medication Administration: Independent Home Management (perform basic housework or laundry): Independent Manage your own finances?: yes Primary transportation is: driving Concerns about vision?: no *vision screening is required for WTM* (last ov 2025) Concerns about  hearing?: (!) yes Uses hearing aids?: (!) yes  Fall Screening Falls in the past year?: 0 Number of falls in past year: 0 Was there an injury with Fall?: 0 Fall Risk Category Calculator: 0 Patient Fall Risk Level: Low Fall Risk  Fall Risk Patient at Risk for Falls Due to: No Fall Risks Fall risk Follow up: Falls evaluation completed; Education provided  Home and Transportation Safety: All rugs have non-skid backing?: yes All stairs or steps have railings?: yes Grab bars in the bathtub or shower?: yes Have non-skid surface in bathtub or shower?: yes Good home lighting?: yes Regular seat belt use?: yes Hospital stays in the last year:: (!) yes How many hospital stays:: 8 Reason: surgery  Cognitive Assessment Difficulty concentrating, remembering, or making decisions? : no Will 6CIT or Mini Cog be Completed: yes What year is it?: 0 points What month is it?: 0 points Give patient an address phrase to remember (5 components): [address] [place] About what time is it?: 0 points Count backwards from 20 to 1: 0 points Say the months of the year in reverse: 0 points Repeat the address phrase from earlier: 0 points 6 CIT Score: 0 points  Advance Directives (For Healthcare) Does Patient Have a Medical Advance Directive?: Yes Does patient want to make changes to medical advance directive?: No - Patient declined Type of Advance Directive: Healthcare Power of [place]; Living will Copy of Healthcare Power of Attorney in Chart?: No - copy requested Copy of Living Will in Chart?: No - copy requested Would patient like information on creating a medical advance directive?: No - Patient declined  Reviewed/Updated  Reviewed/Updated: Reviewed All (Medical, Surgical, Family, Medications, Allergies, Care Teams, Patient Goals)    Allergies (verified) Patient has no known allergies.   Current Medications (  verified) Outpatient Encounter Medications as of 09/07/2024  Medication  Sig   Accu-Chek Softclix Lancets lancets CHECK GLUCOSE 4 TIMES DAILY Dx E11.9   amLODipine  (NORVASC ) 10 MG tablet Take 1 tablet (10 mg total) by mouth daily.   aspirin  EC 81 MG tablet Take 81 mg by mouth at bedtime.   Blood Glucose Monitoring Suppl (ACCU-CHEK GUIDE ME) w/Device KIT CHECK GLUCOSE 4 TIMES DAILY Dx E11.9   fish oil-omega-3 fatty acids  1000 MG capsule Take 1 g by mouth 2 (two) times daily.   glucose blood (ACCU-CHEK GUIDE TEST) test strip CHECK GLUCOSE 4 TIMES DAILY Dx E11.9   hydrochlorothiazide  (HYDRODIURIL ) 25 MG tablet Take 1 tablet (25 mg total) by mouth daily.   metFORMIN  (GLUCOPHAGE ) 1000 MG tablet Take 1 tablet (1,000 mg total) by mouth 2 (two) times daily with a meal. (NEEDS TO BE SEEN BEFORE NEXT REFILL)   pantoprazole  (PROTONIX ) 40 MG tablet Take 1 tablet (40 mg total) by mouth 2 (two) times daily before a meal.   rosuvastatin  (CRESTOR ) 10 MG tablet Take 1 tablet (10 mg total) by mouth at bedtime.   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 larynx; Grade 3;  MAC, 4; Oral; 7.5 mm; Cuffed,  Stylet;   GERD (gastroesophageal reflux disease)    on meds   Hyperlipidemia    on meds   Hypertension    on meds   Internal hemorrhoids    Tubulovillous adenoma of colon 2009   Vitamin D  deficiency    Past Surgical History:  Procedure Laterality Date   BILIARY STENT PLACEMENT  11/02/2023   Procedure: INSERTION, STENT, BILE DUCT;  Surgeon: Wilhelmenia Aloha Raddle., MD;  Location: [facility] ENDOSCOPY;  Service: Gastroenterology;;   BIOPSY OF SKIN SUBCUTANEOUS TISSUE AND/OR MUCOUS MEMBRANE  10/28/2023   Procedure: BIOPSY;  Surgeon: Avram Lupita BRAVO, MD;  Location: [facility]  ENDOSCOPY;  Service: Gastroenterology;;   CATARACT EXTRACTION, BILATERAL Bilateral    CHOLECYSTECTOMY N/A 10/29/2023   Procedure: LAPAROSCOPIC CHOLECYSTECTOMY WITH INTRAOPERATIVE CHOLANGIOGRAM;  Surgeon: Stevie Herlene Righter, MD;  Location: [facility] OR;  Service: General;  Laterality: N/A;   COLONOSCOPY  12/01/2013   COLONOSCOPY  2020   MS-MAC-suprep(good)-tics/hems/HPPx1/TAx4   COLONOSCOPY  04/22/2022   ERCP N/A 10/28/2023   Procedure: ERCP, WITH INTERVENTION IF INDICATED;  Surgeon: Avram Lupita BRAVO, MD;  Location: [facility] ENDOSCOPY;  Service: Gastroenterology;  Laterality: N/A;   ERCP N/A 11/02/2023   Procedure: ERCP, WITH INTERVENTION IF INDICATED;  Surgeon: Wilhelmenia Aloha Raddle., MD;  Location: [facility] ENDOSCOPY;  Service: Gastroenterology;  Laterality: N/A;   ERCP N/A 01/07/2024   Procedure: ERCP, WITH INTERVENTION IF INDICATED;  Surgeon: Wilhelmenia Aloha Raddle., MD;  Location: [facility] ENDOSCOPY;  Service: Gastroenterology;  Laterality: N/A;   ESOPHAGOGASTRODUODENOSCOPY N/A 10/28/2023   Procedure: EGD (ESOPHAGOGASTRODUODENOSCOPY);  Surgeon: Avram Lupita BRAVO, MD;  Location: [facility] ENDOSCOPY;  Service: Gastroenterology;  Laterality: N/A;   ESOPHAGOGASTRODUODENOSCOPY N/A 01/07/2024   Procedure: EGD (ESOPHAGOGASTRODUODENOSCOPY);  Surgeon: Wilhelmenia Aloha Raddle., MD;  Location: [facility] ENDOSCOPY;  Service: Gastroenterology;  Laterality: N/A;   FOREIGN BODY REMOVAL  01/07/2024   Procedure: REMOVAL, FOREIGN BODY;  Surgeon: Wilhelmenia Aloha Raddle., MD;  Location: [facility] ENDOSCOPY;  Service: Gastroenterology;;   HEMORRHOID SURGERY  1978   PANCREATIC STENT PLACEMENT  10/28/2023   Procedure: INSERTION, STENT, PANCREATIC DUCT;  Surgeon: Avram Lupita BRAVO, MD;  Location: [facility] ENDOSCOPY;  Service: Gastroenterology;;   POLYPECTOMY  2020   HPPx1/TAx4   STENT REMOVAL  01/07/2024   Procedure: STENT REMOVAL;  Surgeon: Wilhelmenia Aloha Raddle., MD;  Location: [facility];  Service: Gastroenterology;;   STONE EXTRACTION WITH BASKET  11/02/2023    Procedure: ERCP, WITH LITHROTRIPSY OR REMOVAL OF COMMON BILE DUCT CALCULUS;  Surgeon: Mansouraty, Aloha Raddle., MD;  Location: [facility] ENDOSCOPY;  Service: Gastroenterology;;   UPPER GASTROINTESTINAL ENDOSCOPY     WISDOM TOOTH EXTRACTION     Family History  Problem Relation Age of Onset   Diabetes Mother    Hypertension Mother    Stroke Mother 56   Diabetes Father    Hypertension Father    Heart attack Father 31   Colon cancer Neg Hx    Colon polyps Neg Hx    Esophageal cancer Neg Hx    Stomach cancer Neg Hx    Rectal cancer Neg Hx    Social History   Occupational History   Occupation: Retired    Associate Professor: [employer]  Tobacco Use   Smoking status: Former    Current packs/day: 0.00    Average packs/day: 2.0 packs/day for 40.0 years (80.0 ttl pk-yrs)    Types: Cigarettes    Start date: 11/09/1965    Quit date: 11/09/2005    Years since quitting: 18.8   Smokeless tobacco: Never  Vaping Use   Vaping status: Never Used  Substance and Sexual Activity   Alcohol use: No   Drug use: No   Sexual activity: Not Currently   Tobacco Counseling Counseling given: Yes  SDOH Screenings   Food Insecurity: No Food Insecurity (09/07/2024)  Housing: Low Risk (09/07/2024)  Transportation Needs: No Transportation Needs (09/07/2024)  Utilities: Not At Risk (09/07/2024)  Alcohol Screen: Low Risk (09/07/2023)  Depression (PHQ2-9): Low Risk (09/07/2024)  Financial Resource Strain: Low Risk (09/07/2023)  Physical Activity: Sufficiently Active (09/07/2024)  Social Connections: Moderately Integrated (09/07/2024)  Stress: No Stress Concern Present (09/07/2024)  Tobacco Use: Medium Risk (09/07/2024)  Health Literacy: Adequate Health Literacy (09/07/2024)   See flowsheets for full screening details  Depression Screen PHQ 2 & 9 Depression Scale- Over the past 2 weeks, how often have you been bothered by any of the following problems? Little interest or pleasure in doing things: 0 Feeling down, depressed,  or hopeless (PHQ Adolescent also includes...irritable): 0 PHQ-2 Total Score: 0 Trouble falling or staying asleep, or sleeping too much: 1 Feeling tired or having little energy: 1 Poor appetite or overeating (PHQ Adolescent also includes...weight loss): 0 Feeling bad about yourself - or that you are a failure or have let yourself or your family down: 0 Trouble concentrating on things, such as reading the newspaper or watching television (PHQ Adolescent also includes...like school work): 0 Moving or speaking so slowly that other people could have noticed. Or the opposite - being so fidgety or restless that you have been moving around a lot more than usual: 0 Thoughts that you would be better off dead, or of hurting yourself in some way: 0 PHQ-9 Total Score: 3 If you checked off any problems, how difficult have these problems made it for you to do your work, take care of things at home, or get along with other people?: Somewhat difficult     Goals Addressed  This Visit's Progress    Eat more fruits and vegetables   On track    Increase vegetables servings to 3-5 per day       Remain active and independent   On track            Objective:    Today's Vitals   09/07/24 1147  BP: 130/70  Pulse: 62  Weight: 206 lb (93.4 kg)  Height: 6' 1 (1.854 m)   Body mass index is 27.18 kg/m.  Hearing/Vision screen No results found. Immunizations and Health Maintenance Health Maintenance  Topic Date Due   Zoster Vaccines- Shingrix (1 of 2) 03/04/1998   COVID-19 Vaccine (4 - 2025-26 season) 04/11/2024   Diabetic kidney evaluation - Urine ACR  06/11/2024   OPHTHALMOLOGY EXAM  07/06/2024   HEMOGLOBIN A1C  09/17/2024   FOOT EXAM  12/09/2024   Diabetic kidney evaluation - eGFR measurement  03/17/2025   Colonoscopy  04/22/2025   Medicare Annual Wellness (AWV)  09/07/2025   DTaP/Tdap/Td (3 - Tdap) 12/05/2028   Pneumococcal Vaccine: 50+ Years  Completed   Influenza Vaccine   Completed   Hepatitis C Screening  Completed   Meningococcal B Vaccine  Aged Out        Assessment/Plan:  This is a routine wellness examination for Jorge Garcia.  Patient Care Team: Gladis Mustard, FNP as PCP - General (Family Medicine) Clinic, [facility]  I have personally reviewed and noted the following in the patients chart:   Medical and social history Use of alcohol, tobacco or illicit drugs  Current medications and supplements including opioid prescriptions. Functional ability and status Nutritional status Physical activity Advanced directives List of other physicians Hospitalizations, surgeries, and ER visits in previous 12 months Vitals Screenings to include cognitive, depression, and falls Referrals and appointments  No orders of the defined types were placed in this encounter.  In addition, I have reviewed and discussed with patient certain preventive protocols, quality metrics, and best practice recommendations. A written personalized care plan for preventive services as well as general preventive health recommendations were provided to patient.   Jorge Garcia, CMA   09/07/2024   Return in 1 year (on 09/07/2025).  After Visit Summary: (MyChart) Due to this being a telephonic visit, the after visit summary with patients personalized plan was offered to patient via MyChart   Nurse Notes: Pt is aware and due the following: Covid and shingles vaccines--when decide, will get at pharmacy, pt is aware that he is due diabetic eye exam and UrineACR "

## 2024-09-07 NOTE — Patient Instructions (Signed)
 Jorge Garcia,  Thank you for taking the time for your Medicare Wellness Visit. I appreciate your continued commitment to your health goals. Please review the care plan we discussed, and feel free to reach out if I can assist you further.  Please note that Annual Wellness Visits do not include a physical exam. Some assessments may be limited, especially if the visit was conducted virtually. If needed, we may recommend an in-person follow-up with your provider.  Ongoing Care Seeing your primary care provider every 3 to 6 months helps us  monitor your health and provide consistent, personalized care.   Referrals If a referral was made during today's visit and you haven't received any updates within two weeks, please contact the referred provider directly to check on the status.  Recommended Screenings:  Health Maintenance  Topic Date Due   Zoster (Shingles) Vaccine (1 of 2) 03/04/1998   COVID-19 Vaccine (4 - 2025-26 season) 04/11/2024   Kidney health urinalysis for diabetes  06/11/2024   Eye exam for diabetics  07/06/2024   Medicare Annual Wellness Visit  09/06/2024   Hemoglobin A1C  09/17/2024   Complete foot exam   12/09/2024   Yearly kidney function blood test for diabetes  03/17/2025   Colon Cancer Screening  04/22/2025   DTaP/Tdap/Td vaccine (3 - Tdap) 12/05/2028   Pneumococcal Vaccine for age over 57  Completed   Flu Shot  Completed   Hepatitis C Screening  Completed   Meningitis B Vaccine  Aged Out       09/07/2024    8:44 AM  Advanced Directives  Does Patient Have a Medical Advance Directive? Yes  Type of Estate Agent of [place];Living will  Copy of Healthcare Power of Attorney in Chart? No - copy requested    Vision: Annual vision screenings are recommended for early detection of glaucoma, cataracts, and diabetic retinopathy. These exams can also reveal signs of chronic conditions such as diabetes and high blood pressure.  Dental: Annual dental  screenings help detect early signs of oral cancer, gum disease, and other conditions linked to overall health, including heart disease and diabetes.  Please see the attached documents for additional preventive care recommendations.

## 2024-09-15 ENCOUNTER — Encounter: Payer: Self-pay | Admitting: Nurse Practitioner

## 2024-09-15 ENCOUNTER — Ambulatory Visit: Payer: Self-pay | Admitting: Nurse Practitioner

## 2024-09-15 VITALS — BP 128/69 | HR 66 | Ht 73.0 in | Wt 216.0 lb

## 2024-09-15 DIAGNOSIS — E785 Hyperlipidemia, unspecified: Secondary | ICD-10-CM

## 2024-09-15 DIAGNOSIS — I10 Essential (primary) hypertension: Secondary | ICD-10-CM

## 2024-09-15 DIAGNOSIS — E1169 Type 2 diabetes mellitus with other specified complication: Secondary | ICD-10-CM

## 2024-09-15 DIAGNOSIS — E119 Type 2 diabetes mellitus without complications: Secondary | ICD-10-CM

## 2024-09-15 DIAGNOSIS — Z6829 Body mass index (BMI) 29.0-29.9, adult: Secondary | ICD-10-CM

## 2024-09-15 DIAGNOSIS — K219 Gastro-esophageal reflux disease without esophagitis: Secondary | ICD-10-CM

## 2024-09-15 DIAGNOSIS — Z7984 Long term (current) use of oral hypoglycemic drugs: Secondary | ICD-10-CM

## 2024-09-15 LAB — CBC WITH DIFFERENTIAL/PLATELET
Basophils Absolute: 0.1 10*3/uL (ref 0.0–0.2)
Basos: 1 %
EOS (ABSOLUTE): 0.4 10*3/uL (ref 0.0–0.4)
Eos: 5 %
Hematocrit: 38.7 % (ref 37.5–51.0)
Hemoglobin: 12.8 g/dL — ABNORMAL LOW (ref 13.0–17.7)
Immature Grans (Abs): 0.1 10*3/uL (ref 0.0–0.1)
Immature Granulocytes: 1 %
Lymphocytes Absolute: 2 10*3/uL (ref 0.7–3.1)
Lymphs: 25 %
MCH: 27.8 pg (ref 26.6–33.0)
MCHC: 33.1 g/dL (ref 31.5–35.7)
MCV: 84 fL (ref 79–97)
Monocytes Absolute: 0.7 10*3/uL (ref 0.1–0.9)
Monocytes: 9 %
Neutrophils Absolute: 4.7 10*3/uL (ref 1.4–7.0)
Neutrophils: 59 %
Platelets: 285 10*3/uL (ref 150–450)
RBC: 4.61 x10E6/uL (ref 4.14–5.80)
RDW: 14.2 % (ref 11.6–15.4)
WBC: 7.9 10*3/uL (ref 3.4–10.8)

## 2024-09-15 LAB — CMP14+EGFR
ALT: 13 [IU]/L (ref 0–44)
AST: 18 [IU]/L (ref 0–40)
Albumin: 4.3 g/dL (ref 3.8–4.8)
Alkaline Phosphatase: 58 [IU]/L (ref 47–123)
BUN/Creatinine Ratio: 19 (ref 10–24)
BUN: 25 mg/dL (ref 8–27)
Bilirubin Total: 2.1 mg/dL — ABNORMAL HIGH (ref 0.0–1.2)
CO2: 19 mmol/L — ABNORMAL LOW (ref 20–29)
Calcium: 8.6 mg/dL (ref 8.6–10.2)
Chloride: 104 mmol/L (ref 96–106)
Creatinine, Ser: 1.35 mg/dL — ABNORMAL HIGH (ref 0.76–1.27)
Globulin, Total: 2.7 g/dL (ref 1.5–4.5)
Glucose: 149 mg/dL — ABNORMAL HIGH (ref 70–99)
Potassium: 5.9 mmol/L (ref 3.5–5.2)
Sodium: 141 mmol/L (ref 134–144)
Total Protein: 7 g/dL (ref 6.0–8.5)
eGFR: 54 mL/min/{1.73_m2} — ABNORMAL LOW

## 2024-09-15 LAB — LIPID PANEL
Chol/HDL Ratio: 2.3 ratio (ref 0.0–5.0)
Cholesterol, Total: 88 mg/dL — ABNORMAL LOW (ref 100–199)
HDL: 39 mg/dL — ABNORMAL LOW
LDL Chol Calc (NIH): 30 mg/dL (ref 0–99)
Triglycerides: 101 mg/dL (ref 0–149)
VLDL Cholesterol Cal: 19 mg/dL (ref 5–40)

## 2024-09-15 LAB — BAYER DCA HB A1C WAIVED: HB A1C (BAYER DCA - WAIVED): 7.6 % — ABNORMAL HIGH (ref 4.8–5.6)

## 2024-09-15 MED ORDER — HYDROCHLOROTHIAZIDE 25 MG PO TABS: 25.0000 mg | ORAL_TABLET | Freq: Every day | ORAL | 1 refills | Status: AC

## 2024-09-15 MED ORDER — PANTOPRAZOLE SODIUM 40 MG PO TBEC: 40.0000 mg | DELAYED_RELEASE_TABLET | Freq: Two times a day (BID) | ORAL | 6 refills | Status: AC

## 2024-09-15 MED ORDER — AMLODIPINE BESYLATE 10 MG PO TABS: 10.0000 mg | ORAL_TABLET | Freq: Every day | ORAL | 1 refills | Status: AC

## 2024-09-15 MED ORDER — ROSUVASTATIN CALCIUM 10 MG PO TABS: 10.0000 mg | ORAL_TABLET | Freq: Every evening | ORAL | 1 refills | Status: AC

## 2024-09-15 MED ORDER — METFORMIN HCL 1000 MG PO TABS: 1000.0000 mg | ORAL_TABLET | Freq: Two times a day (BID) | ORAL | 1 refills | Status: AC

## 2024-09-15 NOTE — Patient Instructions (Signed)
 Diabetes and Foot Care: What to Know Diabetes, also called diabetes mellitus, may cause problems with your feet and legs because of poor blood flow (circulation). Poor circulation may make your skin: Become thinner and drier. Break more easily. Heal more slowly. Peel and crack. You may also have nerve damage (neuropathy). This can cause decreased feeling in your legs and feet. This means that you may not notice minor injuries to your feet that could lead to more serious problems. Finding and treating problems early is the best way to prevent future foot problems. How to care for your feet Foot hygiene  Wash your feet daily with warm water  and mild soap. Do not use hot water . Then, pat your feet and the areas between your toes until they are fully dry. Do not soak your feet. This can dry your skin. Trim your toenails straight across. Do not dig under them or around the cuticle. File the edges of your nails with an emery board or nail file. Apply a moisturizing lotion or petroleum jelly to the skin on your feet and to dry, brittle toenails. Use lotion that does not contain alcohol and is unscented. Do not apply lotion between your toes. Shoes and socks Wear clean socks or stockings every day. Make sure they are not too tight. Do not wear knee-high stockings. These may decrease blood flow to your legs. Wear shoes that fit well and have enough cushioning. Always look in your shoes before you put them on to be sure there are no objects inside. To break in new shoes, wear them for just a few hours a day. This prevents injuries on your feet. Wounds, scrapes, corns, and calluses  Check your feet daily for blisters, cuts, bruises, sores, and redness. If you cannot see the bottom of your feet, use a mirror or ask someone for help. Do not cut off corns or calluses or try to remove them with medicine. If you find a minor scrape, cut, or break in the skin on your feet, keep it and the skin around it clean  and dry. You may clean these areas with mild soap and water . Do not clean the area with peroxide, alcohol, or iodine . If you have a wound, scrape, corn, or callus on your foot, look at it several times a day to make sure it is healing and not infected. Check for: Redness, swelling, or pain. Fluid or blood. Warmth. Pus or a bad smell. General tips Do not cross your legs. This may decrease blood flow to your feet. Do not use heating pads or hot water  bottles on your feet. They may burn your skin. If you have lost feeling in your feet or legs, you may not know this is happening until it is too late. Protect your feet from hot and cold by wearing shoes, such as at the beach or on hot pavement. Schedule a complete foot exam at least once a year or more often if you have foot problems. Report any cuts, sores, or bruises to your health care provider right away. Where to find more information American Diabetes Association: diabetes.org Association of Diabetes Care & Education Specialists: diabeteseducator.org Contact a health care provider if: You have a condition that increases your risk of infection, and you have any cuts, sores, or bruises on your feet. You have an injury that is not healing. You have redness on your legs or feet. You feel burning or tingling in your legs or feet. You have pain or cramps in  your legs and feet. Your legs or feet are numb. Your feet always feel cold. You have pain around any toenails. Get help right away if: You have a wound, scrape, corn, or callus on your foot and: You have signs of infection. You have a fever. You have a red line going up your leg. This information is not intended to replace advice given to you by your health care provider. Make sure you discuss any questions you have with your health care provider. Document Revised: 06/03/2024 Document Reviewed: 01/29/2022 Elsevier Patient Education  2025 [employer].

## 2024-09-15 NOTE — Progress Notes (Signed)
 "  Subjective:    Patient ID: Jorge Garcia, male    DOB: 15-Apr-1946, 79 y.o.   MRN: [id number]   Chief Complaint: annual physical    HPI:  Jorge Garcia is a 79 y.o. who identifies as a male who was assigned male at birth.   Social history: Lives with: by hisself Work history: retired   Water Engineer in today for follow up of the following chronic medical issues:  1. Primary hypertension No c/o chest pain, sob or headache. Does not check blood pressure at home. Is on norvasc  and hydrochlorothiazide  daily BP Readings from Last 3 Encounters:  09/07/24 130/70  09/02/24 (!) 117/54  03/17/24 130/70     2. Diabetes mellitus treated with oral medication (HCC) Very seldom checks his blood sugars Is on metformin  daily Lab Results  Component Value Date   HGBA1C 7.5 (H) 03/17/2024     3. Hyperlipidemia associated with type 2 diabetes mellitus (HCC) Does not watch diet and does no exercise. Takes crestor  daily Lab Results  Component Value Date   CHOL 111 03/17/2024   HDL 50 03/17/2024   LDLCALC 48 03/17/2024   TRIG 59 03/17/2024   CHOLHDL 2.2 03/17/2024     4. Gastroesophageal reflux disease without esophagitis Is on omperazole and is doing well. Had gastroscopy 2 weeks ago.  5. BMI 29.0-29.9,adult Weight is up 6lbs  Wt Readings from Last 3 Encounters:  09/07/24 206 lb (93.4 kg)  09/02/24 225 lb 6.4 oz (102.2 kg)  08/19/24 225 lb 6.4 oz (102.2 kg)   BMI Readings from Last 3 Encounters:  09/07/24 27.18 kg/m  09/02/24 29.74 kg/m  08/19/24 29.74 kg/m       New complaints: None today  No Known Allergies Outpatient Encounter Medications as of 09/15/2024  Medication Sig   Accu-Chek Softclix Lancets lancets CHECK GLUCOSE 4 TIMES DAILY Dx E11.9   amLODipine  (NORVASC ) 10 MG tablet Take 1 tablet (10 mg total) by mouth daily.   aspirin  EC 81 MG tablet Take 81 mg by mouth at bedtime.   Blood Glucose Monitoring Suppl (ACCU-CHEK GUIDE ME) w/Device KIT CHECK GLUCOSE 4  TIMES DAILY Dx E11.9   fish oil-omega-3 fatty acids  1000 MG capsule Take 1 g by mouth 2 (two) times daily.   glucose blood (ACCU-CHEK GUIDE TEST) test strip CHECK GLUCOSE 4 TIMES DAILY Dx E11.9   hydrochlorothiazide  (HYDRODIURIL ) 25 MG tablet Take 1 tablet (25 mg total) by mouth daily.   metFORMIN  (GLUCOPHAGE ) 1000 MG tablet Take 1 tablet (1,000 mg total) by mouth 2 (two) times daily with a meal. (NEEDS TO BE SEEN BEFORE NEXT REFILL)   pantoprazole  (PROTONIX ) 40 MG tablet Take 1 tablet (40 mg total) by mouth 2 (two) times daily before a meal.   rosuvastatin  (CRESTOR ) 10 MG tablet Take 1 tablet (10 mg total) by mouth at bedtime.   Facility-Administered Encounter Medications as of 09/15/2024  Medication   cyanocobalamin  (VITAMIN B12) injection 1,000 mcg    Past Surgical History:  Procedure Laterality Date   BILIARY STENT PLACEMENT  11/02/2023   Procedure: INSERTION, STENT, BILE DUCT;  Surgeon: Wilhelmenia Aloha Raddle., MD;  Location: [facility] ENDOSCOPY;  Service: Gastroenterology;;   BIOPSY OF SKIN SUBCUTANEOUS TISSUE AND/OR MUCOUS MEMBRANE  10/28/2023   Procedure: BIOPSY;  Surgeon: Avram Lupita BRAVO, MD;  Location: [facility] ENDOSCOPY;  Service: Gastroenterology;;   CATARACT EXTRACTION, BILATERAL Bilateral    CHOLECYSTECTOMY N/A 10/29/2023   Procedure: LAPAROSCOPIC CHOLECYSTECTOMY WITH INTRAOPERATIVE CHOLANGIOGRAM;  Surgeon: Stevie Herlene Righter, MD;  Location: [facility] OR;  Service: General;  Laterality: N/A;   COLONOSCOPY  12/01/2013   COLONOSCOPY  2020   MS-MAC-suprep(good)-tics/hems/HPPx1/TAx4   COLONOSCOPY  04/22/2022   ERCP N/A 10/28/2023   Procedure: ERCP, WITH INTERVENTION IF INDICATED;  Surgeon: Avram Lupita BRAVO, MD;  Location: [facility] ENDOSCOPY;  Service: Gastroenterology;  Laterality: N/A;   ERCP N/A 11/02/2023   Procedure: ERCP, WITH INTERVENTION IF INDICATED;  Surgeon: Wilhelmenia Aloha Raddle., MD;  Location: [facility] ENDOSCOPY;  Service: Gastroenterology;  Laterality: N/A;   ERCP N/A 01/07/2024   Procedure: ERCP,  WITH INTERVENTION IF INDICATED;  Surgeon: Wilhelmenia Aloha Raddle., MD;  Location: [facility] ENDOSCOPY;  Service: Gastroenterology;  Laterality: N/A;   ESOPHAGOGASTRODUODENOSCOPY N/A 10/28/2023   Procedure: EGD (ESOPHAGOGASTRODUODENOSCOPY);  Surgeon: Avram Lupita BRAVO, MD;  Location: [facility] ENDOSCOPY;  Service: Gastroenterology;  Laterality: N/A;   ESOPHAGOGASTRODUODENOSCOPY N/A 01/07/2024   Procedure: EGD (ESOPHAGOGASTRODUODENOSCOPY);  Surgeon: Wilhelmenia Aloha Raddle., MD;  Location: [facility];  Service: Gastroenterology;  Laterality: N/A;   FOREIGN BODY REMOVAL  01/07/2024   Procedure: REMOVAL, FOREIGN BODY;  Surgeon: Wilhelmenia, Aloha Raddle., MD;  Location: [facility] ENDOSCOPY;  Service: Gastroenterology;;   HEMORRHOID SURGERY  1978   PANCREATIC STENT PLACEMENT  10/28/2023   Procedure: INSERTION, STENT, PANCREATIC DUCT;  Surgeon: Avram Lupita BRAVO, MD;  Location: [facility] ENDOSCOPY;  Service: Gastroenterology;;   POLYPECTOMY  2020   HPPx1/TAx4   STENT REMOVAL  01/07/2024   Procedure: STENT REMOVAL;  Surgeon: Wilhelmenia Aloha Raddle., MD;  Location: [facility];  Service: Gastroenterology;;   STONE EXTRACTION WITH BASKET  11/02/2023   Procedure: ERCP, WITH LITHROTRIPSY OR REMOVAL OF COMMON BILE DUCT CALCULUS;  Surgeon: Mansouraty, Aloha Raddle., MD;  Location: [facility] ENDOSCOPY;  Service: Gastroenterology;;   UPPER GASTROINTESTINAL ENDOSCOPY     WISDOM TOOTH EXTRACTION      Family History  Problem Relation Age of Onset   Diabetes Mother    Hypertension Mother    Stroke Mother 38   Diabetes Father    Hypertension Father    Heart attack Father 43   Colon cancer Neg Hx    Colon polyps Neg Hx    Esophageal cancer Neg Hx    Stomach cancer Neg Hx    Rectal cancer Neg Hx       Controlled substance contract: n/a     Review of Systems  Constitutional:  Negative for diaphoresis.  Eyes:  Negative for pain.  Respiratory:  Negative for shortness of breath.   Cardiovascular:  Negative for chest pain, palpitations and leg  swelling.  Gastrointestinal:  Negative for abdominal pain.  Endocrine: Negative for polydipsia.  Skin:  Negative for rash.  Neurological:  Negative for dizziness, weakness and headaches.  Hematological:  Does not bruise/bleed easily.  All other systems reviewed and are negative.      Objective:   Physical Exam Vitals and nursing note reviewed.  Constitutional:      Appearance: Normal appearance. He is well-developed.  HENT:     Head: Normocephalic.     Nose: Nose normal.     Mouth/Throat:     Mouth: Mucous membranes are moist.     Pharynx: Oropharynx is clear.  Eyes:     Pupils: Pupils are equal, round, and reactive to light.  Neck:     Thyroid : No thyroid  mass or thyromegaly.     Vascular: No carotid bruit or JVD.     Trachea: Phonation normal.  Cardiovascular:     Rate and Rhythm: Normal rate and regular rhythm.  Pulmonary:     Effort:  Pulmonary effort is normal. No respiratory distress.     Breath sounds: Normal breath sounds.  Abdominal:     General: Bowel sounds are normal.     Palpations: Abdomen is soft.     Tenderness: There is no abdominal tenderness.  Musculoskeletal:        General: Normal range of motion.     Cervical back: Normal range of motion and neck supple.  Lymphadenopathy:     Cervical: No cervical adenopathy.  Skin:    General: Skin is warm and dry.  Neurological:     Mental Status: He is alert and oriented to person, place, and time.  Psychiatric:        Behavior: Behavior normal.        Thought Content: Thought content normal.        Judgment: Judgment normal.    BP 128/69   Pulse 66   Ht 6' 1 (1.854 m)   Wt 216 lb (98 kg)   SpO2 95%   BMI 28.50 kg/m    Hgba1c 7.5%     Assessment & Plan:   Laron Boorman comes in today with chief complaint of annual physical  Diagnosis and orders addressed:  1. Primary hypertension Low sodium diet - CBC with Differential/Platelet - CMP14+EGFR - amLODipine  (NORVASC ) 10 MG tablet; Take 1  tablet (10 mg total) by mouth daily.  Dispense: 90 tablet; Refill: 1 - benazepril  (LOTENSIN ) 40 MG tablet; Take 1 tablet (40 mg total) by mouth daily.  Dispense: 90 tablet; Refill: 1 - hydrochlorothiazide  (HYDRODIURIL ) 25 MG tablet; Take 1 tablet (25 mg total) by mouth daily.  Dispense: 90 tablet; Refill: 1  2. Diabetes mellitus treated with oral medication (HCC) Continue to watch carbs in diet - Bayer DCA Hb A1c Waived - Microalbumin / creatinine urine ratio - metFORMIN  (GLUCOPHAGE ) 1000 MG tablet; Take 1 tablet (1,000 mg total) by mouth 2 (two) times daily with a meal. (NEEDS TO BE SEEN BEFORE NEXT REFILL)  Dispense: 180 tablet; Refill: 1  3. Hyperlipidemia associated with type 2 diabetes mellitus (HCC) Low fat diet - Lipid panel - rosuvastatin  (CRESTOR ) 10 MG tablet; Take 1 tablet (10 mg total) by mouth daily.  Dispense: 90 tablet; Refill: 1  4. Gastroesophageal reflux disease without esophagitis Avoid spicy foods Do not eat 2 hours prior to bedtime - omeprazole  (PRILOSEC) 40 MG capsule; Take 1 capsule (40 mg total) by mouth daily.  Dispense: 90 capsule; Refill: 1  5. BMI 29.0-29.9,adult Discussed diet and exercise for person with BMI >25 Will recheck weight in 3-6 months    Labs pending Health Maintenance reviewed Diet and exercise encouraged  Follow up plan: 6 months   Mary-Margaret Lunger, FNP  "

## 2024-09-16 ENCOUNTER — Ambulatory Visit: Payer: Self-pay | Admitting: Nurse Practitioner

## 2024-09-16 ENCOUNTER — Telehealth: Payer: Self-pay | Admitting: Family Medicine

## 2024-09-16 ENCOUNTER — Telehealth: Payer: Self-pay

## 2024-09-16 LAB — MICROALBUMIN / CREATININE URINE RATIO
Creatinine, Urine: 253.7 mg/dL
Microalb/Creat Ratio: 13 mg/g{creat} (ref 0–29)
Microalbumin, Urine: 33.3 ug/mL

## 2024-09-16 NOTE — Telephone Encounter (Unsigned)
 Copied from CRM [id number]. Topic: Clinical - Lab/Test Results >> Sep 16, 2024 11:47 AM Emylou G wrote: Reason for CRM: relayed patients labs.. unsure if you needed anything on potassium.SABRA Belton to call him back

## 2024-09-16 NOTE — Telephone Encounter (Signed)
 Pt is aware of results and has no concerns. Pt states that he believes that the lab used a straight needle and not a butterfly.

## 2024-09-16 NOTE — Telephone Encounter (Signed)
 Misha from [facility] calling with critical lab results, Potassium 5.9.

## 2024-10-03 ENCOUNTER — Ambulatory Visit

## 2024-10-13 ENCOUNTER — Ambulatory Visit: Admitting: Nurse Practitioner

## 2024-12-12 ENCOUNTER — Ambulatory Visit: Admitting: Nurse Practitioner

## 2025-09-08 ENCOUNTER — Ambulatory Visit
